# Patient Record
Sex: Female | Born: 1960
Health system: Southern US, Community
[De-identification: ages and names within clinical notes are randomized; demographics above are authoritative.]

## PROBLEM LIST (undated history)

## (undated) ENCOUNTER — Ambulatory Visit: Admission: EM | Payer: Medicare HMO | Source: Home / Self Care

## (undated) ENCOUNTER — Emergency Department (HOSPITAL_COMMUNITY): Payer: Medicare HMO

## (undated) DIAGNOSIS — M419 Scoliosis, unspecified: Secondary | ICD-10-CM

## (undated) DIAGNOSIS — F32A Depression, unspecified: Secondary | ICD-10-CM

## (undated) DIAGNOSIS — R51 Headache: Secondary | ICD-10-CM

## (undated) DIAGNOSIS — R519 Headache, unspecified: Secondary | ICD-10-CM

## (undated) DIAGNOSIS — M1990 Unspecified osteoarthritis, unspecified site: Secondary | ICD-10-CM

## (undated) DIAGNOSIS — I499 Cardiac arrhythmia, unspecified: Secondary | ICD-10-CM

## (undated) DIAGNOSIS — C801 Malignant (primary) neoplasm, unspecified: Secondary | ICD-10-CM

## (undated) DIAGNOSIS — F329 Major depressive disorder, single episode, unspecified: Secondary | ICD-10-CM

## (undated) DIAGNOSIS — C50919 Malignant neoplasm of unspecified site of unspecified female breast: Secondary | ICD-10-CM

## (undated) DIAGNOSIS — D649 Anemia, unspecified: Secondary | ICD-10-CM

## (undated) DIAGNOSIS — Q433 Congenital malformations of intestinal fixation: Secondary | ICD-10-CM

## (undated) HISTORY — PX: ERCP: SHX60

## (undated) HISTORY — DX: Scoliosis, unspecified: M41.9

## (undated) HISTORY — PX: COLONOSCOPY: SHX174

## (undated) HISTORY — PX: ANKLE SURGERY: SHX546

## (undated) HISTORY — DX: Malignant (primary) neoplasm, unspecified: C80.1

## (undated) HISTORY — PX: EYE SURGERY: SHX253

## (undated) HISTORY — PX: TUBAL LIGATION: SHX77

## (undated) HISTORY — PX: DILATION AND CURETTAGE OF UTERUS: SHX78

## (undated) HISTORY — PX: BREAST SURGERY: SHX581

## (undated) HISTORY — PX: BREAST EXCISIONAL BIOPSY: SUR124

---

## 1997-09-05 HISTORY — PX: CERVICAL DISC SURGERY: SHX588

## 1998-07-02 ENCOUNTER — Encounter: Payer: Self-pay | Admitting: Neurosurgery

## 1998-07-03 ENCOUNTER — Encounter: Payer: Self-pay | Admitting: Neurosurgery

## 1998-07-03 ENCOUNTER — Ambulatory Visit (HOSPITAL_COMMUNITY): Admission: RE | Admit: 1998-07-03 | Discharge: 1998-07-04 | Payer: Self-pay | Admitting: Neurosurgery

## 1998-09-05 HISTORY — PX: BACK SURGERY: SHX140

## 1998-10-27 ENCOUNTER — Ambulatory Visit (HOSPITAL_COMMUNITY): Admission: RE | Admit: 1998-10-27 | Discharge: 1998-10-27 | Payer: Self-pay | Admitting: Neurosurgery

## 1998-10-27 ENCOUNTER — Encounter: Payer: Self-pay | Admitting: Neurosurgery

## 1998-11-27 ENCOUNTER — Inpatient Hospital Stay (HOSPITAL_COMMUNITY): Admission: RE | Admit: 1998-11-27 | Discharge: 1998-11-30 | Payer: Self-pay | Admitting: Neurosurgery

## 1998-11-27 ENCOUNTER — Encounter: Payer: Self-pay | Admitting: Neurosurgery

## 1998-11-29 ENCOUNTER — Encounter: Payer: Self-pay | Admitting: Neurological Surgery

## 1999-06-15 ENCOUNTER — Encounter: Payer: Self-pay | Admitting: Neurosurgery

## 1999-06-15 ENCOUNTER — Ambulatory Visit (HOSPITAL_COMMUNITY): Admission: RE | Admit: 1999-06-15 | Discharge: 1999-06-15 | Payer: Self-pay | Admitting: Neurosurgery

## 2001-11-27 ENCOUNTER — Other Ambulatory Visit: Admission: RE | Admit: 2001-11-27 | Discharge: 2001-11-27 | Payer: Self-pay | Admitting: Internal Medicine

## 2001-11-28 ENCOUNTER — Ambulatory Visit (HOSPITAL_COMMUNITY): Admission: RE | Admit: 2001-11-28 | Discharge: 2001-11-28 | Payer: Self-pay | Admitting: Internal Medicine

## 2001-11-28 ENCOUNTER — Encounter: Payer: Self-pay | Admitting: Internal Medicine

## 2001-12-14 ENCOUNTER — Encounter: Payer: Self-pay | Admitting: Family Medicine

## 2001-12-14 ENCOUNTER — Ambulatory Visit (HOSPITAL_COMMUNITY): Admission: RE | Admit: 2001-12-14 | Discharge: 2001-12-14 | Payer: Self-pay | Admitting: Family Medicine

## 2002-10-08 ENCOUNTER — Ambulatory Visit (HOSPITAL_COMMUNITY): Admission: RE | Admit: 2002-10-08 | Discharge: 2002-10-08 | Payer: Self-pay | Admitting: Family Medicine

## 2002-10-08 ENCOUNTER — Encounter: Payer: Self-pay | Admitting: Family Medicine

## 2002-12-24 ENCOUNTER — Ambulatory Visit (HOSPITAL_COMMUNITY): Admission: RE | Admit: 2002-12-24 | Discharge: 2002-12-24 | Payer: Self-pay | Admitting: Internal Medicine

## 2002-12-24 ENCOUNTER — Encounter: Payer: Self-pay | Admitting: Internal Medicine

## 2003-01-16 ENCOUNTER — Encounter: Payer: Self-pay | Admitting: Family Medicine

## 2003-01-16 ENCOUNTER — Ambulatory Visit (HOSPITAL_COMMUNITY): Admission: RE | Admit: 2003-01-16 | Discharge: 2003-01-16 | Payer: Self-pay | Admitting: Family Medicine

## 2003-04-02 ENCOUNTER — Ambulatory Visit (HOSPITAL_COMMUNITY): Admission: RE | Admit: 2003-04-02 | Discharge: 2003-04-02 | Payer: Self-pay | Admitting: Internal Medicine

## 2003-04-02 ENCOUNTER — Encounter (INDEPENDENT_AMBULATORY_CARE_PROVIDER_SITE_OTHER): Payer: Self-pay | Admitting: Internal Medicine

## 2003-04-09 ENCOUNTER — Ambulatory Visit (HOSPITAL_COMMUNITY): Admission: RE | Admit: 2003-04-09 | Discharge: 2003-04-09 | Payer: Self-pay | Admitting: Internal Medicine

## 2003-07-16 ENCOUNTER — Ambulatory Visit (HOSPITAL_COMMUNITY): Admission: RE | Admit: 2003-07-16 | Discharge: 2003-07-16 | Payer: Self-pay | Admitting: Internal Medicine

## 2003-08-06 ENCOUNTER — Ambulatory Visit (HOSPITAL_COMMUNITY): Admission: RE | Admit: 2003-08-06 | Discharge: 2003-08-06 | Payer: Self-pay | Admitting: Internal Medicine

## 2004-06-04 ENCOUNTER — Ambulatory Visit (HOSPITAL_COMMUNITY): Admission: RE | Admit: 2004-06-04 | Discharge: 2004-06-04 | Payer: Self-pay | Admitting: Family Medicine

## 2004-06-15 ENCOUNTER — Ambulatory Visit (HOSPITAL_COMMUNITY): Admission: RE | Admit: 2004-06-15 | Discharge: 2004-06-15 | Payer: Self-pay | Admitting: General Surgery

## 2004-07-20 ENCOUNTER — Ambulatory Visit (HOSPITAL_COMMUNITY): Admission: RE | Admit: 2004-07-20 | Discharge: 2004-07-20 | Payer: Self-pay | Admitting: Internal Medicine

## 2004-09-11 ENCOUNTER — Ambulatory Visit (HOSPITAL_COMMUNITY): Admission: RE | Admit: 2004-09-11 | Discharge: 2004-09-11 | Payer: Self-pay | Admitting: Neurosurgery

## 2004-09-28 ENCOUNTER — Ambulatory Visit (HOSPITAL_COMMUNITY): Admission: RE | Admit: 2004-09-28 | Discharge: 2004-09-29 | Payer: Self-pay | Admitting: Neurosurgery

## 2005-03-07 ENCOUNTER — Ambulatory Visit (HOSPITAL_COMMUNITY): Admission: RE | Admit: 2005-03-07 | Discharge: 2005-03-07 | Payer: Self-pay | Admitting: Neurosurgery

## 2006-03-24 ENCOUNTER — Ambulatory Visit (HOSPITAL_COMMUNITY): Admission: RE | Admit: 2006-03-24 | Discharge: 2006-03-24 | Payer: Self-pay | Admitting: Family Medicine

## 2007-09-11 ENCOUNTER — Other Ambulatory Visit: Admission: RE | Admit: 2007-09-11 | Discharge: 2007-09-11 | Payer: Self-pay | Admitting: Obstetrics and Gynecology

## 2007-09-12 ENCOUNTER — Ambulatory Visit (HOSPITAL_COMMUNITY): Admission: RE | Admit: 2007-09-12 | Discharge: 2007-09-12 | Payer: Self-pay | Admitting: Obstetrics and Gynecology

## 2008-03-17 ENCOUNTER — Ambulatory Visit (HOSPITAL_COMMUNITY): Admission: RE | Admit: 2008-03-17 | Discharge: 2008-03-17 | Payer: Self-pay | Admitting: Obstetrics and Gynecology

## 2008-04-15 ENCOUNTER — Ambulatory Visit (HOSPITAL_COMMUNITY): Admission: RE | Admit: 2008-04-15 | Discharge: 2008-04-15 | Payer: Self-pay | Admitting: Family Medicine

## 2008-05-15 ENCOUNTER — Ambulatory Visit: Payer: Self-pay | Admitting: Gastroenterology

## 2008-05-26 ENCOUNTER — Ambulatory Visit (HOSPITAL_COMMUNITY): Admission: RE | Admit: 2008-05-26 | Discharge: 2008-05-26 | Payer: Self-pay | Admitting: Gastroenterology

## 2008-05-26 ENCOUNTER — Encounter: Payer: Self-pay | Admitting: Gastroenterology

## 2008-05-26 ENCOUNTER — Ambulatory Visit: Payer: Self-pay | Admitting: Gastroenterology

## 2008-07-16 ENCOUNTER — Ambulatory Visit (HOSPITAL_COMMUNITY): Admission: RE | Admit: 2008-07-16 | Discharge: 2008-07-16 | Payer: Self-pay | Admitting: Family Medicine

## 2008-07-23 ENCOUNTER — Encounter: Payer: Self-pay | Admitting: Gastroenterology

## 2008-07-23 LAB — CONVERTED CEMR LAB
Ferritin: 14 ng/mL (ref 10–291)
HCT: 35.7 % — ABNORMAL LOW (ref 36.0–46.0)
Hemoglobin: 11.4 g/dL — ABNORMAL LOW (ref 12.0–15.0)

## 2008-07-29 ENCOUNTER — Ambulatory Visit: Payer: Self-pay | Admitting: Gastroenterology

## 2008-07-30 ENCOUNTER — Ambulatory Visit (HOSPITAL_COMMUNITY): Admission: RE | Admit: 2008-07-30 | Discharge: 2008-07-30 | Payer: Self-pay | Admitting: Gastroenterology

## 2008-08-15 ENCOUNTER — Encounter (HOSPITAL_COMMUNITY): Admission: RE | Admit: 2008-08-15 | Discharge: 2008-09-14 | Payer: Self-pay | Admitting: Neurosurgery

## 2009-02-24 ENCOUNTER — Other Ambulatory Visit: Admission: RE | Admit: 2009-02-24 | Discharge: 2009-02-24 | Payer: Self-pay | Admitting: Obstetrics and Gynecology

## 2009-02-25 ENCOUNTER — Ambulatory Visit (HOSPITAL_COMMUNITY): Admission: RE | Admit: 2009-02-25 | Discharge: 2009-02-25 | Payer: Self-pay | Admitting: Family Medicine

## 2010-05-19 ENCOUNTER — Ambulatory Visit (HOSPITAL_COMMUNITY): Admission: RE | Admit: 2010-05-19 | Discharge: 2010-05-19 | Payer: Self-pay | Admitting: Internal Medicine

## 2010-09-26 ENCOUNTER — Encounter: Payer: Self-pay | Admitting: Family Medicine

## 2010-09-27 ENCOUNTER — Encounter: Payer: Self-pay | Admitting: Family Medicine

## 2011-01-18 NOTE — Consult Note (Signed)
NAME:  [name], [name]                    ACCOUNT NO.:  [id number]   MEDICAL RECORD NO.:  [id number]          PATIENT TYPE:  AMB   LOCATION:  DAY                           FACILITY:  [facility]   PHYSICIAN:  Kassie Mends, M.D.      DATE OF BIRTH:  08/02/1961   DATE OF CONSULTATION:  05/15/2008  DATE OF DISCHARGE:                                 CONSULTATION   REFERRING PHYSICIAN:  Patrica Duel, MD   REASON FOR CONSULTATION:  Anemia.   HISTORY OF PRESENT ILLNESS:  [name] is a 50 year old female who has  been seen in our clinic since 1999.  Her last visit was in 2004.  She  has had multiple upper endoscopies for recurrent epigastric pain.  She  had a hemoglobin and hematocrit in 2004 which is 10.9 and 32.5.  She  continues to have menstrual cycles.  In 2004, she did have a prominent  fold of the duodenal bulb.  A subsequent EGD reveals a normal duodenal  bulb and most likely hiatal hernia.  She presents today because Dr.  Nobie Putnam checked her hemoglobin in August 2009 and it was 11.2 with a  hematocrit 35.3 and MCV of 93.1.  She had a ferritin level in July 2009,  which was 11, consistent with a low normal iron level.  Her TIBC was  normal.  She had a hemoglobin of 11.8 in July 2007.  She has been taking  iron for, but stopped it approximately 2 weeks ago, because it made her  feel funny and bad.  She takes it off and on depending on whether or not  she feels drained.  She denies any bright red blood per rectum or black  tarry stools.  Her last menstrual period was April 28, 2008.  She  continues to have regular cycles.  She bleeds for 4-5 days.  She has 3  heavy days of bleeding.  She uses Advil once a week.  She uses BC  powders twice a week for headaches.  She denies any [employer] use.  She has not had any change in her bowel habits, constipation, diarrhea,  abdominal pain, or weight loss.  Her appetite is too good.   PAST MEDICAL HISTORY:  1. Neck pain.  2. Headaches.   PAST  HISTORY:  1. Neck surgery twice.  2. Low back surgery once.   ALLERGIES:  No known drug allergies.   MEDICATIONS:  1. Flexeril.  2. Xanax 2-3 per day.  3. Hydrocodone/acetaminophen 10/325 two to three per day.   FAMILY HISTORY:  She denies any family history of colon cancer or colon  polyps.   SOCIAL HISTORY:  She is married and disabled from her neck injury on the  job.  She denies any tobacco or alcohol use.   REVIEW OF SYSTEMS:  As per the HPI, otherwise all systems are negative.   PHYSICAL EXAMINATION:  VITAL SIGNS:  Weight 168 pounds, height 5 feet 7  inches, BMI 26.3 (slightly overweight), temperature 98, blood pressure  120/78, and pulse 68.GENERAL:  She is in no  apparent distress, alert and  oriented x4.  HEENT:  Atraumatic, normocephalic.  Pupils equal and react to light.  Mouth, no oral lesions.  Posterior pharynx without erythema or  exudate.LUNGS:  Clear to auscultation bilaterally.CARDIOVASCULAR:  Regular rhythm.  No murmur.  Normal S1 and S2.ABDOMEN:  Bowel sounds  present.  Soft, nontender, and nondistended.  No rebound or  guarding.EXTREMITIES:  No cyanosis or edema.NEURO:  No focal neurologic  deficits.   LABS:  On July 2009, creatinine 0.55, alk phos 66, AST 21, ALT 16,  albumin 4.0, and TSH 3.501.   ASSESSMENT:  [name] is a 50 year old female who has a low normal  ferritin and a normocytic anemia.  Most likely etiology for her  normocytic anemia is menstrual blood loss complicated by likely occult  blood loss from her GI tract due to NSAID gastritis, NSAID ulcer,  colorectal polyp, or low likelihood of colorectal malignancy.  Thank you  for allowing me to see [name] in consultation.  My recommendations are  as follow.   RECOMMENDATIONS:  1. She was scheduled for colonoscopy and if no source for her anemia      can be found, then she will be scheduled for an EGD.  She was given      the HalfLytely bowel prep.  2. She reports having a colonoscopy  around 2005 and will obtain the      colonoscopy report from [facility].  She will be scheduled a follow      up based on the findings of endoscopy.      Kassie Mends, M.D.  Electronically Signed     SM/MEDQ  D:  05/16/2008  T:  05/16/2008  Job:  [id number]   cc:   Patrica Duel, M.D.  Fax: [phone]

## 2011-01-18 NOTE — Op Note (Signed)
[name], [name]                    ACCOUNT NO.:  [id number]   MEDICAL RECORD NO.:  [id number]          PATIENT TYPE:  AMB   LOCATION:  DAY                           FACILITY:  [facility]   PHYSICIAN:  Kassie Mends, M.D.      DATE OF BIRTH:  04-Jan-1961   DATE OF PROCEDURE:  05/26/2008  DATE OF DISCHARGE:                               OPERATIVE REPORT   PRIMARY CARE PHYSICIAN:  Patrica Duel, MD   PROCEDURE:  1. Colonoscopy.  2. Esophagogastroduodenoscopy with cold forceps biopsy.   INDICATION FOR EXAM:  [name] is a 50 year old female who presents with  iron-deficiency anemia.  She admits to taking Advil once a week and BC  Powder twice a week for headaches.  She continues to have irregular  cycles and has heavy bleeding 3/5 days.  She is intermittently taking  iron because she says it makes her feel funny.   FINDINGS:  1. Tortuous sigmoid colon which would not allow for successful      intubation of the distal terminal ileum.  Ascending colon      diverticula, rare.  Otherwise, no polyps, masses, inflammatory      changes, or AVM seen.  2. Small internal hemorrhoids.  Otherwise normal retroflexed view of      the rectum.  3. Widely patent Schatzki ring in the distal esophagus without      evidence of Barrett mass, erosion, or ulceration.  4. Sliding hiatal hernia.  Multiple linear erosions seen in the mid      body and antrum.  She had a J-shaped stomach, which displaced the      pylorus to the left and advancing the scope into the second portion      of the duodenum challenging.  Biopsies were obtained via cold      forceps to evaluate for H. pylori gastritis.  5. Normal duodenal bulb and second portion of the duodenum with      moderate bile staining.   DIAGNOSES:  1. Iron-deficiency anemia, likely multifactorial.  The anemia is most      likely secondary to gastritis and menstrual blood loss.  2. Small hiatal hernia and Schatzki's ring.  3. Small internal hemorrhoids.  4.  Ascending colon diiverticulosis   RECOMMENDATIONS:  1. She should avoid aspirin, NSAIDs for 30 days.  2. She should begin Prilosec 20 mg daily.  3. She has a follow up appointment to see me in 2 months regarding      gastritis and her anemia.  If she continues with her anemia, then      we would consider Bifera or IV iron.  4. She should avoid gastric irritants.  She is given a handout on      gastric irritants, diverticulosis, hemorrhoids, and gastritis.  5. No anticoagulation for 5 days.  6. Screening colonoscopy in 10 years.   MEDICATIONS:  1. Demerol 100 mg IV.  2. Versed 6 mg IV.  3. Phenergan 12.5 mg IV.   PROCEDURE TECHNIQUE:  Physical exam was performed.  Informed consent was  obtained  from the patient after explaining the benefits, risks, and  alternatives to the procedure.  The patient was connected to the monitor  and placed in left lateral position.  Continuous oxygen was provided by  nasal cannula and IV medicine administered through an indwelling  cannula.  After administration of sedation and rectal exam, the  patient's rectum was intubated and scope was advanced under direct  visualization to the cecum.  The scope was removed slowly by carefully  examining the color, texture, anatomy, and integrity of the mucosa on  the way out.   After the colonoscopy, the patient's esophagus was intubated with a  diagnostic gastroscope and the scope was advanced under direct  visualization to the second portion of the duodenum.  The scope was  removed slowly by carefully examining the color, texture, anatomy, and  integrity of the mucosa on the way out.  The patient was recovered in  endoscopy and discharged home in satisfactory condition.      Kassie Mends, M.D.  Electronically Signed     SM/MEDQ  D:  05/26/2008  T:  05/27/2008  Job:  [id number]   cc:   Patrica Duel, M.D.  Fax: [phone]

## 2011-01-18 NOTE — Assessment & Plan Note (Signed)
[name], [name]                     CHART#:  [id number]   DATE:  07/29/2008                       DOB:  [birth year]   REFERRING PHYSICIAN:  Milus Mallick. Lodema Hong, MD   PROBLEM LIST:  1. Normocytic anemia with low normal ferritin, mild taking Advil and      BC powders and having heavy menstrual cycles.  2. Headaches.  3. Back pain.  4. Ascending colon divertriculosis   SUBJECTIVE:  The patient is a 50 year old female, who was last seen and  evaluated in September 2009.  She had an upper endoscopy which showed no  evidence of H. pylori gastritis.  She also had a colonoscopy which only  revealed ascending colon diverticula.  She had a hemoglobin measured in  November 2009 which was 11.4 with a ferritin of 14 (10 - 291).  She does  not take iron on a regular basis because it makes her feel funny.  Her  prior labs showed a ferritin of 11 (10 - 1091).  She has not seen any  evidence of active bleeding.  Hemoglobin was slightly improved from July  to November from 10.6-11.4.  She continues to be anemic.  She is taking  the iron every other day.  She denies any Advil and BC powder use.  She  still has the cycle monthly and has 3 out of 5 days of heavy bleeding.  She was started on oxycodone for her back pain and now is having  constipation.  She has only one bowel movement a week.  She has been  taking stool softeners.   MEDICATIONS:  1. Flexeril 10 mg 2-3 times a day.  2. Xanax.  3. Hydrocodone.  4. Iron.  5. Oxycodone.  6. Stool softener.  7. Prilosec daily.   OBJECTIVE:  VITAL SIGNS:  Weight 167 (unchanged since September 2009),  height 5 feet 7 inches, temperature 98.4, blood pressure 132/80, and  pulse 104 (the patient having back pain).GENERAL:  She is in no apparent  distress.  Alert and oriented x4.LUNGS:  Clear to auscultation  bilaterally.CARDIOVASCULAR:  Regular rhythm.  No murmur.  Normal S1 and  S2.ABDOMEN:  Bowel sounds are present, soft, nontender, and  nondistended.   No rebound or guarding.NEUROLOGIC:  She has no focal  neurologic deficits.   ASSESSMENT:  The patient is a 50 year old female, who has had a workup  for normocytic anemia with a low normal ferritin.  She likely has border  normocytic anemia secondary to menstrual blood loss which may be  exacerbated by gastrointestinal blood loss from nonsteroidal  antiinflammatory drug use.  She is now avoiding Advil and BCs. Thank you  for allowing me to see the patient in consultation.  She also is  complaining of constipation most likely secondary to narcotic use.  She  is requesting a better bowel regimen. My recommendations follow.   RECOMMENDATIONS:  1. She is given discharge instructions in writing for the management      of her constipation.  She is asked to drink at least 6-8 cups of      water a day.  She is to start on high-fiber diet.  She is given a      handout on high-fiber diet.  If fiber items cause bloating, she      should  avoid them.  2. She should add fiber to always 2 times a day to achieve one bowel      movement every 2-3 days.  3. If she is not having a bowel movement every 2-3 days after 2 weeks      of high-fiber diet and FiberChoice, then she should add MiraLax      once or twice a day.  4. Will also check a small bowel follow-through to evaluate for small      bowel mass as an etiology for her low normal ferritin/normocytic      anemia.  5. Follow up appointment in 3 months and will recheck her      hemoglobin/hematocrit and ferritin.       Kassie Mends, M.D.  Electronically Signed     SM/MEDQ  D:  07/29/2008  T:  07/29/2008  Job:  [id number]   cc:   Milus Mallick. Lodema Hong, M.D.

## 2011-01-21 NOTE — H&P (Signed)
[name], [name]                    ACCOUNT NO.:  [id number]   MEDICAL RECORD NO.:  [id number]           PATIENT TYPE:   LOCATION:                                 FACILITY:   PHYSICIAN:  Dalia Heading, M.D.  DATE OF BIRTH:  Nov 30, 1960   DATE OF ADMISSION:  06/15/2004  DATE OF DISCHARGE:  LH                                HISTORY & PHYSICAL   CHIEF COMPLAINT:  Left lower quadrant abdominal pain of unknown etiology.   HISTORY OF PRESENT ILLNESS:  The patient is a 50 year old black female who  was referred for endoscopic evaluation.  She needs colonoscopy for left  lower quadrant abdominal pain.  It has been present for the last three  weeks.  Ultrasound of the pelvis shows a fibroid uterus.  No weight loss,  nausea, vomiting, diarrhea, constipation, melena or hematochezia have been  noted.  She has never had a colonoscopy.  There is no family history of  colon carcinoma.   PAST MEDICAL HISTORY:  Back pain.   PAST SURGICAL HISTORY:  Neck surgery.   CURRENT MEDICATIONS:  Vicodin, Flexeril, Xanax and Skelaxin.   ALLERGIES:  No known drug allergies.   REVIEW OF SYSTEMS:  Noncontributory.   PHYSICAL EXAMINATION:  GENERAL APPEARANCE:  The patient is a well-developed,  well-nourished, black female in no acute distress.  VITAL SIGNS:  She is afebrile and vital signs are stable.  LUNGS:  Clear to auscultation with equal breath sounds bilaterally.  HEART:  Regular rate and rhythm without S3, S4 or murmurs.  ABDOMEN:  Soft and nontender.  No hepatosplenomegaly or masses are noted.  RECTAL:  Examination was deferred to the procedure.   IMPRESSION:  Left lower quadrant abdominal pain of unknown etiology.   PLAN:  The patient is scheduled for a colonoscopy on June 15, 2004.  The  risks and benefits of the procedure, including bleeding and perforation,  were fully explained to the patient, who gave informed consent.     Mark   MAJ/MEDQ  D:  06/10/2004  T:  06/10/2004  Job:   [id number]   cc:   Patrica Duel, M.D.  [address]  [place]  [place] [address]  Fax: [phone]

## 2011-02-14 ENCOUNTER — Other Ambulatory Visit (HOSPITAL_COMMUNITY): Payer: Self-pay | Admitting: Internal Medicine

## 2011-02-14 ENCOUNTER — Ambulatory Visit (HOSPITAL_COMMUNITY)
Admission: RE | Admit: 2011-02-14 | Discharge: 2011-02-14 | Disposition: A | Discharge status: Home / Self Care | Payer: BC Managed Care – PPO | Source: Ambulatory Visit | Attending: Internal Medicine | Admitting: Internal Medicine

## 2011-02-14 DIAGNOSIS — M545 Low back pain, unspecified: Secondary | ICD-10-CM

## 2011-02-14 DIAGNOSIS — M47817 Spondylosis without myelopathy or radiculopathy, lumbosacral region: Secondary | ICD-10-CM | POA: Insufficient documentation

## 2011-02-16 ENCOUNTER — Other Ambulatory Visit (HOSPITAL_COMMUNITY): Payer: Self-pay | Admitting: Internal Medicine

## 2011-02-16 DIAGNOSIS — M545 Low back pain, unspecified: Secondary | ICD-10-CM

## 2011-02-18 ENCOUNTER — Ambulatory Visit (HOSPITAL_COMMUNITY)
Admission: RE | Admit: 2011-02-18 | Discharge: 2011-02-18 | Disposition: A | Discharge status: Home / Self Care | Payer: BC Managed Care – PPO | Source: Ambulatory Visit | Attending: Internal Medicine | Admitting: Internal Medicine

## 2011-02-18 DIAGNOSIS — M5126 Other intervertebral disc displacement, lumbar region: Secondary | ICD-10-CM | POA: Insufficient documentation

## 2011-02-18 DIAGNOSIS — M545 Low back pain, unspecified: Secondary | ICD-10-CM | POA: Insufficient documentation

## 2011-04-26 ENCOUNTER — Other Ambulatory Visit: Payer: Self-pay | Admitting: Obstetrics and Gynecology

## 2011-04-26 ENCOUNTER — Other Ambulatory Visit (HOSPITAL_COMMUNITY)
Admission: RE | Admit: 2011-04-26 | Discharge: 2011-04-26 | Disposition: A | Discharge status: Home / Self Care | Payer: BC Managed Care – PPO | Source: Ambulatory Visit | Attending: Obstetrics and Gynecology | Admitting: Obstetrics and Gynecology

## 2011-04-26 DIAGNOSIS — Z124 Encounter for screening for malignant neoplasm of cervix: Secondary | ICD-10-CM | POA: Insufficient documentation

## 2011-05-02 ENCOUNTER — Other Ambulatory Visit: Payer: Self-pay | Admitting: Obstetrics and Gynecology

## 2011-05-02 DIAGNOSIS — Z139 Encounter for screening, unspecified: Secondary | ICD-10-CM

## 2011-05-06 ENCOUNTER — Ambulatory Visit (HOSPITAL_COMMUNITY)
Admission: RE | Admit: 2011-05-06 | Discharge: 2011-05-06 | Disposition: A | Discharge status: Home / Self Care | Payer: BC Managed Care – PPO | Source: Ambulatory Visit | Attending: Obstetrics and Gynecology | Admitting: Obstetrics and Gynecology

## 2011-05-06 DIAGNOSIS — Z139 Encounter for screening, unspecified: Secondary | ICD-10-CM

## 2011-05-06 DIAGNOSIS — Z1231 Encounter for screening mammogram for malignant neoplasm of breast: Secondary | ICD-10-CM | POA: Insufficient documentation

## 2011-10-03 DIAGNOSIS — M542 Cervicalgia: Secondary | ICD-10-CM | POA: Diagnosis not present

## 2011-10-03 DIAGNOSIS — F411 Generalized anxiety disorder: Secondary | ICD-10-CM | POA: Diagnosis not present

## 2011-10-03 DIAGNOSIS — Z6825 Body mass index (BMI) 25.0-25.9, adult: Secondary | ICD-10-CM | POA: Diagnosis not present

## 2011-10-03 DIAGNOSIS — G8929 Other chronic pain: Secondary | ICD-10-CM | POA: Diagnosis not present

## 2011-11-25 DIAGNOSIS — Z6825 Body mass index (BMI) 25.0-25.9, adult: Secondary | ICD-10-CM | POA: Diagnosis not present

## 2011-11-25 DIAGNOSIS — M549 Dorsalgia, unspecified: Secondary | ICD-10-CM | POA: Diagnosis not present

## 2011-11-25 DIAGNOSIS — G8929 Other chronic pain: Secondary | ICD-10-CM | POA: Diagnosis not present

## 2012-01-02 DIAGNOSIS — G8929 Other chronic pain: Secondary | ICD-10-CM | POA: Diagnosis not present

## 2012-01-02 DIAGNOSIS — Z0000 Encounter for general adult medical examination without abnormal findings: Secondary | ICD-10-CM | POA: Diagnosis not present

## 2012-01-02 DIAGNOSIS — F411 Generalized anxiety disorder: Secondary | ICD-10-CM | POA: Diagnosis not present

## 2012-01-02 DIAGNOSIS — Z6825 Body mass index (BMI) 25.0-25.9, adult: Secondary | ICD-10-CM | POA: Diagnosis not present

## 2012-01-04 ENCOUNTER — Ambulatory Visit (HOSPITAL_COMMUNITY)
Admission: RE | Admit: 2012-01-04 | Discharge: 2012-01-04 | Disposition: A | Discharge status: Home / Self Care | Payer: BC Managed Care – PPO | Source: Ambulatory Visit | Attending: Family Medicine | Admitting: Family Medicine

## 2012-01-04 ENCOUNTER — Other Ambulatory Visit (HOSPITAL_COMMUNITY): Payer: Self-pay | Admitting: Family Medicine

## 2012-01-04 DIAGNOSIS — R7611 Nonspecific reaction to tuberculin skin test without active tuberculosis: Secondary | ICD-10-CM | POA: Insufficient documentation

## 2012-01-04 DIAGNOSIS — R059 Cough, unspecified: Secondary | ICD-10-CM | POA: Insufficient documentation

## 2012-01-04 DIAGNOSIS — Z6825 Body mass index (BMI) 25.0-25.9, adult: Secondary | ICD-10-CM | POA: Diagnosis not present

## 2012-01-04 DIAGNOSIS — R05 Cough: Secondary | ICD-10-CM | POA: Insufficient documentation

## 2012-01-11 DIAGNOSIS — Z0000 Encounter for general adult medical examination without abnormal findings: Secondary | ICD-10-CM | POA: Diagnosis not present

## 2012-04-10 DIAGNOSIS — I10 Essential (primary) hypertension: Secondary | ICD-10-CM | POA: Diagnosis not present

## 2012-04-10 DIAGNOSIS — IMO0002 Reserved for concepts with insufficient information to code with codable children: Secondary | ICD-10-CM | POA: Diagnosis not present

## 2012-04-10 DIAGNOSIS — G8929 Other chronic pain: Secondary | ICD-10-CM | POA: Diagnosis not present

## 2012-04-10 DIAGNOSIS — F411 Generalized anxiety disorder: Secondary | ICD-10-CM | POA: Diagnosis not present

## 2012-05-13 DIAGNOSIS — Z23 Encounter for immunization: Secondary | ICD-10-CM | POA: Diagnosis not present

## 2012-06-13 ENCOUNTER — Other Ambulatory Visit: Payer: Self-pay | Admitting: Obstetrics and Gynecology

## 2012-06-13 DIAGNOSIS — N949 Unspecified condition associated with female genital organs and menstrual cycle: Secondary | ICD-10-CM | POA: Diagnosis not present

## 2012-06-13 DIAGNOSIS — Z130 Encounter for screening for diseases of the blood and blood-forming organs and certain disorders involving the immune mechanism: Secondary | ICD-10-CM | POA: Diagnosis not present

## 2012-06-13 DIAGNOSIS — N938 Other specified abnormal uterine and vaginal bleeding: Secondary | ICD-10-CM | POA: Diagnosis not present

## 2012-06-13 DIAGNOSIS — D259 Leiomyoma of uterus, unspecified: Secondary | ICD-10-CM | POA: Diagnosis not present

## 2012-06-25 ENCOUNTER — Encounter (HOSPITAL_COMMUNITY): Payer: Self-pay

## 2012-07-02 NOTE — Patient Instructions (Signed)
20 ANOUSHKA DIVITO  07/02/2012   Your procedure is scheduled on:  07/10/12  Report to [facility] at 06:15 AM.  Call this number if you have problems the morning of surgery: [phone]   Remember:   Do not eat or drink:After Midnight.  Take these medicines the morning of surgery with A SIP OF WATER: Xanax and Hydrocodone  only if needed.   Do not wear jewelry, make-up or nail polish.  Do not wear lotions, powders, or perfumes.   Do not shave 48 hours prior to surgery. Men may shave face and neck.  Do not bring valuables to the hospital.  Contacts, dentures or bridgework may not be worn into surgery.  Leave suitcase in the car. After surgery it may be brought to your room.  For patients admitted to the hospital, checkout time is 11:00 AM the day of discharge.   Patients discharged the day of surgery will not be allowed to drive home.   Special Instructions: Shower using CHG 2 nights before surgery and the night before surgery.  If you shower the day of surgery use CHG.  Use special wash - you have one bottle of CHG for all showers.  You should use approximately 1/3 of the bottle for each shower.   Please read over the following fact sheets that you were given: Pain Booklet, MRSA Information, Surgical Site Infection Prevention, Anesthesia Post-op Instructions and Care and Recovery After Surgery    Hysteroscopy Hysteroscopy is a procedure used for looking inside the womb (uterus). It may be done for many different reasons, including:  To evaluate abnormal bleeding, fibroid (benign, noncancerous) tumors, polyps, scar tissue (adhesions), and possibly cancer of the uterus.  To look for lumps (tumors) and other uterine growths.  To look for causes of why a woman cannot get pregnant (infertility), causes of recurrent loss of pregnancy (miscarriages), or a lost intrauterine device (IUD).  To perform a sterilization by blocking the fallopian tubes from inside the uterus. A hysteroscopy should be  done right after a menstrual period to be sure you are not pregnant. LET YOUR CAREGIVER KNOW ABOUT:   Allergies.  Medicines taken, including herbs, eyedrops, over-the-counter medicines, and creams.  Use of steroids (by mouth or creams).  Previous problems with anesthetics or numbing medicines.  History of bleeding or blood problems.  History of blood clots.  Possibility of pregnancy, if this applies.  Previous surgery.  Other health problems. RISKS AND COMPLICATIONS   Putting a hole in the uterus.  Excessive bleeding.  Infection.  Damage to the cervix.  Injury to other organs.  Allergic reaction to medicines.  Too much fluid used in the uterus for the procedure. BEFORE THE PROCEDURE   Do not take aspirin or blood thinners for a week before the procedure, or as directed. It can cause bleeding.  Arrive at least 60 minutes before the procedure or as directed to read and sign the necessary forms.  Arrange for someone to take you home after the procedure.  If you smoke, do not smoke for 2 weeks before the procedure. PROCEDURE   Your caregiver may give you medicine to relax you. He or she may also give you a medicine that numbs the area around the cervix (local anesthetic) or a medicine that makes you sleep (general anesthesia).  Sometimes, a medicine is placed in the cervix the day before the procedure. This medicine makes the cervix have a larger opening (dilate). This makes it easier for the instrument to  be inserted into the uterus.  A small instrument (hysteroscope) is inserted through the vagina into the uterus. This instrument is similar to a pencil-sized telescope with a light.  During the procedure, air or a liquid is put into the uterus, which allows the surgeon to see better.  Sometimes, tissue is gently scraped from inside the uterus. These tissue samples are sent to a specialist who looks at tissue samples (pathologist). The pathologist will give a report  to your caregiver. This will help your caregiver decide if further treatment is necessary. The report will also help your caregiver decide on the best treatment if the test comes back abnormal. AFTER THE PROCEDURE   If you had a general anesthetic, you may be groggy for a couple hours after the procedure.  If you had a local anesthetic, you will be advised to rest at the surgical center or caregiver's office until you are stable and feel ready to go home.  You may have some cramping for a couple days.  You may have bleeding, which varies from light spotting for a few days to menstrual-like bleeding for up to 3 to 7 days. This is normal.  Have someone take you home. FINDING OUT THE RESULTS OF YOUR TEST Not all test results are available during your visit. If your test results are not back during the visit, make an appointment with your caregiver to find out the results. Do not assume everything is normal if you have not heard from your caregiver or the medical facility. It is important for you to follow up on all of your test results. HOME CARE INSTRUCTIONS   Do not drive for 24 hours or as instructed.  Only take over-the-counter or prescription medicines for pain, discomfort, or fever as directed by your caregiver.  Do not take aspirin. It can cause or aggravate bleeding.  Do not drive or drink alcohol while taking pain medicine.  You may resume your usual diet.  Do not use tampons, douche, or have sexual intercourse for 2 weeks, or as advised by your caregiver.  Rest and sleep for the first 24 to 48 hours.  Take your temperature twice a day for 4 to 5 days. Write it down. Give these temperatures to your caregiver if they are abnormal (above 98.6 F or 37.0 C).  Take medicines your caregiver has ordered as directed.  Follow your caregiver's advice regarding diet, exercise, lifting, driving, and general activities.  Take showers instead of baths for 2 weeks, or as recommended by  your caregiver.  If you develop constipation:  Take a mild laxative with the advice of your caregiver.  Eat bran foods.  Drink enough water and fluids to keep your urine clear or pale yellow.  Try to have someone with you or available to you for the first 24 to 48 hours, especially if you had a general anesthetic.  Make sure you and your family understand everything about your operation and recovery.  Follow your caregiver's advice regarding follow-up appointments and Pap smears. SEEK MEDICAL CARE IF:   You feel dizzy or lightheaded.  You feel sick to your stomach (nauseous).  You develop abnormal vaginal discharge.  You develop a rash.  You have an abnormal reaction or allergy to your medicine.  You need stronger pain medicine. SEEK IMMEDIATE MEDICAL CARE IF:   Bleeding is heavier than a normal menstrual period or you have blood clots.  You have an oral temperature above 102 F (38.9 C), not controlled by  medicine.  You have increasing cramps or pains not relieved with medicine.  You develop belly (abdominal) pain that does not seem to be related to the same area of earlier cramping and pain.  You pass out.  You develop pain in the tops of your shoulders (shoulder strap areas).  You develop shortness of breath. MAKE SURE YOU:   Understand these instructions.  Will watch your condition.  Will get help right away if you are not doing well or get worse. Document Released: 11/28/2000 Document Revised: 11/14/2011 Document Reviewed: 03/23/2009 [facility] Patient Information 2013 [place], [place].    Dilation and Curettage or Vacuum Curettage Dilation and curettage (D&C) and vacuum curettage are minor procedures. A D&C involves stretching (dilation) the cervix and scraping (curettage) the inside lining of the womb (uterus). During a D&C, tissue is gently scraped from the inside lining of the uterus. During a vacuum curettage, the lining and tissue in the uterus are  removed with the use of gentle suction. Curettage may be performed for diagnostic or therapeutic purposes. As a diagnostic procedure, curettage is performed for the purpose of examining tissues from the uterus. Tissue examination may help determine causes or treatment options for symptoms. A diagnostic curettage may be performed for the following symptoms:  Irregular bleeding in the uterus.  Bleeding with the development of clots.  Spotting between menstrual periods.  Prolonged menstrual periods.  Bleeding after menopause.  No menstrual period (amenorrhea).  A change in size and shape of the uterus. A therapeutic curettage is performed to remove tissue, blood, or a contraceptive device. Therapeutic curettage may be performed for the following conditions:   Removal of an IUD (intrauterine device).  Removal of retained placenta after giving birth. Retained placenta can cause bleeding severe enough to require transfusions or an infection.  Abortion.  Miscarriage.  Removal of polyps inside the uterus.  Removal of uncommon types of fibroids (noncancerous lumps). LET YOUR CAREGIVER KNOW ABOUT:   Allergies to food or medicine.  Medicines taken, including vitamins, herbs, eyedrops, over-the-counter medicines, and creams.  Use of steroids (by mouth or creams).  Previous problems with anesthetics or numbing medicines.  History of bleeding problems or blood clots.  Previous surgery.  Other health problems, including diabetes and kidney problems.  Possibility of pregnancy, if this applies. RISKS AND COMPLICATIONS   Excessive bleeding.  Infection of the uterus.  Damage to the cervix.  Development of scar tissue (adhesions) inside the uterus, later causing abnormal amounts of menstrual bleeding.  Complications from the general anesthetic, if a general anesthetic is used.  Putting a hole (perforation) in the uterus. This is rare. BEFORE THE PROCEDURE   Eat and drink  before the procedure only as directed by your caregiver.  Arrange for someone to take you home. PROCEDURE   This procedure may be done in a hospital, outpatient clinic, or caregiver's office.  You may be given a general anesthetic or a local anesthetic in and around the cervix.  You will lie on your back with your legs in stirrups.  There are two ways in which your cervix can be softened and dilated. These include:  Taking a medicine.  Having thin rods (laminaria) inserted into your cervix.  A curved tool (curette) will scrape cells from the inside lining of the uterus and will then be removed. This procedure usually takes about 15 to 30 minutes. AFTER THE PROCEDURE   You will rest in the recovery area until you are stable and are ready to go  home.  You will need to have someone take you home.  You may feel sick to your stomach (nauseous) or throw up (vomit) if you had general anesthesia.  You may have a sore throat if a tube was placed in your throat during general anesthesia.  You may have light cramping and bleeding for 2 days to 2 weeks after the procedure.  Your uterus needs to make a new lining after the procedure. This may make your next period late. Document Released: 08/22/2005 Document Revised: 11/14/2011 Document Reviewed: 03/20/2009 [facility] Patient Information 2013 [place], [place].    PATIENT INSTRUCTIONS POST-ANESTHESIA  IMMEDIATELY FOLLOWING SURGERY:  Do not drive or operate machinery for the first twenty four hours after surgery.  Do not make any important decisions for twenty four hours after surgery or while taking narcotic pain medications or sedatives.  If you develop intractable nausea and vomiting or a severe headache please notify your doctor immediately.  FOLLOW-UP:  Please make an appointment with your surgeon as instructed. You do not need to follow up with anesthesia unless specifically instructed to do so.  WOUND CARE INSTRUCTIONS (if  applicable):  Keep a dry clean dressing on the anesthesia/puncture wound site if there is drainage.  Once the wound has quit draining you may leave it open to air.  Generally you should leave the bandage intact for twenty four hours unless there is drainage.  If the epidural site drains for more than 36-48 hours please call the anesthesia department.  QUESTIONS?:  Please feel free to call your physician or the hospital operator if you have any questions, and they will be happy to assist you.

## 2012-07-03 ENCOUNTER — Other Ambulatory Visit: Payer: Self-pay | Admitting: Obstetrics and Gynecology

## 2012-07-03 ENCOUNTER — Encounter (HOSPITAL_COMMUNITY): Payer: Self-pay

## 2012-07-03 ENCOUNTER — Encounter (HOSPITAL_COMMUNITY)
Admission: RE | Admit: 2012-07-03 | Discharge: 2012-07-03 | Disposition: A | Discharge status: Home / Self Care | Payer: BC Managed Care – PPO | Source: Ambulatory Visit | Attending: Obstetrics and Gynecology | Admitting: Obstetrics and Gynecology

## 2012-07-03 DIAGNOSIS — N920 Excessive and frequent menstruation with regular cycle: Secondary | ICD-10-CM | POA: Diagnosis not present

## 2012-07-03 DIAGNOSIS — D259 Leiomyoma of uterus, unspecified: Secondary | ICD-10-CM | POA: Diagnosis not present

## 2012-07-03 DIAGNOSIS — Z01812 Encounter for preprocedural laboratory examination: Secondary | ICD-10-CM | POA: Diagnosis not present

## 2012-07-03 DIAGNOSIS — Z01810 Encounter for preprocedural cardiovascular examination: Secondary | ICD-10-CM | POA: Diagnosis not present

## 2012-07-03 HISTORY — DX: Anemia, unspecified: D64.9

## 2012-07-03 HISTORY — DX: Unspecified osteoarthritis, unspecified site: M19.90

## 2012-07-03 LAB — COMPREHENSIVE METABOLIC PANEL
ALT: 20 U/L (ref 0–35)
AST: 22 U/L (ref 0–37)
Albumin: 3.5 g/dL (ref 3.5–5.2)
CO2: 26 mEq/L (ref 19–32)
Calcium: 9.7 mg/dL (ref 8.4–10.5)
GFR calc non Af Amer: >90 mL/min (ref >90)
Sodium: 136 mEq/L (ref 135–145)
Total Protein: 7.3 g/dL (ref 6.0–8.3)

## 2012-07-03 LAB — CBC
MCH: 29.8 pg (ref 26.0–34.0)
Platelets: 352 10*3/uL (ref 150–400)
RBC: 3.73 MIL/uL — ABNORMAL LOW (ref 3.87–5.11)
RDW: 17 % — ABNORMAL HIGH (ref 11.5–15.5)

## 2012-07-03 LAB — SURGICAL PCR SCREEN: MRSA, PCR: NEGATIVE

## 2012-07-03 NOTE — H&P (Signed)
[name] is an 51 y.o. female. She is admitted for hysteroscopy D&C and endometrial ablation. She has known uterine enlargement with small intramural fibroids. Ultrasound in November 2012 showed uterine enlargement and a posterior fibroid 3.5 cm, anterior fibroid 3.9 cm and 3 small fibroids. The endometrial cavity appears grossly normal left and right ovaries were normal. She finds the menstrual flow unacceptably heavy period in September she bled for 3 weeks. In August of that 4 days with irregular bleeding throughout this summer endometrial biopsy shows inactive endometrium with breakdown and degenerative changes no hyperplasia or carcinoma the patient is sufficiently frustrated with heavy periods that she wishes for interventions. Endometrial ablations is consider the least invasive option available to the patient  Pertinent Gynecological History: Menses: irregular occurring approximately every 30-60 days days without intermenstrual spotting Bleeding: dysfunctional uterine bleeding Contraception: tubal ligation DES exposure: unknown Blood transfusions: none Sexually transmitted diseases: no past history Previous GYN Procedures: Cesarean section x2 with tubal ligation and cesarean. Breast biopsy x1 left breast  Last mammogram: normal Date: 2012 Last pap: normal Date: 2012 August OB History: G2, P2 cesarean section x2   Menstrual History: Menarche age: 11 Patient's last menstrual period was 06/03/2012.    Past Medical History  Diagnosis Date  . Chronic neck pain   . Chronic back pain   . Anemia   . Arthritis     Past Surgical History  Procedure Date  . Cesarean section     x2  . Breast surgery     left partail mastectomy-cyst  . Ankle surgery     left ankle cyst  . Cervical disc surgery   . Back surgery     lumbar disc surgery  . Tubal ligation     with last c-section    No family history on file.  Social History:  reports that she has never smoked. She does not have  any smokeless tobacco history on file. She reports that she does not drink alcohol or use illicit drugs.  Allergies: No Known Allergies   (Not in a hospital admission)  Review of Systems  Constitutional: Chills: ..    Last menstrual period 06/03/2012. Physical Exam Physical Examination: General appearance - alert, well appearing, and in no distress, oriented to person, place, and time and normal appearing weight Mental status - normal mood, behavior, speech, dress, motor activity, and thought processes, anxious Eyes - pupils equal and reactive, extraocular eye movements intact Mouth - mucous membranes moist, pharynx normal without lesions Neck - supple, no significant adenopathy Chest - clear to auscultation, no wheezes, rales or rhonchi, symmetric air entry Heart - normal rate and regular rhythm Abdomen - soft, nontender, nondistended, no masses or organomegaly Pelvic - VULVA: normal appearing vulva with no masses, tenderness or lesions, VAGINA: normal appearing vagina with normal color and discharge, no lesions, CERVIX: normal appearing cervix without discharge or lesions, UTERUS: enlarged 10 week's size, dates and pelvis ADNEXA: normal adnexa in size, nontender and no masses, PAP: thin-prep method normal result August 2012, exam chaperoned by office nurse Extremities - peripheral pulses normal, no pedal edema, no clubbing or cyanosis   Results for orders placed during the hospital encounter of 07/03/12 (from the past 24 hour(s))  SURGICAL PCR SCREEN     Status: Normal   Collection Time   07/03/12  9:14 AM      Component Value Range   MRSA, PCR NEGATIVE  NEGATIVE   Staphylococcus aureus NEGATIVE  NEGATIVE  COMPREHENSIVE METABOLIC PANEL       Status: Abnormal   Collection Time   07/03/12  9:29 AM      Component Value Range   Sodium 136  135 - 145 mEq/L   Potassium 3.8  3.5 - 5.1 mEq/L   Chloride 100  96 - 112 mEq/L   CO2 26  19 - 32 mEq/L   Glucose, Bld 103 (*) 70 - 99 mg/dL    BUN 8  6 - 23 mg/dL   Creatinine, Ser 0.55  0.50 - 1.10 mg/dL   Calcium 9.7  8.4 - 10.5 mg/dL   Total Protein 7.3  6.0 - 8.3 g/dL   Albumin 3.5  3.5 - 5.2 g/dL   AST 22  0 - 37 U/L   ALT 20  0 - 35 U/L   Alkaline Phosphatase 76  39 - 117 U/L   Total Bilirubin 0.2 (*) 0.3 - 1.2 mg/dL   GFR calc non Af Amer >90  >90 mL/min   GFR calc Af Amer >90  >90 mL/min  CBC     Status: Abnormal   Collection Time   07/03/12  9:35 AM      Component Value Range   WBC 5.3  4.0 - 10.5 K/uL   RBC 3.73 (*) 3.87 - 5.11 MIL/uL   Hemoglobin 11.1 (*) 12.0 - 15.0 g/dL   HCT 33.6 (*) 36.0 - 46.0 %   MCV 90.1  78.0 - 100.0 fL   MCH 29.8  26.0 - 34.0 pg   MCHC 33.0  30.0 - 36.0 g/dL   RDW 17.0 (*) 11.5 - 15.5 %   Platelets 352  150 - 400 K/uL    No results found.  Assessment/Plan: Menorrhagia, in the perimenopausal age group Multiple small uterine fibroids Recent history of heart racing will obtain an EKG  Plan: Hysteroscopy D&C endometrial ablation 07/11/2011  Clydine Parkison V 07/03/2012, 5:00 PM  

## 2012-07-06 DIAGNOSIS — F411 Generalized anxiety disorder: Secondary | ICD-10-CM | POA: Diagnosis not present

## 2012-07-06 DIAGNOSIS — R002 Palpitations: Secondary | ICD-10-CM | POA: Diagnosis not present

## 2012-07-06 DIAGNOSIS — N958 Other specified menopausal and perimenopausal disorders: Secondary | ICD-10-CM | POA: Diagnosis not present

## 2012-07-06 DIAGNOSIS — G8929 Other chronic pain: Secondary | ICD-10-CM | POA: Diagnosis not present

## 2012-07-10 ENCOUNTER — Ambulatory Visit (HOSPITAL_COMMUNITY): Payer: BC Managed Care – PPO | Admitting: Anesthesiology

## 2012-07-10 ENCOUNTER — Encounter (HOSPITAL_COMMUNITY)
Admission: RE | Disposition: A | Discharge status: Home / Self Care | Payer: Self-pay | Source: Ambulatory Visit | Attending: Obstetrics and Gynecology

## 2012-07-10 ENCOUNTER — Ambulatory Visit (HOSPITAL_COMMUNITY)
Admission: RE | Admit: 2012-07-10 | Discharge: 2012-07-10 | Disposition: A | Discharge status: Home / Self Care | Payer: BC Managed Care – PPO | Source: Ambulatory Visit | Attending: Obstetrics and Gynecology | Admitting: Obstetrics and Gynecology

## 2012-07-10 ENCOUNTER — Encounter (HOSPITAL_COMMUNITY): Payer: Self-pay | Admitting: Anesthesiology

## 2012-07-10 ENCOUNTER — Other Ambulatory Visit: Payer: Self-pay

## 2012-07-10 ENCOUNTER — Encounter (HOSPITAL_COMMUNITY): Payer: Self-pay | Admitting: *Deleted

## 2012-07-10 DIAGNOSIS — N921 Excessive and frequent menstruation with irregular cycle: Secondary | ICD-10-CM | POA: Diagnosis present

## 2012-07-10 DIAGNOSIS — N920 Excessive and frequent menstruation with regular cycle: Secondary | ICD-10-CM | POA: Diagnosis not present

## 2012-07-10 DIAGNOSIS — D259 Leiomyoma of uterus, unspecified: Secondary | ICD-10-CM | POA: Insufficient documentation

## 2012-07-10 DIAGNOSIS — Z01812 Encounter for preprocedural laboratory examination: Secondary | ICD-10-CM | POA: Insufficient documentation

## 2012-07-10 DIAGNOSIS — Z01810 Encounter for preprocedural cardiovascular examination: Secondary | ICD-10-CM | POA: Insufficient documentation

## 2012-07-10 HISTORY — PX: DILITATION & CURRETTAGE/HYSTROSCOPY WITH THERMACHOICE ABLATION: SHX5569

## 2012-07-10 SURGERY — DILATATION & CURETTAGE/HYSTEROSCOPY WITH THERMACHOICE ABLATION
Anesthesia: General | Site: Vagina | Wound class: Clean Contaminated

## 2012-07-10 MED ORDER — OXYCODONE-ACETAMINOPHEN 5-325 MG PO TABS: 1.0000 | ORAL_TABLET | Freq: Four times a day (QID) | ORAL | Status: DC | PRN

## 2012-07-10 MED ORDER — 0.9 % SODIUM CHLORIDE (POUR BTL) OPTIME
TOPICAL | Status: DC | PRN
  Administered 2012-07-10: 500 mL

## 2012-07-10 MED ORDER — KETOROLAC TROMETHAMINE 30 MG/ML IJ SOLN
INTRAMUSCULAR | Status: AC
  Filled 2012-07-10: qty 1

## 2012-07-10 MED ORDER — LIDOCAINE HCL (CARDIAC) 10 MG/ML IV SOLN
INTRAVENOUS | Status: DC | PRN
  Administered 2012-07-10: 20 mg via INTRAVENOUS

## 2012-07-10 MED ORDER — FENTANYL CITRATE 0.05 MG/ML IJ SOLN
INTRAMUSCULAR | Status: DC | PRN
  Administered 2012-07-10: 50 ug via INTRAVENOUS

## 2012-07-10 MED ORDER — FENTANYL CITRATE 0.05 MG/ML IJ SOLN
INTRAMUSCULAR | Status: AC
  Filled 2012-07-10: qty 2

## 2012-07-10 MED ORDER — ONDANSETRON HCL 4 MG/2ML IJ SOLN
INTRAMUSCULAR | Status: AC
  Filled 2012-07-10: qty 2

## 2012-07-10 MED ORDER — CEFAZOLIN SODIUM-DEXTROSE 2-3 GM-% IV SOLR
2.0000 g | Freq: Once | INTRAVENOUS | Status: AC
  Administered 2012-07-10: 2 g via INTRAVENOUS

## 2012-07-10 MED ORDER — BUPIVACAINE-EPINEPHRINE 0.5% -1:200000 IJ SOLN
INTRAMUSCULAR | Status: DC | PRN
  Administered 2012-07-10: 20 mL

## 2012-07-10 MED ORDER — PROPOFOL 10 MG/ML IV EMUL
INTRAVENOUS | Status: AC
  Filled 2012-07-10: qty 20

## 2012-07-10 MED ORDER — LACTATED RINGERS IV SOLN
INTRAVENOUS | Status: DC
  Administered 2012-07-10: 1000 mL via INTRAVENOUS

## 2012-07-10 MED ORDER — LIDOCAINE HCL (PF) 1 % IJ SOLN
INTRAMUSCULAR | Status: AC
  Filled 2012-07-10: qty 5

## 2012-07-10 MED ORDER — MIDAZOLAM HCL 2 MG/2ML IJ SOLN
INTRAMUSCULAR | Status: AC
  Filled 2012-07-10: qty 2

## 2012-07-10 MED ORDER — ONDANSETRON HCL 4 MG/2ML IJ SOLN: 4.0000 mg | Freq: Once | INTRAMUSCULAR | Status: DC | PRN

## 2012-07-10 MED ORDER — SODIUM CHLORIDE 0.9 % IR SOLN
Status: DC | PRN
  Administered 2012-07-10: 3000 mL

## 2012-07-10 MED ORDER — FENTANYL CITRATE 0.05 MG/ML IJ SOLN: 25.0000 ug | INTRAMUSCULAR | Status: DC | PRN

## 2012-07-10 MED ORDER — BUPIVACAINE-EPINEPHRINE PF 0.5-1:200000 % IJ SOLN
INTRAMUSCULAR | Status: AC
  Filled 2012-07-10: qty 10

## 2012-07-10 MED ORDER — PROPOFOL 10 MG/ML IV BOLUS
INTRAVENOUS | Status: DC | PRN
  Administered 2012-07-10: 140 mg via INTRAVENOUS

## 2012-07-10 MED ORDER — ONDANSETRON HCL 4 MG/2ML IJ SOLN
4.0000 mg | Freq: Once | INTRAMUSCULAR | Status: AC
  Administered 2012-07-10: 4 mg via INTRAVENOUS

## 2012-07-10 MED ORDER — MIDAZOLAM HCL 2 MG/2ML IJ SOLN
1.0000 mg | INTRAMUSCULAR | Status: DC | PRN
  Administered 2012-07-10: 2 mg via INTRAVENOUS

## 2012-07-10 MED ORDER — KETOROLAC TROMETHAMINE 30 MG/ML IJ SOLN
30.0000 mg | Freq: Once | INTRAMUSCULAR | Status: AC
  Administered 2012-07-10: 30 mg via INTRAVENOUS

## 2012-07-10 MED ORDER — CEFAZOLIN SODIUM-DEXTROSE 2-3 GM-% IV SOLR
INTRAVENOUS | Status: AC
  Filled 2012-07-10: qty 50

## 2012-07-10 MED ORDER — KETOROLAC TROMETHAMINE 10 MG PO TABS
10.0000 mg | ORAL_TABLET | Freq: Four times a day (QID) | ORAL | Status: DC | PRN
  Filled 2012-07-10: qty 1

## 2012-07-10 MED ORDER — KETOROLAC TROMETHAMINE 10 MG PO TABS: 10.0000 mg | ORAL_TABLET | Freq: Four times a day (QID) | ORAL | Status: DC | PRN

## 2012-07-10 MED ORDER — DEXTROSE 5 % IV SOLN
INTRAVENOUS | Status: DC | PRN
  Administered 2012-07-10: 500 mL via INTRAVENOUS

## 2012-07-10 SURGICAL SUPPLY — 26 items
BAG DECANTER FOR FLEXI CONT (MISCELLANEOUS) ×2 IMPLANT
BAG HAMPER (MISCELLANEOUS) ×2 IMPLANT
CATH ROBINSON RED A/P 16FR (CATHETERS) ×2 IMPLANT
CATH THERMACHOICE III (CATHETERS) ×2 IMPLANT
CLOTH BEACON ORANGE TIMEOUT ST (SAFETY) ×2 IMPLANT
COVER LIGHT HANDLE STERIS (MISCELLANEOUS) ×4 IMPLANT
FORMALIN 10 PREFIL 480ML (MISCELLANEOUS) ×2 IMPLANT
GLOVE ECLIPSE 7.0 STRL STRAW (GLOVE) ×1 IMPLANT
GLOVE ECLIPSE 9.0 STRL (GLOVE) ×2 IMPLANT
GLOVE EXAM NITRILE MD LF STRL (GLOVE) ×1 IMPLANT
GLOVE INDICATOR 7.5 STRL GRN (GLOVE) ×1 IMPLANT
GLOVE INDICATOR STER SZ 9 (GLOVE) ×2 IMPLANT
GOWN STRL REIN 3XL LVL4 (GOWN DISPOSABLE) ×2 IMPLANT
GOWN STRL REIN XL XLG (GOWN DISPOSABLE) ×4 IMPLANT
INST SET HYSTEROSCOPY (KITS) ×2 IMPLANT
IV D5W 500ML (IV SOLUTION) ×2 IMPLANT
IV NS IRRIG 3000ML ARTHROMATIC (IV SOLUTION) ×2 IMPLANT
KIT ROOM TURNOVER AP CYSTO (KITS) ×2 IMPLANT
MANIFOLD NEPTUNE II (INSTRUMENTS) ×2 IMPLANT
MANIFOLD NEPTUNE WASTE (CANNULA) ×2 IMPLANT
NS IRRIG 1000ML POUR BTL (IV SOLUTION) ×2 IMPLANT
PACK PERI GYN (CUSTOM PROCEDURE TRAY) ×2 IMPLANT
PAD ARMBOARD 7.5X6 YLW CONV (MISCELLANEOUS) ×2 IMPLANT
PAD TELFA 3X4 1S STER (GAUZE/BANDAGES/DRESSINGS) ×2 IMPLANT
SET BASIN LINEN APH (SET/KITS/TRAYS/PACK) ×2 IMPLANT
SET IRRIG Y TYPE TUR BLADDER L (SET/KITS/TRAYS/PACK) ×2 IMPLANT

## 2012-07-10 NOTE — Brief Op Note (Signed)
07/10/2012  8:25 AM  PATIENT:  [name]  51 y.o. female  PRE-OPERATIVE DIAGNOSIS:  menometrorrhagia uterine fibroids  POST-OPERATIVE DIAGNOSIS:  menometrorrhagia uterine fibroids  PROCEDURE:  Procedure(s) (LRB) with comments: DILATATION & CURETTAGE/HYSTEROSCOPY WITH THERMACHOICE ABLATION (N/A) - D5 13l in, 13 ml out, temp 87degree celscious, total therapy time 16sec  SURGEON:  Surgeon(s) and Role:    * [name], MD - Primary  PHYSICIAN ASSISTANT:   ASSISTANTS: None   ANESTHESIA:   Local, general with LMA  EBL:  Total I/O In: 300 [I.V.:300] Out: 0   BLOOD ADMINISTERED:none  DRAINS: none   LOCAL MEDICATIONS USED:  MARCAINE    and Amount: 20 ml  SPECIMEN:  No Specimen  DISPOSITION OF SPECIMEN:  N/A  COUNTS:  YES  TOURNIQUET:  * No tourniquets in log *  DICTATION: .Dragon Dictation Patient was taken to the operating room prepped and draped for vaginal procedure timeout conducted, Ancef administered and procedure confirmed by surgical team. Was grasped with single-tooth tenaculum with open sided speculum in place, the uterus sounded in the anteflexed position to 11 cm, dilated to 25 [place] allowing introduction of the rigid 23 mm 30 hysteroscope without difficulty, identifying both tubal ostia and confirming a thin endometrium without tissue buildup. Prior biopsy had shown benign nature to the tissues. The [employer]ne[employer] ThermaChoice 3 endometrial ablation device was repaired inserted activated and the 8 minute thermal ablation sequence completed. All 13 cc of D5W fluid were recovered, and patient allowed to awaken and go to recovery room in good condition PLAN OF CARE: Discharge to home after PACU  PATIENT DISPOSITION:  PACU - hemodynamically stable.   Delay start of Pharmacological VTE agent (>24hrs) due to surgical blood loss or risk of bleeding: not applicable

## 2012-07-10 NOTE — Interval H&P Note (Signed)
History and Physical Interval Note:  07/10/2012 7:33 AM  [name]  has presented today for surgery, with the diagnosis of menometrorrhagia uterine fibroids  The various methods of treatment have been discussed with the patient and family. After consideration of risks, benefits and other options for treatment, the patient has consented to  Procedure(s) (LRB) with comments: DILATATION & CURETTAGE/HYSTEROSCOPY WITH THERMACHOICE ABLATION (N/A) as a surgical intervention .  The patient's history has been reviewed, patient examined, no change in status, stable for surgery.  I have reviewed the patient's chart and labs.  Questions were answered to the patient's satisfaction.     [name]

## 2012-07-10 NOTE — H&P (View-Only) (Signed)
[name] is an 51 y.o. female. She is admitted for hysteroscopy D&C and endometrial ablation. She has known uterine enlargement with small intramural fibroids. Ultrasound in November 2012 showed uterine enlargement and a posterior fibroid 3.5 cm, anterior fibroid 3.9 cm and 3 small fibroids. The endometrial cavity appears grossly normal left and right ovaries were normal. She finds the menstrual flow unacceptably heavy period in September she bled for 3 weeks. In August of that 4 days with irregular bleeding throughout this summer endometrial biopsy shows inactive endometrium with breakdown and degenerative changes no hyperplasia or carcinoma the patient is sufficiently frustrated with heavy periods that she wishes for interventions. Endometrial ablations is consider the least invasive option available to the patient  Pertinent Gynecological History: Menses: irregular occurring approximately every 30-60 days days without intermenstrual spotting Bleeding: dysfunctional uterine bleeding Contraception: tubal ligation DES exposure: unknown Blood transfusions: none Sexually transmitted diseases: no past history Previous GYN Procedures: Cesarean section x2 with tubal ligation and cesarean. Breast biopsy x1 left breast  Last mammogram: normal Date: 2012 Last pap: normal Date: 2012 August OB History: G2, P2 cesarean section x2   Menstrual History: Menarche age: 89 Patient's last menstrual period was 06/03/2012.    Past Medical History  Diagnosis Date  . Chronic neck pain   . Chronic back pain   . Anemia   . Arthritis     Past Surgical History  Procedure Date  . Cesarean section     x2  . Breast surgery     left partail mastectomy-cyst  . Ankle surgery     left ankle cyst  . Cervical disc surgery   . Back surgery     lumbar disc surgery  . Tubal ligation     with last c-section    No family history on file.  Social History:  reports that she has never smoked. She does not have  any smokeless tobacco history on file. She reports that she does not drink alcohol or use illicit drugs.  Allergies: No Known Allergies   (Not in a hospital admission)  Review of Systems  Constitutional: Chills: .Marland Kitchen    Last menstrual period 06/03/2012. Physical Exam Physical Examination: General appearance - alert, well appearing, and in no distress, oriented to person, place, and time and normal appearing weight Mental status - normal mood, behavior, speech, dress, motor activity, and thought processes, anxious Eyes - pupils equal and reactive, extraocular eye movements intact Mouth - mucous membranes moist, pharynx normal without lesions Neck - supple, no significant adenopathy Chest - clear to auscultation, no wheezes, rales or rhonchi, symmetric air entry Heart - normal rate and regular rhythm Abdomen - soft, nontender, nondistended, no masses or organomegaly Pelvic - VULVA: normal appearing vulva with no masses, tenderness or lesions, VAGINA: normal appearing vagina with normal color and discharge, no lesions, CERVIX: normal appearing cervix without discharge or lesions, UTERUS: enlarged 10 week's size, dates and pelvis ADNEXA: normal adnexa in size, nontender and no masses, PAP: thin-prep method normal result August 2012, exam chaperoned by office nurse Extremities - peripheral pulses normal, no pedal edema, no clubbing or cyanosis   Results for orders placed during the hospital encounter of 07/03/12 (from the past 24 hour(s))  SURGICAL PCR SCREEN     Status: Normal   Collection Time   07/03/12  9:14 AM      Component Value Range   MRSA, PCR NEGATIVE  NEGATIVE   Staphylococcus aureus NEGATIVE  NEGATIVE  COMPREHENSIVE METABOLIC PANEL  Status: Abnormal   Collection Time   07/03/12  9:29 AM      Component Value Range   Sodium 136  135 - 145 mEq/L   Potassium 3.8  3.5 - 5.1 mEq/L   Chloride 100  96 - 112 mEq/L   CO2 26  19 - 32 mEq/L   Glucose, Bld 103 (*) 70 - 99 mg/dL    BUN 8  6 - 23 mg/dL   Creatinine, Ser 1.61  0.50 - 1.10 mg/dL   Calcium 9.7  8.4 - 09.6 mg/dL   Total Protein 7.3  6.0 - 8.3 g/dL   Albumin 3.5  3.5 - 5.2 g/dL   AST 22  0 - 37 U/L   ALT 20  0 - 35 U/L   Alkaline Phosphatase 76  39 - 117 U/L   Total Bilirubin 0.2 (*) 0.3 - 1.2 mg/dL   GFR calc non Af Amer >90  >90 mL/min   GFR calc Af Amer >90  >90 mL/min  CBC     Status: Abnormal   Collection Time   07/03/12  9:35 AM      Component Value Range   WBC 5.3  4.0 - 10.5 K/uL   RBC 3.73 (*) 3.87 - 5.11 MIL/uL   Hemoglobin 11.1 (*) 12.0 - 15.0 g/dL   HCT 04.5 (*) 40.9 - 81.1 %   MCV 90.1  78.0 - 100.0 fL   MCH 29.8  26.0 - 34.0 pg   MCHC 33.0  30.0 - 36.0 g/dL   RDW 91.4 (*) 78.2 - 95.6 %   Platelets 352  150 - 400 K/uL    No results found.  Assessment/Plan: Menorrhagia, in the perimenopausal age group Multiple small uterine fibroids Recent history of heart racing will obtain an EKG  Plan: Hysteroscopy D&C endometrial ablation 07/11/2011  Wynter Grave V 07/03/2012, 5:00 PM

## 2012-07-10 NOTE — Transfer of Care (Signed)
Immediate Anesthesia Transfer of Care Note  Patient: [name]  Procedure(s) Performed: Procedure(s) (LRB): DILATATION & CURETTAGE/HYSTEROSCOPY WITH THERMACHOICE ABLATION (N/A)  Patient Location: PACU  Anesthesia Type: General  Level of Consciousness: awake  Airway & Oxygen Therapy: Patient Spontanous Breathing and non-rebreather face mask  Post-op Assessment: Report given to PACU RN, Post -op Vital signs reviewed and stable and Patient moving all extremities  Post vital signs: Reviewed and stable  Complications: No apparent anesthesia complications

## 2012-07-10 NOTE — Op Note (Signed)
See operative note details included in brief operative note 

## 2012-07-10 NOTE — Anesthesia Preprocedure Evaluation (Addendum)
Anesthesia Evaluation  Patient identified by MRN, date of birth, ID band Patient awake    Reviewed: Allergy & Precautions, H&P , NPO status , Patient's Chart, lab work & pertinent test results  History of Anesthesia Complications Negative for: history of anesthetic complications  Airway Mallampati: II TM Distance: >3 FB Neck ROM: Limited    Dental  (+) Teeth Intact   Pulmonary neg pulmonary ROS,  breath sounds clear to auscultation        Cardiovascular negative cardio ROS  Rhythm:Regular Rate:Normal     Neuro/Psych Anxiety    GI/Hepatic   Endo/Other    Renal/GU      Musculoskeletal   Abdominal   Peds  Hematology   Anesthesia Other Findings   Reproductive/Obstetrics                           Anesthesia Physical Anesthesia Plan  ASA: II  Anesthesia Plan: General   Post-op Pain Management:    Induction: Intravenous  Airway Management Planned: LMA  Additional Equipment:   Intra-op Plan:   Post-operative Plan: Extubation in OR  Informed Consent: I have reviewed the patients History and Physical, chart, labs and discussed the procedure including the risks, benefits and alternatives for the proposed anesthesia with the patient or authorized representative who has indicated his/her understanding and acceptance.     Plan Discussed with:   Anesthesia Plan Comments:         Anesthesia Quick Evaluation

## 2012-07-10 NOTE — Anesthesia Procedure Notes (Signed)
Procedure Name: LMA Insertion Date/Time: 07/10/2012 7:46 AM Performed by: Franco Nones Pre-anesthesia Checklist: Patient identified, Patient being monitored, Emergency Drugs available, Timeout performed and Suction available Patient Re-evaluated:Patient Re-evaluated prior to inductionOxygen Delivery Method: Circle System Utilized Preoxygenation: Pre-oxygenation with 100% oxygen Intubation Type: IV induction Ventilation: Mask ventilation without difficulty LMA: LMA inserted LMA Size: 4.0 Number of attempts: 1 Placement Confirmation: positive ETCO2 and breath sounds checked- equal and bilateral Tube secured with: Tape

## 2012-07-10 NOTE — Anesthesia Postprocedure Evaluation (Signed)
Anesthesia Post Note  Patient: [name]  Procedure(s) Performed: Procedure(s) (LRB): DILATATION & CURETTAGE/HYSTEROSCOPY WITH THERMACHOICE ABLATION (N/A)  Anesthesia type: General  Patient location: PACU  Post pain: Pain level controlled  Post assessment: Post-op Vital signs reviewed, Patient's Cardiovascular Status Stable, Respiratory Function Stable, Patent Airway, No signs of Nausea or vomiting and Pain level controlled  Last Vitals:  Filed Vitals:   07/10/12 0831  BP: 134/69  Pulse: 105  Temp: 36.7 C  Resp: 13    Post vital signs: Reviewed and stable  Level of consciousness: awake and alert   Complications: No apparent anesthesia complications

## 2012-07-12 ENCOUNTER — Encounter (HOSPITAL_COMMUNITY): Payer: Self-pay | Admitting: Obstetrics and Gynecology

## 2012-07-31 DIAGNOSIS — R002 Palpitations: Secondary | ICD-10-CM | POA: Diagnosis not present

## 2012-07-31 DIAGNOSIS — R011 Cardiac murmur, unspecified: Secondary | ICD-10-CM | POA: Diagnosis not present

## 2012-07-31 DIAGNOSIS — F411 Generalized anxiety disorder: Secondary | ICD-10-CM | POA: Diagnosis not present

## 2012-08-17 DIAGNOSIS — N949 Unspecified condition associated with female genital organs and menstrual cycle: Secondary | ICD-10-CM | POA: Diagnosis not present

## 2012-08-23 DIAGNOSIS — M545 Low back pain, unspecified: Secondary | ICD-10-CM | POA: Diagnosis not present

## 2012-08-23 DIAGNOSIS — I10 Essential (primary) hypertension: Secondary | ICD-10-CM | POA: Diagnosis not present

## 2012-08-23 DIAGNOSIS — G8929 Other chronic pain: Secondary | ICD-10-CM | POA: Diagnosis not present

## 2012-09-04 DIAGNOSIS — R002 Palpitations: Secondary | ICD-10-CM | POA: Diagnosis not present

## 2012-10-15 ENCOUNTER — Other Ambulatory Visit (HOSPITAL_COMMUNITY): Payer: Self-pay | Admitting: Internal Medicine

## 2012-10-15 DIAGNOSIS — IMO0002 Reserved for concepts with insufficient information to code with codable children: Secondary | ICD-10-CM | POA: Diagnosis not present

## 2012-10-15 DIAGNOSIS — F411 Generalized anxiety disorder: Secondary | ICD-10-CM | POA: Diagnosis not present

## 2012-10-15 DIAGNOSIS — G8929 Other chronic pain: Secondary | ICD-10-CM | POA: Diagnosis not present

## 2012-10-15 DIAGNOSIS — Z0000 Encounter for general adult medical examination without abnormal findings: Secondary | ICD-10-CM

## 2012-10-22 ENCOUNTER — Ambulatory Visit (HOSPITAL_COMMUNITY): Payer: BC Managed Care – PPO

## 2012-10-30 ENCOUNTER — Ambulatory Visit (HOSPITAL_COMMUNITY)
Admission: RE | Admit: 2012-10-30 | Discharge: 2012-10-30 | Disposition: A | Discharge status: Home / Self Care | Payer: BC Managed Care – PPO | Source: Ambulatory Visit | Attending: Internal Medicine | Admitting: Internal Medicine

## 2012-10-30 DIAGNOSIS — IMO0002 Reserved for concepts with insufficient information to code with codable children: Secondary | ICD-10-CM | POA: Diagnosis not present

## 2012-10-30 DIAGNOSIS — Z0000 Encounter for general adult medical examination without abnormal findings: Secondary | ICD-10-CM

## 2012-10-30 DIAGNOSIS — M7020 Olecranon bursitis, unspecified elbow: Secondary | ICD-10-CM | POA: Diagnosis not present

## 2012-10-30 DIAGNOSIS — Z1231 Encounter for screening mammogram for malignant neoplasm of breast: Secondary | ICD-10-CM | POA: Insufficient documentation

## 2012-12-28 DIAGNOSIS — I10 Essential (primary) hypertension: Secondary | ICD-10-CM | POA: Diagnosis not present

## 2012-12-28 DIAGNOSIS — G8929 Other chronic pain: Secondary | ICD-10-CM | POA: Diagnosis not present

## 2012-12-28 DIAGNOSIS — M5412 Radiculopathy, cervical region: Secondary | ICD-10-CM | POA: Diagnosis not present

## 2012-12-28 DIAGNOSIS — F411 Generalized anxiety disorder: Secondary | ICD-10-CM | POA: Diagnosis not present

## 2012-12-28 DIAGNOSIS — IMO0002 Reserved for concepts with insufficient information to code with codable children: Secondary | ICD-10-CM | POA: Diagnosis not present

## 2013-02-27 ENCOUNTER — Other Ambulatory Visit (HOSPITAL_COMMUNITY): Payer: Self-pay | Admitting: Internal Medicine

## 2013-02-27 DIAGNOSIS — M47812 Spondylosis without myelopathy or radiculopathy, cervical region: Secondary | ICD-10-CM

## 2013-03-01 ENCOUNTER — Ambulatory Visit (HOSPITAL_COMMUNITY)
Admission: RE | Admit: 2013-03-01 | Discharge: 2013-03-01 | Disposition: A | Discharge status: Home / Self Care | Payer: BC Managed Care – PPO | Source: Ambulatory Visit | Attending: Internal Medicine | Admitting: Internal Medicine

## 2013-03-01 DIAGNOSIS — M5030 Other cervical disc degeneration, unspecified cervical region: Secondary | ICD-10-CM | POA: Diagnosis not present

## 2013-03-01 DIAGNOSIS — M47812 Spondylosis without myelopathy or radiculopathy, cervical region: Secondary | ICD-10-CM | POA: Diagnosis not present

## 2013-03-01 DIAGNOSIS — M542 Cervicalgia: Secondary | ICD-10-CM | POA: Diagnosis not present

## 2013-04-03 DIAGNOSIS — M25529 Pain in unspecified elbow: Secondary | ICD-10-CM | POA: Diagnosis not present

## 2013-04-03 DIAGNOSIS — M47812 Spondylosis without myelopathy or radiculopathy, cervical region: Secondary | ICD-10-CM | POA: Diagnosis not present

## 2013-04-03 DIAGNOSIS — M5030 Other cervical disc degeneration, unspecified cervical region: Secondary | ICD-10-CM | POA: Diagnosis not present

## 2013-04-10 ENCOUNTER — Other Ambulatory Visit (HOSPITAL_COMMUNITY): Payer: Self-pay | Admitting: Family Medicine

## 2013-04-10 ENCOUNTER — Ambulatory Visit (HOSPITAL_COMMUNITY)
Admission: RE | Admit: 2013-04-10 | Discharge: 2013-04-10 | Disposition: A | Discharge status: Home / Self Care | Payer: BC Managed Care – PPO | Source: Ambulatory Visit | Attending: Family Medicine | Admitting: Family Medicine

## 2013-04-10 DIAGNOSIS — IMO0002 Reserved for concepts with insufficient information to code with codable children: Secondary | ICD-10-CM | POA: Diagnosis not present

## 2013-04-10 DIAGNOSIS — I10 Essential (primary) hypertension: Secondary | ICD-10-CM | POA: Diagnosis not present

## 2013-04-10 DIAGNOSIS — M7710 Lateral epicondylitis, unspecified elbow: Secondary | ICD-10-CM | POA: Insufficient documentation

## 2013-04-10 DIAGNOSIS — M25529 Pain in unspecified elbow: Secondary | ICD-10-CM | POA: Diagnosis not present

## 2013-05-20 DIAGNOSIS — Z23 Encounter for immunization: Secondary | ICD-10-CM | POA: Diagnosis not present

## 2013-05-30 ENCOUNTER — Telehealth: Payer: Self-pay | Admitting: *Deleted

## 2013-05-30 NOTE — Telephone Encounter (Signed)
Pt states light bleeding every month since April. Pt states had endometrial ablation 07/10/2012. Pt informed WNL. Pt can have a light period monthly after endo. Ablation, no c/o abdominal pain at this time. Pt states please with results of Endometrial Ablation.

## 2013-06-11 DIAGNOSIS — D649 Anemia, unspecified: Secondary | ICD-10-CM | POA: Diagnosis not present

## 2013-06-11 DIAGNOSIS — IMO0002 Reserved for concepts with insufficient information to code with codable children: Secondary | ICD-10-CM | POA: Diagnosis not present

## 2013-06-11 DIAGNOSIS — G8929 Other chronic pain: Secondary | ICD-10-CM | POA: Diagnosis not present

## 2013-06-11 DIAGNOSIS — G4700 Insomnia, unspecified: Secondary | ICD-10-CM | POA: Diagnosis not present

## 2013-08-12 ENCOUNTER — Other Ambulatory Visit: Payer: Self-pay | Admitting: Obstetrics and Gynecology

## 2013-08-12 DIAGNOSIS — N6320 Unspecified lump in the left breast, unspecified quadrant: Secondary | ICD-10-CM

## 2013-08-13 ENCOUNTER — Other Ambulatory Visit: Payer: Self-pay | Admitting: Obstetrics and Gynecology

## 2013-08-13 DIAGNOSIS — N6310 Unspecified lump in the right breast, unspecified quadrant: Secondary | ICD-10-CM

## 2013-08-16 ENCOUNTER — Other Ambulatory Visit: Payer: Self-pay | Admitting: *Deleted

## 2013-08-16 MED ORDER — PROPRANOLOL HCL ER 60 MG PO CP24: 60.0000 mg | ORAL_CAPSULE | Freq: Every day | ORAL | Status: DC

## 2013-08-16 NOTE — Telephone Encounter (Signed)
Spoke with patient- she needs an appointment. she states she does not have any money at present time. Informed patient --refill x1 month  She will have to make an appointment with Dr Rennis Golden or have pcp fill medication. Patient verbalized understanding. E-sent refill

## 2013-08-19 ENCOUNTER — Ambulatory Visit: Payer: BC Managed Care – PPO | Admitting: Internal Medicine

## 2013-08-20 ENCOUNTER — Ambulatory Visit: Payer: BC Managed Care – PPO | Admitting: Internal Medicine

## 2013-09-04 ENCOUNTER — Ambulatory Visit (HOSPITAL_COMMUNITY)
Admission: RE | Admit: 2013-09-04 | Discharge: 2013-09-04 | Disposition: A | Discharge status: Home / Self Care | Payer: BC Managed Care – PPO | Source: Ambulatory Visit | Attending: Obstetrics and Gynecology | Admitting: Obstetrics and Gynecology

## 2013-09-04 ENCOUNTER — Encounter (HOSPITAL_COMMUNITY): Payer: Self-pay

## 2013-09-04 ENCOUNTER — Other Ambulatory Visit: Payer: Self-pay | Admitting: Obstetrics and Gynecology

## 2013-09-04 VITALS — BP 130/81 | HR 86 | Temp 98.0°F | Resp 16

## 2013-09-04 DIAGNOSIS — N63 Unspecified lump in unspecified breast: Secondary | ICD-10-CM | POA: Diagnosis not present

## 2013-09-04 DIAGNOSIS — N6310 Unspecified lump in the right breast, unspecified quadrant: Secondary | ICD-10-CM

## 2013-09-04 DIAGNOSIS — C50919 Malignant neoplasm of unspecified site of unspecified female breast: Secondary | ICD-10-CM | POA: Insufficient documentation

## 2013-09-04 MED ORDER — LIDOCAINE HCL (PF) 2 % IJ SOLN
10.0000 mL | Freq: Once | INTRAMUSCULAR | Status: AC
  Administered 2013-09-04: 10 mL

## 2013-09-04 MED ORDER — LIDOCAINE HCL (PF) 2 % IJ SOLN
INTRAMUSCULAR | Status: AC
  Filled 2013-09-04: qty 10

## 2013-09-09 ENCOUNTER — Other Ambulatory Visit: Payer: Self-pay | Admitting: Obstetrics and Gynecology

## 2013-09-09 DIAGNOSIS — C50911 Malignant neoplasm of unspecified site of right female breast: Secondary | ICD-10-CM

## 2013-09-09 DIAGNOSIS — N6489 Other specified disorders of breast: Secondary | ICD-10-CM

## 2013-09-10 DIAGNOSIS — I10 Essential (primary) hypertension: Secondary | ICD-10-CM | POA: Diagnosis not present

## 2013-09-10 DIAGNOSIS — G8929 Other chronic pain: Secondary | ICD-10-CM | POA: Diagnosis not present

## 2013-09-10 DIAGNOSIS — C50919 Malignant neoplasm of unspecified site of unspecified female breast: Secondary | ICD-10-CM | POA: Diagnosis not present

## 2013-09-10 DIAGNOSIS — IMO0002 Reserved for concepts with insufficient information to code with codable children: Secondary | ICD-10-CM | POA: Diagnosis not present

## 2013-09-11 ENCOUNTER — Ambulatory Visit
Admission: RE | Admit: 2013-09-11 | Discharge: 2013-09-11 | Disposition: A | Discharge status: Home / Self Care | Payer: BC Managed Care – PPO | Source: Ambulatory Visit | Attending: Obstetrics and Gynecology | Admitting: Obstetrics and Gynecology

## 2013-09-11 DIAGNOSIS — N6489 Other specified disorders of breast: Secondary | ICD-10-CM

## 2013-09-12 ENCOUNTER — Ambulatory Visit (HOSPITAL_COMMUNITY): Admission: RE | Admit: 2013-09-12 | Payer: BC Managed Care – PPO | Source: Ambulatory Visit

## 2013-09-16 ENCOUNTER — Ambulatory Visit (HOSPITAL_COMMUNITY)
Admission: RE | Admit: 2013-09-16 | Discharge: 2013-09-16 | Disposition: A | Discharge status: Home / Self Care | Payer: BC Managed Care – PPO | Source: Ambulatory Visit | Attending: Obstetrics and Gynecology | Admitting: Obstetrics and Gynecology

## 2013-09-16 DIAGNOSIS — N63 Unspecified lump in unspecified breast: Secondary | ICD-10-CM | POA: Insufficient documentation

## 2013-09-16 DIAGNOSIS — C50919 Malignant neoplasm of unspecified site of unspecified female breast: Secondary | ICD-10-CM | POA: Diagnosis not present

## 2013-09-16 DIAGNOSIS — C50911 Malignant neoplasm of unspecified site of right female breast: Secondary | ICD-10-CM

## 2013-09-16 MED ORDER — GADOBENATE DIMEGLUMINE 529 MG/ML IV SOLN
15.0000 mL | Freq: Once | INTRAVENOUS | Status: AC | PRN
  Administered 2013-09-16: 15 mL via INTRAVENOUS

## 2013-09-18 ENCOUNTER — Other Ambulatory Visit (INDEPENDENT_AMBULATORY_CARE_PROVIDER_SITE_OTHER): Payer: Self-pay | Admitting: General Surgery

## 2013-09-18 DIAGNOSIS — R928 Other abnormal and inconclusive findings on diagnostic imaging of breast: Secondary | ICD-10-CM

## 2013-09-19 ENCOUNTER — Encounter (INDEPENDENT_AMBULATORY_CARE_PROVIDER_SITE_OTHER): Payer: Self-pay | Admitting: General Surgery

## 2013-09-19 ENCOUNTER — Ambulatory Visit (INDEPENDENT_AMBULATORY_CARE_PROVIDER_SITE_OTHER): Payer: BC Managed Care – PPO | Admitting: General Surgery

## 2013-09-19 VITALS — BP 114/70 | HR 70 | Resp 16 | Ht 67.0 in | Wt 162.0 lb

## 2013-09-19 DIAGNOSIS — C50219 Malignant neoplasm of upper-inner quadrant of unspecified female breast: Secondary | ICD-10-CM

## 2013-09-19 NOTE — Progress Notes (Signed)
Patient ID: [name], female   DOB: 07/27/1961, 52 y.o.   MRN: [id number]  Chief Complaint  Patient presents with  . Other    new br    HPI [name] is a 52 y.o. female.  Referred by Dr Fusco HPI This is a 52-year-old female who in the second week of December noted a right breast mass on her self-exam. There's been no change since then. She has a prior history of removal of the benign disease on the left. She has no other history of any breast complaints. She has no family history of any breast or ovarian cancer. She underwent mammogram and ultrasound which showed a less than 1 cm mass in the right breast. This underwent a biopsy that showed a grade 1-2 invasive ductal carcinoma that is hormone receptor positive, HER-2/neu is not amplified, and the proliferation marker is 13%. She's also undergone an MRI which shows in the upper inner quadrant of the right breast at approximately 2:00 there is a 1.5 x 0.9 x 1.2 cm enhancing spiculated mass. Additionally the right breast lower and upper outer quarters there is some asymmetric non-mass and enhancement. The left breast is normal and there are no abnormal lymph nodes. She comes in to discuss her options today.  Past Medical History  Diagnosis Date  . Chronic neck pain   . Chronic back pain   . Anemia   . Arthritis     Past Surgical History  Procedure Laterality Date  . Cesarean section      x2  . Breast surgery      left partail mastectomy-cyst  . Ankle surgery      left ankle cyst  . Cervical disc surgery    . Back surgery      lumbar disc surgery  . Tubal ligation      with last c-section  . Dilitation & currettage/hystroscopy with thermachoice ablation  07/10/2012    Procedure: DILATATION & CURETTAGE/HYSTEROSCOPY WITH THERMACHOICE ABLATION;  Surgeon: John V Ferguson, MD;  Location: [facility];  Service: Gynecology;  Laterality: N/A;  D5 13ml in, 13 ml out, temp 87degree celcius, total therapy time 9min 16sec    History reviewed. No  pertinent family history.  Social History History  Substance Use Topics  . Smoking status: Never Smoker   . Smokeless tobacco: Not on file  . Alcohol Use: No    No Known Allergies  Current Outpatient Prescriptions  Medication Sig Dispense Refill  . ALPRAZolam (XANAX) 0.5 MG tablet Take 0.5 mg by mouth 3 (three) times daily as needed. For anxiety      . amitriptyline (ELAVIL) 25 MG tablet Take 50 mg by mouth at bedtime.      . cyclobenzaprine (FLEXERIL) 10 MG tablet Take 10 mg by mouth 3 (three) times daily as needed. For muscle spasms      . diphenhydrAMINE (BENADRYL) 25 MG tablet Take 50 mg by mouth every 6 (six) hours as needed. For allergies      . HYDROcodone-acetaminophen (LORTAB) 10-500 MG per tablet Take 1 tablet by mouth every 6 (six) hours as needed. For pain      . Multiple Vitamin (MULTIVITAMIN WITH MINERALS) TABS Take 1 tablet by mouth daily.      . ketorolac (TORADOL) 10 MG tablet Take 1 tablet (10 mg total) by mouth every 6 (six) hours as needed for pain.  20 tablet  0  . oxyCODONE-acetaminophen (PERCOCET/ROXICET) 5-325 MG per tablet Take 1-2 tablets by mouth   every 6 (six) hours as needed.  15 tablet  0  . propranolol ER (INDERAL LA) 60 MG 24 hr capsule Take 1 capsule (60 mg total) by mouth daily.  30 capsule  0   No current facility-administered medications for this visit.    Review of Systems Review of Systems  Constitutional: Negative for fever, chills and unexpected weight change.  HENT: Negative for congestion, hearing loss, sore throat, trouble swallowing and voice change.   Eyes: Negative for visual disturbance.  Respiratory: Negative for cough and wheezing.   Cardiovascular: Negative for chest pain, palpitations and leg swelling.  Gastrointestinal: Negative for nausea, vomiting, abdominal pain, diarrhea, constipation, blood in stool, abdominal distention and anal bleeding.  Genitourinary: Negative for hematuria, vaginal bleeding and difficulty urinating.    Musculoskeletal: Negative for arthralgias.  Skin: Negative for rash and wound.  Neurological: Negative for seizures, syncope and headaches.  Hematological: Negative for adenopathy. Does not bruise/bleed easily.  Psychiatric/Behavioral: Negative for confusion.    Blood pressure 114/70, pulse 70, resp. rate 16, height 5' 7" (1.702 m), weight 162 lb (73.483 kg), last menstrual period 08/27/2013.  Physical Exam Physical Exam  Vitals reviewed. Constitutional: She appears well-developed and well-nourished.  HENT:  Head: Normocephalic and atraumatic.  Neck: Neck supple.  Cardiovascular: Normal rate, regular rhythm and normal heart sounds.   Pulmonary/Chest: Effort normal and breath sounds normal. She has no wheezes. She has no rales. Right breast exhibits mass. Right breast exhibits no inverted nipple, no nipple discharge, no skin change and no tenderness. Left breast exhibits no inverted nipple, no mass, no nipple discharge, no skin change and no tenderness.    Lymphadenopathy:    She has no cervical adenopathy.    She has no axillary adenopathy.       Right: No supraclavicular adenopathy present.       Left: No supraclavicular adenopathy present.    Data Reviewed BILATERAL BREAST MRI WITH AND WITHOUT CONTRAST  TECHNIQUE:  Multiplanar, multisequence MR images of both breasts were obtained  prior to and following the intravenous administration of 15ml of  MultiHance.  THREE-DIMENSIONAL MR IMAGE RENDERING ON INDEPENDENT WORKSTATION:  Three-dimensional MR images were rendered by post-processing of the  original MR data on an independent workstation. The  three-dimensional MR images were interpreted, and findings are  reported in the following complete MRI report for this study. Three  dimensional images were evaluated at the independent Dyn[employer]  workstation  COMPARISON: Previous exams  FINDINGS:  Breast composition: b. Scattered fibroglandular tissue  Background parenchymal  enhancement: Moderate  Right breast: Within the upper-inner quadrant of the right breast,  approximately 2 o'clock position, there is a 1.5 x 0.9 x 1.2 cm  enhancing spiculated mass containing a biopsy marking clip  compatible with recently biopsied invasive ductal carcinoma.  Additionally within the right breast lower and upper outer quadrants  there is asymmetric non mass enhancement.  Left breast: No mass or abnormal enhancement.  Lymph nodes: No abnormal appearing lymph nodes.  Ancillary findings: None.  IMPRESSION:  1. Irregular spiculated mass within the upper inner right breast  containing central biopsy marking clip compatible with biopsy-proven  breast carcinoma.  2. Within the lower outer and upper outer quadrants of the right  breast there is asymmetric non mass enhancement. Further evaluation  with MRI guided core needle biopsy is recommended.  RECOMMENDATION:  MRI guided core needle biopsy of asymmetric non mass enhancement  within the upper-outer and lower outer quadrant of the right breast.    The most suspicious area should be targeted at the time of biopsy.  BI-RADS CATEGORY 4: Suspicious abnormality - biopsy should be  considered.   Assessment    Clinical stage I right breast cancer    Plan    MR biopsy of right breast NME prior to any further decision making   We discussed the staging and pathophysiology of breast cancer. We discussed all of the different options for treatment for breast cancer including surgery, chemotherapy, radiation therapy, Herceptin, and antiestrogen therapy. We reviewed her pathology.  We discussed a sentinel lymph node biopsy as she does not appear to having lymph node involvement right now. We discussed the performance of that with injection of radioactive tracer and possibly blue dye. We discussed that she would have an incision underneath her axillary hairline. We discussed that there is a chance of having a positive node with a  sentinel lymph node biopsy and we will await the permanent pathology to make any other first further decisions in terms of her treatment. We discussed about a 1-2% risk lifetime of chronic shoulder pain as well as lymphedema associated with a sentinel lymph node biopsy.  We discussed the options for treatment of the breast cancer which included lumpectomy versus a mastectomy. We discussed the performance of the lumpectomy with a wire placement. We discussed a 5-10% chance of a positive margin requiring reexcision in the operating room. We also discussed that she may need radiation therapy and/or antiestrogen therapy if she undergoes lumpectomy. We discussed the mastectomy and the postoperative care for that as well. We discussed that there is no difference in her survival whether she undergoes lumpectomy with radiation therapy or antiestrogen therapy versus a mastectomy.   She understands her further therapy will be based on what her stages at the time of her operation.         Emory Leaver 09/19/2013, 1:16 PM    

## 2013-09-20 ENCOUNTER — Telehealth: Payer: Self-pay | Admitting: Obstetrics and Gynecology

## 2013-09-20 NOTE — Telephone Encounter (Signed)
Pt called to offer support for current br. Cancer care. Pt in good spirits.

## 2013-09-27 ENCOUNTER — Ambulatory Visit
Admission: RE | Admit: 2013-09-27 | Discharge: 2013-09-27 | Disposition: A | Discharge status: Home / Self Care | Payer: BC Managed Care – PPO | Source: Ambulatory Visit | Attending: General Surgery | Admitting: General Surgery

## 2013-09-27 DIAGNOSIS — R928 Other abnormal and inconclusive findings on diagnostic imaging of breast: Secondary | ICD-10-CM

## 2013-09-27 DIAGNOSIS — C50919 Malignant neoplasm of unspecified site of unspecified female breast: Secondary | ICD-10-CM | POA: Diagnosis not present

## 2013-09-27 MED ORDER — GADOBENATE DIMEGLUMINE 529 MG/ML IV SOLN
15.0000 mL | Freq: Once | INTRAVENOUS | Status: AC | PRN
  Administered 2013-09-27: 15 mL via INTRAVENOUS

## 2013-10-03 ENCOUNTER — Telehealth (INDEPENDENT_AMBULATORY_CARE_PROVIDER_SITE_OTHER): Payer: Self-pay

## 2013-10-03 ENCOUNTER — Ambulatory Visit (INDEPENDENT_AMBULATORY_CARE_PROVIDER_SITE_OTHER): Payer: BC Managed Care – PPO | Admitting: General Surgery

## 2013-10-03 ENCOUNTER — Encounter (INDEPENDENT_AMBULATORY_CARE_PROVIDER_SITE_OTHER): Payer: Self-pay | Admitting: General Surgery

## 2013-10-03 VITALS — BP 110/70 | HR 74 | Resp 16 | Ht 67.0 in | Wt 164.0 lb

## 2013-10-03 DIAGNOSIS — C50219 Malignant neoplasm of upper-inner quadrant of unspecified female breast: Secondary | ICD-10-CM

## 2013-10-03 NOTE — Telephone Encounter (Signed)
Called to get the date for the wire and seed placement for the pt's surgery. The pt is scheduled for 10/09/13 @[facility]S  wire loc at 8:30, nuc med 10:00, and CDS 10:30. Earnest Bailey wants the pt to arrive at 7:30 at [facility] for the seed placement along with the wire loc. The same day of surgery. Earnest Bailey is mailing pt info. In the mail. I have notified Colletta Maryland the surgery scheduler and she will also call the pt once more to finalize all the details.

## 2013-10-03 NOTE — Progress Notes (Signed)
Subjective:     Patient ID: [name], female   DOB: [birth year], 53 y.o.   MRN: [id number]  HPI This is a 53 year old female who in the second week of December noted a right breast mass on her self-exam. There's been no change since then. She has a prior history of removal of the benign disease on the left. She has no other history of any breast complaints. She has no family history of any breast or ovarian cancer. She underwent mammogram and ultrasound which showed a less than 1 cm mass in the right breast. This underwent a biopsy that showed a grade 1-2 invasive ductal carcinoma that is hormone receptor positive, HER-2/neu is not amplified, and the proliferation marker is 13%. She's also undergone an MRI which shows in the upper inner quadrant of the right breast at approximately 2:00 there is a 1.5 x 0.9 x 1.2 cm enhancing spiculated mass. Additionally the right breast lower and upper outer quarters there is some asymmetric non-mass and enhancement. The left breast is normal and there are no abnormal lymph nodes. Since I saw her last she has undergone biopsies of 2 areas on the right side. Both of these biopsies are benign. It did show fibrocystic change with pseudoangiomatous stromal hyperplasia present but there is no atypia or malignancy. She otherwise reports no change.  Review of Systems ADDENDUM:  Pathology revealed fibroadenomatoid changes and pseudoangiomatous  stromal hyperplasia (PASH) in the inferior portion of the right  breast. This was found to be concordant by Dr. Ulyess Blossom.  Pathology was relayed to the patient by telephone. The patient  reported bruising and tenderness at the biopsy site. Post biopsy  instructions were reviewed and her questions were answered. She was  encouraged to call [facility] for any  additional concerns. She was recently diagnosed with right breast  cancer and has an appointment with Dr. Rolm Bookbinder on October 03, 2013 to discuss her treatment plan.  Pathology results were reported by Susa Raring RN, BSN on September 30, 2013.  Electronically Signed  By: Everlean Alstrom M.D.  On: 09/30/2013 12:29       Study Result    CLINICAL DATA: 53 year old female recently diagnosed with invasive  ductal carcinoma of the right breast, with suspicious areas of  enhancement in the outer right breast.  EXAM:  MRI GUIDED CORE NEEDLE BIOPSY OF THE RIGHT BREAST  TECHNIQUE:  Multiplanar, multisequence MR imaging of the right breast was  performed both before and after administration of intravenous  contrast.  CONTRAST: 38m MULTIHANCE GADOBENATE DIMEGLUMINE 529 MG/ML IV SOLN  COMPARISON: Previous exams.  FINDINGS:  I met with the patient, and we discussed the procedure of MRI guided  biopsy, including risks, benefits, and alternatives. Specifically,  we discussed the risks of infection, bleeding, tissue injury, clip  migration, and inadequate sampling. Informed, written consent was  given. The usual time out protocol was performed immediately prior  to the procedure.  Using sterile technique, 2% Lidocaine, MRI guidance, and a 9 gauge  vacuum assisted device, biopsy was performed of the non mass  enhancement in the lower outer right breast (lesion 2) using a  lateral approach. At the conclusion of the procedure, a dumbbell  tissue marker clip was deployed into the biopsy cavity. Follow-up  2-view mammogram was performed and dictated separately.  IMPRESSION:  MRI guided biopsy of the non mass enhancement in the lower outer  right breast (lesion 2). No apparent  complications.  Electronically Signed:  By: Everlean Alstrom M.D.  EXAM:  BILATERAL BREAST MRI WITH AND WITHOUT CONTRAST  TECHNIQUE:  Multiplanar, multisequence MR images of both breasts were obtained  prior to and following the intravenous administration of 5m of  MultiHance.  THREE-DIMENSIONAL MR IMAGE RENDERING ON INDEPENDENT WORKSTATION:   Three-dimensional MR images were rendered by post-processing of the  original MR data on an independent workstation. The  three-dimensional MR images were interpreted, and findings are  reported in the following complete MRI report for this study. Three  dimensional images were evaluated at the independent DynaCad  workstation  COMPARISON: Previous exams  FINDINGS:  Breast composition: b. Scattered fibroglandular tissue  Background parenchymal enhancement: Moderate  Right breast: Within the upper-inner quadrant of the right breast,  approximately 2 o'clock position, there is a 1.5 x 0.9 x 1.2 cm  enhancing spiculated mass containing a biopsy marking clip  compatible with recently biopsied invasive ductal carcinoma.  Additionally within the right breast lower and upper outer quadrants  there is asymmetric non mass enhancement.  Left breast: No mass or abnormal enhancement.  Lymph nodes: No abnormal appearing lymph nodes.  Ancillary findings: None.  IMPRESSION:  1. Irregular spiculated mass within the upper inner right breast  containing central biopsy marking clip compatible with biopsy-proven  breast carcinoma.  2. Within the lower outer and upper outer quadrants of the right  breast there is asymmetric non mass enhancement. Further evaluation  with MRI guided core needle biopsy is recommended.       Objective:   Physical Exam Deferred     Assessment:     Clinical stage I right breast cancer     Plan:     Right breast wire-guided lumpectomy and right axillary sentinel lymph node biopsy  The other biopsies in the right side were both benign and I think we're going to proceed with breast conservation therapy. She is agreeable to this. I discussed with her the use of a radioactive seed in conjunction with the wire and she is agreeable to this also.  We discussed the staging and pathophysiology of breast cancer. We discussed all of the different options for treatment for  breast cancer including surgery, chemotherapy, radiation therapy, and antiestrogen therapy.  She understands decision for chemo will be after surgery and may be based on further testing.  [place]  I will get a postop appt with Dr NTressie Stalkerand rad onc there.  We discussed a sentinel lymph node biopsy as she does not appear to having lymph node involvement right now. We discussed the performance of that with injection of radioactive tracer and possibly blue dye. We discussed that she would have an incision underneath her axillary hairline. We discussed that there is a 5% chance of having a positive node with a sentinel lymph node biopsy and we will await the permanent pathology to make any other first further decisions in terms of her treatment. One of these options might be to return to the operating room to perform an axillary lymph node dissection. We discussed up to a 5% risk lifetime of chronic shoulder pain as well as lymphedema associated with a sentinel lymph node biopsy.  We discussed the options for treatment of the breast cancer which included lumpectomy versus a mastectomy. We discussed the performance of the lumpectomy with a wire placement. We discussed a 5% chance of a positive margin requiring reexcision  in the operating room. We also discussed that she may need radiation therapy or antiestrogen therapy or both if she undergoes lumpectomy. We discussed the mastectomy and the postoperative care for that as well. We discussed that there is no difference in her survival whether she undergoes lumpectomy with radiation therapy or antiestrogen therapy versus a mastectomy.  We discussed the risks of operation including bleeding, infection, possible reoperation. She understands her further therapy will be based on what her stages at the time of her operation.

## 2013-10-04 ENCOUNTER — Other Ambulatory Visit (INDEPENDENT_AMBULATORY_CARE_PROVIDER_SITE_OTHER): Payer: Self-pay | Admitting: General Surgery

## 2013-10-04 ENCOUNTER — Encounter (HOSPITAL_COMMUNITY)
Admission: RE | Admit: 2013-10-04 | Discharge: 2013-10-04 | Disposition: A | Discharge status: Home / Self Care | Payer: BC Managed Care – PPO | Source: Ambulatory Visit | Attending: General Surgery | Admitting: General Surgery

## 2013-10-04 ENCOUNTER — Encounter (HOSPITAL_BASED_OUTPATIENT_CLINIC_OR_DEPARTMENT_OTHER): Payer: Self-pay | Admitting: *Deleted

## 2013-10-04 DIAGNOSIS — Z01818 Encounter for other preprocedural examination: Secondary | ICD-10-CM | POA: Insufficient documentation

## 2013-10-04 DIAGNOSIS — C50219 Malignant neoplasm of upper-inner quadrant of unspecified female breast: Secondary | ICD-10-CM

## 2013-10-04 DIAGNOSIS — Z01812 Encounter for preprocedural laboratory examination: Secondary | ICD-10-CM | POA: Insufficient documentation

## 2013-10-04 LAB — CBC WITH DIFFERENTIAL/PLATELET
BASOS ABS: 0.1 10*3/uL (ref 0.0–0.1)
Basophils Relative: 1 % (ref 0–1)
Eosinophils Absolute: 0 10*3/uL (ref 0.0–0.7)
Eosinophils Relative: 1 % (ref 0–5)
HEMATOCRIT: 36.3 % (ref 36.0–46.0)
HEMOGLOBIN: 12.4 g/dL (ref 12.0–15.0)
LYMPHS ABS: 1.7 10*3/uL (ref 0.7–4.0)
LYMPHS PCT: 30 % (ref 12–46)
MCH: 31.3 pg (ref 26.0–34.0)
MCHC: 34.2 g/dL (ref 30.0–36.0)
MCV: 91.7 fL (ref 78.0–100.0)
Monocytes Absolute: 0.7 10*3/uL (ref 0.1–1.0)
Monocytes Relative: 13 % — ABNORMAL HIGH (ref 3–12)
Neutro Abs: 3 10*3/uL (ref 1.7–7.7)
Neutrophils Relative %: 55 % (ref 43–77)
PLATELETS: 319 10*3/uL (ref 150–400)
RBC: 3.96 MIL/uL (ref 3.87–5.11)
RDW: 14.9 % (ref 11.5–15.5)
WBC: 5.4 10*3/uL (ref 4.0–10.5)

## 2013-10-04 LAB — BASIC METABOLIC PANEL
BUN: 11 mg/dL (ref 6–23)
CO2: 26 mEq/L (ref 19–32)
Calcium: 10 mg/dL (ref 8.4–10.5)
Chloride: 103 mEq/L (ref 96–112)
Creatinine, Ser: 0.6 mg/dL (ref 0.50–1.10)
GFR calc Af Amer: >90 mL/min (ref >90)
GFR calc non Af Amer: >90 mL/min (ref >90)
Glucose, Bld: 93 mg/dL (ref 70–99)
POTASSIUM: 4.4 meq/L (ref 3.7–5.3)
Sodium: 140 mEq/L (ref 137–147)

## 2013-10-04 NOTE — Progress Notes (Signed)
To go to [facility] for [facility] labs-has not needed inderal for palpitations in maybe a yr

## 2013-10-09 ENCOUNTER — Ambulatory Visit (HOSPITAL_BASED_OUTPATIENT_CLINIC_OR_DEPARTMENT_OTHER): Payer: BC Managed Care – PPO | Admitting: Certified Registered"

## 2013-10-09 ENCOUNTER — Encounter (HOSPITAL_COMMUNITY)
Admission: RE | Admit: 2013-10-09 | Discharge: 2013-10-09 | Disposition: A | Discharge status: Home / Self Care | Payer: BC Managed Care – PPO | Source: Ambulatory Visit | Attending: General Surgery | Admitting: General Surgery

## 2013-10-09 ENCOUNTER — Ambulatory Visit (HOSPITAL_BASED_OUTPATIENT_CLINIC_OR_DEPARTMENT_OTHER)
Admission: RE | Admit: 2013-10-09 | Discharge: 2013-10-09 | Disposition: A | Discharge status: Home / Self Care | Payer: BC Managed Care – PPO | Source: Ambulatory Visit | Attending: General Surgery | Admitting: General Surgery

## 2013-10-09 ENCOUNTER — Encounter (HOSPITAL_BASED_OUTPATIENT_CLINIC_OR_DEPARTMENT_OTHER)
Admission: RE | Disposition: A | Discharge status: Home / Self Care | Payer: Self-pay | Source: Ambulatory Visit | Attending: General Surgery

## 2013-10-09 ENCOUNTER — Ambulatory Visit
Admission: RE | Admit: 2013-10-09 | Discharge: 2013-10-09 | Disposition: A | Discharge status: Home / Self Care | Payer: BC Managed Care – PPO | Source: Ambulatory Visit | Attending: General Surgery | Admitting: General Surgery

## 2013-10-09 ENCOUNTER — Encounter (HOSPITAL_BASED_OUTPATIENT_CLINIC_OR_DEPARTMENT_OTHER): Payer: Self-pay | Admitting: Certified Registered"

## 2013-10-09 ENCOUNTER — Encounter (HOSPITAL_BASED_OUTPATIENT_CLINIC_OR_DEPARTMENT_OTHER): Payer: BC Managed Care – PPO | Admitting: Certified Registered"

## 2013-10-09 DIAGNOSIS — C50219 Malignant neoplasm of upper-inner quadrant of unspecified female breast: Secondary | ICD-10-CM | POA: Insufficient documentation

## 2013-10-09 DIAGNOSIS — D0590 Unspecified type of carcinoma in situ of unspecified breast: Secondary | ICD-10-CM | POA: Diagnosis not present

## 2013-10-09 DIAGNOSIS — Z170 Estrogen receptor positive status [ER+]: Secondary | ICD-10-CM | POA: Diagnosis not present

## 2013-10-09 DIAGNOSIS — M549 Dorsalgia, unspecified: Secondary | ICD-10-CM | POA: Diagnosis not present

## 2013-10-09 DIAGNOSIS — Z79899 Other long term (current) drug therapy: Secondary | ICD-10-CM | POA: Diagnosis not present

## 2013-10-09 DIAGNOSIS — N644 Mastodynia: Secondary | ICD-10-CM | POA: Diagnosis not present

## 2013-10-09 DIAGNOSIS — G8918 Other acute postprocedural pain: Secondary | ICD-10-CM | POA: Diagnosis not present

## 2013-10-09 DIAGNOSIS — M542 Cervicalgia: Secondary | ICD-10-CM | POA: Diagnosis not present

## 2013-10-09 DIAGNOSIS — G8929 Other chronic pain: Secondary | ICD-10-CM | POA: Diagnosis not present

## 2013-10-09 DIAGNOSIS — C50919 Malignant neoplasm of unspecified site of unspecified female breast: Secondary | ICD-10-CM | POA: Diagnosis not present

## 2013-10-09 HISTORY — DX: Cardiac arrhythmia, unspecified: I49.9

## 2013-10-09 SURGERY — RADIOACTIVE SEED GUIDED PARTIAL MASTECTOMY WITH AXILLARY SENTINEL LYMPH NODE BIOPSY AND AXILLARY LYMPH NODE DISSECTION
Anesthesia: Regional | Site: Breast | Laterality: Right

## 2013-10-09 MED ORDER — CEFAZOLIN SODIUM 1-5 GM-% IV SOLN
INTRAVENOUS | Status: AC
  Filled 2013-10-09: qty 100

## 2013-10-09 MED ORDER — DEXAMETHASONE SODIUM PHOSPHATE 4 MG/ML IJ SOLN
INTRAMUSCULAR | Status: DC | PRN
Start: 2013-10-09 — End: 2013-10-09
  Administered 2013-10-09: 10 mg via INTRAVENOUS

## 2013-10-09 MED ORDER — TECHNETIUM TC 99M SULFUR COLLOID FILTERED
1.0000 | Freq: Once | INTRAVENOUS | Status: AC | PRN
  Administered 2013-10-09: 1 via INTRADERMAL

## 2013-10-09 MED ORDER — HYDROMORPHONE HCL PF 1 MG/ML IJ SOLN
0.2500 mg | INTRAMUSCULAR | Status: DC | PRN
  Administered 2013-10-09: 0.5 mg via INTRAVENOUS
  Administered 2013-10-09 (×3): 0.25 mg via INTRAVENOUS

## 2013-10-09 MED ORDER — OXYCODONE HCL 5 MG PO TABS
5.0000 mg | ORAL_TABLET | Freq: Once | ORAL | Status: AC | PRN
  Administered 2013-10-09: 5 mg via ORAL
  Filled 2013-10-09: qty 1

## 2013-10-09 MED ORDER — LIDOCAINE HCL (CARDIAC) 20 MG/ML IV SOLN
INTRAVENOUS | Status: DC | PRN
  Administered 2013-10-09: 30 mg via INTRAVENOUS

## 2013-10-09 MED ORDER — BUPIVACAINE HCL (PF) 0.25 % IJ SOLN
INTRAMUSCULAR | Status: AC
  Filled 2013-10-09: qty 30

## 2013-10-09 MED ORDER — ONDANSETRON HCL 4 MG/2ML IJ SOLN
INTRAMUSCULAR | Status: DC | PRN
  Administered 2013-10-09: 4 mg via INTRAVENOUS

## 2013-10-09 MED ORDER — PROPOFOL 10 MG/ML IV BOLUS
INTRAVENOUS | Status: AC
  Filled 2013-10-09: qty 20

## 2013-10-09 MED ORDER — METHYLENE BLUE 1 % INJ SOLN
INTRAMUSCULAR | Status: AC
  Filled 2013-10-09: qty 10

## 2013-10-09 MED ORDER — MIDAZOLAM HCL 5 MG/5ML IJ SOLN
INTRAMUSCULAR | Status: DC | PRN
  Administered 2013-10-09: 2 mg via INTRAVENOUS

## 2013-10-09 MED ORDER — MIDAZOLAM HCL 2 MG/2ML IJ SOLN
INTRAMUSCULAR | Status: AC
  Filled 2013-10-09: qty 2

## 2013-10-09 MED ORDER — FENTANYL CITRATE 0.05 MG/ML IJ SOLN
INTRAMUSCULAR | Status: AC
  Filled 2013-10-09: qty 2

## 2013-10-09 MED ORDER — HYDROMORPHONE HCL PF 1 MG/ML IJ SOLN
INTRAMUSCULAR | Status: AC
  Filled 2013-10-09: qty 1

## 2013-10-09 MED ORDER — ROPIVACAINE HCL 5 MG/ML IJ SOLN
INTRAMUSCULAR | Status: DC | PRN
  Administered 2013-10-09: 30 mL

## 2013-10-09 MED ORDER — PROPOFOL 10 MG/ML IV BOLUS
INTRAVENOUS | Status: DC | PRN
  Administered 2013-10-09: 100 mg via INTRAVENOUS

## 2013-10-09 MED ORDER — OXYCODONE HCL 5 MG/5ML PO SOLN: 5.0000 mg | Freq: Once | ORAL | Status: AC | PRN

## 2013-10-09 MED ORDER — FENTANYL CITRATE 0.05 MG/ML IJ SOLN
INTRAMUSCULAR | Status: DC | PRN
  Administered 2013-10-09: 50 ug via INTRAVENOUS

## 2013-10-09 MED ORDER — FENTANYL CITRATE 0.05 MG/ML IJ SOLN
50.0000 ug | INTRAMUSCULAR | Status: DC | PRN
  Administered 2013-10-09: 100 ug via INTRAVENOUS

## 2013-10-09 MED ORDER — CEFAZOLIN SODIUM-DEXTROSE 2-3 GM-% IV SOLR
2.0000 g | INTRAVENOUS | Status: AC
  Administered 2013-10-09: 2 g via INTRAVENOUS

## 2013-10-09 MED ORDER — LACTATED RINGERS IV SOLN
INTRAVENOUS | Status: DC
  Administered 2013-10-09 (×2): via INTRAVENOUS

## 2013-10-09 MED ORDER — MIDAZOLAM HCL 2 MG/2ML IJ SOLN
1.0000 mg | INTRAMUSCULAR | Status: DC | PRN
  Administered 2013-10-09: 2 mg via INTRAVENOUS

## 2013-10-09 MED ORDER — OXYCODONE-ACETAMINOPHEN 10-325 MG PO TABS: 1.0000 | ORAL_TABLET | Freq: Four times a day (QID) | ORAL | Status: DC | PRN

## 2013-10-09 MED ORDER — SODIUM CHLORIDE 0.9 % IJ SOLN
INTRAMUSCULAR | Status: AC
  Filled 2013-10-09: qty 10

## 2013-10-09 MED ORDER — FENTANYL CITRATE 0.05 MG/ML IJ SOLN
INTRAMUSCULAR | Status: AC
  Filled 2013-10-09: qty 6

## 2013-10-09 SURGICAL SUPPLY — 62 items
ADH SKN CLS APL DERMABOND .7 (GAUZE/BANDAGES/DRESSINGS)
APL SKNCLS STERI-STRIP NONHPOA (GAUZE/BANDAGES/DRESSINGS)
APPLIER CLIP 9.375 MED OPEN (MISCELLANEOUS) ×2
APR CLP MED 9.3 20 MLT OPN (MISCELLANEOUS) ×1
BENZOIN TINCTURE PRP APPL 2/3 (GAUZE/BANDAGES/DRESSINGS) IMPLANT
BLADE SURG 15 STRL LF DISP TIS (BLADE) ×1 IMPLANT
BLADE SURG 15 STRL SS (BLADE) ×2
CANISTER SUC SOCK COL 7 IN (MISCELLANEOUS) ×1 IMPLANT
CANISTER SUCT 1200ML W/VALVE (MISCELLANEOUS) ×2 IMPLANT
CHLORAPREP W/TINT 26ML (MISCELLANEOUS) ×2 IMPLANT
CLIP APPLIE 9.375 MED OPEN (MISCELLANEOUS) ×1 IMPLANT
COVER MAYO STAND STRL (DRAPES) ×2 IMPLANT
COVER PROBE W GEL 5X96 (DRAPES) ×1 IMPLANT
COVER TABLE BACK 60X90 (DRAPES) ×2 IMPLANT
DECANTER SPIKE VIAL GLASS SM (MISCELLANEOUS) IMPLANT
DERMABOND ADVANCED (GAUZE/BANDAGES/DRESSINGS)
DERMABOND ADVANCED .7 DNX12 (GAUZE/BANDAGES/DRESSINGS) IMPLANT
DEVICE DUBIN W/COMP PLATE 8390 (MISCELLANEOUS) ×2 IMPLANT
DRAIN CHANNEL 19F RND (DRAIN) ×2 IMPLANT
DRAPE LAPAROSCOPIC ABDOMINAL (DRAPES) ×2 IMPLANT
DRSG TEGADERM 4X4.75 (GAUZE/BANDAGES/DRESSINGS) ×2 IMPLANT
ELECT COATED BLADE 2.86 ST (ELECTRODE) ×2 IMPLANT
ELECT REM PT RETURN 9FT ADLT (ELECTROSURGICAL) ×2
ELECTRODE REM PT RTRN 9FT ADLT (ELECTROSURGICAL) ×1 IMPLANT
EVACUATOR SILICONE 100CC (DRAIN) ×2 IMPLANT
GLOVE BIO SURGEON STRL SZ7 (GLOVE) ×4 IMPLANT
GLOVE BIOGEL PI IND STRL 7.5 (GLOVE) ×1 IMPLANT
GLOVE BIOGEL PI INDICATOR 7.5 (GLOVE) ×2
GLOVE EXAM NITRILE MD LF STRL (GLOVE) ×1 IMPLANT
GOWN STRL REUS W/ TWL LRG LVL3 (GOWN DISPOSABLE) ×3 IMPLANT
GOWN STRL REUS W/TWL LRG LVL3 (GOWN DISPOSABLE) ×4
KIT MARKER MARGIN INK (KITS) ×2 IMPLANT
NDL HYPO 25X1 1.5 SAFETY (NEEDLE) ×1 IMPLANT
NEEDLE HYPO 25X1 1.5 SAFETY (NEEDLE) ×2 IMPLANT
NS IRRIG 1000ML POUR BTL (IV SOLUTION) ×2 IMPLANT
PACK BASIN DAY SURGERY FS (CUSTOM PROCEDURE TRAY) ×2 IMPLANT
PENCIL BUTTON HOLSTER BLD 10FT (ELECTRODE) ×2 IMPLANT
PIN SAFETY STERILE (MISCELLANEOUS) ×2 IMPLANT
SHEET MEDIUM DRAPE 40X70 STRL (DRAPES) ×2 IMPLANT
SLEEVE SCD COMPRESS KNEE MED (MISCELLANEOUS) ×2 IMPLANT
SPONGE GAUZE 4X4 12PLY STER LF (GAUZE/BANDAGES/DRESSINGS) IMPLANT
SPONGE LAP 4X18 X RAY DECT (DISPOSABLE) ×2 IMPLANT
STAPLER VISISTAT 35W (STAPLE) ×2 IMPLANT
STOCKINETTE IMPERVIOUS LG (DRAPES) ×2 IMPLANT
STRIP CLOSURE SKIN 1/2X4 (GAUZE/BANDAGES/DRESSINGS) IMPLANT
SUT ETHILON 2 0 FS 18 (SUTURE) ×1 IMPLANT
SUT MNCRL AB 4-0 PS2 18 (SUTURE) ×3 IMPLANT
SUT MON AB 5-0 PS2 18 (SUTURE) IMPLANT
SUT SILK 2 0 SH (SUTURE) ×1 IMPLANT
SUT VIC AB 2-0 SH 27 (SUTURE) ×8
SUT VIC AB 2-0 SH 27XBRD (SUTURE) ×1 IMPLANT
SUT VIC AB 3-0 SH 27 (SUTURE) ×4
SUT VIC AB 3-0 SH 27X BRD (SUTURE) ×1 IMPLANT
SUT VIC AB 5-0 PS2 18 (SUTURE) IMPLANT
SUT VICRYL AB 2 0 TIE (SUTURE) IMPLANT
SUT VICRYL AB 2 0 TIES (SUTURE)
SUT VICRYL AB 3 0 TIES (SUTURE) IMPLANT
SYR CONTROL 10ML LL (SYRINGE) ×2 IMPLANT
TOWEL OR 17X24 6PK STRL BLUE (TOWEL DISPOSABLE) ×2 IMPLANT
TOWEL OR NON WOVEN STRL DISP B (DISPOSABLE) ×2 IMPLANT
TUBE CONNECTING 20X1/4 (TUBING) ×2 IMPLANT
YANKAUER SUCT BULB TIP NO VENT (SUCTIONS) ×2 IMPLANT

## 2013-10-09 NOTE — Discharge Instructions (Signed)
[facility] °Office Phone Number [phone] ° °BREAST BIOPSY/ PARTIAL MASTECTOMY: POST OP INSTRUCTIONS ° °Always review your discharge instruction sheet given to you by the facility where your surgery was performed. ° °IF YOU HAVE DISABILITY OR FAMILY LEAVE FORMS, YOU MUST BRING THEM TO THE OFFICE FOR PROCESSING.  DO NOT GIVE THEM TO YOUR DOCTOR. ° °1. A prescription for pain medication may be given to you upon discharge.  Take your pain medication as prescribed, if needed.  If narcotic pain medicine is not needed, then you may take acetaminophen (Tylenol), naprosyn (Alleve) or ibuprofen (Advil) as needed. °2. Take your usually prescribed medications unless otherwise directed °3. If you need a refill on your pain medication, please contact your pharmacy.  They will contact our office to request authorization.  Prescriptions will not be filled after 5pm or on week-ends. °4. You should eat very light the first 24 hours after surgery, such as soup, crackers, pudding, etc.  Resume your normal diet the day after surgery. °5. Most patients will experience some swelling and bruising in the breast.  Ice packs and a good support bra will help.  Wear the breast binder provided or a sports bra for 72 hours day and night.  After that wear a sports bra during the day until you return to the office. Swelling and bruising can take several days to resolve.  °6. It is common to experience some constipation if taking pain medication after surgery.  Increasing fluid intake and taking a stool softener will usually help or prevent this problem from occurring.  A mild laxative (Milk of Magnesia or Miralax) should be taken according to package directions if there are no bowel movements after 48 hours. °7. Unless discharge instructions indicate otherwise, you may remove your bandages 48 hours after surgery and you may shower at that time.  You may have steri-strips (small skin tapes) in place directly over the incision.   These strips should be left on the skin for 7-10 days and will come off on their own.  If your surgeon used skin glue on the incision, you may shower in 24 hours.  The glue will flake off over the next 2-3 weeks.  Any sutures or staples will be removed at the office during your follow-up visit. °8. ACTIVITIES:  You may resume regular daily activities (gradually increasing) beginning the next day.  Wearing a good support bra or sports bra minimizes pain and swelling.  You may have sexual intercourse when it is comfortable. °a. You may drive when you no longer are taking prescription pain medication, you can comfortably wear a seatbelt, and you can safely maneuver your car and apply brakes. °b. RETURN TO WORK:  ______________________________________________________________________________________ °9. You should see your doctor in the office for a follow-up appointment approximately two weeks after your surgery.  Your doctor’s nurse will typically make your follow-up appointment when she calls you with your pathology report.  Expect your pathology report 3-4 business days after your surgery.  You may call to check if you do not hear from us after three days. °10. OTHER INSTRUCTIONS: _______________________________________________________________________________________________ _____________________________________________________________________________________________________________________________________ °_____________________________________________________________________________________________________________________________________ °_____________________________________________________________________________________________________________________________________ ° °WHEN TO CALL DR WAKEFIELD: °1. Fever over 101.0 °2. Nausea and/or vomiting. °3. Extreme swelling or bruising. °4. Continued bleeding from incision. °5. Increased pain, redness, or drainage from the incision. ° °The clinic staff is available to  answer your questions during regular business hours.  Please don’t hesitate to call and ask to speak to one of the nurses for   clinical concerns.  If you have a medical emergency, go to the nearest emergency room or call 911.  A surgeon from [facility] is always on call at the hospital. ° °For further questions, please visit [email or web address] ° °Post Anesthesia Home Care Instructions ° °Activity: °Get plenty of rest for the remainder of the day. A responsible adult should stay with you for 24 hours following the procedure.  °For the next 24 hours, DO NOT: °-Drive a car °-Operate machinery °-Drink alcoholic beverages °-Take any medication unless instructed by your physician °-Make any legal decisions or sign important papers. ° °Meals: °Start with liquid foods such as gelatin or soup. Progress to regular foods as tolerated. Avoid greasy, spicy, heavy foods. If nausea and/or vomiting occur, drink only clear liquids until the nausea and/or vomiting subsides. Call your physician if vomiting continues. ° °Special Instructions/Symptoms: °Your throat may feel dry or sore from the anesthesia or the breathing tube placed in your throat during surgery. If this causes discomfort, gargle with warm salt water. The discomfort should disappear within 24 hours. ° °

## 2013-10-09 NOTE — Anesthesia Postprocedure Evaluation (Signed)
  Anesthesia Post-op Note  Patient: [name]  Procedure(s) Performed: Procedure(s): RIGHT BREAST WIRE GUIDED LUMPECTOMY WITH RADIOACTIVE SEED AND RIGHT AXILLARY SENTINEL NODE BIOPSY (Right)  Patient Location: PACU  Anesthesia Type:General  Level of Consciousness: awake and alert   Airway and Oxygen Therapy: Patient Spontanous Breathing  Post-op Pain: mild  Post-op Assessment: Post-op Vital signs reviewed  Post-op Vital Signs: stable  Complications: No apparent anesthesia complications

## 2013-10-09 NOTE — Progress Notes (Signed)
Assisted Dr. Ola Spurr with right, ultrasound guided, pectoralis block. Side rails up, monitors on throughout procedure. See vital signs in flow sheet. Tolerated Procedure well.

## 2013-10-09 NOTE — Transfer of Care (Signed)
Immediate Anesthesia Transfer of Care Note  Patient: [name]  Procedure(s) Performed: Procedure(s): RIGHT BREAST WIRE GUIDED LUMPECTOMY WITH RADIOACTIVE SEED AND RIGHT AXILLARY SENTINEL NODE BIOPSY (Right)  Patient Location: PACU  Anesthesia Type:GA combined with regional for post-op pain  Level of Consciousness: awake and patient cooperative  Airway & Oxygen Therapy: Patient Spontanous Breathing and Patient connected to face mask oxygen  Post-op Assessment: Report given to PACU RN and Post -op Vital signs reviewed and stable  Post vital signs: Reviewed and stable  Complications: No apparent anesthesia complications

## 2013-10-09 NOTE — Anesthesia Preprocedure Evaluation (Addendum)
Anesthesia Evaluation  Patient identified by MRN, date of birth, ID band Patient awake    Reviewed: Allergy & Precautions, H&P , NPO status , Patient's Chart, lab work & pertinent test results, reviewed documented beta blocker date and time   Airway Mallampati: II TM Distance: >3 FB Neck ROM: Full    Dental no notable dental hx. (+) Teeth Intact, Chipped and Dental Advisory Given   Pulmonary neg pulmonary ROS,  breath sounds clear to auscultation  Pulmonary exam normal       Cardiovascular + dysrhythmias Rhythm:Regular Rate:Normal     Neuro/Psych negative neurological ROS  negative psych ROS   GI/Hepatic negative GI ROS, Neg liver ROS,   Endo/Other  negative endocrine ROS  Renal/GU negative Renal ROS  negative genitourinary   Musculoskeletal   Abdominal   Peds  Hematology negative hematology ROS (+) anemia ,   Anesthesia Other Findings   Reproductive/Obstetrics negative OB ROS                         Anesthesia Physical Anesthesia Plan  ASA: II  Anesthesia Plan: General and Regional   Post-op Pain Management:    Induction: Intravenous  Airway Management Planned: LMA  Additional Equipment:   Intra-op Plan:   Post-operative Plan: Extubation in OR  Informed Consent: I have reviewed the patients History and Physical, chart, labs and discussed the procedure including the risks, benefits and alternatives for the proposed anesthesia with the patient or authorized representative who has indicated his/her understanding and acceptance.   Dental advisory given  Plan Discussed with: CRNA  Anesthesia Plan Comments:         Anesthesia Quick Evaluation

## 2013-10-09 NOTE — H&P (View-Only) (Signed)
Patient ID: [name], female   DOB: 04/30/1961, 53 y.o.   MRN: [id number]  Chief Complaint  Patient presents with  . Other    new br    HPI [name] is a 53 y.o. female.  Referred by Dr Gerarda Fraction HPI This is a 53 year old female who in the second week of December noted a right breast mass on her self-exam. There's been no change since then. She has a prior history of removal of the benign disease on the left. She has no other history of any breast complaints. She has no family history of any breast or ovarian cancer. She underwent mammogram and ultrasound which showed a less than 1 cm mass in the right breast. This underwent a biopsy that showed a grade 1-2 invasive ductal carcinoma that is hormone receptor positive, HER-2/neu is not amplified, and the proliferation marker is 13%. She's also undergone an MRI which shows in the upper inner quadrant of the right breast at approximately 2:00 there is a 1.5 x 0.9 x 1.2 cm enhancing spiculated mass. Additionally the right breast lower and upper outer quarters there is some asymmetric non-mass and enhancement. The left breast is normal and there are no abnormal lymph nodes. She comes in to discuss her options today.  Past Medical History  Diagnosis Date  . Chronic neck pain   . Chronic back pain   . Anemia   . Arthritis     Past Surgical History  Procedure Laterality Date  . Cesarean section      x2  . Breast surgery      left partail mastectomy-cyst  . Ankle surgery      left ankle cyst  . Cervical disc surgery    . Back surgery      lumbar disc surgery  . Tubal ligation      with last c-section  . Dilitation & currettage/hystroscopy with thermachoice ablation  07/10/2012    Procedure: [place];  Surgeon: [name], MD;  Location: [facility];  Service: Gynecology;  Laterality: N/A;  D5 9m in, 13 ml out, temp 87degree celcius, total therapy time 976m 16sec    History reviewed. No  pertinent family history.  Social History History  Substance Use Topics  . Smoking status: Never Smoker   . Smokeless tobacco: Not on file  . Alcohol Use: No    No Known Allergies  Current Outpatient Prescriptions  Medication Sig Dispense Refill  . ALPRAZolam (XANAX) 0.5 MG tablet Take 0.5 mg by mouth 3 (three) times daily as needed. For anxiety      . amitriptyline (ELAVIL) 25 MG tablet Take 50 mg by mouth at bedtime.      . cyclobenzaprine (FLEXERIL) 10 MG tablet Take 10 mg by mouth 3 (three) times daily as needed. For muscle spasms      . diphenhydrAMINE (BENADRYL) 25 MG tablet Take 50 mg by mouth every 6 (six) hours as needed. For allergies      . HYDROcodone-acetaminophen (LORTAB) 10-500 MG per tablet Take 1 tablet by mouth every 6 (six) hours as needed. For pain      . Multiple Vitamin (MULTIVITAMIN WITH MINERALS) TABS Take 1 tablet by mouth daily.      . Marland Kitchenetorolac (TORADOL) 10 MG tablet Take 1 tablet (10 mg total) by mouth every 6 (six) hours as needed for pain.  20 tablet  0  . oxyCODONE-acetaminophen (PERCOCET/ROXICET) 5-325 MG per tablet Take 1-2 tablets by mouth  every 6 (six) hours as needed.  15 tablet  0  . propranolol ER (INDERAL LA) 60 MG 24 hr capsule Take 1 capsule (60 mg total) by mouth daily.  30 capsule  0   No current facility-administered medications for this visit.    Review of Systems Review of Systems  Constitutional: Negative for fever, chills and unexpected weight change.  HENT: Negative for congestion, hearing loss, sore throat, trouble swallowing and voice change.   Eyes: Negative for visual disturbance.  Respiratory: Negative for cough and wheezing.   Cardiovascular: Negative for chest pain, palpitations and leg swelling.  Gastrointestinal: Negative for nausea, vomiting, abdominal pain, diarrhea, constipation, blood in stool, abdominal distention and anal bleeding.  Genitourinary: Negative for hematuria, vaginal bleeding and difficulty urinating.    Musculoskeletal: Negative for arthralgias.  Skin: Negative for rash and wound.  Neurological: Negative for seizures, syncope and headaches.  Hematological: Negative for adenopathy. Does not bruise/bleed easily.  Psychiatric/Behavioral: Negative for confusion.    Blood pressure 114/70, pulse 70, resp. rate 16, height _0  (1.702 m), weight 162 lb (73.483 kg), last menstrual period 08/27/2013.  Physical Exam Physical Exam  Vitals reviewed. Constitutional: She appears well-developed and well-nourished.  HENT:  Head: Normocephalic and atraumatic.  Neck: Neck supple.  Cardiovascular: Normal rate, regular rhythm and normal heart sounds.   Pulmonary/Chest: Effort normal and breath sounds normal. She has no wheezes. She has no rales. Right breast exhibits mass. Right breast exhibits no inverted nipple, no nipple discharge, no skin change and no tenderness. Left breast exhibits no inverted nipple, no mass, no nipple discharge, no skin change and no tenderness.    Lymphadenopathy:    She has no cervical adenopathy.    She has no axillary adenopathy.       Right: No supraclavicular adenopathy present.       Left: No supraclavicular adenopathy present.    Data Reviewed BILATERAL BREAST MRI WITH AND WITHOUT CONTRAST  TECHNIQUE:  Multiplanar, multisequence MR images of both breasts were obtained  prior to and following the intravenous administration of 70m of  MultiHance.  THREE-DIMENSIONAL MR IMAGE RENDERING ON INDEPENDENT WORKSTATION:  Three-dimensional MR images were rendered by post-processing of the  original MR data on an independent workstation. The  three-dimensional MR images were interpreted, and findings are  reported in the following complete MRI report for this study. Three  dimensional images were evaluated at the independent DynaCad  workstation  COMPARISON: Previous exams  FINDINGS:  Breast composition: b. Scattered fibroglandular tissue  Background parenchymal  enhancement: Moderate  Right breast: Within the upper-inner quadrant of the right breast,  approximately 2 o'clock position, there is a 1.5 x 0.9 x 1.2 cm  enhancing spiculated mass containing a biopsy marking clip  compatible with recently biopsied invasive ductal carcinoma.  Additionally within the right breast lower and upper outer quadrants  there is asymmetric non mass enhancement.  Left breast: No mass or abnormal enhancement.  Lymph nodes: No abnormal appearing lymph nodes.  Ancillary findings: None.  IMPRESSION:  1. Irregular spiculated mass within the upper inner right breast  containing central biopsy marking clip compatible with biopsy-proven  breast carcinoma.  2. Within the lower outer and upper outer quadrants of the right  breast there is asymmetric non mass enhancement. Further evaluation  with MRI guided core needle biopsy is recommended.  RECOMMENDATION:  MRI guided core needle biopsy of asymmetric non mass enhancement  within the upper-outer and lower outer quadrant of the right breast.  The most suspicious area should be targeted at the time of biopsy.  BI-RADS CATEGORY 4: Suspicious abnormality - biopsy should be  considered.   Assessment    Clinical stage I right breast cancer    Plan    MR biopsy of right breast NME prior to any further decision making   We discussed the staging and pathophysiology of breast cancer. We discussed all of the different options for treatment for breast cancer including surgery, chemotherapy, radiation therapy, Herceptin, and antiestrogen therapy. We reviewed her pathology.  We discussed a sentinel lymph node biopsy as she does not appear to having lymph node involvement right now. We discussed the performance of that with injection of radioactive tracer and possibly blue dye. We discussed that she would have an incision underneath her axillary hairline. We discussed that there is a chance of having a positive node with a  sentinel lymph node biopsy and we will await the permanent pathology to make any other first further decisions in terms of her treatment. We discussed about a 1-2% risk lifetime of chronic shoulder pain as well as lymphedema associated with a sentinel lymph node biopsy.  We discussed the options for treatment of the breast cancer which included lumpectomy versus a mastectomy. We discussed the performance of the lumpectomy with a wire placement. We discussed a 5-10% chance of a positive margin requiring reexcision in the operating room. We also discussed that she may need radiation therapy and/or antiestrogen therapy if she undergoes lumpectomy. We discussed the mastectomy and the postoperative care for that as well. We discussed that there is no difference in her survival whether she undergoes lumpectomy with radiation therapy or antiestrogen therapy versus a mastectomy.   She understands her further therapy will be based on what her stages at the time of her operation.         Kailei Cowens 09/19/2013, 1:16 PM

## 2013-10-09 NOTE — Anesthesia Procedure Notes (Addendum)
Anesthesia Regional Block:  Pectoralis block  Pre-Anesthetic Checklist: ,, timeout performed, Correct Patient, Correct Site, Correct Laterality, Correct Procedure, Correct Position, site marked, Risks and benefits discussed, pre-op evaluation, post-op pain management  Laterality: Right  Prep: chloraprep       Needles:  Injection technique: Single-shot  Needle Type: Echogenic Stimulator Needle      Needle Gauge: 21 and 21 G    Additional Needles:  Procedures: ultrasound guided (picture in chart) Pectoralis block Narrative:  Start time: 10/09/2013 10:30 AM End time: 10/09/2013 10:40 AM Injection made incrementally with aspirations every 5 mL.  Performed by: Personally  Anesthesiologist: Fitzgerald,MD  Additional Notes: 2% Lidocaine for skin.   Procedure Name: LMA Insertion Date/Time: 10/09/2013 10:52 AM Performed by: Macie Baum Pre-anesthesia Checklist: Patient identified, Emergency Drugs available, Suction available and Patient being monitored Patient Re-evaluated:Patient Re-evaluated prior to inductionOxygen Delivery Method: Circle System Utilized Preoxygenation: Pre-oxygenation with 100% oxygen Intubation Type: IV induction Ventilation: Mask ventilation without difficulty LMA: LMA inserted LMA Size: 4.0 Number of attempts: 1 Airway Equipment and Method: bite block Placement Confirmation: positive ETCO2 Tube secured with: Tape Dental Injury: Teeth and Oropharynx as per pre-operative assessment

## 2013-10-09 NOTE — Interval H&P Note (Signed)
History and Physical Interval Note:  10/09/2013 10:36 AM  [name]  has presented today for surgery, with the diagnosis of right breast cancer  The various methods of treatment have been discussed with the patient and family. After consideration of risks, benefits and other options for treatment, the patient has consented to  Procedure(s): RIGHT BREAST WIRE GUIDED LUMPECTOMY WITH RADIOACTIVE SEED AND RIGHT AXILLARY SENTINEL NODE BIOPSY (Right) as a surgical intervention .  The patient's history has been reviewed, patient examined, no change in status, stable for surgery.  I have reviewed the patient's chart and labs.  Questions were answered to the patient's satisfaction.     Jaylenn Altier

## 2013-10-09 NOTE — Progress Notes (Signed)
Emotional support during breast injections °

## 2013-10-09 NOTE — Op Note (Signed)
Preoperative diagnosis:  Clinical stage I right breast cancer Postoperative diagnosis: same as above Procedure: 1. Right breast radioactive seed guided partial mastectomy #2 right axillary sentinel lymph node biopsy Surgeon: Dr. Serita Grammes Anesthesia: Gen. With LMA and pectoral block Estimated blood loss: Minimal Consultations: None Drains: None Specimens: #1 right breast tissue marked with paint #2 additional posterior margin marked short stitch superior, long stitch lateral, double stitch deep #3 additional inferior margin marked as above Social counts correct the completion Decision to recovery stable  Indications: This 53 yo female who presents with a palpable right breast mass. This was biopsied and is an  invasive ductal carcinoma. She has no evidence of lymph node involvement. We discussed all of her options and decided proceed with a partial mastectomy and sentinel lymph node biopsy.  Procedure: After informed consent was obtained the patient's first to the breast center. She had a wire placed in the lesion as well as a radioactive seed. I had these mammograms available in the operating room on the monitor. She was then brought to day surgery and was injected with technetium in the standard periareolar fashion. She then underwent a pectoral block with anesthesia. She was given cefazolin. Sequential compression devices were placed. I confirmed the seed was in her breast prior to beginning.She was administered general anesthesia with an LMA. Her right breast and axilla were prepped and draped in the standard sterile surgical fashion.  I made a curvilinear incision after I identified the location of the seed in the right breast. I brought the wire in from its remote position. I then used cautery as well as the neoprobe to guide the directed excision with an attempt to get clear margins. I removed this and painted it. A mammogram was taken in the operating room which confirmed removal of  the clip, wire, and radioactive seed. This was confirmed by radiology. Presence of the radioactive seed was also confirmed using the neoprobe after excision. There was no more background radioactivity at this site. I then had this examined by pathology. They were concerned to be close up in my anterior inferior and posterior margins. The posterior margin was excised down to the pectoralis muscle. I excised the inferior margin. The anterior margin is only skin at this point. I then placed clips around the cavity. I closed this with 2-0 Vicryl, 3-0 Vicryl, 4 Monocryl, and Dermabond and Steri-Strips.  I then made a 2 cm incision below the axillary hairline. I carried this to the axillary fascia. 2 lymph nodes were identified as radioactive. These counts were 300 and 724. There was no background radioactivity. These were sent to pathology also. I then closed with 2-0 Vicryl, 3-0Vicryl, and 4-0 Monocryl. A dressing was placed over this. She tolerated this well was extubated and transferred to recovery stable.

## 2013-10-11 ENCOUNTER — Other Ambulatory Visit (INDEPENDENT_AMBULATORY_CARE_PROVIDER_SITE_OTHER): Payer: Self-pay | Admitting: General Surgery

## 2013-10-11 DIAGNOSIS — C50219 Malignant neoplasm of upper-inner quadrant of unspecified female breast: Secondary | ICD-10-CM

## 2013-10-22 DIAGNOSIS — C50919 Malignant neoplasm of unspecified site of unspecified female breast: Secondary | ICD-10-CM | POA: Diagnosis not present

## 2013-10-22 DIAGNOSIS — Z170 Estrogen receptor positive status [ER+]: Secondary | ICD-10-CM | POA: Diagnosis not present

## 2013-10-24 DIAGNOSIS — Z170 Estrogen receptor positive status [ER+]: Secondary | ICD-10-CM | POA: Diagnosis not present

## 2013-10-24 DIAGNOSIS — Z806 Family history of leukemia: Secondary | ICD-10-CM | POA: Diagnosis not present

## 2013-10-24 DIAGNOSIS — Z79899 Other long term (current) drug therapy: Secondary | ICD-10-CM | POA: Diagnosis not present

## 2013-10-24 DIAGNOSIS — C50919 Malignant neoplasm of unspecified site of unspecified female breast: Secondary | ICD-10-CM | POA: Diagnosis not present

## 2013-10-29 ENCOUNTER — Ambulatory Visit (INDEPENDENT_AMBULATORY_CARE_PROVIDER_SITE_OTHER): Payer: BC Managed Care – PPO | Admitting: General Surgery

## 2013-10-29 ENCOUNTER — Encounter (INDEPENDENT_AMBULATORY_CARE_PROVIDER_SITE_OTHER): Payer: Self-pay | Admitting: General Surgery

## 2013-10-29 VITALS — BP 110/68 | HR 100 | Temp 98.2°F | Resp 14 | Ht 67.0 in | Wt 163.8 lb

## 2013-10-29 DIAGNOSIS — Z09 Encounter for follow-up examination after completed treatment for conditions other than malignant neoplasm: Secondary | ICD-10-CM

## 2013-10-29 NOTE — Progress Notes (Signed)
Subjective:     Patient ID: [name], female   DOB: Oct 07, 1960, 52 y.o.   MRN: [id number]  HPI 53 year old female who underwent a radioactive seed guided right breast lumpectomy and sentinel node biopsy. She is recovering well and complains of only some pain at the sites right now. She is otherwise moving her arm well. She has been seen by both medical and radiation oncology in [place]. She is due to begin radiation therapy at the beginning of March. We discussed her pathology today. Her pathology shows 2 sentinel nodes that are negative. She has a 1.5 cm invasive ductal carcinoma that her eventual margins were all clear. This is hormone receptor positive and HER-2/neu negative. This is a stage I breast cancer provider ectopic if her pathology today.  Review of Systems     Objective:   Physical Exam Healing left breast and axillary incisions, no infection    Assessment:     Stage I left breast cancer     Plan:     She is doing well I released her full activity. I will see her back in 6 months. She will begin radiation therapy and continue to see medical oncology as planned. I gave her instructions to go to the after breast cancer physical therapy class.

## 2013-10-29 NOTE — Patient Instructions (Signed)
      ABC CLASS After Breast Cancer Class  After Breast Cancer Class is a specially designed exercise class to assist you in a safe recovery after having breast cancer surgery.  In this class you will learn how to get back to full function whether your drains were just removed or if you had surgery a month ago.  This one-time class is held the 1st and 3rd Monday of every month from 11:00 a.m. until 12:00 noon at the [facility] located at [facility].  This class is FREE and space is limited.  For more information or to register for the next available class, call [phone].  Class Goals   Understand specific stretches to improve the flexibility of your chest and shoulder.   Learn ways to safely strengthen your upper body and improve your posture.   Understand the warning signs of infection and why you may be at risk for an arm infection.   Learn about Lymphedema and prevention.  **You do not attend this class until after surgery.  Drains must be removed to participate.     Serafina Royals, PT, CLT Leone Payor, PT, CLT

## 2013-10-30 DIAGNOSIS — Z79899 Other long term (current) drug therapy: Secondary | ICD-10-CM | POA: Diagnosis not present

## 2013-10-30 DIAGNOSIS — Z806 Family history of leukemia: Secondary | ICD-10-CM | POA: Diagnosis not present

## 2013-10-30 DIAGNOSIS — C50919 Malignant neoplasm of unspecified site of unspecified female breast: Secondary | ICD-10-CM | POA: Diagnosis not present

## 2013-10-30 DIAGNOSIS — Z170 Estrogen receptor positive status [ER+]: Secondary | ICD-10-CM | POA: Diagnosis not present

## 2013-11-04 DIAGNOSIS — Z510 Encounter for antineoplastic radiation therapy: Secondary | ICD-10-CM | POA: Diagnosis not present

## 2013-11-04 DIAGNOSIS — C50919 Malignant neoplasm of unspecified site of unspecified female breast: Secondary | ICD-10-CM | POA: Diagnosis not present

## 2013-11-05 DIAGNOSIS — C50919 Malignant neoplasm of unspecified site of unspecified female breast: Secondary | ICD-10-CM | POA: Diagnosis not present

## 2013-11-05 DIAGNOSIS — Z510 Encounter for antineoplastic radiation therapy: Secondary | ICD-10-CM | POA: Diagnosis not present

## 2013-11-06 DIAGNOSIS — Z510 Encounter for antineoplastic radiation therapy: Secondary | ICD-10-CM | POA: Diagnosis not present

## 2013-11-06 DIAGNOSIS — C50919 Malignant neoplasm of unspecified site of unspecified female breast: Secondary | ICD-10-CM | POA: Diagnosis not present

## 2013-11-07 DIAGNOSIS — C50919 Malignant neoplasm of unspecified site of unspecified female breast: Secondary | ICD-10-CM | POA: Diagnosis not present

## 2013-11-07 DIAGNOSIS — Z510 Encounter for antineoplastic radiation therapy: Secondary | ICD-10-CM | POA: Diagnosis not present

## 2013-11-08 DIAGNOSIS — C50919 Malignant neoplasm of unspecified site of unspecified female breast: Secondary | ICD-10-CM | POA: Diagnosis not present

## 2013-11-08 DIAGNOSIS — Z510 Encounter for antineoplastic radiation therapy: Secondary | ICD-10-CM | POA: Diagnosis not present

## 2013-11-11 DIAGNOSIS — Z510 Encounter for antineoplastic radiation therapy: Secondary | ICD-10-CM | POA: Diagnosis not present

## 2013-11-11 DIAGNOSIS — C50919 Malignant neoplasm of unspecified site of unspecified female breast: Secondary | ICD-10-CM | POA: Diagnosis not present

## 2013-11-12 DIAGNOSIS — C50919 Malignant neoplasm of unspecified site of unspecified female breast: Secondary | ICD-10-CM | POA: Diagnosis not present

## 2013-11-12 DIAGNOSIS — Z510 Encounter for antineoplastic radiation therapy: Secondary | ICD-10-CM | POA: Diagnosis not present

## 2013-11-13 DIAGNOSIS — Z510 Encounter for antineoplastic radiation therapy: Secondary | ICD-10-CM | POA: Diagnosis not present

## 2013-11-13 DIAGNOSIS — C50919 Malignant neoplasm of unspecified site of unspecified female breast: Secondary | ICD-10-CM | POA: Diagnosis not present

## 2013-11-14 DIAGNOSIS — C50919 Malignant neoplasm of unspecified site of unspecified female breast: Secondary | ICD-10-CM | POA: Diagnosis not present

## 2013-11-14 DIAGNOSIS — Z510 Encounter for antineoplastic radiation therapy: Secondary | ICD-10-CM | POA: Diagnosis not present

## 2013-11-15 DIAGNOSIS — Z510 Encounter for antineoplastic radiation therapy: Secondary | ICD-10-CM | POA: Diagnosis not present

## 2013-11-15 DIAGNOSIS — C50919 Malignant neoplasm of unspecified site of unspecified female breast: Secondary | ICD-10-CM | POA: Diagnosis not present

## 2013-11-18 DIAGNOSIS — C50919 Malignant neoplasm of unspecified site of unspecified female breast: Secondary | ICD-10-CM | POA: Diagnosis not present

## 2013-11-18 DIAGNOSIS — Z510 Encounter for antineoplastic radiation therapy: Secondary | ICD-10-CM | POA: Diagnosis not present

## 2013-11-19 DIAGNOSIS — Z510 Encounter for antineoplastic radiation therapy: Secondary | ICD-10-CM | POA: Diagnosis not present

## 2013-11-19 DIAGNOSIS — C50919 Malignant neoplasm of unspecified site of unspecified female breast: Secondary | ICD-10-CM | POA: Diagnosis not present

## 2013-11-20 DIAGNOSIS — C50919 Malignant neoplasm of unspecified site of unspecified female breast: Secondary | ICD-10-CM | POA: Diagnosis not present

## 2013-11-20 DIAGNOSIS — Z510 Encounter for antineoplastic radiation therapy: Secondary | ICD-10-CM | POA: Diagnosis not present

## 2013-11-21 ENCOUNTER — Ambulatory Visit (INDEPENDENT_AMBULATORY_CARE_PROVIDER_SITE_OTHER): Payer: BC Managed Care – PPO | Admitting: Obstetrics and Gynecology

## 2013-11-21 ENCOUNTER — Other Ambulatory Visit (HOSPITAL_COMMUNITY)
Admission: RE | Admit: 2013-11-21 | Discharge: 2013-11-21 | Disposition: A | Discharge status: Home / Self Care | Payer: BC Managed Care – PPO | Source: Ambulatory Visit | Attending: Obstetrics and Gynecology | Admitting: Obstetrics and Gynecology

## 2013-11-21 ENCOUNTER — Encounter: Payer: Self-pay | Admitting: Obstetrics and Gynecology

## 2013-11-21 ENCOUNTER — Encounter (INDEPENDENT_AMBULATORY_CARE_PROVIDER_SITE_OTHER): Payer: Self-pay

## 2013-11-21 VITALS — BP 140/70 | Ht 67.0 in | Wt 162.8 lb

## 2013-11-21 DIAGNOSIS — C50919 Malignant neoplasm of unspecified site of unspecified female breast: Secondary | ICD-10-CM | POA: Diagnosis not present

## 2013-11-21 DIAGNOSIS — Z01419 Encounter for gynecological examination (general) (routine) without abnormal findings: Secondary | ICD-10-CM

## 2013-11-21 DIAGNOSIS — Z1212 Encounter for screening for malignant neoplasm of rectum: Secondary | ICD-10-CM

## 2013-11-21 DIAGNOSIS — Z1151 Encounter for screening for human papillomavirus (HPV): Secondary | ICD-10-CM | POA: Insufficient documentation

## 2013-11-21 DIAGNOSIS — Z510 Encounter for antineoplastic radiation therapy: Secondary | ICD-10-CM | POA: Diagnosis not present

## 2013-11-21 DIAGNOSIS — Z853 Personal history of malignant neoplasm of breast: Secondary | ICD-10-CM | POA: Diagnosis not present

## 2013-11-21 NOTE — Progress Notes (Signed)
This chart was scribed by Ludger Nutting, Medical Scribe, for Dr. Mallory Shirk on 11/21/13 at 2:20 PM. This chart was reviewed by Dr. Mallory Shirk and is accurate.  Assessment:  Annual Gyn Exam   Plan:  1. S/p breast cancer dx rt breast 08/2013 by local excision with Neg lymph nodes. now in Rad Tx 2. pap smear done, next pap due in 3 years 3. return annually or prn 3    Annual mammogram advised Subjective:  [name] is a 53 y.o. female No obstetric history on file. who presents for annual exam. Patient's last menstrual period was 08/27/2013. The patient has no complaints today. Pt was recently diagnosed with breast cancer after noticing a lump at home in December. She has completed 11 days of radiation. She denies lymph node involvement. She reports local incision to right breast.   The following portions of the patient's history were reviewed and updated as appropriate: allergies, current medications, past family history, past medical history, past social history, past surgical history and problem list.  Review of Systems Constitutional: negative Gastrointestinal: negative Genitourinary: negative  Objective:  BP 140/70  Ht 5\' 7"  (1.702 m)  Wt 162 lb 12.8 oz (73.846 kg)  BMI 25.49 kg/m2  LMP 08/27/2013   BMI: Body mass index is 25.49 kg/(m^2).  General Appearance: Alert, appropriate appearance for age. No acute distress HEENT: Grossly normal Neck / Thyroid:  Cardiovascular: RRR; normal S1, S2, no murmur Lungs: CTA bilaterally Back: No CVAT Breast Exam: Bilateral symmetric firmness in the upper outer quadrants of breast tissue. No retraction or dimpling. Right breast: Diffuse redness from radiation  Left breast: old surgical scar from biopsy 10 years ago. Has had recent mammogram which was negative.  Gastrointestinal: Soft, non-tender, no masses or organomegaly Pelvic Exam: External genitalia: normal general appearance Vaginal: normal mucosa without prolapse or lesions and normal  without tenderness, induration or masses Cervix: normal appearance and atrophic, well supported Adnexa: normal bimanual exam Uterus: normal single, nontender, enlarged and 6-8 week size, deep in pelvis Rectovaginal: normal rectal, no masses and guaiac negative stool obtained Lymphatic Exam: Non-palpable nodes in neck, clavicular, axillary, or inguinal regions  Skin: no rash or abnormalities  Neurologic: Normal gait and speech, no tremor  Psychiatric: Alert and oriented, appropriate affect.   Urinalysis:Not done  Mallory Shirk. MD Pgr [phone] 2:20 PM

## 2013-11-22 DIAGNOSIS — Z510 Encounter for antineoplastic radiation therapy: Secondary | ICD-10-CM | POA: Diagnosis not present

## 2013-11-22 DIAGNOSIS — C50919 Malignant neoplasm of unspecified site of unspecified female breast: Secondary | ICD-10-CM | POA: Diagnosis not present

## 2013-11-25 DIAGNOSIS — C50919 Malignant neoplasm of unspecified site of unspecified female breast: Secondary | ICD-10-CM | POA: Diagnosis not present

## 2013-11-25 DIAGNOSIS — Z510 Encounter for antineoplastic radiation therapy: Secondary | ICD-10-CM | POA: Diagnosis not present

## 2013-11-26 DIAGNOSIS — Z510 Encounter for antineoplastic radiation therapy: Secondary | ICD-10-CM | POA: Diagnosis not present

## 2013-11-26 DIAGNOSIS — C50919 Malignant neoplasm of unspecified site of unspecified female breast: Secondary | ICD-10-CM | POA: Diagnosis not present

## 2013-11-27 DIAGNOSIS — C50919 Malignant neoplasm of unspecified site of unspecified female breast: Secondary | ICD-10-CM | POA: Diagnosis not present

## 2013-11-27 DIAGNOSIS — Z510 Encounter for antineoplastic radiation therapy: Secondary | ICD-10-CM | POA: Diagnosis not present

## 2013-11-28 DIAGNOSIS — C50919 Malignant neoplasm of unspecified site of unspecified female breast: Secondary | ICD-10-CM | POA: Diagnosis not present

## 2013-11-28 DIAGNOSIS — Z510 Encounter for antineoplastic radiation therapy: Secondary | ICD-10-CM | POA: Diagnosis not present

## 2013-11-29 ENCOUNTER — Telehealth: Payer: Self-pay | Admitting: Obstetrics and Gynecology

## 2013-11-29 DIAGNOSIS — C50919 Malignant neoplasm of unspecified site of unspecified female breast: Secondary | ICD-10-CM | POA: Diagnosis not present

## 2013-11-29 DIAGNOSIS — Z510 Encounter for antineoplastic radiation therapy: Secondary | ICD-10-CM | POA: Diagnosis not present

## 2013-11-29 NOTE — Telephone Encounter (Signed)
Pt aware of results 

## 2013-12-02 DIAGNOSIS — Z510 Encounter for antineoplastic radiation therapy: Secondary | ICD-10-CM | POA: Diagnosis not present

## 2013-12-02 DIAGNOSIS — C50919 Malignant neoplasm of unspecified site of unspecified female breast: Secondary | ICD-10-CM | POA: Diagnosis not present

## 2013-12-03 DIAGNOSIS — C50919 Malignant neoplasm of unspecified site of unspecified female breast: Secondary | ICD-10-CM | POA: Diagnosis not present

## 2013-12-03 DIAGNOSIS — Z510 Encounter for antineoplastic radiation therapy: Secondary | ICD-10-CM | POA: Diagnosis not present

## 2013-12-04 DIAGNOSIS — Z510 Encounter for antineoplastic radiation therapy: Secondary | ICD-10-CM | POA: Diagnosis not present

## 2013-12-04 DIAGNOSIS — C50919 Malignant neoplasm of unspecified site of unspecified female breast: Secondary | ICD-10-CM | POA: Diagnosis not present

## 2013-12-05 DIAGNOSIS — Z510 Encounter for antineoplastic radiation therapy: Secondary | ICD-10-CM | POA: Diagnosis not present

## 2013-12-05 DIAGNOSIS — C50919 Malignant neoplasm of unspecified site of unspecified female breast: Secondary | ICD-10-CM | POA: Diagnosis not present

## 2013-12-09 DIAGNOSIS — Z510 Encounter for antineoplastic radiation therapy: Secondary | ICD-10-CM | POA: Diagnosis not present

## 2013-12-09 DIAGNOSIS — C50919 Malignant neoplasm of unspecified site of unspecified female breast: Secondary | ICD-10-CM | POA: Diagnosis not present

## 2013-12-10 DIAGNOSIS — C50919 Malignant neoplasm of unspecified site of unspecified female breast: Secondary | ICD-10-CM | POA: Diagnosis not present

## 2013-12-10 DIAGNOSIS — Z510 Encounter for antineoplastic radiation therapy: Secondary | ICD-10-CM | POA: Diagnosis not present

## 2013-12-11 DIAGNOSIS — Z510 Encounter for antineoplastic radiation therapy: Secondary | ICD-10-CM | POA: Diagnosis not present

## 2013-12-11 DIAGNOSIS — C50919 Malignant neoplasm of unspecified site of unspecified female breast: Secondary | ICD-10-CM | POA: Diagnosis not present

## 2013-12-12 DIAGNOSIS — Z510 Encounter for antineoplastic radiation therapy: Secondary | ICD-10-CM | POA: Diagnosis not present

## 2013-12-12 DIAGNOSIS — C50919 Malignant neoplasm of unspecified site of unspecified female breast: Secondary | ICD-10-CM | POA: Diagnosis not present

## 2013-12-13 DIAGNOSIS — Z510 Encounter for antineoplastic radiation therapy: Secondary | ICD-10-CM | POA: Diagnosis not present

## 2013-12-13 DIAGNOSIS — C50919 Malignant neoplasm of unspecified site of unspecified female breast: Secondary | ICD-10-CM | POA: Diagnosis not present

## 2013-12-16 DIAGNOSIS — C50919 Malignant neoplasm of unspecified site of unspecified female breast: Secondary | ICD-10-CM | POA: Diagnosis not present

## 2013-12-16 DIAGNOSIS — Z510 Encounter for antineoplastic radiation therapy: Secondary | ICD-10-CM | POA: Diagnosis not present

## 2013-12-17 DIAGNOSIS — C50919 Malignant neoplasm of unspecified site of unspecified female breast: Secondary | ICD-10-CM | POA: Diagnosis not present

## 2013-12-17 DIAGNOSIS — Z510 Encounter for antineoplastic radiation therapy: Secondary | ICD-10-CM | POA: Diagnosis not present

## 2013-12-18 DIAGNOSIS — C50919 Malignant neoplasm of unspecified site of unspecified female breast: Secondary | ICD-10-CM | POA: Diagnosis not present

## 2013-12-18 DIAGNOSIS — Z510 Encounter for antineoplastic radiation therapy: Secondary | ICD-10-CM | POA: Diagnosis not present

## 2013-12-19 DIAGNOSIS — C50919 Malignant neoplasm of unspecified site of unspecified female breast: Secondary | ICD-10-CM | POA: Diagnosis not present

## 2013-12-19 DIAGNOSIS — Z510 Encounter for antineoplastic radiation therapy: Secondary | ICD-10-CM | POA: Diagnosis not present

## 2013-12-20 DIAGNOSIS — Z510 Encounter for antineoplastic radiation therapy: Secondary | ICD-10-CM | POA: Diagnosis not present

## 2013-12-20 DIAGNOSIS — C50919 Malignant neoplasm of unspecified site of unspecified female breast: Secondary | ICD-10-CM | POA: Diagnosis not present

## 2013-12-23 DIAGNOSIS — Z510 Encounter for antineoplastic radiation therapy: Secondary | ICD-10-CM | POA: Diagnosis not present

## 2013-12-23 DIAGNOSIS — F411 Generalized anxiety disorder: Secondary | ICD-10-CM | POA: Diagnosis not present

## 2013-12-23 DIAGNOSIS — IMO0002 Reserved for concepts with insufficient information to code with codable children: Secondary | ICD-10-CM | POA: Diagnosis not present

## 2013-12-23 DIAGNOSIS — C50919 Malignant neoplasm of unspecified site of unspecified female breast: Secondary | ICD-10-CM | POA: Diagnosis not present

## 2013-12-23 DIAGNOSIS — G8929 Other chronic pain: Secondary | ICD-10-CM | POA: Diagnosis not present

## 2013-12-24 DIAGNOSIS — C50919 Malignant neoplasm of unspecified site of unspecified female breast: Secondary | ICD-10-CM | POA: Diagnosis not present

## 2013-12-24 DIAGNOSIS — Z510 Encounter for antineoplastic radiation therapy: Secondary | ICD-10-CM | POA: Diagnosis not present

## 2013-12-26 ENCOUNTER — Other Ambulatory Visit (HOSPITAL_COMMUNITY): Payer: Self-pay | Admitting: Oncology

## 2013-12-26 ENCOUNTER — Other Ambulatory Visit (HOSPITAL_COMMUNITY): Payer: Self-pay | Admitting: Internal Medicine

## 2013-12-26 DIAGNOSIS — Z13820 Encounter for screening for osteoporosis: Secondary | ICD-10-CM

## 2014-01-01 ENCOUNTER — Ambulatory Visit (HOSPITAL_COMMUNITY)
Admission: RE | Admit: 2014-01-01 | Discharge: 2014-01-01 | Disposition: A | Discharge status: Home / Self Care | Payer: BC Managed Care – PPO | Source: Ambulatory Visit | Attending: Internal Medicine | Admitting: Internal Medicine

## 2014-01-01 DIAGNOSIS — C50919 Malignant neoplasm of unspecified site of unspecified female breast: Secondary | ICD-10-CM | POA: Insufficient documentation

## 2014-01-01 DIAGNOSIS — Z13820 Encounter for screening for osteoporosis: Secondary | ICD-10-CM

## 2014-01-01 DIAGNOSIS — Z780 Asymptomatic menopausal state: Secondary | ICD-10-CM | POA: Insufficient documentation

## 2014-02-10 DIAGNOSIS — R059 Cough, unspecified: Secondary | ICD-10-CM | POA: Diagnosis not present

## 2014-02-10 DIAGNOSIS — IMO0002 Reserved for concepts with insufficient information to code with codable children: Secondary | ICD-10-CM | POA: Diagnosis not present

## 2014-02-10 DIAGNOSIS — R05 Cough: Secondary | ICD-10-CM | POA: Diagnosis not present

## 2014-02-10 DIAGNOSIS — J029 Acute pharyngitis, unspecified: Secondary | ICD-10-CM | POA: Diagnosis not present

## 2014-03-24 DIAGNOSIS — G8929 Other chronic pain: Secondary | ICD-10-CM | POA: Diagnosis not present

## 2014-03-24 DIAGNOSIS — IMO0002 Reserved for concepts with insufficient information to code with codable children: Secondary | ICD-10-CM | POA: Diagnosis not present

## 2014-03-24 DIAGNOSIS — F411 Generalized anxiety disorder: Secondary | ICD-10-CM | POA: Diagnosis not present

## 2014-03-24 DIAGNOSIS — I10 Essential (primary) hypertension: Secondary | ICD-10-CM | POA: Diagnosis not present

## 2014-05-15 ENCOUNTER — Other Ambulatory Visit (HOSPITAL_COMMUNITY): Payer: Self-pay | Admitting: Family Medicine

## 2014-05-15 ENCOUNTER — Ambulatory Visit (HOSPITAL_COMMUNITY)
Admission: RE | Admit: 2014-05-15 | Discharge: 2014-05-15 | Disposition: A | Discharge status: Home / Self Care | Payer: BC Managed Care – PPO | Source: Ambulatory Visit | Attending: Family Medicine | Admitting: Family Medicine

## 2014-05-15 DIAGNOSIS — M549 Dorsalgia, unspecified: Secondary | ICD-10-CM | POA: Diagnosis not present

## 2014-05-15 DIAGNOSIS — IMO0002 Reserved for concepts with insufficient information to code with codable children: Secondary | ICD-10-CM | POA: Diagnosis not present

## 2014-05-15 DIAGNOSIS — R109 Unspecified abdominal pain: Secondary | ICD-10-CM | POA: Diagnosis not present

## 2014-05-15 DIAGNOSIS — M545 Low back pain, unspecified: Secondary | ICD-10-CM | POA: Diagnosis not present

## 2014-05-15 DIAGNOSIS — K5900 Constipation, unspecified: Secondary | ICD-10-CM | POA: Insufficient documentation

## 2014-05-16 ENCOUNTER — Encounter (HOSPITAL_COMMUNITY): Payer: Self-pay | Admitting: Emergency Medicine

## 2014-05-16 ENCOUNTER — Emergency Department (HOSPITAL_COMMUNITY)
Admission: EM | Admit: 2014-05-16 | Discharge: 2014-05-16 | Disposition: A | Discharge status: Home / Self Care | Payer: BC Managed Care – PPO | Attending: Emergency Medicine | Admitting: Emergency Medicine

## 2014-05-16 DIAGNOSIS — Z789 Other specified health status: Secondary | ICD-10-CM | POA: Insufficient documentation

## 2014-05-16 DIAGNOSIS — Z8679 Personal history of other diseases of the circulatory system: Secondary | ICD-10-CM | POA: Insufficient documentation

## 2014-05-16 DIAGNOSIS — K5900 Constipation, unspecified: Secondary | ICD-10-CM | POA: Diagnosis not present

## 2014-05-16 DIAGNOSIS — Z79899 Other long term (current) drug therapy: Secondary | ICD-10-CM | POA: Diagnosis not present

## 2014-05-16 DIAGNOSIS — Z862 Personal history of diseases of the blood and blood-forming organs and certain disorders involving the immune mechanism: Secondary | ICD-10-CM | POA: Insufficient documentation

## 2014-05-16 DIAGNOSIS — M129 Arthropathy, unspecified: Secondary | ICD-10-CM | POA: Diagnosis not present

## 2014-05-16 DIAGNOSIS — Z853 Personal history of malignant neoplasm of breast: Secondary | ICD-10-CM | POA: Insufficient documentation

## 2014-05-16 DIAGNOSIS — M545 Low back pain, unspecified: Secondary | ICD-10-CM | POA: Diagnosis present

## 2014-05-16 DIAGNOSIS — Z87828 Personal history of other (healed) physical injury and trauma: Secondary | ICD-10-CM | POA: Insufficient documentation

## 2014-05-16 DIAGNOSIS — Z9889 Other specified postprocedural states: Secondary | ICD-10-CM | POA: Diagnosis not present

## 2014-05-16 DIAGNOSIS — K5909 Other constipation: Secondary | ICD-10-CM

## 2014-05-16 DIAGNOSIS — Z9851 Tubal ligation status: Secondary | ICD-10-CM | POA: Insufficient documentation

## 2014-05-16 DIAGNOSIS — G8929 Other chronic pain: Secondary | ICD-10-CM | POA: Insufficient documentation

## 2014-05-16 MED ORDER — HYDROMORPHONE HCL PF 1 MG/ML IJ SOLN
1.0000 mg | Freq: Once | INTRAMUSCULAR | Status: AC
  Administered 2014-05-16: 1 mg via INTRAMUSCULAR
  Filled 2014-05-16: qty 1

## 2014-05-16 MED ORDER — ONDANSETRON 8 MG PO TBDP
8.0000 mg | ORAL_TABLET | Freq: Once | ORAL | Status: AC
  Administered 2014-05-16: 8 mg via ORAL
  Filled 2014-05-16: qty 1

## 2014-05-16 MED ORDER — OXYCODONE-ACETAMINOPHEN 5-325 MG PO TABS: 1.0000 | ORAL_TABLET | ORAL | Status: DC | PRN

## 2014-05-16 MED ORDER — POLYETHYLENE GLYCOL 3350 17 GM/SCOOP PO POWD: ORAL | Status: DC

## 2014-05-16 NOTE — ED Notes (Signed)
Patient with no complaints at this time. Respirations even and unlabored. Skin warm/dry. Discharge instructions reviewed with patient at this time. Patient given opportunity to voice concerns/ask questions. Patient discharged at this time and left Emergency Department with steady gait.   

## 2014-05-16 NOTE — ED Notes (Signed)
Pt states she has chronic low back pain, this episode started 1 week ago, pt seen by PCP, states she has no relief from pain.

## 2014-05-16 NOTE — ED Provider Notes (Signed)
CSN: [id number]     Arrival date & time 05/16/14  0803 History   First MD Initiated Contact with Patient 05/16/14 [phone]     Chief Complaint  Patient presents with  . Back Pain     (Consider location/radiation/quality/duration/timing/severity/associated sxs/prior Treatment) The history is provided by the patient and the spouse.   [name] is a 53 y.o. female presenting with acute on chronic low back pain.  She reports having a history of a work related injury requiring multiple back and c spine surgeries with hardware and rod placement. Last week she developed a flare up of her pain which is worsening despite evaluation by another provider in her PCPs office yesterday and a change in her medications from Flexeril to Robaxin.  She continues to use her hydrocodone 10/325 mg strength tablets which she has been taking chronically for years.  She describes constant aching low back pain which radiates around bilaterally to her lower pelvis, although this symptom has improved since she was given laxative samples in her doctor's office with having 3 bowel movements yesterday.  Pain is worse with movement and better at rest.  She was unable to bed this morning prompted her visit here this morning.  She denies radiation of pain into her legs and denies numbness or weakness in her legs, no urinary or bowel incontinence or retention.  She denies fevers or chills.  She does have a history of breast cancer having a partial mastectomy 6 month ago.   Past Medical History  Diagnosis Date  . Chronic neck pain   . Chronic back pain   . Anemia   . Arthritis   . Wears glasses   . Dysrhythmia     hx palpitations-too much caffiene  . Cancer     breast   Past Surgical History  Procedure Laterality Date  . Cesarean section      x2  . Breast surgery      left partail mastectomy-cyst  . Ankle surgery      left ankle cyst  . Cervical disc surgery  1999    anterior and a posterier cerv fusionx2  . Tubal  ligation      with last c-section  . Dilitation & currettage/hystroscopy with thermachoice ablation  07/10/2012    Procedure: [place];  Surgeon: [name], MD;  Location: [facility];  Service: Gynecology;  Laterality: N/A;  D5 72ml in, 13 ml out, temp 87degree celcius, total therapy time 58min 16sec  . Back surgery  2000    lumbar disc surgery  . Dilation and curettage of uterus    . Colonoscopy    . Ercp     Family History  Problem Relation Age of Onset  . Hypertension Mother    History  Substance Use Topics  . Smoking status: Never Smoker   . Smokeless tobacco: Never Used  . Alcohol Use: No   OB History   Grav Para Term Preterm Abortions TAB SAB Ect Mult Living                 Review of Systems  Constitutional: Negative for fever.  Respiratory: Negative for shortness of breath.   Cardiovascular: Negative for chest pain and leg swelling.  Gastrointestinal: Negative for abdominal pain, constipation and abdominal distention.  Genitourinary: Negative for dysuria, urgency, frequency, flank pain and difficulty urinating.  Musculoskeletal: Positive for back pain. Negative for gait problem and joint swelling.  Skin: Negative for rash.  Neurological:  Negative for weakness and numbness.      Allergies  Review of patient's allergies indicates no known allergies.  Home Medications   Prior to Admission medications   Medication Sig Start Date End Date Taking? Authorizing Provider  ALPRAZolam Duanne Moron) 0.5 MG tablet Take 0.5 mg by mouth 3 (three) times daily as needed. For anxiety   Yes Historical Provider, MD  amitriptyline (ELAVIL) 25 MG tablet Take 50 mg by mouth at bedtime.   Yes Historical Provider, MD  Calcium Carbonate (CALCIUM 600 PO) Take 600 mg by mouth daily.   Yes Historical Provider, MD  cholecalciferol (VITAMIN D) 1000 UNITS tablet Take 1,000 Units by mouth daily.   Yes Historical Provider, MD  cyclobenzaprine  (FLEXERIL) 10 MG tablet Take 10 mg by mouth 3 (three) times daily as needed. For muscle spasms   Yes Historical Provider, MD  HYDROcodone-acetaminophen (NORCO) 10-325 MG per tablet Take 1 tablet by mouth every 6 (six) hours as needed (pain).   Yes Historical Provider, MD  Multiple Vitamins-Minerals (CENTRUM SILVER ADULT 50+) TABS Take 1 tablet by mouth daily.   Yes Historical Provider, MD  tamoxifen (NOLVADEX) 20 MG tablet Take 20 mg by mouth daily.   Yes Historical Provider, MD  oxyCODONE-acetaminophen (PERCOCET/ROXICET) 5-325 MG per tablet Take 1 tablet by mouth every 4 (four) hours as needed. 05/16/14   Evalee Jefferson, PA-C  polyethylene glycol powder (GLYCOLAX/MIRALAX) powder Take 1 dose daily per label instructions prn constipation. 05/16/14   Evalee Jefferson, PA-C   BP 128/75  Pulse 87  Temp(Src) 97.8 F (36.6 C) (Oral)  Resp 16  SpO2 98%  LMP 12/06/2013 Physical Exam  Nursing note and vitals reviewed. Constitutional: She appears well-developed and well-nourished.  HENT:  Head: Normocephalic.  Eyes: Conjunctivae are normal.  Neck: Normal range of motion. Neck supple.  Cardiovascular: Normal rate and intact distal pulses.   Pedal pulses normal.  Pulmonary/Chest: Effort normal.  Abdominal: Soft. Bowel sounds are normal. She exhibits no distension and no mass.  Musculoskeletal: Normal range of motion. She exhibits no edema.       Lumbar back: She exhibits tenderness. She exhibits no swelling, no edema, no deformity and no spasm.  Bilateral paralumbar tenderness to palpation.  Neurological: She is alert. She has normal strength. She displays no atrophy and no tremor. No sensory deficit. Gait normal.  Reflex Scores:      Patellar reflexes are 2+ on the right side and 2+ on the left side.      Achilles reflexes are 2+ on the right side and 2+ on the left side. No strength deficit noted in hip and knee flexor and extensor muscle groups.  Ankle flexion and extension intact.  Skin: Skin is warm  and dry.  Psychiatric: She has a normal mood and affect.    ED Course  Procedures (including critical care time) Labs Review Labs Reviewed - No data to display  Imaging Review Dg Abd Acute W/chest  05/15/2014   CLINICAL DATA:  Low back pain in abdominal pain 1 week.  EXAM: ACUTE ABDOMEN SERIES (ABDOMEN 2 VIEW & CHEST 1 VIEW)  COMPARISON:  Chest x-ray 01/04/2012 and lumbar spine films 02/14/2011  FINDINGS: Lungs are adequately inflated without consolidation or effusion. Cardiomediastinal silhouette and remainder of the chest is unchanged.  Abdominal films demonstrate a nonobstructive bowel gas pattern with mild fecal retention over the ascending and descending colon. There are mild degenerative changes of the spine. Remainder of the exam is unchanged.  IMPRESSION: Mild fecal retention  over the a ascending and descending colon, otherwise no evidence of bowel obstruction.  No acute cardiopulmonary disease.   Electronically Signed   By: Marin Olp M.D.   On: 05/15/2014 09:27     EKG Interpretation None      MDM   Final diagnoses:  Bilateral low back pain without sciatica  Other constipation     patient was discussed with Dr. Rogene Houston during this visit.   Patient had an acute abdominal series completed through her PCPs office yesterday.  These films were reviewed showing moderate constipation, lumbar spine unremarkable, and no lesions, no concerns for metastases.  Patient was given Dilaudid IM injections 1 mg x2 with improvement in pain prior to discharge home.  She was given oxycodone to try in place of her hydrocodone for the next several days.  She was also prescribed MiraLax which she may want to try to avoid further constipation and she states she has chronic constipation problems, suspect this is narcotic-induced.  Advise followup with her PCP for recheck if her symptoms are not improving.  Activity as tolerated, heat therapy.    No neuro deficit on exam or by history to suggest  emergent or surgical presentation.  Also discussed worsened sx that should prompt immediate re-evaluation including distal weakness, bowel/bladder retention/incontinence.          Evalee Jefferson, PA-C 05/16/14 [place], PA-C 05/16/14 1821

## 2014-05-16 NOTE — Discharge Instructions (Signed)
Back Pain, Adult Back pain is very common. The pain often gets better over time. The cause of back pain is usually not dangerous. Most people can learn to manage their back pain on their own.  HOME CARE   Stay active. Start with short walks on flat ground if you can. Try to walk farther each day.  Do not sit, drive, or stand in one place for more than 30 minutes. Do not stay in bed.  Do not avoid exercise or work. Activity can help your back heal faster.  Be careful when you bend or lift an object. Bend at your knees, keep the object close to you, and do not twist.  Sleep on a firm mattress. Lie on your side, and bend your knees. If you lie on your back, put a pillow under your knees.  Only take medicines as told by your doctor.  Put ice on the injured area.  Put ice in a plastic bag.  Place a towel between your skin and the bag.  Leave the ice on for 15-20 minutes, 03-04 times a day for the first 2 to 3 days. After that, you can switch between ice and heat packs.  Ask your doctor about back exercises or massage.  Avoid feeling anxious or stressed. Find good ways to deal with stress, such as exercise. GET HELP RIGHT AWAY IF:   Your pain does not go away with rest or medicine.  Your pain does not go away in 1 week.  You have new problems.  You do not feel well.  The pain spreads into your legs.  You cannot control when you poop (bowel movement) or pee (urinate).  Your arms or legs feel weak or lose feeling (numbness).  You feel sick to your stomach (nauseous) or throw up (vomit).  You have belly (abdominal) pain.  You feel like you may pass out (faint). MAKE SURE YOU:   Understand these instructions.  Will watch your condition.  Will get help right away if you are not doing well or get worse. Document Released: 02/08/2008 Document Revised: 11/14/2011 Document Reviewed: 12/24/2013 [facility] Patient Information 2015 [place], [place]. This information is not intended  to replace advice given to you by your health care provider. Make sure you discuss any questions you have with your health care provider.  Constipation Constipation is when a person has fewer than three bowel movements a week, has difficulty having a bowel movement, or has stools that are dry, hard, or larger than normal. As people grow older, constipation is more common. If you try to fix constipation with medicines that make you have a bowel movement (laxatives), the problem may get worse. Long-term laxative use may cause the muscles of the colon to become weak. A low-fiber diet, not taking in enough fluids, and taking certain medicines may make constipation worse.  CAUSES   Certain medicines, such as antidepressants, pain medicine, iron supplements, antacids, and water pills.   Certain diseases, such as diabetes, irritable bowel syndrome (IBS), thyroid disease, or depression.   Not drinking enough water.   Not eating enough fiber-rich foods.   Stress or travel.   Lack of physical activity or exercise.   Ignoring the urge to have a bowel movement.   Using laxatives too much.  SIGNS AND SYMPTOMS   Having fewer than three bowel movements a week.   Straining to have a bowel movement.   Having stools that are hard, dry, or larger than normal.   Feeling full  or bloated.   Pain in the lower abdomen.   Not feeling relief after having a bowel movement.  DIAGNOSIS  Your health care provider will take a medical history and perform a physical exam. Further testing may be done for severe constipation. Some tests may include:  A barium enema X-ray to examine your rectum, colon, and, sometimes, your small intestine.   A sigmoidoscopy to examine your lower colon.   A colonoscopy to examine your entire colon. TREATMENT  Treatment will depend on the severity of your constipation and what is causing it. Some dietary treatments include drinking more fluids and eating more  fiber-rich foods. Lifestyle treatments may include regular exercise. If these diet and lifestyle recommendations do not help, your health care provider may recommend taking over-the-counter laxative medicines to help you have bowel movements. Prescription medicines may be prescribed if over-the-counter medicines do not work.  HOME CARE INSTRUCTIONS   Eat foods that have a lot of fiber, such as fruits, vegetables, whole grains, and beans.  Limit foods high in fat and processed sugars, such as french fries, hamburgers, cookies, candies, and soda.   A fiber supplement may be added to your diet if you cannot get enough fiber from foods.   Drink enough fluids to keep your urine clear or pale yellow.   Exercise regularly or as directed by your health care provider.   Go to the restroom when you have the urge to go. Do not hold it.   Only take over-the-counter or prescription medicines as directed by your health care provider. Do not take other medicines for constipation without talking to your health care provider first.  [place] IF:   You have bright red blood in your stool.   Your constipation lasts for more than 4 days or gets worse.   You have abdominal or rectal pain.   You have thin, pencil-like stools.   You have unexplained weight loss. MAKE SURE YOU:   Understand these instructions.  Will watch your condition.  Will get help right away if you are not doing well or get worse. Document Released: 05/20/2004 Document Revised: 08/27/2013 Document Reviewed: 06/03/2013 [facility] Patient Information 2015 [place], [place]. This information is not intended to replace advice given to you by your health care provider. Make sure you discuss any questions you have with your health care provider.   Take the oxycodone in place of your hydrocodone which may give better pain relief.  You are constipated based on your xray performed in your doctors office yesterday.   I suggest getting dulcolax to help you have a bowel movement, then adding miralax daily to help you stay more regular.  Use your pain medicine sparingly as unfortunately this can cause constipation.

## 2014-05-22 NOTE — ED Provider Notes (Signed)
Medical screening examination/treatment/procedure(s) were performed by non-physician practitioner and as supervising physician I was immediately available for consultation/collaboration.   EKG Interpretation None        Fredia Sorrow, MD 05/22/14 0201

## 2014-05-26 ENCOUNTER — Ambulatory Visit (INDEPENDENT_AMBULATORY_CARE_PROVIDER_SITE_OTHER): Payer: BC Managed Care – PPO | Admitting: General Surgery

## 2014-06-06 DIAGNOSIS — Z23 Encounter for immunization: Secondary | ICD-10-CM | POA: Diagnosis not present

## 2014-06-20 ENCOUNTER — Ambulatory Visit (HOSPITAL_COMMUNITY)
Admission: RE | Admit: 2014-06-20 | Discharge: 2014-06-20 | Disposition: A | Discharge status: Home / Self Care | Payer: BC Managed Care – PPO | Source: Ambulatory Visit | Attending: Diagnostic Radiology | Admitting: Diagnostic Radiology

## 2014-06-20 ENCOUNTER — Other Ambulatory Visit (HOSPITAL_COMMUNITY): Payer: Self-pay | Admitting: Internal Medicine

## 2014-06-20 DIAGNOSIS — S6990XA Unspecified injury of unspecified wrist, hand and finger(s), initial encounter: Secondary | ICD-10-CM

## 2014-06-20 DIAGNOSIS — M79644 Pain in right finger(s): Secondary | ICD-10-CM | POA: Diagnosis not present

## 2014-06-20 DIAGNOSIS — Z6823 Body mass index (BMI) 23.0-23.9, adult: Secondary | ICD-10-CM | POA: Diagnosis not present

## 2014-06-20 DIAGNOSIS — G894 Chronic pain syndrome: Secondary | ICD-10-CM | POA: Diagnosis not present

## 2014-07-21 DIAGNOSIS — Z853 Personal history of malignant neoplasm of breast: Secondary | ICD-10-CM | POA: Diagnosis not present

## 2014-07-21 DIAGNOSIS — Z08 Encounter for follow-up examination after completed treatment for malignant neoplasm: Secondary | ICD-10-CM | POA: Diagnosis not present

## 2014-07-28 DIAGNOSIS — R232 Flushing: Secondary | ICD-10-CM | POA: Insufficient documentation

## 2014-07-28 DIAGNOSIS — H539 Unspecified visual disturbance: Secondary | ICD-10-CM | POA: Insufficient documentation

## 2014-07-28 DIAGNOSIS — T451X5A Adverse effect of antineoplastic and immunosuppressive drugs, initial encounter: Secondary | ICD-10-CM | POA: Insufficient documentation

## 2014-07-28 DIAGNOSIS — R413 Other amnesia: Secondary | ICD-10-CM | POA: Insufficient documentation

## 2014-09-15 ENCOUNTER — Other Ambulatory Visit: Payer: Self-pay | Admitting: Obstetrics and Gynecology

## 2014-09-15 DIAGNOSIS — Z09 Encounter for follow-up examination after completed treatment for conditions other than malignant neoplasm: Secondary | ICD-10-CM

## 2014-09-23 DIAGNOSIS — F419 Anxiety disorder, unspecified: Secondary | ICD-10-CM | POA: Diagnosis not present

## 2014-09-23 DIAGNOSIS — N951 Menopausal and female climacteric states: Secondary | ICD-10-CM | POA: Diagnosis not present

## 2014-09-23 DIAGNOSIS — C50919 Malignant neoplasm of unspecified site of unspecified female breast: Secondary | ICD-10-CM | POA: Diagnosis not present

## 2014-09-23 DIAGNOSIS — Z23 Encounter for immunization: Secondary | ICD-10-CM | POA: Diagnosis not present

## 2014-09-23 DIAGNOSIS — G894 Chronic pain syndrome: Secondary | ICD-10-CM | POA: Diagnosis not present

## 2014-09-23 DIAGNOSIS — Z6823 Body mass index (BMI) 23.0-23.9, adult: Secondary | ICD-10-CM | POA: Diagnosis not present

## 2014-09-30 ENCOUNTER — Encounter (HOSPITAL_COMMUNITY): Payer: Medicare Other

## 2014-10-09 ENCOUNTER — Ambulatory Visit: Payer: Medicare Other | Admitting: Obstetrics and Gynecology

## 2014-10-09 ENCOUNTER — Ambulatory Visit (INDEPENDENT_AMBULATORY_CARE_PROVIDER_SITE_OTHER): Payer: BLUE CROSS/BLUE SHIELD | Admitting: Obstetrics and Gynecology

## 2014-10-09 ENCOUNTER — Encounter: Payer: Self-pay | Admitting: Obstetrics and Gynecology

## 2014-10-09 VITALS — BP 120/80 | Ht 67.0 in | Wt 162.0 lb

## 2014-10-09 DIAGNOSIS — N951 Menopausal and female climacteric states: Secondary | ICD-10-CM

## 2014-10-09 MED ORDER — PAROXETINE MESYLATE 7.5 MG PO CAPS: 1.0000 | ORAL_CAPSULE | Freq: Every day | ORAL | Status: DC

## 2014-10-09 NOTE — Progress Notes (Signed)
Patient ID: [name], female   DOB: 11-29-1960, 54 y.o.   MRN: [id number]   [facility] Visit  Patient name: [name] MRN [id number]  Date of birth: Jan 21, 1961 Scribed by [name], and reviewed and edited as needed  by me  CC & HPI:  [name] is a 54 y.o. female presenting today for constant hot flashes that occur every 30 minutes.  She states that immediately following a hot flash, she experiences a cold chill.  She has a history of breast cancer in 2014 and underwent 6 months of radiation.  She was previously taking Tamoxifen prescribed by Dr. Tama Sanchez but is no longer taking any cancer medication.  She is taking Elavil with no relief to her hot flashes.  She was advised to avoid taking medications with estrogen in them due to type of bReast cancer , er+, PR+.   ROS:  All systems have been reviewed and are negative unless otherwise indicated in the HPI.    Pertinent History Reviewed:   Reviewed: Significant for tubal ligation, endometrial ablation Medical         Past Medical History  Diagnosis Date  . Chronic neck pain   . Chronic back pain   . Anemia   . Arthritis   . Wears glasses   . Dysrhythmia     hx palpitations-too much caffiene  . Cancer     breast                              Surgical Hx:    Past Surgical History  Procedure Laterality Date  . Cesarean section      x2  . Breast surgery      left partail mastectomy-cyst  . Ankle surgery      left ankle cyst  . Cervical disc surgery  1999    anterior and a posterier cerv fusionx2  . Tubal ligation      with last c-section  . Dilitation & currettage/hystroscopy with thermachoice ablation  07/10/2012    Procedure: [place];  Surgeon: Caroline Kind, MD;  Location: [facility];  Service: Gynecology;  Laterality: N/A;  D5 57ml in, 13 ml out, temp 87degree celcius, total therapy time 4min 16sec  . Back surgery  2000    lumbar disc surgery  . Dilation and  curettage of uterus    . Colonoscopy    . Ercp     Medications: Reviewed & Updated - see associated section                       Current outpatient prescriptions:  .  ALPRAZolam (XANAX) 0.5 MG tablet, Take 0.5 mg by mouth 3 (three) times daily as needed. For anxiety, Disp: , Rfl:  .  amitriptyline (ELAVIL) 25 MG tablet, Take 50 mg by mouth at bedtime., Disp: , Rfl:  .  Calcium Carbonate (CALCIUM 600 PO), Take 600 mg by mouth daily., Disp: , Rfl:  .  cholecalciferol (VITAMIN D) 1000 UNITS tablet, Take 1,000 Units by mouth daily., Disp: , Rfl:  .  cyclobenzaprine (FLEXERIL) 10 MG tablet, Take 10 mg by mouth 3 (three) times daily as needed. For muscle spasms, Disp: , Rfl:  .  HYDROcodone-acetaminophen (NORCO) 10-325 MG per tablet, Take 1 tablet by mouth every 6 (six) hours as needed (pain)., Disp: , Rfl:  .  Multiple Vitamins-Minerals (CENTRUM SILVER ADULT 50+) TABS,  Take 1 tablet by mouth daily., Disp: , Rfl:    Social History: Reviewed -  reports that she has never smoked. She has never used smokeless tobacco.  Objective Findings:  Vitals: Blood pressure 120/80, height 5\' 7"  (1.702 m), weight 162 lb (73.483 kg), last menstrual period 12/06/2013.  Physical Examination: General appearance - alert, well appearing, and in no distress, oriented to person, place, and time, playful, active, well hydrated and anxious Mental status - alert, oriented to person, place, and time, normal mood, behavior, speech, dress, motor activity, and thought processes, affect appropriate to mood   Assessment & Plan:   A:  1. MENOPAUSE VASOMOTOR SX  P:  1. TRIAL bRISDELLE.  This chart was scribed for Caroline Kind, MD by [name], ED Scribe. The patient's care was started at 2:10 PM.

## 2014-10-09 NOTE — Progress Notes (Signed)
Patient ID: [name], female   DOB: September 17, 1960, 55 y.o.   MRN: [id number] Pt here today for hot flashes. Pt states that she has terrible hot flashes and then she gets chills. Night sweats.

## 2014-10-14 ENCOUNTER — Ambulatory Visit (HOSPITAL_COMMUNITY)
Admission: RE | Admit: 2014-10-14 | Discharge: 2014-10-14 | Disposition: A | Discharge status: Home / Self Care | Payer: BLUE CROSS/BLUE SHIELD | Source: Ambulatory Visit | Attending: Obstetrics and Gynecology | Admitting: Obstetrics and Gynecology

## 2014-10-14 DIAGNOSIS — Z09 Encounter for follow-up examination after completed treatment for conditions other than malignant neoplasm: Secondary | ICD-10-CM

## 2014-10-14 DIAGNOSIS — Z853 Personal history of malignant neoplasm of breast: Secondary | ICD-10-CM | POA: Diagnosis not present

## 2014-11-24 DIAGNOSIS — R232 Flushing: Secondary | ICD-10-CM | POA: Diagnosis not present

## 2014-11-24 DIAGNOSIS — C50219 Malignant neoplasm of upper-inner quadrant of unspecified female breast: Secondary | ICD-10-CM | POA: Diagnosis not present

## 2014-11-26 ENCOUNTER — Other Ambulatory Visit: Payer: Medicare Other | Admitting: Obstetrics and Gynecology

## 2014-12-17 ENCOUNTER — Encounter: Payer: Self-pay | Admitting: Obstetrics and Gynecology

## 2014-12-17 ENCOUNTER — Ambulatory Visit (INDEPENDENT_AMBULATORY_CARE_PROVIDER_SITE_OTHER): Payer: BLUE CROSS/BLUE SHIELD | Admitting: Obstetrics and Gynecology

## 2014-12-17 VITALS — BP 140/80 | Ht 67.0 in | Wt 166.0 lb

## 2014-12-17 DIAGNOSIS — Z0000 Encounter for general adult medical examination without abnormal findings: Secondary | ICD-10-CM

## 2014-12-17 DIAGNOSIS — Z853 Personal history of malignant neoplasm of breast: Secondary | ICD-10-CM | POA: Diagnosis not present

## 2014-12-17 NOTE — Progress Notes (Signed)
Patient ID: [name], female   DOB: 05-15-1961, 54 y.o.   MRN: [id number] Pt here today for annual exam. Pt denies any problems or concerns at this time.

## 2014-12-17 NOTE — Progress Notes (Signed)
Patient ID: [name], female   DOB: 1960/12/01, 55 y.o.   MRN: [id number]  This chart was SCRIBED for Mallory Shirk, MD by Stephania Fragmin, ED Scribe. This patient was seen in room 2 and the patient's care was started at 11:32 AM.   Assessment:  Annual Gyn Exam S.p Rt br cancer with neg f/u , on anastrozole 1 mg daily   Plan:  1. pap smear done in 2015, next pap due in 2018 (2 years) 2. return annual prn, or in 2 yr for pap 3    Annual mammogram advised Subjective:  [name] is a 54 y.o. female No obstetric history on file. who presents for annual exam. Patient's last menstrual period was 12/06/2013. The patient has no complaints.  Patient was last seen by me 10/09/14 for hot flashes, for which I prescribed paroxetine. However, she did not fill the prescription because her insurance did not cover it. She then took Vitamin B6, which completely eliminated her hot flashes.    She has a history of right-sided breast cancer. She has a history of 2 children, delivered by C-section.  The following portions of the patient's history were reviewed and updated as appropriate: allergies, current medications, past family history, past medical history, past social history, past surgical history and problem list. Past Medical History  Diagnosis Date  . Chronic neck pain   . Chronic back pain   . Anemia   . Arthritis   . Wears glasses   . Dysrhythmia     hx palpitations-too much caffiene  . Cancer     breast    Past Surgical History  Procedure Laterality Date  . Cesarean section      x2  . Breast surgery      left partail mastectomy-cyst  . Ankle surgery      left ankle cyst  . Cervical disc surgery  1999    anterior and a posterier cerv fusionx2  . Tubal ligation      with last c-section  . Dilitation & currettage/hystroscopy with thermachoice ablation  07/10/2012    Procedure: [facility];  Surgeon: [name], MD;  Location: [facility];   Service: Gynecology;  Laterality: N/A;  D5 45ml in, 13 ml out, temp 87degree celcius, total therapy time 63min 16sec  . Back surgery  2000    lumbar disc surgery  . Dilation and curettage of uterus    . Colonoscopy    . Ercp       Current outpatient prescriptions:  .  ALPRAZolam (XANAX) 0.5 MG tablet, Take 0.5 mg by mouth 3 (three) times daily as needed. For anxiety, Disp: , Rfl:  .  amitriptyline (ELAVIL) 25 MG tablet, Take 50 mg by mouth at bedtime., Disp: , Rfl:  .  anastrozole (ARIMIDEX) 1 MG tablet, Take 1 mg by mouth daily., Disp: , Rfl:  .  Calcium Carbonate (CALCIUM 600 PO), Take 600 mg by mouth daily., Disp: , Rfl:  .  cholecalciferol (VITAMIN D) 1000 UNITS tablet, Take 1,000 Units by mouth daily., Disp: , Rfl:  .  cyclobenzaprine (FLEXERIL) 10 MG tablet, Take 10 mg by mouth 3 (three) times daily as needed. For muscle spasms, Disp: , Rfl:  .  HYDROcodone-acetaminophen (NORCO) 10-325 MG per tablet, Take 1 tablet by mouth every 6 (six) hours as needed (pain)., Disp: , Rfl:  .  Multiple Vitamins-Minerals (CENTRUM SILVER ADULT 50+) TABS, Take 1 tablet by mouth daily., Disp: , Rfl:   Review  of Systems Constitutional: negative Gastrointestinal: negative Genitourinary: negative A complete 10 system review of systems was obtained and all systems are negative except as noted in the HPI and PMH.    Objective:  BP 140/80 mmHg  Ht 5\' 7"  (1.702 m)  Wt 166 lb (75.297 kg)  BMI 25.99 kg/m2  LMP 12/06/2013   BMI: Body mass index is 25.99 kg/(m^2).  General Appearance: Alert, appropriate appearance for age. No acute distress HEENT: Grossly normal Neck / Thyroid:  Cardiovascular: RRR; normal S1, S2, no murmur Lungs: CTA bilaterally Back: No CVAT Breast Exam: No dimpling, nipple retraction or discharge. No masses or nodes. and No masses or nodes.No dimpling, nipple retraction or discharge. Gastrointestinal: Soft, non-tender, no masses or organomegaly Pelvic Exam: Exam deferred. Vulva and  vagina appear normal. Bimanual exam reveals normal uterus and adnexa. External genitalia: normal general appearance Urinary system: urethral meatus normal Vaginal: normal mucosa without prolapse or lesions, normal without tenderness, induration or masses and normal rugae Cervix: normal appearance Adnexa: normal bimanual exam Uterus: 8 weeks size, well-supported, nontender Rectal: good sphincter tone and no masses  Rectovaginal: not indicated Lymphatic Exam: Non-palpable nodes in neck, clavicular, axillary, or inguinal regions  Skin: no rash or abnormalities Neurologic: Normal gait and speech, no tremor  Psychiatric: Alert and oriented, appropriate affect.  Urinalysis:Not done  Mallory Shirk. MD Pgr [phone] 11:31 AM    I personally performed the services described in this documentation, which was SCRIBED in my presence. The recorded information has been reviewed and considered accurate. It has been edited as necessary during review. [name], MD

## 2014-12-23 DIAGNOSIS — N951 Menopausal and female climacteric states: Secondary | ICD-10-CM | POA: Diagnosis not present

## 2014-12-23 DIAGNOSIS — Z6823 Body mass index (BMI) 23.0-23.9, adult: Secondary | ICD-10-CM | POA: Diagnosis not present

## 2014-12-23 DIAGNOSIS — G894 Chronic pain syndrome: Secondary | ICD-10-CM | POA: Diagnosis not present

## 2015-01-05 DIAGNOSIS — C50211 Malignant neoplasm of upper-inner quadrant of right female breast: Secondary | ICD-10-CM | POA: Diagnosis not present

## 2015-01-05 DIAGNOSIS — F329 Major depressive disorder, single episode, unspecified: Secondary | ICD-10-CM | POA: Diagnosis not present

## 2015-01-05 DIAGNOSIS — M791 Myalgia, unspecified site: Secondary | ICD-10-CM | POA: Insufficient documentation

## 2015-02-16 DIAGNOSIS — C50211 Malignant neoplasm of upper-inner quadrant of right female breast: Secondary | ICD-10-CM | POA: Diagnosis not present

## 2015-02-16 DIAGNOSIS — M791 Myalgia: Secondary | ICD-10-CM | POA: Diagnosis not present

## 2015-02-16 DIAGNOSIS — F329 Major depressive disorder, single episode, unspecified: Secondary | ICD-10-CM | POA: Diagnosis not present

## 2015-02-16 DIAGNOSIS — R232 Flushing: Secondary | ICD-10-CM | POA: Diagnosis not present

## 2015-02-16 DIAGNOSIS — D649 Anemia, unspecified: Secondary | ICD-10-CM | POA: Diagnosis not present

## 2015-02-25 DIAGNOSIS — Z6823 Body mass index (BMI) 23.0-23.9, adult: Secondary | ICD-10-CM | POA: Diagnosis not present

## 2015-02-25 DIAGNOSIS — N342 Other urethritis: Secondary | ICD-10-CM | POA: Diagnosis not present

## 2015-02-25 DIAGNOSIS — Z1389 Encounter for screening for other disorder: Secondary | ICD-10-CM | POA: Diagnosis not present

## 2015-03-20 DIAGNOSIS — Z6823 Body mass index (BMI) 23.0-23.9, adult: Secondary | ICD-10-CM | POA: Diagnosis not present

## 2015-03-20 DIAGNOSIS — I10 Essential (primary) hypertension: Secondary | ICD-10-CM | POA: Diagnosis not present

## 2015-03-20 DIAGNOSIS — F419 Anxiety disorder, unspecified: Secondary | ICD-10-CM | POA: Diagnosis not present

## 2015-03-20 DIAGNOSIS — G894 Chronic pain syndrome: Secondary | ICD-10-CM | POA: Diagnosis not present

## 2015-03-20 DIAGNOSIS — Z1389 Encounter for screening for other disorder: Secondary | ICD-10-CM | POA: Diagnosis not present

## 2015-04-29 ENCOUNTER — Encounter: Payer: Self-pay | Admitting: Obstetrics and Gynecology

## 2015-04-29 ENCOUNTER — Ambulatory Visit (INDEPENDENT_AMBULATORY_CARE_PROVIDER_SITE_OTHER): Payer: BLUE CROSS/BLUE SHIELD | Admitting: Obstetrics and Gynecology

## 2015-04-29 VITALS — Ht 67.0 in | Wt 154.0 lb

## 2015-04-29 DIAGNOSIS — N950 Postmenopausal bleeding: Secondary | ICD-10-CM

## 2015-04-29 MED ORDER — VENLAFAXINE HCL ER 37.5 MG PO CP24: 37.5000 mg | ORAL_CAPSULE | Freq: Every day | ORAL | Status: DC

## 2015-04-29 NOTE — Progress Notes (Signed)
This chart was scribed for [name], MD by Erling Conte, ED Scribe.  The patient's care was started at 2:23 PM.   [facility] Visit  Patient name: [name] MRN [id number]  Date of birth: 10/07/1960  CC & HPI:  [name] is a 54 y.o. female presenting today to discuss hormones. Pt has a h/o breast cancer. She states she has not felt like herself and has been having mood swings and behavior changes. Pt also reports hot flashes. She also notes some mild vaginal bleeding. Pt is still sexually active with her husband. She has taken Lexapro in the past for her depression and anxiety and has also taken Elavil.   ROS:  +vaginal bleeding +hot flashes +mood swings No other complaints  Pertinent History Reviewed:   Reviewed: Significant for  Medical         Past Medical History  Diagnosis Date  . Chronic neck pain   . Chronic back pain   . Anemia   . Arthritis   . Wears glasses   . Dysrhythmia     hx palpitations-too much caffiene  . Cancer     breast                              Surgical Hx:    Past Surgical History  Procedure Laterality Date  . Cesarean section      x2  . Breast surgery      left partail mastectomy-cyst  . Ankle surgery      left ankle cyst  . Cervical disc surgery  1999    anterior and a posterier cerv fusionx2  . Tubal ligation      with last c-section  . Dilitation & currettage/hystroscopy with thermachoice ablation  07/10/2012    Procedure: [place];  Surgeon: [name], MD;  Location: [facility];  Service: Gynecology;  Laterality: N/A;  D5 54ml in, 13 ml out, temp 87degree celcius, total therapy time 28min 16sec  . Back surgery  2000    lumbar disc surgery  . Dilation and curettage of uterus    . Colonoscopy    . Ercp     Medications: Reviewed & Updated - see associated section                       Current outpatient prescriptions:  .  ALPRAZolam (XANAX) 0.5 MG tablet, Take  0.5 mg by mouth 3 (three) times daily as needed. For anxiety, Disp: , Rfl:  .  amitriptyline (ELAVIL) 25 MG tablet, Take 50 mg by mouth at bedtime., Disp: , Rfl:  .  anastrozole (ARIMIDEX) 1 MG tablet, Take 1 mg by mouth daily., Disp: , Rfl:  .  Calcium Carbonate (CALCIUM 600 PO), Take 600 mg by mouth daily., Disp: , Rfl:  .  cholecalciferol (VITAMIN D) 1000 UNITS tablet, Take 1,000 Units by mouth daily., Disp: , Rfl:  .  cyclobenzaprine (FLEXERIL) 10 MG tablet, Take 10 mg by mouth 3 (three) times daily as needed. For muscle spasms, Disp: , Rfl:  .  HYDROcodone-acetaminophen (NORCO) 10-325 MG per tablet, Take 1 tablet by mouth every 6 (six) hours as needed (pain)., Disp: , Rfl:  .  Multiple Vitamins-Minerals (CENTRUM SILVER ADULT 50+) TABS, Take 1 tablet by mouth daily., Disp: , Rfl:    Social History: Reviewed -  reports that she has never smoked. She has  never used smokeless tobacco.  Objective Findings:  Vitals: Height 5\' 7"  (1.702 m), weight 154 lb (69.854 kg), last menstrual period 12/06/2013.  Physical Examination: not done   Assessment & Plan:   A:  1. Post menopausal vasomotor symptoms w/ personality changes 2. Abnormal Vaginal Bleeding  P:  1. Plan to prescribe pt with Effexor medication to help with hot flashes and mood swings 2. Will schedule vaginal [place] and uterine biospy  I personally performed the services described in this documentation, which was SCRIBED in my presence. The recorded information has been reviewed and considered accurate. It has been edited as necessary during review. [name], MD

## 2015-04-29 NOTE — Progress Notes (Signed)
Patient ID: [name], female   DOB: 20-Aug-1961, 54 y.o.   MRN: [id number] Pt here today to discuss hormones. Pt states that she has noticed that "I am just not my self". Pt's husband states that she is not acting like herself.

## 2015-05-12 ENCOUNTER — Other Ambulatory Visit: Payer: Self-pay | Admitting: Obstetrics and Gynecology

## 2015-05-12 DIAGNOSIS — N950 Postmenopausal bleeding: Secondary | ICD-10-CM

## 2015-05-13 ENCOUNTER — Ambulatory Visit (INDEPENDENT_AMBULATORY_CARE_PROVIDER_SITE_OTHER): Payer: BLUE CROSS/BLUE SHIELD | Admitting: Obstetrics and Gynecology

## 2015-05-13 ENCOUNTER — Other Ambulatory Visit: Payer: Self-pay | Admitting: Obstetrics and Gynecology

## 2015-05-13 ENCOUNTER — Ambulatory Visit (INDEPENDENT_AMBULATORY_CARE_PROVIDER_SITE_OTHER): Payer: BLUE CROSS/BLUE SHIELD

## 2015-05-13 ENCOUNTER — Encounter: Payer: Self-pay | Admitting: Obstetrics and Gynecology

## 2015-05-13 VITALS — BP 130/90 | Ht 67.0 in

## 2015-05-13 DIAGNOSIS — N950 Postmenopausal bleeding: Secondary | ICD-10-CM | POA: Diagnosis not present

## 2015-05-13 NOTE — Progress Notes (Deleted)
This chart was scribed for [facility] Kind, MD by Erling Conte, ED Scribe. This patient was seen in room 2 and the patient's care was started at 11:05 AM.    [facility] Visit  Patient name: [name] MRN [id number]  Date of birth: 13-May-1961  CC & HPI:  [name] is a 54 y.o. female presenting today for follow up of transvaginal US that was performed this morning. Pt has no complaints at this time. She reports success after taking Effexor medication in resolving her irritability and hot sweats that pt's husband was noting..  [place] results 05/13/15: heterogenous anteverted uterus w/mult fibroids, #1) post subserosal fibroid 4.6 x 4.8 x 3.6cm, (#2) post lt subserosal fibroid 4.3 x 3.7 x 2.6cm, thick EEC 72mm with a small amount of fluid within the endometrium,normal ov's bilat (mobile),no pain during ultrasound. I interpret the endometrium as thinner, 3 mm with small amount of endometrial fluid.  Endometrial biopsy will be required and will be done today.  ROS:  10 Systems reviewed and all are negative for acute change except as noted in the HPI.   Pertinent History Reviewed:   Reviewed: Significant for chronic neck/back pain, breast cancer, arthritis and anemia Medical         Past Medical History  Diagnosis Date   Chronic neck pain    Chronic back pain    Anemia    Arthritis    Wears glasses    Dysrhythmia     hx palpitations-too much caffiene   Cancer     breast                              Surgical Hx:    Past Surgical History  Procedure Laterality Date   Cesarean section      x2   Breast surgery      left partail mastectomy-cyst   Ankle surgery      left ankle cyst   Cervical disc surgery  1999    anterior and a posterier cerv fusionx2   Tubal ligation      with last c-section   Dilitation & currettage/hystroscopy with thermachoice ablation  07/10/2012    Procedure: DILATATION & CURETTAGE/HYSTEROSCOPY WITH THERMACHOICE ABLATION;  Surgeon: [facility] Kind, MD;  Location: [facility];  Service: Gynecology;  Laterality: N/A;  D5 61ml in, 13 ml out, temp 87degree celcius, total therapy time 50min 16sec   Back surgery  2000    lumbar disc surgery   Dilation and curettage of uterus     Colonoscopy     Ercp     Medications: Reviewed & Updated - see associated section                       Current outpatient prescriptions:    ALPRAZolam (XANAX) 0.5 MG tablet, Take 0.5 mg by mouth 3 (three) times daily as needed. For anxiety, Disp: , Rfl:    amitriptyline (ELAVIL) 25 MG tablet, Take 50 mg by mouth at bedtime., Disp: , Rfl:    anastrozole (ARIMIDEX) 1 MG tablet, Take 1 mg by mouth daily., Disp: , Rfl:    Calcium Carbonate (CALCIUM 600 PO), Take 600 mg by mouth daily., Disp: , Rfl:    cholecalciferol (VITAMIN D) 1000 UNITS tablet, Take 1,000 Units by mouth daily., Disp: , Rfl:    cyclobenzaprine (FLEXERIL) 10 MG tablet, Take 10 mg by mouth 3 (three) times  daily as needed. For muscle spasms, Disp: , Rfl:    HYDROcodone-acetaminophen (NORCO) 10-325 MG per tablet, Take 1 tablet by mouth every 6 (six) hours as needed (pain)., Disp: , Rfl:    Multiple Vitamins-Minerals (CENTRUM SILVER ADULT 50+) TABS, Take 1 tablet by mouth daily., Disp: , Rfl:    venlafaxine XR (EFFEXOR XR) 37.5 MG 24 hr capsule, Take 1 capsule (37.5 mg total) by mouth daily with breakfast., Disp: 30 capsule, Rfl: 6   Social History: Reviewed -  reports that she has never smoked. She has never used smokeless tobacco.  Objective Findings:  Vitals: Blood pressure 130/90, height 5\' 7"  (1.702 m), last menstrual period 12/06/2013.  Endometrial Biopsy: Patient given informed consent, signed copy in the chart, time out was performed. Time out taken. The patient was placed in the lithotomy position and the cervix brought into view with sterile speculum.  Portio of cervix cleansed x 2 with betadine swabs.  A tenaculum was placed in the anterior lip of the cervix. The uterus was  sounded for depth of 5 cm,. Milex uterine Explora 3 mm was introduced to into the uterus, suction created,  and an endometrial sample was obtained. All equipment was removed and accounted for.   US performed by Dr. Glo Herring for guidance Procedure confirming satisfactory position and [place] confirmed small 2cm fibroids at top of uterus The patient tolerated the procedure well.   Examination chaperoned by Venita Lick, LPN   Patient given post procedure instructions.  Followup: as needed  Assessment & Plan:   A:  1. Abnormal Vaginal Bleeding postmenopausal. 2 stable uterine fibroids 3  Thin endometrium 4 resolved vasomotor sx and mood changes , on effexor.   P:  1. Will call with results of endometrial biopsy  I personally performed the services described in this documentation, which was SCRIBED in my presence. The recorded information has been reviewed and considered accurate. It has been edited as necessary during review. [facility] Kind, MD

## 2015-05-13 NOTE — Progress Notes (Signed)
US TV PELVIC: heterogenous anteverted uterus w/mult fibroids, #1) post subserosal fibroid 4.6 x 4.8 x 3.6cm, (#2) post lt subserosal fibroid 4.3 x 3.7 x 2.6cm, thick  EEC 74mm with a small amount of fluid within the endometrium,normal ov's bilat (mobile),no pain during ultrasound.

## 2015-05-13 NOTE — Progress Notes (Signed)
Patient ID: [name], female   DOB: 06-May-1961, 54 y.o.   MRN: [id number] Pt here today for Results and Endometrial Biopsy. Pt denies any problems or concerns at this time.

## 2015-05-18 DIAGNOSIS — Z23 Encounter for immunization: Secondary | ICD-10-CM | POA: Diagnosis not present

## 2015-05-18 NOTE — Progress Notes (Signed)
This chart was scribed for [name], MD by Erling Conte, ED Scribe. This patient was seen in room 2 and the patient's care was started at 11:05 AM.    [facility] Visit  Patient name: [name] MRN [id number]  Date of birth: [birth year]  CC & HPI:  [name] is a 54 y.o. female presenting today for follow up of transvaginal US that was performed this morning. Pt has no complaints at this time. She reports success after taking Effexor medication in resolving her irritability and hot sweats that pt's husband was noting..  [place] results 05/13/15: heterogenous anteverted uterus w/mult fibroids, #1) post subserosal fibroid 4.6 x 4.8 x 3.6cm, (#2) post lt subserosal fibroid 4.3 x 3.7 x 2.6cm, thick EEC 74mm with a small amount of fluid within the endometrium,normal ov's bilat (mobile),no pain during ultrasound. I interpret the endometrium as thinner, 3 mm with small amount of endometrial fluid.  Endometrial biopsy will be required and will be done today.  ROS:  10 Systems reviewed and all are negative for acute change except as noted in the HPI.   Pertinent History Reviewed:   Reviewed: Significant for chronic neck/back pain, breast cancer, arthritis and anemia Medical         Past Medical History  Diagnosis Date  . Chronic neck pain   . Chronic back pain   . Anemia   . Arthritis   . Wears glasses   . Dysrhythmia     hx palpitations-too much caffiene  . Cancer     breast                              Surgical Hx:    Past Surgical History  Procedure Laterality Date  . Cesarean section      x2  . Breast surgery      left partail mastectomy-cyst  . Ankle surgery      left ankle cyst  . Cervical disc surgery  1999    anterior and a posterier cerv fusionx2  . Tubal ligation      with last c-section  . Dilitation & currettage/hystroscopy with thermachoice ablation  07/10/2012    Procedure: [place];  Surgeon: [name], MD;  Location: [facility];  Service: Gynecology;  Laterality: N/A;  D5 52ml in, 13 ml out, temp 87degree celcius, total therapy time 50min 16sec  . Back surgery  2000    lumbar disc surgery  . Dilation and curettage of uterus    . Colonoscopy    . Ercp     Medications: Reviewed & Updated - see associated section                       Current outpatient prescriptions:  .  ALPRAZolam (XANAX) 0.5 MG tablet, Take 0.5 mg by mouth 3 (three) times daily as needed. For anxiety, Disp: , Rfl:  .  amitriptyline (ELAVIL) 25 MG tablet, Take 50 mg by mouth at bedtime., Disp: , Rfl:  .  anastrozole (ARIMIDEX) 1 MG tablet, Take 1 mg by mouth daily., Disp: , Rfl:  .  Calcium Carbonate (CALCIUM 600 PO), Take 600 mg by mouth daily., Disp: , Rfl:  .  cholecalciferol (VITAMIN D) 1000 UNITS tablet, Take 1,000 Units by mouth daily., Disp: , Rfl:  .  cyclobenzaprine (FLEXERIL) 10 MG tablet, Take 10 mg by mouth 3 (three) times  daily as needed. For muscle spasms, Disp: , Rfl:  .  HYDROcodone-acetaminophen (NORCO) 10-325 MG per tablet, Take 1 tablet by mouth every 6 (six) hours as needed (pain)., Disp: , Rfl:  .  Multiple Vitamins-Minerals (CENTRUM SILVER ADULT 50+) TABS, Take 1 tablet by mouth daily., Disp: , Rfl:  .  venlafaxine XR (EFFEXOR XR) 37.5 MG 24 hr capsule, Take 1 capsule (37.5 mg total) by mouth daily with breakfast., Disp: 30 capsule, Rfl: 6   Social History: Reviewed -  reports that she has never smoked. She has never used smokeless tobacco.  Objective Findings:  Vitals: Blood pressure 130/90, height 5\' 7"  (1.702 m), last menstrual period 12/06/2013.  Endometrial Biopsy: Patient given informed consent, signed copy in the chart, time out was performed. Time out taken. The patient was placed in the lithotomy position and the cervix brought into view with sterile speculum.  Portio of cervix cleansed x 2 with betadine swabs.  A tenaculum was placed in the anterior lip of the cervix. The uterus was  sounded for depth of 5 cm,. Milex uterine Explora 3 mm was introduced to into the uterus, suction created,  and an endometrial sample was obtained. All equipment was removed and accounted for.   US performed by Dr. Glo Herring for guidance Procedure confirming satisfactory position and [place] confirmed small 2cm fibroids at top of uterus The patient tolerated the procedure well.   Examination chaperoned by Venita Lick, LPN   Patient given post procedure instructions.  Followup: as needed  Assessment & Plan:   A:  1. Abnormal Vaginal Bleeding postmenopausal. 2 stable uterine fibroids 3  Thin endometrium 4 resolved vasomotor sx and mood changes , on effexor.   P:  1. Will call with results of endometrial biopsy  I personally performed the services described in this documentation, which was SCRIBED in my presence. The recorded information has been reviewed and considered accurate. It has been edited as necessary during review. [name], MD

## 2015-05-19 ENCOUNTER — Telehealth: Payer: Self-pay | Admitting: *Deleted

## 2015-05-19 ENCOUNTER — Telehealth: Payer: Self-pay | Admitting: Obstetrics and Gynecology

## 2015-05-19 NOTE — Telephone Encounter (Signed)
Pt given results of biopsy and aware that everything was normal.Pt very thankful.

## 2015-05-19 NOTE — Telephone Encounter (Signed)
-----   Message from [name], MD sent at 05/18/2015  5:22 AM EDT ----- Benign biopsy results.

## 2015-05-27 DIAGNOSIS — C50919 Malignant neoplasm of unspecified site of unspecified female breast: Secondary | ICD-10-CM | POA: Diagnosis not present

## 2015-05-27 DIAGNOSIS — M791 Myalgia: Secondary | ICD-10-CM | POA: Diagnosis not present

## 2015-05-27 DIAGNOSIS — R232 Flushing: Secondary | ICD-10-CM | POA: Diagnosis not present

## 2015-05-27 DIAGNOSIS — C50311 Malignant neoplasm of lower-inner quadrant of right female breast: Secondary | ICD-10-CM | POA: Diagnosis not present

## 2015-05-27 DIAGNOSIS — R413 Other amnesia: Secondary | ICD-10-CM | POA: Diagnosis not present

## 2015-05-27 DIAGNOSIS — Z789 Other specified health status: Secondary | ICD-10-CM | POA: Diagnosis not present

## 2015-05-27 DIAGNOSIS — Z9221 Personal history of antineoplastic chemotherapy: Secondary | ICD-10-CM | POA: Diagnosis not present

## 2015-05-27 DIAGNOSIS — E876 Hypokalemia: Secondary | ICD-10-CM | POA: Diagnosis not present

## 2015-05-27 DIAGNOSIS — H539 Unspecified visual disturbance: Secondary | ICD-10-CM | POA: Diagnosis not present

## 2015-05-27 DIAGNOSIS — F329 Major depressive disorder, single episode, unspecified: Secondary | ICD-10-CM | POA: Diagnosis not present

## 2015-05-27 DIAGNOSIS — M2550 Pain in unspecified joint: Secondary | ICD-10-CM | POA: Diagnosis not present

## 2015-07-01 ENCOUNTER — Other Ambulatory Visit (HOSPITAL_COMMUNITY): Payer: Self-pay | Admitting: Physician Assistant

## 2015-07-01 DIAGNOSIS — R945 Abnormal results of liver function studies: Secondary | ICD-10-CM

## 2015-07-02 ENCOUNTER — Other Ambulatory Visit (HOSPITAL_COMMUNITY): Payer: Self-pay | Admitting: Internal Medicine

## 2015-07-02 DIAGNOSIS — R945 Abnormal results of liver function studies: Secondary | ICD-10-CM

## 2015-07-06 ENCOUNTER — Ambulatory Visit (HOSPITAL_COMMUNITY)
Admission: RE | Admit: 2015-07-06 | Discharge: 2015-07-06 | Disposition: A | Discharge status: Home / Self Care | Payer: BLUE CROSS/BLUE SHIELD | Source: Ambulatory Visit | Attending: Internal Medicine | Admitting: Internal Medicine

## 2015-07-06 DIAGNOSIS — R945 Abnormal results of liver function studies: Secondary | ICD-10-CM

## 2015-07-06 DIAGNOSIS — Z853 Personal history of malignant neoplasm of breast: Secondary | ICD-10-CM | POA: Insufficient documentation

## 2015-09-25 DIAGNOSIS — C50219 Malignant neoplasm of upper-inner quadrant of unspecified female breast: Secondary | ICD-10-CM | POA: Diagnosis not present

## 2015-09-25 DIAGNOSIS — Z6823 Body mass index (BMI) 23.0-23.9, adult: Secondary | ICD-10-CM | POA: Diagnosis not present

## 2015-09-25 DIAGNOSIS — Z1389 Encounter for screening for other disorder: Secondary | ICD-10-CM | POA: Diagnosis not present

## 2015-09-25 DIAGNOSIS — G894 Chronic pain syndrome: Secondary | ICD-10-CM | POA: Diagnosis not present

## 2015-09-25 DIAGNOSIS — M461 Sacroiliitis, not elsewhere classified: Secondary | ICD-10-CM | POA: Diagnosis not present

## 2015-09-25 DIAGNOSIS — I10 Essential (primary) hypertension: Secondary | ICD-10-CM | POA: Diagnosis not present

## 2015-09-29 ENCOUNTER — Other Ambulatory Visit (HOSPITAL_COMMUNITY): Payer: Self-pay | Admitting: Internal Medicine

## 2015-09-29 DIAGNOSIS — C50311 Malignant neoplasm of lower-inner quadrant of right female breast: Secondary | ICD-10-CM

## 2015-10-08 ENCOUNTER — Other Ambulatory Visit (HOSPITAL_COMMUNITY): Payer: Self-pay | Admitting: Internal Medicine

## 2015-10-08 DIAGNOSIS — C50311 Malignant neoplasm of lower-inner quadrant of right female breast: Secondary | ICD-10-CM

## 2015-10-20 ENCOUNTER — Encounter (HOSPITAL_COMMUNITY): Payer: Medicare Other

## 2015-11-24 ENCOUNTER — Ambulatory Visit (HOSPITAL_COMMUNITY)
Admission: RE | Admit: 2015-11-24 | Discharge: 2015-11-24 | Disposition: A | Discharge status: Home / Self Care | Payer: BLUE CROSS/BLUE SHIELD | Source: Ambulatory Visit | Attending: Internal Medicine | Admitting: Internal Medicine

## 2015-11-24 DIAGNOSIS — C50311 Malignant neoplasm of lower-inner quadrant of right female breast: Secondary | ICD-10-CM | POA: Insufficient documentation

## 2015-12-06 ENCOUNTER — Emergency Department (HOSPITAL_COMMUNITY): Payer: BLUE CROSS/BLUE SHIELD

## 2015-12-06 ENCOUNTER — Emergency Department (HOSPITAL_COMMUNITY)
Admission: EM | Admit: 2015-12-06 | Discharge: 2015-12-06 | Disposition: A | Discharge status: Home / Self Care | Payer: BLUE CROSS/BLUE SHIELD | Attending: Emergency Medicine | Admitting: Emergency Medicine

## 2015-12-06 ENCOUNTER — Encounter (HOSPITAL_COMMUNITY): Payer: Self-pay | Admitting: *Deleted

## 2015-12-06 DIAGNOSIS — M542 Cervicalgia: Secondary | ICD-10-CM | POA: Diagnosis not present

## 2015-12-06 DIAGNOSIS — Z853 Personal history of malignant neoplasm of breast: Secondary | ICD-10-CM | POA: Insufficient documentation

## 2015-12-06 DIAGNOSIS — R064 Hyperventilation: Secondary | ICD-10-CM | POA: Diagnosis not present

## 2015-12-06 DIAGNOSIS — F419 Anxiety disorder, unspecified: Secondary | ICD-10-CM | POA: Insufficient documentation

## 2015-12-06 DIAGNOSIS — F418 Other specified anxiety disorders: Secondary | ICD-10-CM | POA: Diagnosis not present

## 2015-12-06 DIAGNOSIS — G8929 Other chronic pain: Secondary | ICD-10-CM

## 2015-12-06 DIAGNOSIS — R079 Chest pain, unspecified: Secondary | ICD-10-CM | POA: Diagnosis not present

## 2015-12-06 DIAGNOSIS — R51 Headache: Secondary | ICD-10-CM | POA: Diagnosis not present

## 2015-12-06 HISTORY — DX: Headache, unspecified: R51.9

## 2015-12-06 HISTORY — DX: Major depressive disorder, single episode, unspecified: F32.9

## 2015-12-06 HISTORY — DX: Depression, unspecified: F32.A

## 2015-12-06 HISTORY — DX: Headache: R51

## 2015-12-06 LAB — CBC WITH DIFFERENTIAL/PLATELET
BASOS ABS: 0 10*3/uL (ref 0.0–0.1)
Basophils Relative: 1 %
Eosinophils Absolute: 0 10*3/uL (ref 0.0–0.7)
Eosinophils Relative: 1 %
HEMATOCRIT: 33.9 % — AB (ref 36.0–46.0)
Hemoglobin: 11.5 g/dL — ABNORMAL LOW (ref 12.0–15.0)
LYMPHS PCT: 34 %
Lymphs Abs: 1.5 10*3/uL (ref 0.7–4.0)
MCH: 31.8 pg (ref 26.0–34.0)
MCHC: 33.9 g/dL (ref 30.0–36.0)
MCV: 93.6 fL (ref 78.0–100.0)
MONO ABS: 0.4 10*3/uL (ref 0.1–1.0)
Monocytes Relative: 8 %
NEUTROS ABS: 2.4 10*3/uL (ref 1.7–7.7)
Neutrophils Relative %: 56 %
Platelets: 280 10*3/uL (ref 150–400)
RBC: 3.62 MIL/uL — AB (ref 3.87–5.11)
RDW: 13.5 % (ref 11.5–15.5)
WBC: 4.3 10*3/uL (ref 4.0–10.5)

## 2015-12-06 LAB — URINALYSIS, ROUTINE W REFLEX MICROSCOPIC
Bilirubin Urine: NEGATIVE
GLUCOSE, UA: NEGATIVE mg/dL
HGB URINE DIPSTICK: NEGATIVE
Ketones, ur: NEGATIVE mg/dL
NITRITE: NEGATIVE
PROTEIN: NEGATIVE mg/dL
Specific Gravity, Urine: 1.005 (ref 1.005–1.030)
pH: 8 (ref 5.0–8.0)

## 2015-12-06 LAB — BASIC METABOLIC PANEL
Anion gap: 9 (ref 5–15)
BUN: 11 mg/dL (ref 6–20)
CALCIUM: 9.4 mg/dL (ref 8.9–10.3)
CHLORIDE: 105 mmol/L (ref 101–111)
CO2: 21 mmol/L — AB (ref 22–32)
CREATININE: 0.6 mg/dL (ref 0.44–1.00)
GFR calc Af Amer: >60 mL/min (ref >60)
GFR calc non Af Amer: >60 mL/min (ref >60)
GLUCOSE: 130 mg/dL — AB (ref 65–99)
Potassium: 3.2 mmol/L — ABNORMAL LOW (ref 3.5–5.1)
Sodium: 135 mmol/L (ref 135–145)

## 2015-12-06 LAB — URINE MICROSCOPIC-ADD ON
RBC / HPF: NONE SEEN RBC/hpf (ref 0–5)
WBC, UA: NONE SEEN WBC/hpf (ref 0–5)

## 2015-12-06 LAB — ETHANOL: Alcohol, Ethyl (B): <5 mg/dL (ref <5)

## 2015-12-06 LAB — RAPID URINE DRUG SCREEN, HOSP PERFORMED
AMPHETAMINES: NOT DETECTED
BARBITURATES: NOT DETECTED
Benzodiazepines: NOT DETECTED
COCAINE: NOT DETECTED
Opiates: NOT DETECTED
TETRAHYDROCANNABINOL: NOT DETECTED

## 2015-12-06 LAB — TROPONIN I: Troponin I: <0.03 ng/mL (ref <0.031)

## 2015-12-06 MED ORDER — LORAZEPAM 0.5 MG PO TABS
0.5000 mg | ORAL_TABLET | Freq: Once | ORAL | Status: AC
  Administered 2015-12-06: 0.5 mg via ORAL
  Filled 2015-12-06: qty 1

## 2015-12-06 MED ORDER — HYDROCODONE-ACETAMINOPHEN 5-325 MG PO TABS
1.0000 | ORAL_TABLET | Freq: Once | ORAL | Status: AC
  Administered 2015-12-06: 1 via ORAL
  Filled 2015-12-06: qty 1

## 2015-12-06 NOTE — ED Notes (Signed)
Per TTS pt does not meet inpatient criteria. Ask for out pt resource paperwork.

## 2015-12-06 NOTE — ED Notes (Signed)
Pt alert & oriented x4, stable gait. Patient given discharge instructions, paperwork & prescription(s). Patient  instructed to stop at the registration desk to finish any additional paperwork. Patient verbalized understanding. Pt left department w/ no further questions. 

## 2015-12-06 NOTE — ED Notes (Signed)
Pt now complains of neck pain, states that her neck started hurting more after laying on her side,

## 2015-12-06 NOTE — BH Assessment (Addendum)
Tele Assessment Note   [name] is an 55 y.o. female. Presenting to ED with c/o stress due to caring for husband's mother who has dementia and lives in the home with pt and spouse.  Pt reports feeling overwhelmed and frustrated and states "I feel like I was having a nervous breakdown". Pt reports she is unable to work due to disability. Pt reports h/o cancer and back surgery.  Pt states husband works and family members will not help with caregiving. Pt reports increased headaches and current chest pain.   Pt reports h/o depression and anxiety with panic attacks. Pt reports panic attacks have increased (daily) since role as caregiver.  Pt reports compliance with prescribed Vicodin, Flexeril, Xanax and Lexapro.  Pt denies SI, HI and hallucinations.    Diagnosis: MDD, GAD w/ panic attacks (per pt report)  Past Medical History:  Past Medical History  Diagnosis Date  . Chronic neck pain   . Chronic back pain   . Anemia   . Arthritis   . Wears glasses   . Dysrhythmia     hx palpitations-too much caffiene  . Cancer ([name])     breast  . Depression   . Headache     Past Surgical History  Procedure Laterality Date  . Cesarean section      x2  . Breast surgery      left partail mastectomy-cyst  . Ankle surgery      left ankle cyst  . Cervical disc surgery  1999    anterior and a posterier cerv fusionx2  . Tubal ligation      with last c-section  . Dilitation & currettage/hystroscopy with thermachoice ablation  07/10/2012    Procedure: [place];  Surgeon: [facility] Kind, MD;  Location: [name];  Service: Gynecology;  Laterality: N/A;  D5 69ml in, 13 ml out, temp 87degree celcius, total therapy time 68min 16sec  . Back surgery  2000    lumbar disc surgery  . Dilation and curettage of uterus    . Colonoscopy    . Ercp      Family History:  Family History  Problem Relation Age of Onset  . Hypertension Mother     Social History:   reports that she has never smoked. She has never used smokeless tobacco. She reports that she does not drink alcohol or use illicit drugs.  Additional Social History:  Alcohol / Drug Use Pain Medications: None Reported Prescriptions: Pt reports compliance with medications Over the Counter: None Reported History of alcohol / drug use?: No history of alcohol / drug abuse  CIWA: CIWA-Ar BP: 136/75 mmHg Pulse Rate: 92 COWS:    PATIENT STRENGTHS: (choose at least two) Average or above average intelligence Capable of independent living  Allergies: No Known Allergies  Home Medications:  (Not in a hospital admission)  OB/GYN Status:  Patient's last menstrual period was 12/06/2013.  General Assessment Data Location of Assessment: [facility] Assessment: In system Is this a Tele or Face-to-Face Assessment?: Tele Assessment Is this an Initial Assessment or a Re-assessment for this encounter?: Initial Assessment Marital status: Married [name] Is patient pregnant?: No Pregnancy Status: No Living Arrangements: Spouse/significant other Can pt return to current living arrangement?: Yes Admission Status: Voluntary Is patient capable of signing voluntary admission?: Yes Referral Source: Self/Family/Friend Insurance type: BCBS     Crisis Care Plan Living Arrangements: Spouse/significant other Name of Psychiatrist: None Name of Therapist: None  Education Status  Is patient currently in school?: No Highest grade of school patient has completed: 12th Grade  Risk to self with the past 6 months Suicidal Ideation: No Has patient been a risk to self within the past 6 months prior to admission? : No Suicidal Intent: No Has patient had any suicidal intent within the past 6 months prior to admission? : No Is patient at risk for suicide?: No Suicidal Plan?: No Has patient had any suicidal plan within the past 6 months prior to admission? : No Access to Means: No What has been  your use of drugs/alcohol within the last 12 months?: None Reported Previous Attempts/Gestures: No Other Self Harm Risks: [place] stress/compasson fatigue Triggers for Past Attempts: None known Intentional Self Injurious Behavior: None Family Suicide History: No Persecutory voices/beliefs?: No Depression: Yes Depression Symptoms: Tearfulness, Fatigue, Feeling angry/irritable Substance abuse history and/or treatment for substance abuse?: No Suicide prevention information given to non-admitted patients: Yes  Risk to Others within the past 6 months Homicidal Ideation: No Does patient have any lifetime risk of violence toward others beyond the six months prior to admission? : No Thoughts of Harm to Others: No Current Homicidal Intent: No Current Homicidal Plan: No Access to Homicidal Means: No History of harm to others?: No Assessment of Violence: None Noted Does patient have access to weapons?: No Criminal Charges Pending?: No Does patient have a court date: No Is patient on probation?: No  Psychosis Hallucinations: None noted Delusions: None noted  Mental Status Report Appearance/Hygiene: In hospital gown Eye Contact: Poor (Eyes closed throughout majority of assessment) Motor Activity: Unremarkable Speech: Logical/coherent, Tangential Level of Consciousness: Alert Mood: Anxious, Depressed Affect: Appropriate to circumstance Anxiety Level: Moderate Thought Processes: Coherent, Relevant Judgement: Unimpaired Orientation: Person, Place, Situation, Time Obsessive Compulsive Thoughts/Behaviors: None  Cognitive Functioning Concentration: Fair Memory: Recent Intact, Remote Intact IQ: Average Insight: Fair Impulse Control: Good Appetite: Good Weight Loss: 0 Weight Gain: 0 Sleep: Decreased Total Hours of Sleep: 5 Vegetative Symptoms: None  ADLScreening [facility] Assessment Services) Patient's cognitive ability adequate to safely complete daily activities?: Yes Patient able  to express need for assistance with ADLs?: Yes Independently performs ADLs?: Yes (appropriate for developmental age)  Prior Inpatient Therapy Prior Inpatient Therapy: No  Prior Outpatient Therapy Prior Outpatient Therapy: No Does patient have an ACCT team?: No Does patient have Intensive In-House Services?  : No Does patient have [facility] services? : No Does patient have P4CC services?: No  ADL Screening (condition at time of admission) Patient's cognitive ability adequate to safely complete daily activities?: Yes Is the patient deaf or have difficulty hearing?: No Does the patient have difficulty seeing, even when wearing glasses/contacts?: Yes Does the patient have difficulty concentrating, remembering, or making decisions?: Yes Patient able to express need for assistance with ADLs?: Yes Does the patient have difficulty dressing or bathing?: No Independently performs ADLs?: Yes (appropriate for developmental age) Does the patient have difficulty walking or climbing stairs?: No Weakness of Legs: None Weakness of Arms/Hands: None  Home Assistive Devices/Equipment Home Assistive Devices/Equipment: None  Therapy Consults (therapy consults require a physician order) PT Evaluation Needed: No OT Evalulation Needed: No SLP Evaluation Needed: No Abuse/Neglect Assessment (Assessment to be complete while patient is alone) Physical Abuse: Denies Verbal Abuse: Denies Sexual Abuse: Denies Exploitation of patient/patient's resources: Denies Self-Neglect: Denies Values / Beliefs Cultural Requests During Hospitalization: None Spiritual Requests During Hospitalization: None Consults Spiritual Care Consult Needed: No Social Work Consult Needed: No Regulatory affairs officer (For Healthcare) Does patient have an  advance directive?: No Would patient like information on creating an advanced directive?: No - patient declined information    Additional Information 1:1 In Past 12 Months?: No CIRT  Risk: No Elopement Risk: No Does patient have medical clearance?: No     Disposition: Per Serena Colonel, PA pt does not meet criteria for inpatient placement and should be provided with outpatient resources for OPT. Ron, RN  And EDP [facility] informed of pt disposition.  Disposition Initial Assessment Completed for this Encounter: Yes Disposition of Patient: Other dispositions (Pending Psychiatric Extender Recommendation)  Takyla Kuchera J Martinique 12/06/2015 10:35 PM

## 2015-12-06 NOTE — ED Notes (Signed)
Pt arrived to er, hyperventilating, slide herself out of the wheelchair to the floor in front of registration, pt tearful, has hard time concentrating on task on hand, pt assisted back to wheelchair, pt tried to slide out of wheelchair again, pt admits to chest pain and left side numbness for a month,

## 2015-12-06 NOTE — ED Provider Notes (Signed)
CSN: [id number]     Arrival date & time 12/06/15  1936 History   First MD Initiated Contact with Patient 12/06/15 1958     Chief Complaint  Patient presents with  . V70.1      HPI Pt was seen at 2010. Per pt, c/o gradual onset and worsening of persistent depression and anxiety for the past 3 months. Pt states she has been "stressed out" since caring for her elderly demented mother in January. States "no family is willing to help" and she "just reached a point tonight." Denies SA, no HI, no hallucinations.    Past Medical History  Diagnosis Date  . Chronic neck pain   . Chronic back pain   . Anemia   . Arthritis   . Wears glasses   . Dysrhythmia     hx palpitations-too much caffiene  . Cancer ([place])     breast  . Depression   . Headache    Past Surgical History  Procedure Laterality Date  . Cesarean section      x2  . Breast surgery      left partail mastectomy-cyst  . Ankle surgery      left ankle cyst  . Cervical disc surgery  1999    anterior and a posterier cerv fusionx2  . Tubal ligation      with last c-section  . Dilitation & currettage/hystroscopy with thermachoice ablation  07/10/2012    Procedure: Arma;  Surgeon: [name], MD;  Location: [facility];  Service: Gynecology;  Laterality: N/A;  D5 1ml in, 13 ml out, temp 87degree celcius, total therapy time 53min 16sec  . Back surgery  2000    lumbar disc surgery  . Dilation and curettage of uterus    . Colonoscopy    . Ercp     Family History  Problem Relation Age of Onset  . Hypertension Mother    Social History  Substance Use Topics  . Smoking status: Never Smoker   . Smokeless tobacco: Never Used  . Alcohol Use: No    Review of Systems ROS: Statement: All systems negative except as marked or noted in the HPI; Constitutional: Negative for fever and chills. ; ; Eyes: Negative for eye pain, redness and discharge. ; ; ENMT: Negative for ear pain,  hoarseness, nasal congestion, sinus pressure and sore throat. ; ; Cardiovascular: Negative for chest pain, palpitations, diaphoresis, dyspnea and peripheral edema. ; ; Respiratory: Negative for cough, wheezing and stridor. ; ; Gastrointestinal: Negative for nausea, vomiting, diarrhea, abdominal pain, blood in stool, hematemesis, jaundice and rectal bleeding. . ; ; Genitourinary: Negative for dysuria, flank pain and hematuria. ; ; Musculoskeletal: +chronic neck pain. Negative for back pain. Negative for swelling and trauma.; ; Skin: Negative for pruritus, rash, abrasions, blisters, bruising and skin lesion.; ; Neuro: Negative for headache, lightheadedness and neck stiffness. Negative for weakness, altered level of consciousness , altered mental status, extremity weakness, paresthesias, involuntary movement, seizure and syncope.; Psych:  +anxiety. No SI, no SA, no HI, no hallucinations.   Allergies  Review of patient's allergies indicates no known allergies.  Home Medications   Prior to Admission medications   Medication Sig Start Date End Date Taking? Authorizing Provider  ALPRAZolam Duanne Moron) 0.5 MG tablet Take 0.5 mg by mouth 3 (three) times daily as needed. For anxiety    Historical Provider, MD  amitriptyline (ELAVIL) 25 MG tablet Take 50 mg by mouth at bedtime.    Historical Provider,  MD  anastrozole (ARIMIDEX) 1 MG tablet Take 1 mg by mouth daily.    Historical Provider, MD  Calcium Carbonate (CALCIUM 600 PO) Take 600 mg by mouth daily.    Historical Provider, MD  cholecalciferol (VITAMIN D) 1000 UNITS tablet Take 1,000 Units by mouth daily.    Historical Provider, MD  cyclobenzaprine (FLEXERIL) 10 MG tablet Take 10 mg by mouth 3 (three) times daily as needed. For muscle spasms    Historical Provider, MD  HYDROcodone-acetaminophen (NORCO) 10-325 MG per tablet Take 1 tablet by mouth every 6 (six) hours as needed (pain).    Historical Provider, MD  Multiple Vitamins-Minerals (CENTRUM SILVER ADULT  50+) TABS Take 1 tablet by mouth daily.    Historical Provider, MD  venlafaxine XR (EFFEXOR XR) 37.5 MG 24 hr capsule Take 1 capsule (37.5 mg total) by mouth daily with breakfast. 04/29/15   [name], MD   BP 130/80 mmHg  Pulse 104  Temp(Src) 98.3 F (36.8 C) (Oral)  Resp 38  Ht 5\' 7"  (1.702 m)  Wt 157 lb (71.215 kg)  BMI 24.58 kg/m2  SpO2 100%  LMP 12/06/2013  BP 136/75 mmHg  Pulse 92  Temp(Src) 98.3 F (36.8 C) (Oral)  Resp 19  Ht 5\' 7"  (1.702 m)  Wt 157 lb (71.215 kg)  BMI 24.58 kg/m2  SpO2 97%  LMP 12/06/2013  Physical Exam  2015: Physical examination:  Nursing notes reviewed; Vital signs and O2 SAT reviewed;  Constitutional: Well developed, Well nourished, Well hydrated, Tearful.; Head:  Normocephalic, atraumatic; Eyes: EOMI, PERRL, No scleral icterus; ENMT: Mouth and pharynx normal, Mucous membranes moist; Neck: Supple, Full range of motion, No lymphadenopathy; Cardiovascular: Regular rate and rhythm, No gallop; Respiratory: Breath sounds clear & equal bilaterally, No rales, rhonchi, wheezes.  Speaking full sentences with ease, Normal respiratory effort/excursion. Intermittently hyperventilating.; Chest: Nontender, Movement normal; Abdomen: Soft, Nontender, Nondistended, Normal bowel sounds; Genitourinary: No CVA tenderness; Extremities: Pulses normal, No tenderness, No edema, No calf edema or asymmetry.; Neuro: AA&Ox3, Major CN grossly intact.  Speech clear. No gross focal motor or sensory deficits in extremities.; Skin: Color normal, Warm, Dry.; Psych:  Affect flat, poor eye contact. Entire HPI given to me with her eyes closed.    ED Course  Procedures (including critical care time) Labs Review  Imaging Review  I have personally reviewed and evaluated these images and lab results as part of my medical decision-making.   EKG Interpretation   Date/Time:  Sunday December 06 2015 19:58:51 EDT Ventricular Rate:  103 PR Interval:  149 QRS Duration: 85 QT Interval:   359 QTC Calculation: 470 R Axis:   56 Text Interpretation:  Sinus tachycardia When compared with ECG of  07/10/2012 Rate faster Otherwise no significant change Confirmed by [facility]   MD, Nunzio Cory [id number]) on 12/06/2015 8:19:48 PM      MDM  MDM Reviewed: previous chart, nursing note and vitals Reviewed previous: labs, ECG and MRI Interpretation: labs, ECG, x-ray and CT scan   Results for orders placed or performed during the hospital encounter of 12/06/15  Troponin I  Result Value Ref Range   Troponin I <0.03 <0.031 ng/mL  Basic metabolic panel  Result Value Ref Range   Sodium 135 135 - 145 mmol/L   Potassium 3.2 (L) 3.5 - 5.1 mmol/L   Chloride 105 101 - 111 mmol/L   CO2 21 (L) 22 - 32 mmol/L   Glucose, Bld 130 (H) 65 - 99 mg/dL   BUN 11 6 -  20 mg/dL   Creatinine, Ser 0.60 0.44 - 1.00 mg/dL   Calcium 9.4 8.9 - 10.3 mg/dL   GFR calc non Af Amer >60 >60 mL/min   GFR calc Af Amer >60 >60 mL/min   Anion gap 9 5 - 15  Ethanol  Result Value Ref Range   Alcohol, Ethyl (B) <5 <5 mg/dL  Urine rapid drug screen (hosp performed)  Result Value Ref Range   Opiates NONE DETECTED NONE DETECTED   Cocaine NONE DETECTED NONE DETECTED   Benzodiazepines NONE DETECTED NONE DETECTED   Amphetamines NONE DETECTED NONE DETECTED   Tetrahydrocannabinol NONE DETECTED NONE DETECTED   Barbiturates NONE DETECTED NONE DETECTED  Urinalysis, Routine w reflex microscopic  Result Value Ref Range   Color, Urine YELLOW YELLOW   APPearance CLEAR CLEAR   Specific Gravity, Urine 1.005 1.005 - 1.030   pH 8.0 5.0 - 8.0   Glucose, UA NEGATIVE NEGATIVE mg/dL   Hgb urine dipstick NEGATIVE NEGATIVE   Bilirubin Urine NEGATIVE NEGATIVE   Ketones, ur NEGATIVE NEGATIVE mg/dL   Protein, ur NEGATIVE NEGATIVE mg/dL   Nitrite NEGATIVE NEGATIVE   Leukocytes, UA TRACE (A) NEGATIVE  Urine microscopic-add on  Result Value Ref Range   Squamous Epithelial / LPF 0-5 (A) NONE SEEN   WBC, UA NONE SEEN 0 - 5 WBC/hpf   RBC /  HPF NONE SEEN 0 - 5 RBC/hpf   Bacteria, UA RARE (A) NONE SEEN  CBC with Differential  Result Value Ref Range   WBC 4.3 4.0 - 10.5 K/uL   RBC 3.62 (L) 3.87 - 5.11 MIL/uL   Hemoglobin 11.5 (L) 12.0 - 15.0 g/dL   HCT 33.9 (L) 36.0 - 46.0 %   MCV 93.6 78.0 - 100.0 fL   MCH 31.8 26.0 - 34.0 pg   MCHC 33.9 30.0 - 36.0 g/dL   RDW 13.5 11.5 - 15.5 %   Platelets 280 150 - 400 K/uL   Neutrophils Relative % 56 %   Neutro Abs 2.4 1.7 - 7.7 K/uL   Lymphocytes Relative 34 %   Lymphs Abs 1.5 0.7 - 4.0 K/uL   Monocytes Relative 8 %   Monocytes Absolute 0.4 0.1 - 1.0 K/uL   Eosinophils Relative 1 %   Eosinophils Absolute 0.0 0.0 - 0.7 K/uL   Basophils Relative 1 %   Basophils Absolute 0.0 0.0 - 0.1 K/uL   Dg Chest 2 View 12/06/2015  CLINICAL DATA:  Hyperventilation EXAM: CHEST  2 VIEW COMPARISON:  05/15/2014 FINDINGS: Cardiac shadow is within normal limits. The lungs are clear bilaterally. Postsurgical changes in the right breast are again seen. No acute bony abnormality is seen. Cervical spine surgery is noted as well. IMPRESSION: No acute abnormality noted. Electronically Signed   By: Inez Catalina M.D.   On: 12/06/2015 21:04   Ct Head Wo Contrast 12/06/2015  CLINICAL DATA:  Chronic headaches and neck pain. Neck surgery x3. History of breast cancer 2014. EXAM: CT HEAD WITHOUT CONTRAST CT CERVICAL SPINE WITHOUT CONTRAST TECHNIQUE: Multidetector CT imaging of the head and cervical spine was performed following the standard protocol without intravenous contrast. Multiplanar CT image reconstructions of the cervical spine were also generated. COMPARISON:  None. FINDINGS: CT HEAD FINDINGS Ventricles, cisterns and other CSF spaces are within normal. There is no mass, mass effect, shift of midline structures or acute hemorrhage. There is no evidence of acute infarction. Bones soft tissues are normal. CT CERVICAL SPINE FINDINGS Posterior fusion hardware is intact from C4-C6. Anterior fusion hardware  intact at the  C5-6 level. Intervertebral spacer at the C5-6 level. Prior laminectomy at the C4 and C5 level. Vertebral body alignment and heights are normal. There is mild spondylosis throughout the cervical spine. There is disc space narrowing at the C4-5 level. Prevertebral soft tissues are within normal. There is mild uncovertebral joint spurring. Facet arthropathy is present. Atlantoaxial articulation is within normal. There is no acute fracture or subluxation. IMPRESSION: No acute intracranial findings per No acute cervical spine injury. Mild spondylosis of the cervical spine with anterior fusion hardware at the C5-6 level and posterior fusion hardware from C4-C6 intact. Electronically Signed   By: Marin Olp M.D.   On: 12/06/2015 21:09   Ct Cervical Spine Wo Contrast 12/06/2015  CLINICAL DATA:  Chronic headaches and neck pain. Neck surgery x3. History of breast cancer 2014. EXAM: CT HEAD WITHOUT CONTRAST CT CERVICAL SPINE WITHOUT CONTRAST TECHNIQUE: Multidetector CT imaging of the head and cervical spine was performed following the standard protocol without intravenous contrast. Multiplanar CT image reconstructions of the cervical spine were also generated. COMPARISON:  None. FINDINGS: CT HEAD FINDINGS Ventricles, cisterns and other CSF spaces are within normal. There is no mass, mass effect, shift of midline structures or acute hemorrhage. There is no evidence of acute infarction. Bones soft tissues are normal. CT CERVICAL SPINE FINDINGS Posterior fusion hardware is intact from C4-C6. Anterior fusion hardware intact at the C5-6 level. Intervertebral spacer at the C5-6 level. Prior laminectomy at the C4 and C5 level. Vertebral body alignment and heights are normal. There is mild spondylosis throughout the cervical spine. There is disc space narrowing at the C4-5 level. Prevertebral soft tissues are within normal. There is mild uncovertebral joint spurring. Facet arthropathy is present. Atlantoaxial articulation is  within normal. There is no acute fracture or subluxation. IMPRESSION: No acute intracranial findings per No acute cervical spine injury. Mild spondylosis of the cervical spine with anterior fusion hardware at the C5-6 level and posterior fusion hardware from C4-C6 intact. Electronically Signed   By: Marin Olp M.D.   On: 12/06/2015 21:09      2015:  Pt arrived to ED hyperventilating, sliding herself out of the wheelchair to the floor x2, tearful, and c/o various somatic complaints: acute flair of her chronic neck pain, constant CP and constant left sided "numbness" for 1 month. Will dose pt's usual chronic pain meds and ativan for anxiety while workup progresses. Pt will need TTS consult.   2115:  Pt calmer, no longer hyperventilating. Workup reassuring. TTS eval pending.    Francine Graven, DO 12/06/15 2146

## 2015-12-06 NOTE — Discharge Instructions (Signed)
Community Resource Guide Outpatient Counseling/Substance Abuse Adult °The [employer]’s “211” is a great source of information about community services available.  Access by dialing 2-1-1 from anywhere in [place], or by website -  www.nc211.org.  ° °Other Local Resources (Updated 09/2015) ° °Crisis Hotlines °  °Services  ° °  °Area Served  °[facility] • Crisis Hotline, available 24 hours a day, 7 days a week: 800-939-5911 [place], [place]  ° [facility] • Crisis Hotline, available 24 hours a day, 7 days a week: 866-275-9552 [place], [place]  °[facility] • Suicide Prevention Hotline, available 24 hours a day, 7 days a week: 800-273-8255 [place], [place]  °[facility] ° • Crisis Hotline, available 24 hours a day, 7 days a week: 336-676-6840 [place], [place] °  °[facility] • Crisis Hotline, available 24 hours a day, 7 days a week: 800-256-2452 All °  °[facility] • Crisis Hotline, available 24 hours a day, 7 days a week: 877-626-1772 All  ° °Other Local Resources (Updated 09/2015) ° °Outpatient Counseling/ Substance Abuse Programs  °Services  ° °  °Address and Phone Number  °[facility] ° • Options include Individual counseling, group counseling, intensive outpatient program (several hours a day, several days a week) °• Offers depression assessments °• Provides methadone maintenance program 336-333-6860 °[address], [address] °[place], [place] [address] °  °[facility] ° • Offers partial hospitalization/day treatment and DUI/DWI programs °• Accepts Medicare, private insurance [phone] °[address], [address] °[place], [place] [address]  °[facility] ° ° • Services include intensive outpatient program (several hours a day, several days a week), outpatient treatment, DUI/DWI services, family education °• Also has some services specifically for Veterans °• Offers transitional housing   [phone] °[address] °[place], [place] [address] °  °  °[facility] • Accepts Medicare, private pay, and private insurance [phone] °[address] °[place], [place] [address]  °[facility] • Services include individual counseling, substance abuse intensive outpatient program (several hours a day, several days a week), day treatment °• Accepts Medicare, Medicaid, private insurance [phone] °[address] °[place], [place] [address]  °[facility] ° • Offers substance abuse intensive outpatient program (several hours a day, several days a week), partial hospitalization program 336-832-9800 °[address] °[place], [place] [address] ° °336-349-4454 °[address] °[place], [place] [address] ° °[phone] °[address] °[place], [place] [address] ° °[phone] °[address] °[place], [place] [address]  °[facility] • Individual counseling only °• Accepts private insurance only [phone] °[address] °[place], [place] [address]  °[facility] • Methadone maintenance program 800-805-6989 °[address] °[place], [place] [address]  °[facility] • Walk-In Clinic providing substance abuse and mental health counseling °• Accepts Medicaid, Medicare, private insurance °• Offers sliding scale for uninsured 336-342-8316 °[address] °[place], [place]   °[facility] • Offers individual counseling, and intensive in-home services 336-347-7415 °[address] °[place], [place] [address]  °[facility] • Offers individual counseling, family counseling, group therapy, domestic violence counseling, consumer credit counseling °• Accepts Medicare, Medicaid, private insurance °• Offers sliding scale for uninsured 336-387-6161 °[address] °, [place] [address] ° °336-889-6161 °[place], [address] °[place], [place] [address]  °[facility] • Offers individual, family   and group counseling °• 3 locations - [place], [place], and [place] ° 336-899-8800 ° °[address] °[place], [place] [address] ° °[address] °[place], [place] [address] ° °[address] °[place], [place] [address]  °[facility]  ° • Offers psychiatric assessment, 8-week Intensive Outpatient Program (several hours a day, several times a week, daytime or evenings), early recovery group, family Program, medication management °• Private pay or private insurance only 336 -[phone], or  °800-659-3381 °[address] °[place], [place] [address]  °[facility] • Offers individual, couples and family counseling °• Accepts Medicaid, private insurance, and sliding scale for uninsured 336-542-2076 °[address] °[place], [place] [address]  °David Fuller, MD • Individual counseling °• Private insurance 336-852-4051 °[address] °[place], [place] [address]  °[facility] ° • Offers assessment, substance abuse treatment, and behavioral health treatment [phone] °[address] °[place], [place] [address]  °[facility] • Individual counseling °• Accepts private insurance 336-272-1972 °[address] °[place], [place] [address]  °[facility] • Individual counseling °• Accepts Medicare, private insurance [phone] °[address] °[place], [place] [address]  °[facility]  ° • Offers intensive outpatient program (several hours a day, several times a week) °• Private pay, private insurance [phone] °[address] °[place], [place]  °[facility] • Individual counseling °• Medicare, private insurance [phone] °[address] °[place], [place] [address]  °[facility]  ° • Offers intensive outpatient program (several hours a day, several times a week) and partial hospitalization  program 336-794-3550 °[address] °[place], [place] [address]  °Parrish McKinney, MD • Individual counseling 336-282-1251 °[address] °[place], [place] [address]  °[facility] • Offers Christian counseling to individuals, couples, and families °• Accepts Medicare and private insurance; offers sliding scale for uninsured 336-288-1484 °[address] °[place], [place] [address]  °[facility] • Christian counseling 336-542-2060 °[address] °[place], [place] [address]  °[facility] ° • Offers crisis counseling, individual counseling, group therapy, in-home therapy, domestic violence services, day treatment, DWI services, Community Support Team (CST), Assertive Community Treatment Team (ACTT), substance abuse Intensive Outpatient Program (several hours a day, several times a week) °• 2 locations - [place] and [place] [phone] °[address] °[place], [place] [address] ° °336-694-1777 °[address] °[place], [place] [address]  °[facility]  ° ° • Individual counseling and group therapy °• Accepts private insurance, Medicare, Medicaid 336-379-7146 °[address] °[place], [place]  °[facility] • Offers individual and family counseling °• Offers LGBTQ services °• Accepts private insurance and private pay 336-288-9190 °[address] °[place], [place] [address]  °[facility]  ° • Offers individual counseling, group therapy, and outpatient detox °• Accepts private insurance 336-389-1413 °[address] °, [place]  °[facility] • Individual counseling °• Accepts Medicare, private insurance [phone] °[address] °, [place] [address]  °[facility] • Individual counseling °• Accepts Medicare, private insurance [phone] °[address] °[place],  [address]  °[facility] ° • Offers substance abuse  Intensive Outpatient Program (several hours a day, several times a week) [phone], or °[phone] °,   ° °

## 2015-12-06 NOTE — ED Provider Notes (Signed)
Pt cleared by TTS for home.  Noemi Chapel, MD 12/06/15 2322

## 2015-12-06 NOTE — ED Notes (Signed)
Pt states caregiver stress from taking care of her mother. States no family willing to help. Pt can be calmed by talking to her.

## 2015-12-09 DIAGNOSIS — F419 Anxiety disorder, unspecified: Secondary | ICD-10-CM | POA: Diagnosis not present

## 2015-12-09 DIAGNOSIS — C50411 Malignant neoplasm of upper-outer quadrant of right female breast: Secondary | ICD-10-CM | POA: Diagnosis not present

## 2015-12-09 DIAGNOSIS — F329 Major depressive disorder, single episode, unspecified: Secondary | ICD-10-CM | POA: Diagnosis not present

## 2016-03-01 DIAGNOSIS — M545 Low back pain: Secondary | ICD-10-CM | POA: Diagnosis not present

## 2016-03-01 DIAGNOSIS — G894 Chronic pain syndrome: Secondary | ICD-10-CM | POA: Diagnosis not present

## 2016-03-01 DIAGNOSIS — Z6822 Body mass index (BMI) 22.0-22.9, adult: Secondary | ICD-10-CM | POA: Diagnosis not present

## 2016-03-01 DIAGNOSIS — N951 Menopausal and female climacteric states: Secondary | ICD-10-CM | POA: Diagnosis not present

## 2016-03-01 DIAGNOSIS — C50919 Malignant neoplasm of unspecified site of unspecified female breast: Secondary | ICD-10-CM | POA: Diagnosis not present

## 2016-03-01 DIAGNOSIS — Z1389 Encounter for screening for other disorder: Secondary | ICD-10-CM | POA: Diagnosis not present

## 2016-04-21 DIAGNOSIS — L602 Onychogryphosis: Secondary | ICD-10-CM | POA: Diagnosis not present

## 2016-04-21 DIAGNOSIS — L603 Nail dystrophy: Secondary | ICD-10-CM | POA: Diagnosis not present

## 2016-04-21 DIAGNOSIS — R238 Other skin changes: Secondary | ICD-10-CM | POA: Diagnosis not present

## 2016-05-25 DIAGNOSIS — Z23 Encounter for immunization: Secondary | ICD-10-CM | POA: Diagnosis not present

## 2016-09-30 DIAGNOSIS — Z6824 Body mass index (BMI) 24.0-24.9, adult: Secondary | ICD-10-CM | POA: Diagnosis not present

## 2016-09-30 DIAGNOSIS — G894 Chronic pain syndrome: Secondary | ICD-10-CM | POA: Diagnosis not present

## 2016-09-30 DIAGNOSIS — Z1389 Encounter for screening for other disorder: Secondary | ICD-10-CM | POA: Diagnosis not present

## 2016-09-30 DIAGNOSIS — F329 Major depressive disorder, single episode, unspecified: Secondary | ICD-10-CM | POA: Diagnosis not present

## 2016-09-30 DIAGNOSIS — F419 Anxiety disorder, unspecified: Secondary | ICD-10-CM | POA: Diagnosis not present

## 2016-09-30 DIAGNOSIS — M5412 Radiculopathy, cervical region: Secondary | ICD-10-CM | POA: Diagnosis not present

## 2016-11-09 DIAGNOSIS — Z6823 Body mass index (BMI) 23.0-23.9, adult: Secondary | ICD-10-CM | POA: Diagnosis not present

## 2016-11-09 DIAGNOSIS — M791 Myalgia: Secondary | ICD-10-CM | POA: Diagnosis not present

## 2016-11-09 DIAGNOSIS — G894 Chronic pain syndrome: Secondary | ICD-10-CM | POA: Diagnosis not present

## 2016-11-09 DIAGNOSIS — I10 Essential (primary) hypertension: Secondary | ICD-10-CM | POA: Diagnosis not present

## 2016-11-09 DIAGNOSIS — F419 Anxiety disorder, unspecified: Secondary | ICD-10-CM | POA: Diagnosis not present

## 2016-11-09 DIAGNOSIS — R5383 Other fatigue: Secondary | ICD-10-CM | POA: Diagnosis not present

## 2016-11-09 DIAGNOSIS — R413 Other amnesia: Secondary | ICD-10-CM | POA: Diagnosis not present

## 2016-11-09 DIAGNOSIS — Z1389 Encounter for screening for other disorder: Secondary | ICD-10-CM | POA: Diagnosis not present

## 2016-11-22 ENCOUNTER — Encounter: Payer: Self-pay | Admitting: Neurology

## 2016-11-23 ENCOUNTER — Ambulatory Visit: Payer: BLUE CROSS/BLUE SHIELD | Admitting: Obstetrics and Gynecology

## 2016-12-15 ENCOUNTER — Telehealth: Payer: Self-pay | Admitting: *Deleted

## 2016-12-15 NOTE — Telephone Encounter (Signed)
Spoke with pt. Pt states her cancer doc went out of business last year. She was diagnosed with breast cancer in 2014. She needs a referral for mammogram. Pt was wondering if Dr. Glo Herring would do referral. Pt hasn't seen Dr. Glo Herring since 2016. I advised she would need to see Dr. Glo Herring. Pt voiced understanding and call was transferred to front desk for appt. Emm[place]

## 2016-12-21 ENCOUNTER — Other Ambulatory Visit (HOSPITAL_COMMUNITY)
Admission: RE | Admit: 2016-12-21 | Discharge: 2016-12-21 | Disposition: A | Discharge status: Home / Self Care | Payer: BLUE CROSS/BLUE SHIELD | Source: Ambulatory Visit | Attending: Obstetrics and Gynecology | Admitting: Obstetrics and Gynecology

## 2016-12-21 ENCOUNTER — Ambulatory Visit (INDEPENDENT_AMBULATORY_CARE_PROVIDER_SITE_OTHER): Payer: BLUE CROSS/BLUE SHIELD | Admitting: Obstetrics and Gynecology

## 2016-12-21 ENCOUNTER — Encounter: Payer: Self-pay | Admitting: Obstetrics and Gynecology

## 2016-12-21 VITALS — BP 118/88 | HR 80 | Ht 67.25 in | Wt 152.4 lb

## 2016-12-21 DIAGNOSIS — D0511 Intraductal carcinoma in situ of right breast: Secondary | ICD-10-CM

## 2016-12-21 DIAGNOSIS — Z01419 Encounter for gynecological examination (general) (routine) without abnormal findings: Secondary | ICD-10-CM | POA: Insufficient documentation

## 2016-12-21 NOTE — Progress Notes (Signed)
Assessment:  Annual Gyn Exam Hx of stage 1 breast cancer, right, needs an oncologist   Plan:  1. pap smear done, next pap due 3 years  2. return annually or prn 3    [facility] Subjective:  [name] is a 56 y.o. female No obstetric history on file. who presents for annual exam. Patient's last menstrual period was 12/06/2013. The patient has no complaints today. She needs follow up for yearly mammograms as Her oncology clinic in [place] has closed..   The following portions of the patient's history were reviewed and updated as appropriate: allergies, current medications, past family history, past medical history, past social history, past surgical history and problem list. Past Medical History:  Diagnosis Date  . Anemia   . Arthritis   . Cancer (HCC)    breast  . Chronic back pain   . Chronic neck pain   . Depression   . Dysrhythmia    hx palpitations-too much caffiene  . Headache   . Wears glasses     Past Surgical History:  Procedure Laterality Date  . ANKLE SURGERY     left ankle cyst  . BACK SURGERY  2000   lumbar disc surgery  . BREAST SURGERY     left partail mastectomy-cyst  . CERVICAL DISC SURGERY  1999   anterior and a posterier cerv fusionx2  . CESAREAN SECTION     x2  . COLONOSCOPY    . DILATION AND CURETTAGE OF UTERUS    . DILITATION & CURRETTAGE/HYSTROSCOPY WITH THERMACHOICE ABLATION  07/10/2012   Procedure: DILATATION & CURETTAGE/HYSTEROSCOPY WITH THERMACHOICE ABLATION;  Surgeon: [name], MD;  Location: [facility];  Service: Gynecology;  Laterality: N/A;  D5 65ml in, 13 ml out, temp 87degree celcius, total therapy time 23min 16sec  . ERCP    . TUBAL LIGATION     with last c-section     Current Outpatient Prescriptions:  .  ALPRAZolam (XANAX) 0.5 MG tablet, Take 0.5 mg by mouth 3 (three) times daily as needed. For anxiety, Disp: , Rfl:  .  Cholecalciferol (VITAMIN D PO), Take by mouth., Disp: , Rfl:  .   cyclobenzaprine (FLEXERIL) 10 MG tablet, Take 10 mg by mouth 3 (three) times daily as needed. For muscle spasms, Disp: , Rfl:  .  HYDROcodone-acetaminophen (NORCO) 10-325 MG per tablet, Take 1 tablet by mouth every 6 (six) hours as needed (pain)., Disp: , Rfl:  .  ibuprofen (ADVIL,MOTRIN) 200 MG tablet, Take 400-600 mg by mouth 2 (two) times daily., Disp: , Rfl:  .  Multiple Vitamins-Minerals (MULTIVITAMIN WITH MINERALS) tablet, Take 1 tablet by mouth daily., Disp: , Rfl:  .  escitalopram (LEXAPRO) 20 MG tablet, Take 20 mg by mouth daily., Disp: , Rfl:   Review of Systems Constitutional: negative Gastrointestinal: negative Genitourinary: negative  Objective:  BP 118/88 (BP Location: Left Arm, Patient Position: Sitting, Cuff Size: Normal)   Pulse 80   Ht 5' 7.25" (1.708 m)   Wt 152 lb 6.4 oz (69.1 kg)   LMP 12/06/2013 Comment: pt states she had a surgery and they inserted a balloon to slow her periods down. she has a light period every month  BMI 23.69 kg/m    BMI: Body mass index is 23.69 kg/m.  General Appearance: Alert, appropriate appearance for age. No acute distress HEENT: Grossly normal Neck / Thyroid:  Cardiovascular: RRR; normal S1, S2, no murmur Lungs: CTA bilaterally Back: No CVAT Breast Exam:  2 scars on left breast from benign biopsies 3 scars on right breast from excisional biopsy for breast cancer Gastrointestinal: Soft, non-tender, no masses or organomegaly Pelvic Exam: External genitalia: normal general appearance Vaginal: atrophic vaginal tissues without prolapse or lesions and normal without tenderness, induration or masses Cervix: normal appearance, good support Adnexa: normal bimanual exam Uterus: normal single, nontender, minimally enlarged due to residual fibroids Rectovaginal: normal rectal, no masses and guaiac negative stool obtained Lymphatic Exam: Non-palpable nodes in neck, clavicular, axillary, or inguinal regions  Skin: no rash or  abnormalities Neurologic: Normal gait and speech, no tremor  Psychiatric: Alert and oriented, appropriate affect.  Urinalysis:Not done  Mallory Shirk. MD Pgr [phone] 3:30 PM    By signing my name below, I, Sonum Patel, attest that this documentation has been prepared under the direction and in the presence of [name], MD. Electronically Signed: Sonum Patel, Education administrator. 12/21/16. 3:30 PM.  I personally performed the services described in this documentation, which was SCRIBED in my presence. The recorded information has been reviewed and considered accurate. It has been edited as necessary during review. [name], MD

## 2016-12-21 NOTE — Patient Instructions (Addendum)
Vitiligo  Please make appointment at the [facility] speacialty clinic [phone]

## 2016-12-23 LAB — CYTOLOGY - PAP
Adequacy: ABSENT
Diagnosis: NEGATIVE
HPV (WINDOPATH): NOT DETECTED

## 2016-12-26 ENCOUNTER — Other Ambulatory Visit: Payer: Self-pay | Admitting: Obstetrics and Gynecology

## 2016-12-26 DIAGNOSIS — Z9889 Other specified postprocedural states: Secondary | ICD-10-CM

## 2017-01-09 ENCOUNTER — Ambulatory Visit (HOSPITAL_COMMUNITY)
Admission: RE | Admit: 2017-01-09 | Discharge: 2017-01-09 | Disposition: A | Discharge status: Home / Self Care | Payer: BLUE CROSS/BLUE SHIELD | Source: Ambulatory Visit | Attending: Internal Medicine | Admitting: Internal Medicine

## 2017-01-09 ENCOUNTER — Other Ambulatory Visit (HOSPITAL_COMMUNITY): Payer: Self-pay | Admitting: Internal Medicine

## 2017-01-09 DIAGNOSIS — M542 Cervicalgia: Secondary | ICD-10-CM | POA: Diagnosis not present

## 2017-01-09 DIAGNOSIS — M5412 Radiculopathy, cervical region: Secondary | ICD-10-CM | POA: Diagnosis not present

## 2017-01-09 DIAGNOSIS — Z681 Body mass index (BMI) 19 or less, adult: Secondary | ICD-10-CM | POA: Diagnosis not present

## 2017-01-09 DIAGNOSIS — C50211 Malignant neoplasm of upper-inner quadrant of right female breast: Secondary | ICD-10-CM | POA: Diagnosis not present

## 2017-01-09 DIAGNOSIS — G894 Chronic pain syndrome: Secondary | ICD-10-CM | POA: Diagnosis not present

## 2017-01-11 ENCOUNTER — Encounter (HOSPITAL_COMMUNITY): Payer: Medicare Other

## 2017-01-17 ENCOUNTER — Encounter (HOSPITAL_COMMUNITY): Payer: Medicare Other

## 2017-01-24 ENCOUNTER — Ambulatory Visit: Payer: Medicare Other | Admitting: Neurology

## 2017-02-20 DIAGNOSIS — M50223 Other cervical disc displacement at C6-C7 level: Secondary | ICD-10-CM | POA: Diagnosis not present

## 2017-02-20 DIAGNOSIS — M5412 Radiculopathy, cervical region: Secondary | ICD-10-CM | POA: Diagnosis not present

## 2017-02-20 DIAGNOSIS — M542 Cervicalgia: Secondary | ICD-10-CM | POA: Diagnosis not present

## 2017-02-20 DIAGNOSIS — R030 Elevated blood-pressure reading, without diagnosis of hypertension: Secondary | ICD-10-CM | POA: Diagnosis not present

## 2017-02-20 DIAGNOSIS — M5020 Other cervical disc displacement, unspecified cervical region: Secondary | ICD-10-CM | POA: Insufficient documentation

## 2017-03-07 DIAGNOSIS — M50223 Other cervical disc displacement at C6-C7 level: Secondary | ICD-10-CM | POA: Diagnosis not present

## 2017-03-07 DIAGNOSIS — M542 Cervicalgia: Secondary | ICD-10-CM | POA: Diagnosis not present

## 2017-03-09 DIAGNOSIS — R030 Elevated blood-pressure reading, without diagnosis of hypertension: Secondary | ICD-10-CM | POA: Diagnosis not present

## 2017-03-09 DIAGNOSIS — M542 Cervicalgia: Secondary | ICD-10-CM | POA: Diagnosis not present

## 2017-03-09 DIAGNOSIS — M5412 Radiculopathy, cervical region: Secondary | ICD-10-CM | POA: Diagnosis not present

## 2017-03-16 DIAGNOSIS — M5412 Radiculopathy, cervical region: Secondary | ICD-10-CM | POA: Diagnosis not present

## 2017-04-06 DIAGNOSIS — M5412 Radiculopathy, cervical region: Secondary | ICD-10-CM | POA: Diagnosis not present

## 2017-04-11 DIAGNOSIS — M5412 Radiculopathy, cervical region: Secondary | ICD-10-CM | POA: Diagnosis not present

## 2017-04-11 DIAGNOSIS — F419 Anxiety disorder, unspecified: Secondary | ICD-10-CM | POA: Diagnosis not present

## 2017-04-11 DIAGNOSIS — C50211 Malignant neoplasm of upper-inner quadrant of right female breast: Secondary | ICD-10-CM | POA: Diagnosis not present

## 2017-04-11 DIAGNOSIS — Z6823 Body mass index (BMI) 23.0-23.9, adult: Secondary | ICD-10-CM | POA: Diagnosis not present

## 2017-04-11 DIAGNOSIS — I10 Essential (primary) hypertension: Secondary | ICD-10-CM | POA: Diagnosis not present

## 2017-04-11 DIAGNOSIS — Z1389 Encounter for screening for other disorder: Secondary | ICD-10-CM | POA: Diagnosis not present

## 2017-04-11 DIAGNOSIS — G894 Chronic pain syndrome: Secondary | ICD-10-CM | POA: Diagnosis not present

## 2017-04-18 ENCOUNTER — Ambulatory Visit (HOSPITAL_COMMUNITY)
Admission: RE | Admit: 2017-04-18 | Discharge: 2017-04-18 | Disposition: A | Discharge status: Home / Self Care | Payer: BLUE CROSS/BLUE SHIELD | Source: Ambulatory Visit | Attending: Obstetrics and Gynecology | Admitting: Obstetrics and Gynecology

## 2017-04-18 DIAGNOSIS — D0511 Intraductal carcinoma in situ of right breast: Secondary | ICD-10-CM | POA: Diagnosis not present

## 2017-04-18 DIAGNOSIS — R922 Inconclusive mammogram: Secondary | ICD-10-CM | POA: Diagnosis not present

## 2017-04-27 DIAGNOSIS — M5412 Radiculopathy, cervical region: Secondary | ICD-10-CM | POA: Diagnosis not present

## 2017-04-27 DIAGNOSIS — M542 Cervicalgia: Secondary | ICD-10-CM | POA: Diagnosis not present

## 2017-06-20 DIAGNOSIS — Z23 Encounter for immunization: Secondary | ICD-10-CM | POA: Diagnosis not present

## 2017-06-22 DIAGNOSIS — N342 Other urethritis: Secondary | ICD-10-CM | POA: Diagnosis not present

## 2017-06-22 DIAGNOSIS — Z6823 Body mass index (BMI) 23.0-23.9, adult: Secondary | ICD-10-CM | POA: Diagnosis not present

## 2017-06-22 DIAGNOSIS — Z1389 Encounter for screening for other disorder: Secondary | ICD-10-CM | POA: Diagnosis not present

## 2017-06-22 DIAGNOSIS — Z79891 Long term (current) use of opiate analgesic: Secondary | ICD-10-CM | POA: Diagnosis not present

## 2017-06-22 DIAGNOSIS — G8929 Other chronic pain: Secondary | ICD-10-CM | POA: Diagnosis not present

## 2017-06-22 DIAGNOSIS — F419 Anxiety disorder, unspecified: Secondary | ICD-10-CM | POA: Diagnosis not present

## 2017-07-20 ENCOUNTER — Ambulatory Visit (HOSPITAL_COMMUNITY)
Admission: RE | Admit: 2017-07-20 | Discharge: 2017-07-20 | Disposition: A | Discharge status: Home / Self Care | Payer: BLUE CROSS/BLUE SHIELD | Source: Ambulatory Visit | Attending: Family Medicine | Admitting: Family Medicine

## 2017-07-20 ENCOUNTER — Other Ambulatory Visit (HOSPITAL_COMMUNITY): Payer: Self-pay | Admitting: Family Medicine

## 2017-07-20 DIAGNOSIS — Z1389 Encounter for screening for other disorder: Secondary | ICD-10-CM | POA: Diagnosis not present

## 2017-07-20 DIAGNOSIS — F419 Anxiety disorder, unspecified: Secondary | ICD-10-CM | POA: Diagnosis not present

## 2017-07-20 DIAGNOSIS — M545 Low back pain: Secondary | ICD-10-CM

## 2017-07-20 DIAGNOSIS — Z6824 Body mass index (BMI) 24.0-24.9, adult: Secondary | ICD-10-CM | POA: Diagnosis not present

## 2017-07-22 DIAGNOSIS — Z1211 Encounter for screening for malignant neoplasm of colon: Secondary | ICD-10-CM | POA: Diagnosis not present

## 2017-08-01 DIAGNOSIS — Z6824 Body mass index (BMI) 24.0-24.9, adult: Secondary | ICD-10-CM | POA: Diagnosis not present

## 2017-08-01 DIAGNOSIS — F329 Major depressive disorder, single episode, unspecified: Secondary | ICD-10-CM | POA: Diagnosis not present

## 2017-08-01 DIAGNOSIS — M545 Low back pain: Secondary | ICD-10-CM | POA: Diagnosis not present

## 2017-08-01 DIAGNOSIS — F419 Anxiety disorder, unspecified: Secondary | ICD-10-CM | POA: Diagnosis not present

## 2017-08-03 ENCOUNTER — Ambulatory Visit: Payer: BLUE CROSS/BLUE SHIELD | Admitting: Obstetrics and Gynecology

## 2017-08-11 ENCOUNTER — Other Ambulatory Visit (HOSPITAL_COMMUNITY): Payer: BLUE CROSS/BLUE SHIELD

## 2017-08-11 ENCOUNTER — Encounter (HOSPITAL_COMMUNITY): Payer: Self-pay

## 2017-08-11 ENCOUNTER — Encounter (HOSPITAL_COMMUNITY): Payer: BLUE CROSS/BLUE SHIELD | Attending: Oncology | Admitting: Oncology

## 2017-08-11 VITALS — BP 131/80 | HR 97 | Temp 97.6°F | Resp 18 | Ht 66.0 in | Wt 162.0 lb

## 2017-08-11 DIAGNOSIS — Z170 Estrogen receptor positive status [ER+]: Secondary | ICD-10-CM

## 2017-08-11 DIAGNOSIS — C50911 Malignant neoplasm of unspecified site of right female breast: Secondary | ICD-10-CM | POA: Diagnosis not present

## 2017-08-11 DIAGNOSIS — M545 Low back pain: Secondary | ICD-10-CM

## 2017-08-11 DIAGNOSIS — C50211 Malignant neoplasm of upper-inner quadrant of right female breast: Secondary | ICD-10-CM

## 2017-08-11 NOTE — Progress Notes (Signed)
[facility] Initial Visit:  Patient Care Team: Redmond [employer], MD as PCP - General (Internal Medicine)  CHIEF COMPLAINTS/PURPOSE OF CONSULTATION:  Stage IA (pT1c pN0) ER/PR positive, HER2 negative right breast invasive ductal carcinoma with DCIS.  HISTORY OF PRESENTING ILLNESS: [name] 56 y.o. female presents today for evaluation of Stage IA (pT1c pN0) right breast cancer.  Her breast cancer was initially diagnosed in 2015.  She underwent a right lumpectomy on 10/09/2013 by Dr. Donne Hazel with sentinel lymph node biopsy.  Surgical path demonstrated invasive ductal carcinoma with DCIS, 0 out of 2 sentinel lymph nodes were positive for malignancy.  Final surgical path waspT1c pN0, ER/PR positive, HER-2 negative.  She underwent adjuvant radiation and eating.  She was also following with an oncologist to [facility] and was placed on adjuvant endocrine therapy.  Patient states she tried multiple different aromatase inhibitors, however they were causing her to have severe mood changes and suicidal ideations so she stopped taking it after 1 month.  She has not had a recurrence since then.  Her most recent bilateral diagnostic mammogram was on 04/18/2017 and demonstrated no evidence of new or recurrent breast cancer.  Today she states that overall she has been feeling well however she has been having a lot of issues with back pain.  She had a x-ray of her lumbar spine on 07/20/2017 which was negative for any fractures.  Otherwise she denies any weight loss, headache, chest pain, shortness of breath, abdominal pain, nausea, vomiting, diarrhea, dizziness, focal weakness.  Review of Systems - Oncology ROS as per HPI otherwise 12 point ROS is negative.  MEDICAL HISTORY: Past Medical History:  Diagnosis Date   Anemia    Arthritis    Cancer ([place])    breast   Chronic back pain    Chronic neck pain    Depression    Dysrhythmia    hx palpitations-too much caffiene   Headache     Wears glasses     SURGICAL HISTORY: Past Surgical History:  Procedure Laterality Date   ANKLE SURGERY     left ankle cyst   BACK SURGERY  2000   lumbar disc surgery   BREAST SURGERY     left partail mastectomy-cyst   CERVICAL DISC SURGERY  1999   anterior and a posterier cerv fusionx2   CESAREAN SECTION     x2   COLONOSCOPY     DILATION AND CURETTAGE OF UTERUS     DILITATION & CURRETTAGE/HYSTROSCOPY WITH THERMACHOICE ABLATION  07/10/2012   Procedure: DILATATION & CURETTAGE/HYSTEROSCOPY WITH THERMACHOICE ABLATION;  Surgeon: [name], MD;  Location: [facility];  Service: Gynecology;  Laterality: N/A;  D5 21m in, 13 ml out, temp 87degree celcius, total therapy time 943m 16sec   ERCP     TUBAL LIGATION     with last c-section    SOCIAL HISTORY: Social History   Socioeconomic History   Marital status: Married    Spouse name: Not on file   Number of children: Not on file   Years of education: Not on file   Highest education level: Not on file  Social Needs   Financial resource strain: Not on file   Food insecurity - worry: Not on file   Food insecurity - inability: Not on file   Transportation needs - medical: Not on file   Transportation needs - non-medical: Not on file  Occupational History   Not on file  Tobacco Use   Smoking status: Never Smoker  Smokeless tobacco: Never Used  Substance and Sexual Activity   Alcohol use: No   Drug use: No   Sexual activity: Yes    Birth control/protection: Surgical, Post-menopausal  Other Topics Concern   Not on file  Social History Narrative   Not on file    FAMILY HISTORY Family History  Problem Relation Age of Onset   Hypertension Mother     ALLERGIES:  has No Known Allergies.  MEDICATIONS:  Current Outpatient Medications  Medication Sig Dispense Refill   ALPRAZolam (XANAX) 0.5 MG tablet Take 0.5 mg by mouth 3 (three) times daily as needed. For anxiety     cyclobenzaprine  (FLEXERIL) 10 MG tablet Take 10 mg by mouth 3 (three) times daily as needed. For muscle spasms     escitalopram (LEXAPRO) 20 MG tablet Take 20 mg by mouth daily.     HYDROcodone-acetaminophen (NORCO) 10-325 MG tablet Take by mouth.     ibuprofen (ADVIL,MOTRIN) 200 MG tablet Take 400-600 mg by mouth 2 (two) times daily.     Multiple Vitamin (THERA) TABS Take by mouth.     nabumetone (RELAFEN) 500 MG tablet      venlafaxine XR (EFFEXOR-XR) 150 MG 24 hr capsule      Cholecalciferol (VITAMIN D PO) Take by mouth.     No current facility-administered medications for this visit.     PHYSICAL EXAMINATION:  ECOG PERFORMANCE STATUS: 0 - Asymptomatic   Vitals:   08/11/17 0833  BP: 131/80  Pulse: 97  Resp: 18  Temp: 97.6 F (36.4 C)  SpO2: 98%    Filed Weights   08/11/17 0833  Weight: 162 lb (73.5 kg)     Physical Exam Constitutional: Well-developed, well-nourished, and in no distress.   HENT:  Head: Normocephalic and atraumatic.  Mouth/Throat: No oropharyngeal exudate. Mucosa moist. Eyes: Pupils are equal, round, and reactive to light. Conjunctivae are normal. No scleral icterus.  Neck: Normal range of motion. Neck supple. No JVD present.  Cardiovascular: Normal rate, regular rhythm and normal heart sounds.  Exam reveals no gallop and no friction rub.   No murmur heard. Pulmonary/Chest: Effort normal and breath sounds normal. No respiratory distress. No wheezes.No rales.  Abdominal: Soft. Bowel sounds are normal. No distension. There is no tenderness. There is no guarding.  Musculoskeletal: No edema or tenderness.  Lymphadenopathy:    No cervical or supraclavicular adenopathy.  Neurological: Alert and oriented to person, place, and time. No cranial nerve deficit.  Skin: Skin is warm and dry. No rash noted. No erythema. No pallor.  Psychiatric: Affect and judgment normal.   Breast: No masses, nipple discharge, or axillary lymphadenopathy palpated on her left breast. Right  breast with no masses, nipple discharge, or axillary lymphadenopathy well healed surgical scar.  LABORATORY DATA: I have personally reviewed the data as listed:  No visits with results within 1 Month(s) from this visit.  Latest known visit with results is:  Office Visit on 12/21/2016  Component Date Value Ref Range Status   Adequacy 12/21/2016 Satisfactory for evaluation  endocervical/transformation zone component ABSENT.   Final   Diagnosis 12/21/2016 NEGATIVE FOR INTRAEPITHELIAL LESIONS OR MALIGNANCY.   Final   HPV 12/21/2016 NOT DETECTED   Final   Normal Reference Range - NOT Detected   Material Submitted 12/21/2016 CervicoVaginal Pap [ThinPrep Imaged]   Final   CYTOLOGY - PAP 12/21/2016 PAP RESULT   Final-Edited    RADIOGRAPHIC STUDIES: I have personally reviewed the radiological images as listed and agree with the  findings in the report Patient: Bovard, Latrecia E Collected: 10/09/2013 Client: [facility] Accession: [id number] Received: 10/09/2013 Rolm Bookbinder DOB: [birth year] Age: 57 Gender: F Reported: 10/11/2013 [address] Patient Ph: [phone] MRN #: [id number] [place], [place] [id number] Visit #: [id number] Chart #: Phone:  Fax: CC: Lovey Newcomer, MD Ulyess Blossom, MD [employer] Mallory Shirk, MD Nolon Nations, MD Trudee Kuster, MD Donavan Burnet, MD Bary Castilla, RN REPORT OF SURGICAL PATHOLOGY ADDITIONAL INFORMATION: 1. CHROMOGENIC IN-SITU HYBRIDIZATION Results: HER-2/NEU BY CISH - NO AMPLIFICATION OF HER-2 DETECTED. RESULT RATIO OF HER2: CEP 17 SIGNALS 1.13 AVERAGE HER2 COPY NUMBER PER CELL 1.35 REFERENCE RANGE NEGATIVE HER2/Chr17 Ratio <2.0 and Average HER2 copy number <4.0 EQUIVOCAL HER2/Chr17 Ratio <2.0 and Average HER2 copy number 4.0 and <6.0 POSITIVE HER2/Chr17 Ratio >=2.0 and/or Average HER2 copy number >=6.0 Enid Cutter MD Pathologist, Electronic Signature ( Signed 10/15/2013) FINAL DIAGNOSIS Diagnosis 1. Breast,  lumpectomy, Right - INVASIVE DUCTAL CARCINOMA, SEE COMMENT. - NEGATIVE FOR LYMPHOVASCULAR INVASION. - INVASIVE TUMOR INVOLVES CAUTERIZED TISSUE AT INFERIOR MARGIN. - INVASIVE TUMOR IS 1 MM FROM NEAREST POSTERIOR MARGIN. - DUCTAL CARCINOMA IN SITU. - SEE TUMOR TEMPLATE BELOW. 2. Lymph node, sentinel, biopsy, Right axillary #1 - 1 LYMPH NODE, NEGATIVE FOR TUMOR (0/1). 3. Lymph node, sentinel, biopsy, Right axillary #2 - 1 LYMPH NODE, NEGATIVE FOR TUMOR (0/1). 4. Breast, excision, Right posterior margin 1 of 4 FINAL for Pasko, Marylan E ([id number]) Diagnosis(continued) - BENIGN BREAST TISSUE, SEE COMMENT. - NEGATIVE FOR ATYPIA OR MALIGNANCY. - SURGICAL MARGIN NEGATIVE FOR ATYPIA OR MALIGNANCY. 5. Breast, excision, Right inferior margin - BENIGN BREAST TISSUE, SEE COMMENT. - NEGATIVE FOR ATYPIA OR MALIGNANCY. - MICROCALCIFICATIONS IDENTIFIED. Microscopic Comment 1. BREAST, INVASIVE TUMOR, WITH LYMPH NODE SAMPLING Specimen, including laterality and lymph node sampling (sentinel, non-sentinel): Right breast Procedure: Lumpectomy and lymph node sampling Histologic type: Ductal Grade: I of III Tubule formation: I Nuclear pleomorphism: I Mitotic: I Tumor size (gross measurement): 1.5 cm Margins: Invasive, distance to closest margin: Involves cauterized inferior margin, 1 mm from the nearest posterior margin In-situ, distance to closest margin: 0.4 cm (inferior / posterior) If margin positive, focally or broadly: See comment Lymphovascular invasion: Absent Ductal carcinoma in situ: Present Grade: I of III Extensive intraductal component: Absent Lobular neoplasia: Unifocal Tumor focality: None Treatment effect: N/A If present, treatment effect in breast tissue, lymph nodes or both: N/A Extent of tumor: Skin: N/A Nipple: N/A Skeletal muscle: N/A Lymph nodes: Examined: 2 Sentinel 0 Non-sentinel 2 Total Lymph nodes with metastasis: 0 Breast prognostic profile: Estrogen receptor:  Not repeated, previous study demonstrated 99% positivity (SAY30-1) Progesterone receptor: Not repeated, previous study demonstrated 56% positivity (SWF09-3) Her 2 neu: Repeated, previous study demonstrated no amplification (1.07) (SZA15-1) Ki-67: Not represented, previous study demonstrated 13% proliferative rate (ATF57-3) Non-neoplastic breast: Fibrocystic change, microcalcifications and benign lobules TNM: pT1c, pN0, pMX Comments: The final posterior and inferior margins are parts 4 and 5 respectively. 4. There is no mass grossly identified. Representative sections demonstrate benign fibrovascular and adipose mammary soft tissue. There is incidental benign skeletal muscle present. There are no features of atypia or malignancy present. 5. There is no mass grossly identified. Representative sections demonstrate non-neoplastic findings to include fibrocystic change. Microcalcifications are present in benign lobules. There are no features of atypia or 2 of 4 FINAL for Broman, Norell E ([id number]) Microscopic Comment(continued) malignancy present. The surgical resection margin(s) of the specimen were inked and microscopically evaluated. (CRR:caf 10/11/13) Mali RUND DO Pathologist, Electronic Signature (Case  signed 10/11/2013) Intraoperative Diagnosis RAPID INTRAOPERATIVE CONSULT, RIGHT BREAST LUMPECTOMY, MARGINS BY GROSS EXAMINATION - DEFER TO PERMANENT. FIBROUS TISSUE PRESENT AT ANTERIOR AND INFERIOR / POSTERIOR MARGINS. (CRR) Specimen Gross and Clinical Information Specimen(s) Obtained: 1. Breast, lumpectomy, Right 2. Lymph node, sentinel, biopsy, Right axillary #1 3. Lymph node, sentinel, biopsy, Right axillary #2 4. Breast, excision, Right posterior margin 5. Breast, excision, Right inferior margin Specimen Clinical Information 1. Right breast cancer (tl) Gross 1. Specimen type: Right breast lumpectomy. In formalin at 1209 hours. Size: 7 x 5 cm and ranges from 1.8 to 3.2 cm  thick. Orientation: The specimen is focally disrupted over anterior margin at the localization needle. The specimen is previously inked as follows: green anterior, blue inferior, orange lateral, yellow medial, black posterior, red superior. Localized area: There is an inserted wire and the yellow localizing needle identifies a clip n specimen radiograph. Cut surface: On sectioning through the area with the green pin a radioactive seed is identified and removed and sent to Nuclear Medicine. On sectioning through the yellow localizing needle there is a 1.5 x 1 x 1 cm gray white to yellow red firm ill defined mass with an embedded clip. Margins: The mss is 0.4 cm from the juncture of the inferior and posterior margin with gray white fibrous tissue trailing from the mass, and abutting inferior and posterior margins. The mass is 0.6 cm from the anterior margin, with gray white fibrous tissue trailing from the mass and abutting the anterior margin. All other margins are greater than 1 cm. Prognostic indicators: Not taken at time of gross. Block summary: A - C = mass, entirely submitted, with nearest anterior, posterior and inferior margins D, E = additional anterior margin and fibrous tissue nearest mass F = additional posterior margin and fibrous tissue near mas G = additional inferior margin and fibrous tissue near mass H = superioir margin nearest mass I = medial margin nearest mass J = lateral margin nearest mass Total: ten blocks 2. Rapid Intraoperative Consult performed (Yes or No): No. Specimen: Right axillary sentinel node. Number and size: One node, 1.5 x 1.4 x 1 cm. Cut Surface(s): Yellow red, soft to rubbery, non-blue. Block Summary: Sectioned and entirely submitted in one block labeled SLN for routine histology. 3 of 4 FINAL for Rudden, Jennesis E ([id number]) Gross(continued) 3. Rapid Intraoperative Consult performed (Yes or No): No. Specimen: Right axillary sentinel node. Number and  size: One node, 1.9 x 1.4 x 0.8 cm. Cut Surface(s): Tan red, soft to rubbery, non-blue. Block Summary: Sectioned and entirely submitted in one block labeled SLN for routine histology. 4. Received fresh is a 2.2 x 1.5 cm portion of fibrofatty tissue which is up to 0.6 cm thick, and identified as additional posterior margin of right breast. There is a double suture at deep (new posterior margin), which is inked black at gross. There is also a short suture at superior edge and long suture at lateral edge. On sectioning, there is predominantly soft fatty tissue with minimal fibrous tissue. Entirely submitted as follows: A = sections at superior B = sections at inferior C = sections at medial D = sections at lateral Total: four blocks 5. Received fresh is a 3.8 x 3.5 cm portion of fibrofatty tissue which is up to 1 cm thick, and clinically identities as additional inferior margin of right breast. There is a short suture at superior edge, long suture at lateral edge and double suture at deep edge. The superior and deep sutures are  on directly opposite surfaces. The presumed new inferior surgical margin is inked black. On sectioning, there is predominantly soft fatty tissue with a small amount of gray white to hyperemic soft fibrous tissue. A discrete lesion or mass is not identified. Entirely submitted as follows: A = sections at superior B = sections at deep C, D = sections at medial E, F = sections at lateral Total: six blocks. (SW:caf 10/10/13) Disclaimer Her2Neu by CISH (chromogenic in-situ hybridization) is performed at [facility], using the [place] pharmDx Kit (code number [phone]) Report signed out from the following location(s) Technical Component and Interpretation performed at [facility], [place], [place] [address]. CLIA #: [id number],     ASSESSMENT/PLAN Stage IA (pT1c pN0) ER/PR positive, HER2 negative right breast invasive ductal  carcinoma with DCIS. S/p lumpectomy in February 2015 followed by adjuvant RT. Patient took multiple aromatase inhibitors for 1 month, but was intolerant to all of them due to severe mood changes with associated suicidal ideations.  PLAN: -clinically NED on breast exam today. -Continue bilateral diagnostic mammograms with TOMO, next one due in August 2019. -RTC in 6 months for follow up with labs.  Orders Placed This Encounter  Procedures   CBC with Differential    Standing Status:   Future    Standing Expiration Date:   08/11/2018   Comprehensive metabolic panel    Standing Status:   Future    Standing Expiration Date:   08/11/2018      All questions were answered. The patient knows to call the clinic with any problems, questions or concerns.  This note was electronically signed.    Twana First, MD  08/11/2017 8:46 AM

## 2017-08-11 NOTE — Patient Instructions (Signed)
[facility]  Discharge Instructions:  You were seen by DR. Zhou today _______________________________________________________________  Thank you for choosing [facility] at [facility] to provide your oncology and hematology care.  To afford each patient quality time with our providers, please arrive at least 15 minutes before your scheduled appointment.  You need to re-schedule your appointment if you arrive 10 or more minutes late.  We strive to give you quality time with our providers, and arriving late affects you and other patients whose appointments are after yours.  Also, if you no show three or more times for appointments you may be dismissed from the clinic.  Again, thank you for choosing [facility] at [facility]. Our hope is that these requests will allow you access to exceptional care and in a timely manner. _______________________________________________________________  If you have questions after your visit, please contact our office at [phone] between the hours of 8:30 a.m. and 5:00 p.m. Voicemails left after 4:30 p.m. will not be returned until the following business day. _______________________________________________________________  For prescription refill requests, have your pharmacy contact our office. _______________________________________________________________  Recommendations made by the consultant and any test results will be sent to your referring physician. _______________________________________________________________ 

## 2017-08-25 DIAGNOSIS — F419 Anxiety disorder, unspecified: Secondary | ICD-10-CM | POA: Diagnosis not present

## 2017-08-25 DIAGNOSIS — I10 Essential (primary) hypertension: Secondary | ICD-10-CM | POA: Diagnosis not present

## 2017-08-25 DIAGNOSIS — G894 Chronic pain syndrome: Secondary | ICD-10-CM | POA: Diagnosis not present

## 2017-08-25 DIAGNOSIS — R946 Abnormal results of thyroid function studies: Secondary | ICD-10-CM | POA: Diagnosis not present

## 2017-08-25 DIAGNOSIS — Z0001 Encounter for general adult medical examination with abnormal findings: Secondary | ICD-10-CM | POA: Diagnosis not present

## 2017-08-25 DIAGNOSIS — C50211 Malignant neoplasm of upper-inner quadrant of right female breast: Secondary | ICD-10-CM | POA: Diagnosis not present

## 2017-08-25 DIAGNOSIS — Z6824 Body mass index (BMI) 24.0-24.9, adult: Secondary | ICD-10-CM | POA: Diagnosis not present

## 2017-08-25 DIAGNOSIS — Z1389 Encounter for screening for other disorder: Secondary | ICD-10-CM | POA: Diagnosis not present

## 2017-08-25 DIAGNOSIS — E785 Hyperlipidemia, unspecified: Secondary | ICD-10-CM | POA: Diagnosis not present

## 2017-10-13 DIAGNOSIS — Z6823 Body mass index (BMI) 23.0-23.9, adult: Secondary | ICD-10-CM | POA: Diagnosis not present

## 2017-10-13 DIAGNOSIS — M25559 Pain in unspecified hip: Secondary | ICD-10-CM | POA: Diagnosis not present

## 2017-10-13 DIAGNOSIS — M545 Low back pain: Secondary | ICD-10-CM | POA: Diagnosis not present

## 2017-10-13 DIAGNOSIS — M25529 Pain in unspecified elbow: Secondary | ICD-10-CM | POA: Diagnosis not present

## 2017-10-13 DIAGNOSIS — Z1389 Encounter for screening for other disorder: Secondary | ICD-10-CM | POA: Diagnosis not present

## 2017-10-13 DIAGNOSIS — M25552 Pain in left hip: Secondary | ICD-10-CM | POA: Diagnosis not present

## 2017-10-13 DIAGNOSIS — M25521 Pain in right elbow: Secondary | ICD-10-CM | POA: Diagnosis not present

## 2017-10-26 DIAGNOSIS — E2839 Other primary ovarian failure: Secondary | ICD-10-CM | POA: Diagnosis not present

## 2017-10-26 DIAGNOSIS — Z780 Asymptomatic menopausal state: Secondary | ICD-10-CM | POA: Diagnosis not present

## 2017-11-24 ENCOUNTER — Other Ambulatory Visit (HOSPITAL_COMMUNITY): Payer: Self-pay | Admitting: Family Medicine

## 2017-11-24 ENCOUNTER — Ambulatory Visit (HOSPITAL_COMMUNITY)
Admission: RE | Admit: 2017-11-24 | Discharge: 2017-11-24 | Disposition: A | Discharge status: Home / Self Care | Payer: BLUE CROSS/BLUE SHIELD | Source: Ambulatory Visit | Attending: Family Medicine | Admitting: Family Medicine

## 2017-11-24 DIAGNOSIS — Z1389 Encounter for screening for other disorder: Secondary | ICD-10-CM

## 2017-11-24 DIAGNOSIS — M25521 Pain in right elbow: Secondary | ICD-10-CM | POA: Diagnosis not present

## 2017-11-24 DIAGNOSIS — Z6823 Body mass index (BMI) 23.0-23.9, adult: Secondary | ICD-10-CM | POA: Diagnosis not present

## 2018-01-16 ENCOUNTER — Ambulatory Visit (INDEPENDENT_AMBULATORY_CARE_PROVIDER_SITE_OTHER): Payer: BLUE CROSS/BLUE SHIELD | Admitting: Orthopedic Surgery

## 2018-01-16 ENCOUNTER — Encounter: Payer: Self-pay | Admitting: Orthopedic Surgery

## 2018-01-16 ENCOUNTER — Encounter

## 2018-01-16 VITALS — BP 109/72 | HR 104 | Ht 66.0 in | Wt 160.0 lb

## 2018-01-16 DIAGNOSIS — M7711 Lateral epicondylitis, right elbow: Secondary | ICD-10-CM | POA: Diagnosis not present

## 2018-01-16 NOTE — Patient Instructions (Addendum)
Apply ice 20 min 3 x a day  Tennis Elbow Tennis elbow is puffiness (inflammation) of the outer tendons of your forearm close to your elbow. Your tendons attach your muscles to your bones. Tennis elbow can happen in any sport or job in which you use your elbow too much. It is caused by doing the same motion over and over. Tennis elbow can cause:  Pain and tenderness in your forearm and the outer part of your elbow.  A burning feeling. This runs from your elbow through your arm.  Weak grip in your hands.  Follow these instructions at home: Activity  Rest your elbow and wrist as told by your doctor. Try to avoid any activities that caused the problem until your doctor says that you can do them again.  If a physical therapist teaches you exercises, do all of them as told.  If you lift an object, lift it with your palm facing up. This is easier on your elbow. Lifestyle  If your tennis elbow is caused by sports, check your equipment and make sure that: ? You are using it correctly. ? It fits you well.  If your tennis elbow is caused by work, take breaks often, if you are able. Talk with your manager about doing your work in a way that is safe for you. ? If your tennis elbow is caused by computer use, talk with your manager about any changes that can be made to your work setup. General instructions  If told, apply ice to the painful area: ? Put ice in a plastic bag. ? Place a towel between your skin and the bag. ? Leave the ice on for 20 minutes, 2-3 times per day.  Take medicines only as told by your doctor.  If you were given a brace, wear it as told by your doctor.  Keep all follow-up visits as told by your doctor. This is important. Contact a doctor if:  Your pain does not get better with treatment.  Your pain gets worse.  You have weakness in your forearm, hand, or fingers.  You cannot feel your forearm, hand, or fingers. This information is not intended to replace  advice given to you by your health care provider. Make sure you discuss any questions you have with your health care provider. Document Released: 02/09/2010 Document Revised: 04/21/2016 Document Reviewed: 08/18/2014 Elsevier Interactive Patient Education  [employer].

## 2018-01-16 NOTE — Progress Notes (Signed)
NEW PATIENT OFFICE VISIT   Chief Complaint  Patient presents with  . Elbow Pain    for 7 months ? injury in fall      [facility] ordered? no  My independent reading of xrays: I was able to read an x-ray of the right elbow 4 views done on March 22.  The [facility] lateral and oblique films show no fracture dislocation or arthritis  My impression is normal x-ray   No diagnosis found.   PLAN:  Super 7 right elbow 6 weeks ice right elbow 6 weeks Procedure note injection for right tennis elbow   Diagnosis right tennis elbow  Anesthesia ethyl chloride was used Alcohol use is clean the skin  After we obtained verbal consent and timeout a 25-gauge needle was used to inject 40 mg of Depo-Medrol and 3 cc of 1% lidocaine just distal to the insertion of the ECRB  There were no complications and a sterile bandage was applied.   No orders of the defined types were placed in this encounter.  Injection? yes MRI/CT/? no   Chief Complaint  Patient presents with  . Elbow Pain    for 7 months ? injury in fall     57 year old female presents for evaluation of painful right elbow  Patient started having pain several weeks after falling on her right arm with right lateral elbow pain associated with painful range of motion and difficulty picking up simple objects such as a pot pain or couple coffee   Review of Systems  Skin: Negative.   Neurological: Positive for focal weakness. Negative for tingling and weakness.       Focal weakness right wrist secondary to painful right elbow     Past Medical History:  Diagnosis Date  . Anemia   . Arthritis   . Cancer (HCC)    breast  . Chronic back pain   . Chronic neck pain   . Depression   . Dysrhythmia    hx palpitations-too much caffiene  . Headache   . Wears glasses     Past Surgical History:  Procedure Laterality Date  . ANKLE SURGERY     left ankle cyst  . BACK SURGERY  2000   lumbar disc surgery  .  BREAST SURGERY     left partail mastectomy-cyst  . CERVICAL DISC SURGERY  1999   anterior and a posterier cerv fusionx2  . CESAREAN SECTION     x2  . COLONOSCOPY    . DILATION AND CURETTAGE OF UTERUS    . DILITATION & CURRETTAGE/HYSTROSCOPY WITH THERMACHOICE ABLATION  07/10/2012   Procedure: DILATATION & CURETTAGE/HYSTEROSCOPY WITH THERMACHOICE ABLATION;  Surgeon: [name], MD;  Location: [facility];  Service: Gynecology;  Laterality: N/A;  D5 53ml in, 13 ml out, temp 87degree celcius, total therapy time 44min 16sec  . ERCP    . TUBAL LIGATION     with last c-section    Family History  Problem Relation Age of Onset  . Hypertension Mother    Social History   Tobacco Use  . Smoking status: Never Smoker  . Smokeless tobacco: Never Used  Substance Use Topics  . Alcohol use: No  . Drug use: No    @ALL @  Current Meds  Medication Sig  . ALPRAZolam (XANAX) 0.5 MG tablet Take 0.5 mg by mouth 3 (three) times daily as needed. For anxiety  . Cholecalciferol (VITAMIN D PO) Take by mouth.  . cyclobenzaprine (FLEXERIL) 10 MG tablet  Take 10 mg by mouth 3 (three) times daily as needed. For muscle spasms  . diclofenac sodium (VOLTAREN) 1 % GEL   . escitalopram (LEXAPRO) 20 MG tablet Take 20 mg by mouth daily.  Marland Kitchen HYDROcodone-acetaminophen (NORCO) 10-325 MG tablet Take by mouth.  Marland Kitchen ibuprofen (ADVIL,MOTRIN) 200 MG tablet Take 400 mg by mouth every 6 (six) hours as needed.  . Multiple Vitamin (THERA) TABS Take by mouth.  . venlafaxine XR (EFFEXOR-XR) 150 MG 24 hr capsule     BP 109/72   Pulse (!) 104   Ht 5\' 6"  (1.676 m)   Wt 160 lb (72.6 kg)   LMP 12/06/2013 Comment: pt states she had a surgery and they inserted a balloon to slow her periods down. she has a light period every month  BMI 25.82 kg/m   Physical Exam  Constitutional: She is oriented to person, place, and time. She appears well-developed and well-nourished.  Neurological: She is alert and oriented to person, place, and  time.  Psychiatric: She has a normal mood and affect. Judgment normal.  Vitals reviewed.   Right Elbow Exam   Tenderness  The patient is experiencing tenderness in the lateral epicondyle.   Range of Motion  The patient has normal right elbow ROM.  Muscle Strength  The patient has normal right elbow strength.  Tests  Varus: negative Valgus: negative  Other  Erythema: absent Scars: absent Sensation: normal Pulse: present  Comments:  Pain wrist extension against resistance with elbow extended pain with long finger extension resistance test   Left Elbow Exam   Tenderness  The patient is experiencing no tenderness.   Range of Motion  The patient has normal left elbow ROM.  Muscle Strength  The patient has normal left elbow strength.  Tests  Varus: negative Valgus: negative  Other  Erythema: absent Scars: absent Sensation: normal Pulse: present

## 2018-01-19 ENCOUNTER — Telehealth: Payer: Self-pay | Admitting: Orthopedic Surgery

## 2018-01-19 ENCOUNTER — Other Ambulatory Visit: Payer: Self-pay | Admitting: Orthopedic Surgery

## 2018-01-19 DIAGNOSIS — M7711 Lateral epicondylitis, right elbow: Secondary | ICD-10-CM

## 2018-01-19 MED ORDER — GABAPENTIN 100 MG PO CAPS: 100.0000 mg | ORAL_CAPSULE | Freq: Three times a day (TID) | ORAL | 2 refills | Status: DC

## 2018-01-19 MED ORDER — TRAMADOL-ACETAMINOPHEN 37.5-325 MG PO TABS: 1.0000 | ORAL_TABLET | ORAL | 5 refills | Status: DC | PRN

## 2018-01-19 NOTE — Telephone Encounter (Signed)
Patient states she had injection in her elbow the other day.  She is having a lot of pain.  She states she has tried regular Advil but this is not helping.  She said she needs something for pain called in to [facility]

## 2018-01-25 DIAGNOSIS — Z6822 Body mass index (BMI) 22.0-22.9, adult: Secondary | ICD-10-CM | POA: Diagnosis not present

## 2018-01-25 DIAGNOSIS — R350 Frequency of micturition: Secondary | ICD-10-CM | POA: Diagnosis not present

## 2018-01-25 DIAGNOSIS — Z1389 Encounter for screening for other disorder: Secondary | ICD-10-CM | POA: Diagnosis not present

## 2018-01-29 ENCOUNTER — Other Ambulatory Visit (HOSPITAL_COMMUNITY): Payer: Self-pay | Admitting: *Deleted

## 2018-01-29 DIAGNOSIS — C50419 Malignant neoplasm of upper-outer quadrant of unspecified female breast: Secondary | ICD-10-CM

## 2018-02-02 ENCOUNTER — Inpatient Hospital Stay (HOSPITAL_COMMUNITY): Payer: BLUE CROSS/BLUE SHIELD | Attending: Hematology

## 2018-02-02 DIAGNOSIS — Z853 Personal history of malignant neoplasm of breast: Secondary | ICD-10-CM | POA: Diagnosis not present

## 2018-02-02 DIAGNOSIS — C50419 Malignant neoplasm of upper-outer quadrant of unspecified female breast: Secondary | ICD-10-CM

## 2018-02-02 LAB — CBC WITH DIFFERENTIAL/PLATELET
BASOS ABS: 0.1 10*3/uL (ref 0.0–0.1)
Basophils Relative: 1 %
EOS ABS: 0 10*3/uL (ref 0.0–0.7)
EOS PCT: 1 %
HCT: 38.2 % (ref 36.0–46.0)
Hemoglobin: 12.4 g/dL (ref 12.0–15.0)
LYMPHS PCT: 31 %
Lymphs Abs: 1.3 10*3/uL (ref 0.7–4.0)
MCH: 31.1 pg (ref 26.0–34.0)
MCHC: 32.5 g/dL (ref 30.0–36.0)
MCV: 95.7 fL (ref 78.0–100.0)
Monocytes Absolute: 0.4 10*3/uL (ref 0.1–1.0)
Monocytes Relative: 9 %
Neutro Abs: 2.5 10*3/uL (ref 1.7–7.7)
Neutrophils Relative %: 58 %
Platelets: 254 10*3/uL (ref 150–400)
RBC: 3.99 MIL/uL (ref 3.87–5.11)
RDW: 13.9 % (ref 11.5–15.5)
WBC: 4.2 10*3/uL (ref 4.0–10.5)

## 2018-02-02 LAB — COMPREHENSIVE METABOLIC PANEL
ALT: 45 U/L (ref 14–54)
AST: 41 U/L (ref 15–41)
Albumin: 4.1 g/dL (ref 3.5–5.0)
Alkaline Phosphatase: 90 U/L (ref 38–126)
Anion gap: 8 (ref 5–15)
BUN: 12 mg/dL (ref 6–20)
CHLORIDE: 105 mmol/L (ref 101–111)
CO2: 25 mmol/L (ref 22–32)
CREATININE: 0.55 mg/dL (ref 0.44–1.00)
Calcium: 9.8 mg/dL (ref 8.9–10.3)
GFR calc Af Amer: >60 mL/min (ref >60)
GFR calc non Af Amer: >60 mL/min (ref >60)
Glucose, Bld: 100 mg/dL — ABNORMAL HIGH (ref 65–99)
Potassium: 4 mmol/L (ref 3.5–5.1)
SODIUM: 138 mmol/L (ref 135–145)
Total Bilirubin: 0.5 mg/dL (ref 0.3–1.2)
Total Protein: 7.9 g/dL (ref 6.5–8.1)

## 2018-02-09 ENCOUNTER — Inpatient Hospital Stay (HOSPITAL_COMMUNITY): Payer: BLUE CROSS/BLUE SHIELD | Attending: Hematology | Admitting: Internal Medicine

## 2018-02-09 ENCOUNTER — Encounter (HOSPITAL_COMMUNITY): Payer: Self-pay | Admitting: Internal Medicine

## 2018-02-09 ENCOUNTER — Other Ambulatory Visit: Payer: Self-pay

## 2018-02-09 ENCOUNTER — Ambulatory Visit (HOSPITAL_COMMUNITY): Payer: BLUE CROSS/BLUE SHIELD | Admitting: Adult Health

## 2018-02-09 VITALS — BP 123/78 | HR 105 | Temp 98.2°F | Resp 16 | Wt 155.4 lb

## 2018-02-09 DIAGNOSIS — Z853 Personal history of malignant neoplasm of breast: Secondary | ICD-10-CM

## 2018-02-09 DIAGNOSIS — C50419 Malignant neoplasm of upper-outer quadrant of unspecified female breast: Secondary | ICD-10-CM

## 2018-02-09 NOTE — Patient Instructions (Signed)
[place] at [facility] Discharge Instructions   You were seen today by Dr. Zoila Shutter. She discussed your plan of care and will get your mammogram scheduled for August. She did a manual breast exam. Please get your colonoscopy as scheduled with your GI doctor. We will see you back in 1 year after you've had your mammogram, August 2020.    Thank you for choosing [place] at [facility] to provide your oncology and hematology care.  To afford each patient quality time with our provider, please arrive at least 15 minutes before your scheduled appointment time.    If you have a lab appointment with the [facility] please come in thru the  Main Entrance and check in at the main information desk  You need to re-schedule your appointment should you arrive 10 or more minutes late.  We strive to give you quality time with our providers, and arriving late affects you and other patients whose appointments are after yours.  Also, if you no show three or more times for appointments you may be dismissed from the clinic at the providers discretion.     Again, thank you for choosing [facility].  Our hope is that these requests will decrease the amount of time that you wait before being seen by our physicians.       _____________________________________________________________  Should you have questions after your visit to [facility], please contact our office at [phone] between the hours of 8:30 a.m. and 4:30 p.m.  Voicemails left after 4:30 p.m. will not be returned until the following business day.  For prescription refill requests, have your pharmacy contact our office.       Resources For Cancer Patients and their Caregivers ? American Cancer Society: Can assist with transportation, wigs, general needs, runs Look Good Feel Better.        407-593-4916 ? Cancer Care: Provides financial assistance, online  support groups, medication/co-pay assistance.  1-800-813-HOPE (606)641-7192) ? [facility] Assists [place] cancer patients and their families through emotional , educational and financial support.  210-342-0821 ? [place] DSS Where to apply for food stamps, Medicaid and utility assistance. 239-776-6814 ? [facility]: Transportation to medical appointments. 778-420-9733 ? Social Security Administration: May apply for disability if have a Stage IV cancer. (318)867-8467 984-183-1487 ? [employer], Disability and Transit Services: Assists with nutrition, care and transit needs. [place] Support Programs:   > Cancer Support Group  2nd Tuesday of the month 1pm-2pm, Journey Room   > Creative Journey  3rd Tuesday of the month 1130am-1pm, Journey Room

## 2018-02-09 NOTE — Progress Notes (Signed)
Diagnosis Malignant neoplasm of upper-outer quadrant of female breast, unspecified estrogen receptor status, unspecified laterality ([place]) - Plan: MM DIAG BREAST TOMO BILATERAL, CBC with Differential/Platelet, Comprehensive metabolic panel, Lactate dehydrogenase, MM DIAG BREAST TOMO BILATERAL  Staging Cancer Staging No matching staging information was found for the patient.  Assessment and Plan:  1.  Stage IA (pT1c pN0) right breast cancer initially diagnosed in 2014.  She underwent a right lumpectomy on 10/09/2013 by Dr. Donne Hazel with sentinel lymph node biopsy.  Surgical path demonstrated invasive ductal carcinoma with DCIS, 0 out of 2 sentinel lymph nodes were positive for malignancy.  Final surgical path waspT1c pN0, ER/PR positive, HER-2 negative.  She underwent adjuvant radiation.  She was also following with an oncologist to [facility] and was placed on adjuvant endocrine therapy.  Patient states she tried multiple different aromatase inhibitors, however they were causing her to have severe mood changes and suicidal ideations so she stopped taking it after 1 month.  She has not had a recurrence since then.  Her most recent bilateral diagnostic mammogram was on 04/18/2017 and demonstrated no evidence of new or recurrent breast cancer.  Labs done 02/02/2018 showed WBC 4.2 HB 12.4 plts 254,000.  Chemistries WNL with normal LFTs.  Pt was last seen by Dr Talbert Cage in 08/2017.  She reports she was not being seen by oncology since that visit.  She is due to diagnostic mammogram in 04/2018.  This is set up and pt will be notified of the results.  She is 5 years out from diagnosis and pt will RTC in 04/2019 for follow-up and repeat labs and mammogram.  She is advised to notify the office if she has any problems prior to that visit.    2.  Health maintenance.  Continue to follow-up with PCP and GI as recommended.    Current Status:  Pt is seen today for follow-up.  She is here to go over labs.    Problem List Patient  Active Problem List   Diagnosis Date Noted  . Cancer of breast, intraductal, right [D05.11] 12/21/2016  . Pap smear, as part of routine gynecological examination [Z01.419] 12/21/2016  . Perimenopausal and postmenopausal vasomotor symptoms [N95.1] 10/09/2014  . Routine gynecological examination [Z01.419] 11/21/2013  . Cancer of upper-inner quadrant of female breast ([place]) [C50.219] 10/11/2013  . Menorrhagia with irregular cycle [N92.1] 07/10/2012    Past Medical History Past Medical History:  Diagnosis Date  . Anemia   . Arthritis   . Cancer (HCC)    breast  . Chronic back pain   . Chronic neck pain   . Depression   . Dysrhythmia    hx palpitations-too much caffiene  . Headache   . Wears glasses     Past Surgical History Past Surgical History:  Procedure Laterality Date  . ANKLE SURGERY     left ankle cyst  . BACK SURGERY  2000   lumbar disc surgery  . BREAST SURGERY     left partail mastectomy-cyst  . CERVICAL DISC SURGERY  1999   anterior and a posterier cerv fusionx2  . CESAREAN SECTION     x2  . COLONOSCOPY    . DILATION AND CURETTAGE OF UTERUS    . DILITATION & CURRETTAGE/HYSTROSCOPY WITH THERMACHOICE ABLATION  07/10/2012   Procedure: DILATATION & CURETTAGE/HYSTEROSCOPY WITH THERMACHOICE ABLATION;  Surgeon: [name], MD;  Location: [facility];  Service: Gynecology;  Laterality: N/A;  D5 96m in, 13 ml out, temp 87degree celcius, total therapy time 91m 16sec  .  ERCP    . TUBAL LIGATION     with last c-section    Family History Family History  Problem Relation Age of Onset  . Hypertension Mother      Social History  reports that she has never smoked. She has never used smokeless tobacco. She reports that she does not drink alcohol or use drugs.  Medications  Current Outpatient Medications:  .  ALPRAZolam (XANAX) 0.5 MG tablet, Take 0.5 mg by mouth 3 (three) times daily as needed. For anxiety, Disp: , Rfl:  .  Cholecalciferol (VITAMIN D PO), Take by  mouth., Disp: , Rfl:  .  cyclobenzaprine (FLEXERIL) 10 MG tablet, Take 10 mg by mouth 3 (three) times daily as needed. For muscle spasms, Disp: , Rfl:  .  diclofenac sodium (VOLTAREN) 1 % GEL, , Disp: , Rfl:  .  escitalopram (LEXAPRO) 20 MG tablet, Take 20 mg by mouth daily., Disp: , Rfl:  .  gabapentin (NEURONTIN) 100 MG capsule, Take 1 capsule (100 mg total) by mouth 3 (three) times daily., Disp: 15 capsule, Rfl: 2 .  HYDROcodone-acetaminophen (NORCO) 10-325 MG tablet, Take by mouth., Disp: , Rfl:  .  IBU 800 MG tablet, , Disp: , Rfl:  .  Multiple Vitamin (THERA) TABS, Take by mouth., Disp: , Rfl:  .  nabumetone (RELAFEN) 500 MG tablet, , Disp: , Rfl:  .  Toremifene Citrate (FARESTON) 60 MG tablet, , Disp: , Rfl:  .  traMADol-acetaminophen (ULTRACET) 37.5-325 MG tablet, Take 1 tablet by mouth every 4 (four) hours as needed., Disp: 90 tablet, Rfl: 5  Allergies Patient has no known allergies.  Review of Systems Review of Systems - Oncology ROS as per HPI otherwise 12 point ROS is negative.   Physical Exam  Vitals Wt Readings from Last 3 Encounters:  02/09/18 155 lb 6.4 oz (70.5 kg)  01/16/18 160 lb (72.6 kg)  08/11/17 162 lb (73.5 kg)   Temp Readings from Last 3 Encounters:  02/09/18 98.2 F (36.8 C) (Oral)  08/11/17 97.6 F (36.4 C) (Oral)  12/06/15 98.3 F (36.8 C) (Oral)   BP Readings from Last 3 Encounters:  02/09/18 123/78  01/16/18 109/72  08/11/17 131/80   Pulse Readings from Last 3 Encounters:  02/09/18 (!) 105  01/16/18 (!) 104  08/11/17 97   Constitutional: Well-developed, well-nourished, and in no distress.   HENT: Head: Normocephalic and atraumatic.  Mouth/Throat: No oropharyngeal exudate. Mucosa moist. Eyes: Pupils are equal, round, and reactive to light. Conjunctivae are normal. No scleral icterus.  Neck: Normal range of motion. Neck supple. No JVD present.  Cardiovascular: Normal rate, regular rhythm and normal heart sounds.  Exam reveals no gallop  and no friction rub.   No murmur heard. Pulmonary/Chest: Effort normal and breath sounds normal. No respiratory distress. No wheezes.No rales.  Abdominal: Soft. Bowel sounds are normal. No distension. There is no tenderness. There is no guarding.  Musculoskeletal: No edema or tenderness.  Lymphadenopathy: No cervical, axillary or supraclavicular adenopathy.  Neurological: Alert and oriented to person, place, and time. No cranial nerve deficit.  Skin: Skin is warm and dry. No rash noted. No erythema. No pallor.  Psychiatric: Affect and judgment normal.  Bilateral breast exam:  Chaperone present.  Right lumpectomy.  No dominant masses palpable bilaterally.    Labs No visits with results within 3 Day(s) from this visit.  Latest known visit with results is:  Appointment on 02/02/2018  Component Date Value Ref Range Status  . WBC 02/02/2018 4.2  4.0 - 10.5 K/uL Final  . RBC 02/02/2018 3.99  3.87 - 5.11 MIL/uL Final  . Hemoglobin 02/02/2018 12.4  12.0 - 15.0 g/dL Final  . HCT 02/02/2018 38.2  36.0 - 46.0 % Final  . MCV 02/02/2018 95.7  78.0 - 100.0 fL Final  . MCH 02/02/2018 31.1  26.0 - 34.0 pg Final  . MCHC 02/02/2018 32.5  30.0 - 36.0 g/dL Final  . RDW 02/02/2018 13.9  11.5 - 15.5 % Final  . Platelets 02/02/2018 254  150 - 400 K/uL Final  . Neutrophils Relative % 02/02/2018 58  % Final  . Neutro Abs 02/02/2018 2.5  1.7 - 7.7 K/uL Final  . Lymphocytes Relative 02/02/2018 31  % Final  . Lymphs Abs 02/02/2018 1.3  0.7 - 4.0 K/uL Final  . Monocytes Relative 02/02/2018 9  % Final  . Monocytes Absolute 02/02/2018 0.4  0.1 - 1.0 K/uL Final  . Eosinophils Relative 02/02/2018 1  % Final  . Eosinophils Absolute 02/02/2018 0.0  0.0 - 0.7 K/uL Final  . Basophils Relative 02/02/2018 1  % Final  . Basophils Absolute 02/02/2018 0.1  0.0 - 0.1 K/uL Final   Performed at [facility], [address], [place], [place] [address]  . Sodium 02/02/2018 138  135 - 145 mmol/L Final  . Potassium 02/02/2018  4.0  3.5 - 5.1 mmol/L Final  . Chloride 02/02/2018 105  101 - 111 mmol/L Final  . CO2 02/02/2018 25  22 - 32 mmol/L Final  . Glucose, Bld 02/02/2018 100* 65 - 99 mg/dL Final  . BUN 02/02/2018 12  6 - 20 mg/dL Final  . Creatinine, Ser 02/02/2018 0.55  0.44 - 1.00 mg/dL Final  . Calcium 02/02/2018 9.8  8.9 - 10.3 mg/dL Final  . Total Protein 02/02/2018 7.9  6.5 - 8.1 g/dL Final  . Albumin 02/02/2018 4.1  3.5 - 5.0 g/dL Final  . AST 02/02/2018 41  15 - 41 U/L Final  . ALT 02/02/2018 45  14 - 54 U/L Final  . Alkaline Phosphatase 02/02/2018 90  38 - 126 U/L Final  . Total Bilirubin 02/02/2018 0.5  0.3 - 1.2 mg/dL Final  . GFR calc non Af Amer 02/02/2018 >60  >60 mL/min Final  . GFR calc Af Amer 02/02/2018 >60  >60 mL/min Final   Comment: (NOTE) The eGFR has been calculated using the CKD EPI equation. This calculation has not been validated in all clinical situations. eGFR's persistently <60 mL/min signify possible Chronic Kidney Disease.   Georgiann Hahn gap 02/02/2018 8  5 - 15 Final   Performed at [facility], [address] [place], [place] [address]     Pathology Orders Placed This Encounter  Procedures  . MM DIAG BREAST TOMO BILATERAL    Standing Status:   Future    Standing Expiration Date:   02/10/2019    Order Specific Question:   Reason for Exam (SYMPTOM  OR DIAGNOSIS REQUIRED)    Answer:   right breast cancer    Order Specific Question:   Is the patient pregnant?    Answer:   No    Order Specific Question:   Preferred imaging location?    Answer:   [facility] BILATERAL    [facility]/DANIELLE   TOLD DANIELLE TO HAVE DR ADD US'S    Standing Status:   Future    Standing Expiration Date:   02/10/2020    Order Specific Question:   Reason for  Exam (SYMPTOM  OR DIAGNOSIS REQUIRED)    Answer:   right breast cancer    Order Specific Question:   Is the patient pregnant?    Answer:   No    Order Specific Question:   Preferred imaging location?    Answer:   [facility]  . CBC with Differential/Platelet    Standing Status:   Future    Standing Expiration Date:   02/10/2020  . Comprehensive metabolic panel    Standing Status:   Future    Standing Expiration Date:   02/10/2020  . Lactate dehydrogenase    Standing Status:   Future    Standing Expiration Date:   02/10/2020       Zoila Shutter MD

## 2018-02-12 ENCOUNTER — Other Ambulatory Visit: Payer: Self-pay

## 2018-02-12 ENCOUNTER — Encounter (HOSPITAL_COMMUNITY): Payer: Self-pay | Admitting: Emergency Medicine

## 2018-02-12 ENCOUNTER — Emergency Department (HOSPITAL_COMMUNITY)
Admission: EM | Admit: 2018-02-12 | Discharge: 2018-02-12 | Disposition: A | Discharge status: Home / Self Care | Payer: BLUE CROSS/BLUE SHIELD | Attending: Emergency Medicine | Admitting: Emergency Medicine

## 2018-02-12 ENCOUNTER — Emergency Department (HOSPITAL_COMMUNITY): Payer: BLUE CROSS/BLUE SHIELD

## 2018-02-12 DIAGNOSIS — R0602 Shortness of breath: Secondary | ICD-10-CM | POA: Diagnosis not present

## 2018-02-12 DIAGNOSIS — Z1389 Encounter for screening for other disorder: Secondary | ICD-10-CM | POA: Diagnosis not present

## 2018-02-12 DIAGNOSIS — J208 Acute bronchitis due to other specified organisms: Secondary | ICD-10-CM | POA: Diagnosis not present

## 2018-02-12 DIAGNOSIS — R110 Nausea: Secondary | ICD-10-CM | POA: Diagnosis not present

## 2018-02-12 DIAGNOSIS — J069 Acute upper respiratory infection, unspecified: Secondary | ICD-10-CM | POA: Diagnosis not present

## 2018-02-12 DIAGNOSIS — Z6822 Body mass index (BMI) 22.0-22.9, adult: Secondary | ICD-10-CM | POA: Diagnosis not present

## 2018-02-12 DIAGNOSIS — Z79899 Other long term (current) drug therapy: Secondary | ICD-10-CM | POA: Diagnosis not present

## 2018-02-12 LAB — COMPREHENSIVE METABOLIC PANEL
ALT: 75 U/L — AB (ref 14–54)
AST: 50 U/L — ABNORMAL HIGH (ref 15–41)
Albumin: 4.4 g/dL (ref 3.5–5.0)
Alkaline Phosphatase: 118 U/L (ref 38–126)
Anion gap: 12 (ref 5–15)
BILIRUBIN TOTAL: 1 mg/dL (ref 0.3–1.2)
BUN: 9 mg/dL (ref 6–20)
CHLORIDE: 104 mmol/L (ref 101–111)
CO2: 23 mmol/L (ref 22–32)
CREATININE: 0.8 mg/dL (ref 0.44–1.00)
Calcium: 10.3 mg/dL (ref 8.9–10.3)
Glucose, Bld: 116 mg/dL — ABNORMAL HIGH (ref 65–99)
Potassium: 3.4 mmol/L — ABNORMAL LOW (ref 3.5–5.1)
Sodium: 139 mmol/L (ref 135–145)
TOTAL PROTEIN: 8.4 g/dL — AB (ref 6.5–8.1)

## 2018-02-12 LAB — CBC WITH DIFFERENTIAL/PLATELET
BASOS ABS: 0 10*3/uL (ref 0.0–0.1)
BASOS PCT: 1 %
EOS PCT: 0 %
Eosinophils Absolute: 0 10*3/uL (ref 0.0–0.7)
HCT: 37.9 % (ref 36.0–46.0)
HEMOGLOBIN: 12.5 g/dL (ref 12.0–15.0)
LYMPHS ABS: 1 10*3/uL (ref 0.7–4.0)
Lymphocytes Relative: 19 %
MCH: 31.2 pg (ref 26.0–34.0)
MCHC: 33 g/dL (ref 30.0–36.0)
MCV: 94.5 fL (ref 78.0–100.0)
Monocytes Absolute: 0.9 10*3/uL (ref 0.1–1.0)
Monocytes Relative: 16 %
NEUTROS PCT: 64 %
Neutro Abs: 3.6 10*3/uL (ref 1.7–7.7)
Platelets: 273 10*3/uL (ref 150–400)
RBC: 4.01 MIL/uL (ref 3.87–5.11)
RDW: 14 % (ref 11.5–15.5)
WBC: 5.6 10*3/uL (ref 4.0–10.5)

## 2018-02-12 LAB — BRAIN NATRIURETIC PEPTIDE: B NATRIURETIC PEPTIDE 5: 27 pg/mL (ref 0.0–100.0)

## 2018-02-12 LAB — TROPONIN I

## 2018-02-12 MED ORDER — LORAZEPAM 2 MG/ML IJ SOLN
0.5000 mg | Freq: Once | INTRAMUSCULAR | Status: AC
  Administered 2018-02-12: 0.5 mg via INTRAVENOUS
  Filled 2018-02-12: qty 1

## 2018-02-12 MED ORDER — SODIUM CHLORIDE 0.9 % IV BOLUS
1000.0000 mL | Freq: Once | INTRAVENOUS | Status: AC
  Administered 2018-02-12: 1000 mL via INTRAVENOUS

## 2018-02-12 MED ORDER — ALBUTEROL SULFATE (2.5 MG/3ML) 0.083% IN NEBU
5.0000 mg | INHALATION_SOLUTION | Freq: Once | RESPIRATORY_TRACT | Status: AC
  Administered 2018-02-12: 5 mg via RESPIRATORY_TRACT
  Filled 2018-02-12: qty 6

## 2018-02-12 MED ORDER — METHYLPREDNISOLONE SODIUM SUCC 125 MG IJ SOLR
125.0000 mg | Freq: Once | INTRAMUSCULAR | Status: AC
  Administered 2018-02-12: 125 mg via INTRAVENOUS
  Filled 2018-02-12: qty 2

## 2018-02-12 MED ORDER — LIDOCAINE VISCOUS HCL 2 % MT SOLN
15.0000 mL | Freq: Once | OROMUCOSAL | Status: AC
  Administered 2018-02-12: 15 mL via OROMUCOSAL
  Filled 2018-02-12: qty 15

## 2018-02-12 MED ORDER — ALBUTEROL (5 MG/ML) CONTINUOUS INHALATION SOLN
10.0000 mg/h | INHALATION_SOLUTION | RESPIRATORY_TRACT | Status: DC
  Administered 2018-02-12: 10 mg/h via RESPIRATORY_TRACT
  Filled 2018-02-12: qty 20

## 2018-02-12 MED ORDER — PREDNISONE 20 MG PO TABS: 40.0000 mg | ORAL_TABLET | Freq: Every day | ORAL | 0 refills | Status: DC

## 2018-02-12 MED ORDER — ALBUTEROL SULFATE HFA 108 (90 BASE) MCG/ACT IN AERS
2.0000 | INHALATION_SPRAY | Freq: Four times a day (QID) | RESPIRATORY_TRACT | Status: DC
  Administered 2018-02-12: 2 via RESPIRATORY_TRACT
  Filled 2018-02-12: qty 6.7

## 2018-02-12 NOTE — ED Triage Notes (Signed)
Pt states she has been having increased congestion since Friday, was seen by PCP today and given a steroid shot without improvement. Pt also learned her mother in law passed away 1 hour ago, experiencing panic attack. States SOB began when she learned of passing.

## 2018-02-12 NOTE — ED Provider Notes (Signed)
[facility] Provider Note   CSN: [id number] Arrival date & time: 02/12/18  1556     History   Chief Complaint Chief Complaint  Patient presents with  . Shortness of Breath    HPI [name] is a 57 y.o. female.  HPI Presents with concern of dyspnea, discomfort, weakness, sore throat. Onset was 2 or 3 days ago, but symptoms become progressively worse over the course of today. There is some associated generalized weakness, but patient's primary concern is her ongoing cough, sore throat, weakness. No relief with OTC medication, and the patient was actually started on azithromycin by her physician already. She has a notable history of breast cancer, now in remission.  She is here with her husband who notes that he had a similar illness a few days ago, prior to the onset of this patient's illness. Past Medical History:  Diagnosis Date  . Anemia   . Arthritis   . Cancer (HCC)    breast  . Chronic back pain   . Chronic neck pain   . Depression   . Dysrhythmia    hx palpitations-too much caffiene  . Headache   . Wears glasses     Patient Active Problem List   Diagnosis Date Noted  . Cancer of breast, intraductal, right 12/21/2016  . Pap smear, as part of routine gynecological examination 12/21/2016  . Perimenopausal and postmenopausal vasomotor symptoms 10/09/2014  . Routine gynecological examination 11/21/2013  . Cancer of upper-inner quadrant of female breast ([place]) 10/11/2013  . Menorrhagia with irregular cycle 07/10/2012    Past Surgical History:  Procedure Laterality Date  . ANKLE SURGERY     left ankle cyst  . BACK SURGERY  2000   lumbar disc surgery  . BREAST SURGERY     left partail mastectomy-cyst  . CERVICAL DISC SURGERY  1999   anterior and a posterier cerv fusionx2  . CESAREAN SECTION     x2  . COLONOSCOPY    . DILATION AND CURETTAGE OF UTERUS    . DILITATION & CURRETTAGE/HYSTROSCOPY WITH THERMACHOICE ABLATION  07/10/2012   Procedure: DILATATION & CURETTAGE/HYSTEROSCOPY WITH THERMACHOICE ABLATION;  Surgeon: [name], MD;  Location: [facility];  Service: Gynecology;  Laterality: N/A;  D5 22ml in, 13 ml out, temp 87degree celcius, total therapy time 74min 16sec  . ERCP    . TUBAL LIGATION     with last c-section     OB History   None      Home Medications    Prior to Admission medications   Medication Sig Start Date End Date Taking? Authorizing Provider  ALPRAZolam Duanne Moron) 0.5 MG tablet Take 0.5 mg by mouth 4 (four) times daily as needed. For anxiety   Yes [provider]  benzonatate (TESSALON) 200 MG capsule Take 200 mg by mouth 3 (three) times daily as needed for cough.   Yes [provider]  calcium carbonate (CALCIUM 600) 1500 (600 Ca) MG TABS tablet Take 600 mg of elemental calcium by mouth 2 (two) times daily with a meal.   Yes [provider]  cholecalciferol (VITAMIN D) 400 units TABS tablet Take 400 Units by mouth daily.   Yes [provider]  cyclobenzaprine (FLEXERIL) 10 MG tablet Take 10 mg by mouth 3 (three) times daily as needed. For muscle spasms   Yes [provider]  diclofenac sodium (VOLTAREN) 1 % GEL Apply 2 g topically daily as needed (for pain).  11/24/17  Yes [provider]  escitalopram (LEXAPRO) 20 MG tablet Take 20 mg by mouth daily.   Yes [provider]  IBU 800 MG tablet Take 800 mg by mouth 2 (two) times daily as needed for mild pain or moderate pain.  02/01/18  Yes [provider]  venlafaxine XR (EFFEXOR-XR) 150 MG 24 hr capsule Take 150 mg by mouth daily with breakfast.   Yes [provider]  azithromycin (ZITHROMAX) 250 MG tablet Take 250-500 mg by mouth See admin instructions. 500mg  on day 1 then take 250mg  on days 2 through 5 02/12/18   [provider]  gabapentin (NEURONTIN) 100 MG capsule Take 1 capsule (100 mg total) by mouth 3 (three) times daily. Patient not taking: Reported on  02/12/2018 01/19/18   Carole Civil, MD  ondansetron (ZOFRAN-ODT) 4 MG disintegrating tablet Take 4 mg by mouth every 8 (eight) hours as needed for nausea or vomiting.  02/12/18   [provider]  predniSONE (DELTASONE) 20 MG tablet Take 2 tablets (40 mg total) by mouth daily with breakfast. For the next four days 02/12/18   [name], MD    Family History Family History  Problem Relation Age of Onset  . Hypertension Mother     Social History Social History   Tobacco Use  . Smoking status: Never Smoker  . Smokeless tobacco: Never Used  Substance Use Topics  . Alcohol use: No  . Drug use: No     Allergies   Patient has no known allergies.   Review of Systems Review of Systems  Constitutional:       Per HPI, otherwise negative  HENT:       Per HPI, otherwise negative  Respiratory:       Per HPI, otherwise negative  Cardiovascular:       Per HPI, otherwise negative  Gastrointestinal: Negative for vomiting.  Endocrine:       Negative aside from HPI  Genitourinary:       Neg aside from HPI   Musculoskeletal:       Per HPI, otherwise negative  Skin: Negative.   Neurological: Negative for syncope.     Physical Exam Updated Vital Signs BP 127/75   Pulse 99   Temp 99.1 F (37.3 C) (Oral)   Resp 20   Ht 5\' 7"  (1.702 m)   Wt 70.8 kg (156 lb)   LMP 12/06/2013 Comment: pt states she had a surgery and they inserted a balloon to slow her periods down. she has a light period every month  SpO2 100%   BMI 24.43 kg/m   Physical Exam  Constitutional: She is oriented to person, place, and time. She appears well-developed and well-nourished. She appears ill. No distress.  Uncomfortable appearing female awake and alert  HENT:  Head: Normocephalic and atraumatic.  Eyes: Conjunctivae and EOM are normal.  Cardiovascular: Regular rhythm.  Tachycardia  Pulmonary/Chest: Tachypnea noted. She has decreased breath sounds.  Abdominal: She exhibits no  distension.  Musculoskeletal: She exhibits no edema.  Neurological: She is alert and oriented to person, place, and time. No cranial nerve deficit.  Skin: Skin is warm and dry.  Psychiatric: She has a normal mood and affect.  Nursing note and vitals reviewed.    ED Treatments / Results  Labs (all labs ordered are listed, but only abnormal results are displayed) Labs Reviewed  COMPREHENSIVE METABOLIC PANEL - Abnormal; Notable for the following components:      Result Value   Potassium 3.4 (*)  Glucose, Bld 116 (*)    Total Protein 8.4 (*)    AST 50 (*)    ALT 75 (*)    All other components within normal limits  CBC WITH DIFFERENTIAL/PLATELET  TROPONIN I  BRAIN NATRIURETIC PEPTIDE    EKG EKG Interpretation  Date/Time:  Monday February 12 2018 16:07:03 EDT Ventricular Rate:  117 PR Interval:    QRS Duration: 101 QT Interval:  338 QTC Calculation: 472 R Axis:   57 Text Interpretation:  Sinus tachycardia Premature ventricular complexes Artifact otherwise similar to prior, abnormal Confirmed by [name] [phone]) on 02/12/2018 5:40:37 PM   Radiology Dg Chest 2 View  Result Date: 02/12/2018 CLINICAL DATA:  Short of breath congestion EXAM: CHEST - 2 VIEW COMPARISON:  12/06/2015 FINDINGS: The heart size and mediastinal contours are within normal limits. Both lungs are clear. The visualized skeletal structures are unremarkable. Surgical clips right breast. IMPRESSION: No active cardiopulmonary disease. Electronically Signed   By: Franchot Gallo M.D.   On: 02/12/2018 17:24    Procedures Procedures (including critical care time)  Medications Ordered in ED Medications  albuterol (PROVENTIL,VENTOLIN) solution continuous neb (10 mg/hr Nebulization New Bag/Given 02/12/18 1935)  albuterol (PROVENTIL HFA;VENTOLIN HFA) 108 (90 Base) MCG/ACT inhaler 2 puff (has no administration in time range)  albuterol (PROVENTIL) (2.5 MG/3ML) 0.083% nebulizer solution 5 mg (5 mg Nebulization Given  02/12/18 1621)  LORazepam (ATIVAN) injection 0.5 mg (0.5 mg Intravenous Given 02/12/18 1631)  sodium chloride 0.9 % bolus 1,000 mL (0 mLs Intravenous Stopped 02/12/18 1735)  sodium chloride 0.9 % bolus 1,000 mL (0 mLs Intravenous Stopped 02/12/18 2019)  methylPREDNISolone sodium succinate (SOLU-MEDROL) 125 mg/2 mL injection 125 mg (125 mg Intravenous Given 02/12/18 2020)  lidocaine (XYLOCAINE) 2 % viscous mouth solution 15 mL (15 mLs Mouth/Throat Given 02/12/18 2020)     Initial Impression / Assessment and Plan / ED Course  I have reviewed the triage vital signs and the nursing notes.  Pertinent labs & imaging results that were available during my care of the patient were reviewed by me and considered in my medical decision making (see chart for details).   After the patient's initial evaluation she received IV fluids, albuterol, and now on repeat exam, following those interventions as well as Ativan, she states that she feels substantially better. She remains mildly tachycardic.   8:52 PM Now, following 2 L of fluid, multiple breathing treatments, the patient is feeling substantially better. Labs, vitals reviewed with the patient's husband and son.  The patient does have minor tachycardia, but has received substantial albuterol. She states that she improved substantially, breathing is better, pain is reduced, and there has been no fever. No evidence for pneumonia, the patient is not a smoker. Patient may be expensing bronchitis, but no evidence of bacteremia, sepsis, ongoing coronary ischemia, given her improvement here she was discharged with addition of steroids to her regimen, albuterol for additional cough control.  Final Clinical Impressions(s) / ED Diagnoses   Final diagnoses:  Acute bronchitis due to other specified organisms    ED Discharge Orders        Ordered    predniSONE (DELTASONE) 20 MG tablet  Daily with breakfast     02/12/18 2051       [name], MD 02/12/18  2053

## 2018-02-12 NOTE — Discharge Instructions (Signed)
Please continue to take your recently prescribed medication as directed, and in addition use the prescribed steroids.  Please use the provided albuterol inhaler as needed for additional cough control.  Return here for concerning changes in your condition.

## 2018-02-14 ENCOUNTER — Encounter: Payer: Self-pay | Admitting: Gastroenterology

## 2018-04-12 ENCOUNTER — Encounter (HOSPITAL_COMMUNITY): Payer: Self-pay

## 2018-04-12 ENCOUNTER — Emergency Department (HOSPITAL_COMMUNITY): Payer: BLUE CROSS/BLUE SHIELD

## 2018-04-12 ENCOUNTER — Other Ambulatory Visit: Payer: Self-pay

## 2018-04-12 ENCOUNTER — Emergency Department (HOSPITAL_COMMUNITY)
Admission: EM | Admit: 2018-04-12 | Discharge: 2018-04-12 | Disposition: A | Discharge status: Home / Self Care | Payer: BLUE CROSS/BLUE SHIELD | Attending: Emergency Medicine | Admitting: Emergency Medicine

## 2018-04-12 DIAGNOSIS — R0602 Shortness of breath: Secondary | ICD-10-CM | POA: Diagnosis not present

## 2018-04-12 DIAGNOSIS — R002 Palpitations: Secondary | ICD-10-CM | POA: Diagnosis not present

## 2018-04-12 DIAGNOSIS — Z853 Personal history of malignant neoplasm of breast: Secondary | ICD-10-CM | POA: Insufficient documentation

## 2018-04-12 DIAGNOSIS — Z79899 Other long term (current) drug therapy: Secondary | ICD-10-CM | POA: Insufficient documentation

## 2018-04-12 LAB — TROPONIN I

## 2018-04-12 LAB — CBC WITH DIFFERENTIAL/PLATELET
Basophils Absolute: 0 10*3/uL (ref 0.0–0.1)
Basophils Relative: 1 %
EOS ABS: 0 10*3/uL (ref 0.0–0.7)
Eosinophils Relative: 0 %
HCT: 36.8 % (ref 36.0–46.0)
HEMOGLOBIN: 12.3 g/dL (ref 12.0–15.0)
LYMPHS ABS: 1.6 10*3/uL (ref 0.7–4.0)
LYMPHS PCT: 36 %
MCH: 31.8 pg (ref 26.0–34.0)
MCHC: 33.4 g/dL (ref 30.0–36.0)
MCV: 95.1 fL (ref 78.0–100.0)
Monocytes Absolute: 0.5 10*3/uL (ref 0.1–1.0)
Monocytes Relative: 10 %
NEUTROS PCT: 53 %
Neutro Abs: 2.4 10*3/uL (ref 1.7–7.7)
Platelets: 301 10*3/uL (ref 150–400)
RBC: 3.87 MIL/uL (ref 3.87–5.11)
RDW: 14.2 % (ref 11.5–15.5)
WBC: 4.5 10*3/uL (ref 4.0–10.5)

## 2018-04-12 LAB — COMPREHENSIVE METABOLIC PANEL
ALK PHOS: 76 U/L (ref 38–126)
ALT: 24 U/L (ref 0–44)
AST: 26 U/L (ref 15–41)
Albumin: 4.1 g/dL (ref 3.5–5.0)
Anion gap: 9 (ref 5–15)
BUN: 11 mg/dL (ref 6–20)
CALCIUM: 9.6 mg/dL (ref 8.9–10.3)
CO2: 25 mmol/L (ref 22–32)
CREATININE: 0.57 mg/dL (ref 0.44–1.00)
Chloride: 103 mmol/L (ref 98–111)
Glucose, Bld: 98 mg/dL (ref 70–99)
Potassium: 3.2 mmol/L — ABNORMAL LOW (ref 3.5–5.1)
SODIUM: 137 mmol/L (ref 135–145)
Total Bilirubin: 0.7 mg/dL (ref 0.3–1.2)
Total Protein: 7.8 g/dL (ref 6.5–8.1)

## 2018-04-12 LAB — D-DIMER, QUANTITATIVE (NOT AT ARMC)

## 2018-04-12 LAB — TSH: TSH: 3.937 u[IU]/mL (ref 0.350–4.500)

## 2018-04-12 MED ORDER — CYCLOBENZAPRINE HCL 10 MG PO TABS
10.0000 mg | ORAL_TABLET | Freq: Once | ORAL | Status: AC
  Administered 2018-04-12: 10 mg via ORAL
  Filled 2018-04-12: qty 1

## 2018-04-12 MED ORDER — IOPAMIDOL (ISOVUE-370) INJECTION 76%
100.0000 mL | Freq: Once | INTRAVENOUS | Status: AC | PRN
  Administered 2018-04-12: 100 mL via INTRAVENOUS

## 2018-04-12 NOTE — ED Provider Notes (Addendum)
[facility] EMERGENCY DEPARTMENT Provider Note   CSN: [id number] Arrival date & time: 04/12/18  4098     History   Chief Complaint Chief Complaint  Patient presents with  . Palpitations  . Shortness of Breath    HPI [name] is a 57 y.o. female.  Patient presents with concern for heart palpitations for 1 month with associated headache and difficulty concentrating.  She had initially been on Vicodin for chronic back and neck pain.  This was switched to ibuprofen and finally discontinued.  Review of systems positive for questionable dyspnea.  No fever, sweats, chills, substernal chest pain, nausea, diaphoresis, neurological deficits.  S.  No cigarette smoking.  She has taken Flexeril and Xanax in the past for her back.  She has a remote history of breast cancer in 2014 that she states is under remission.     Past Medical History:  Diagnosis Date  . Anemia   . Arthritis   . Cancer (HCC)    breast  . Chronic back pain   . Chronic neck pain   . Depression   . Dysrhythmia    hx palpitations-too much caffiene  . Headache   . Wears glasses     Patient Active Problem List   Diagnosis Date Noted  . Cancer of breast, intraductal, right 12/21/2016  . Pap smear, as part of routine gynecological examination 12/21/2016  . Perimenopausal and postmenopausal vasomotor symptoms 10/09/2014  . Routine gynecological examination 11/21/2013  . Cancer of upper-inner quadrant of female breast ([place]) 10/11/2013  . Menorrhagia with irregular cycle 07/10/2012    Past Surgical History:  Procedure Laterality Date  . ANKLE SURGERY     left ankle cyst  . BACK SURGERY  2000   lumbar disc surgery  . BREAST SURGERY     left partail mastectomy-cyst  . CERVICAL DISC SURGERY  1999   anterior and a posterier cerv fusionx2  . CESAREAN SECTION     x2  . COLONOSCOPY    . DILATION AND CURETTAGE OF UTERUS    . DILITATION & CURRETTAGE/HYSTROSCOPY WITH THERMACHOICE ABLATION  07/10/2012   Procedure:  DILATATION & CURETTAGE/HYSTEROSCOPY WITH THERMACHOICE ABLATION;  Surgeon: [name], MD;  Location: [facility];  Service: Gynecology;  Laterality: N/A;  D5 24ml in, 13 ml out, temp 87degree celcius, total therapy time 64min 16sec  . ERCP    . TUBAL LIGATION     with last c-section     OB History   None      Home Medications    Prior to Admission medications   Medication Sig Start Date End Date Taking? Authorizing Provider  ALPRAZolam Duanne Moron) 0.5 MG tablet Take 0.5 mg by mouth 4 (four) times daily as needed. For anxiety   Yes [provider]  calcium carbonate (CALCIUM 600) 1500 (600 Ca) MG TABS tablet Take 600 mg of elemental calcium by mouth 2 (two) times daily with a meal.   Yes [provider]  cholecalciferol (VITAMIN D) 400 units TABS tablet Take 400 Units by mouth daily.   Yes [provider]  cyclobenzaprine (FLEXERIL) 10 MG tablet Take 10 mg by mouth 3 (three) times daily as needed. For muscle spasms   Yes [provider]  IBU 800 MG tablet Take 800 mg by mouth 2 (two) times daily as needed for mild pain or moderate pain.  02/01/18  Yes [provider]  venlafaxine XR (EFFEXOR-XR) 150 MG 24 hr capsule Take 150 mg by mouth daily with  breakfast.   Yes [provider]  azithromycin (ZITHROMAX) 250 MG tablet Take 250-500 mg by mouth See admin instructions. 500mg  on day 1 then take 250mg  on days 2 through 5 02/12/18   [provider]  benzonatate (TESSALON) 200 MG capsule Take 200 mg by mouth 3 (three) times daily as needed for cough.    [provider]  gabapentin (NEURONTIN) 100 MG capsule Take 1 capsule (100 mg total) by mouth 3 (three) times daily. Patient not taking: Reported on 02/12/2018 01/19/18   Carole Civil, MD  ondansetron (ZOFRAN-ODT) 4 MG disintegrating tablet Take 4 mg by mouth every 8 (eight) hours as needed for nausea or vomiting.  02/12/18   [provider]  predniSONE (DELTASONE) 20 MG  tablet Take 2 tablets (40 mg total) by mouth daily with breakfast. For the next four days Patient not taking: Reported on 04/12/2018 02/12/18   [name], MD    Family History Family History  Problem Relation Age of Onset  . Hypertension Mother     Social History Social History   Tobacco Use  . Smoking status: Never Smoker  . Smokeless tobacco: Never Used  Substance Use Topics  . Alcohol use: No  . Drug use: No     Allergies   Patient has no known allergies.   Review of Systems Review of Systems  All other systems reviewed and are negative.    Physical Exam Updated Vital Signs BP (!) 116/94   Pulse 85   Temp 97.6 F (36.4 C) (Oral)   Resp 16   Ht 5\' 7"  (1.702 m)   Wt 68.9 kg   LMP 12/06/2013 Comment: pt states she had a surgery and they inserted a balloon to slow her periods down. she has a light period every month  SpO2 100%   BMI 23.81 kg/m   Physical Exam  Constitutional: She is oriented to person, place, and time. She appears well-developed and well-nourished.  HENT:  Head: Normocephalic and atraumatic.  Eyes: Conjunctivae are normal.  Neck: Neck supple.  Cardiovascular: Normal rate and regular rhythm.  Pulmonary/Chest: Effort normal and breath sounds normal.  Abdominal: Soft. Bowel sounds are normal.  Musculoskeletal: Normal range of motion.  Neurological: She is alert and oriented to person, place, and time.  Skin: Skin is warm and dry.  Psychiatric: She has a normal mood and affect. Her behavior is normal.  Nursing note and vitals reviewed.    ED Treatments / Results  Labs (all labs ordered are listed, but only abnormal results are displayed) Labs Reviewed  COMPREHENSIVE METABOLIC PANEL - Abnormal; Notable for the following components:      Result Value   Potassium 3.2 (*)    All other components within normal limits  D-DIMER, QUANTITATIVE (NOT AT [facility]) - Abnormal; Notable for the following components:   D-Dimer, Quant >20.00 (*)     All other components within normal limits  CBC WITH DIFFERENTIAL/PLATELET  TSH  TROPONIN I    EKG EKG Interpretation  Date/Time:  Thursday April 12 2018 07:42:07 EDT Ventricular Rate:  96 PR Interval:    QRS Duration: 85 QT Interval:  372 QTC Calculation: 471 R Axis:   64 Text Interpretation:  Sinus rhythm Confirmed by Nat Christen [id number]) on 04/12/2018 8:43:10 AM   Radiology Dg Chest 2 View  Result Date: 04/12/2018 CLINICAL DATA:  Shortness of breath. Headaches. Duration of symptoms 2 months. EXAM: CHEST - 2 VIEW COMPARISON:  02/12/2018 FINDINGS: Heart size is normal. Mediastinal  shadows are normal. The lungs are clear. The vascularity is normal. Surgical clips in the region of the right breast. No significant bone finding. Previous cervical fusion. IMPRESSION: No active cardiopulmonary disease. Electronically Signed   By: Nelson Chimes M.D.   On: 04/12/2018 10:05   Ct Angio Chest Pe W And/or Wo Contrast  Result Date: 04/12/2018 CLINICAL DATA:  Shortness of breath would headaches for 4 days. History of breast cancer with surgery in 2000. EXAM: CT ANGIOGRAPHY CHEST WITH CONTRAST TECHNIQUE: Multidetector CT imaging of the chest was performed using the standard protocol during bolus administration of intravenous contrast. Multiplanar CT image reconstructions and MIPs were obtained to evaluate the vascular anatomy. CONTRAST:  158mL ISOVUE-370 IOPAMIDOL (ISOVUE-370) INJECTION 76% COMPARISON:  None. FINDINGS: Cardiovascular: There is no pulmonary embolism identified within the main, lobar or segmental pulmonary arteries bilaterally. No thoracic aortic aneurysm or evidence of aortic dissection. Heart size is normal. No pericardial effusion. Mediastinum/Nodes: No mass or enlarged lymph nodes within the mediastinum, perihilar or axillary regions. Esophagus is unremarkable. Trachea and central bronchi are unremarkable. Lungs/Pleura: Lungs are clear.  No pleural effusion or pneumothorax. Upper Abdomen:  Limited images of the upper abdomen are unremarkable. Musculoskeletal: Mild degenerative spurring within the slightly scoliotic thoracic spine. No acute or suspicious osseous finding. Postsurgical changes within the RIGHT breast. Superficial soft tissues otherwise unremarkable. Review of the MIP images confirms the above findings. IMPRESSION: 1. No acute findings. No pulmonary embolism. No evidence of pneumonia or pulmonary edema. 2. Chronic/incidental findings detailed above. Electronically Signed   By: Franki Cabot M.D.   On: 04/12/2018 12:35    Procedures Procedures (including critical care time)  Medications Ordered in ED Medications  iopamidol (ISOVUE-370) 76 % injection 100 mL (100 mLs Intravenous Contrast Given 04/12/18 1154)  cyclobenzaprine (FLEXERIL) tablet 10 mg (10 mg Oral Given 04/12/18 1229)     Initial Impression / Assessment and Plan / ED Course  I have reviewed the triage vital signs and the nursing notes.  Pertinent labs & imaging results that were available during my care of the patient were reviewed by me and considered in my medical decision making (see chart for details).     Patient presents with a constellation of symptoms well documented in HPI.  Work-up including labs, EKG, troponin, d-dimer, TSH, CT angios chest all negative.  These findings were discussed with the patient and her husband.  She will follow-up with primary care.  Final Clinical Impressions(s) / ED Diagnoses   Final diagnoses:  Palpitations    ED Discharge Orders    None       Nat Christen, MD 04/12/18 1514    Nat Christen, MD 04/12/18 1515

## 2018-04-12 NOTE — Discharge Instructions (Addendum)
Test showed no life-threatening condition.  Follow-up with your primary care doctor.  Try to eat potassium rich foods like bananas and oranges

## 2018-04-12 NOTE — ED Triage Notes (Signed)
Pt reports she had been on hydrocodone for back pain and her pcp took her off of it 2 months ago and started her on ibuprofen 800mg .   Pt says since then she's been having headaches and difficulty concentrating.  Reports symptoms have worsened this week and c/o sob.

## 2018-04-16 ENCOUNTER — Encounter: Payer: Self-pay | Admitting: Nurse Practitioner

## 2018-04-16 ENCOUNTER — Ambulatory Visit (INDEPENDENT_AMBULATORY_CARE_PROVIDER_SITE_OTHER): Payer: BLUE CROSS/BLUE SHIELD | Admitting: Nurse Practitioner

## 2018-04-16 ENCOUNTER — Other Ambulatory Visit: Payer: Self-pay

## 2018-04-16 DIAGNOSIS — Z1211 Encounter for screening for malignant neoplasm of colon: Secondary | ICD-10-CM | POA: Diagnosis not present

## 2018-04-16 DIAGNOSIS — K5900 Constipation, unspecified: Secondary | ICD-10-CM | POA: Insufficient documentation

## 2018-04-16 MED ORDER — CLENPIQ 10-3.5-12 MG-GM -GM/160ML PO SOLN: 1.0000 | Freq: Once | ORAL | 0 refills | Status: AC

## 2018-04-16 NOTE — Patient Instructions (Signed)
1. I am giving you samples of Linzess 72 mcg.  Take this once a day on an empty stomach. 2. Call us in 1 to 2 weeks and let us know if it is helping you have better bowel movements. 3. As we discussed, the most common side effect is diarrhea although this typically resolves on its own in about 5 days or less. 4. We will schedule your colonoscopy for you. 5. Further recommendations will be made after your colonoscopy. 6. Return for follow-up in 3 months. 7. Call us if you have any questions or concerns.  At [facility] we value your feedback. You may receive a survey about your visit today. Please share your experience as we strive to create trusting relationships with our patients to provide genuine, compassionate, quality care.  It was great to meet you today!  I hope you have a great summer!!

## 2018-04-16 NOTE — Assessment & Plan Note (Signed)
The patient is complaining of constipation with a bowel movement about once a week with associated bloating, straining, hard stools.  Her mother had significant constipation as well.  She is tried over-the-counter laxatives and Citrucel.  Citrucel does seem to help somewhat but not always.  At this point I will trial her on Linzess 72 mcg once a day.  We will provide samples and request a progress report 1 to 2 weeks.  I reviewed possible adverse effects, the most common of which is diarrhea which typically self resolved.  She is due for colonoscopy and we will proceed as well at this time.  Follow-up in 3 months.

## 2018-04-16 NOTE — Progress Notes (Addendum)
REVIEWED-NO ADDITIONAL RECOMMENDATIONS.  Primary Care Physician:  Redmond School, MD Primary Gastroenterologist:  Dr. Oneida Alar  Chief Complaint  Patient presents with  . Colonoscopy    consult  . Constipation    HPI:   [name] is a 57 y.o. female who presents on referral from primary care to schedule repeat colonoscopy.  Phone/nurse triage was deferred to office visit due to medications.  It appears his last colonoscopy along with upper endoscopy was completed 05/26/2008 for iron deficiency anemia.  Endings include tortuous sigmoid colon which would not allow successful intubation of the distal terminal ileum, rare ascending colon diverticula, small internal hemorrhoids, widely patent Schatzki's ring in the distal esophagus without evidence of Barrett's; small hiatal hernia with a J-shaped stomach that displaced the pylorus to the left which made advancing the second portion of the duodenum challenging but overall found to be normal.  Iron deficiency anemia likely multifactorial related to gastritis and menstrual blood loss.  Recommended avoid all NSAIDs for 30 days, start Prilosec 20 mg daily, follow-up in the office, avoid gastric irritants, no anticoagulation for 5 days, repeat colonoscopy in 10 years (2019).  Today she states she's doing ok overall. She is having some constipation and her mother had a history of this. Has tried dietary changes to help. Has tried laxatives and Citrucel over the counter (Citrucel has helped sometimes and other times not.) Has a bowel movement once a week, hard stools, straining. Denies hematochezia. No associated abdominal pain. Denies melena, fever, chills, unintentional weight loss. Denies chest pain, dyspnea, dizziness, lightheadedness, syncope, near syncope. Denies any other upper or lower GI symptoms.  Was in the ER last week with palpitations and found hypokalemia (3.2).  Past Medical History:  Diagnosis Date  . Anemia   . Arthritis   . Cancer (HCC)      breast  . Chronic back pain   . Chronic neck pain   . Depression   . Dysrhythmia    hx palpitations-too much caffiene  . Headache   . Wears glasses     Past Surgical History:  Procedure Laterality Date  . ANKLE SURGERY     left ankle cyst  . BACK SURGERY  2000   lumbar disc surgery  . BREAST SURGERY     left partail mastectomy-cyst  . CERVICAL DISC SURGERY  1999   anterior and a posterier cerv fusionx2  . CESAREAN SECTION     x2  . COLONOSCOPY    . DILATION AND CURETTAGE OF UTERUS    . DILITATION & CURRETTAGE/HYSTROSCOPY WITH THERMACHOICE ABLATION  07/10/2012   Procedure: DILATATION & CURETTAGE/HYSTEROSCOPY WITH THERMACHOICE ABLATION;  Surgeon: [name], MD;  Location: [facility];  Service: Gynecology;  Laterality: N/A;  D5 17ml in, 13 ml out, temp 87degree celcius, total therapy time 74min 16sec  . ERCP    . TUBAL LIGATION     with last c-section    Current Outpatient Medications  Medication Sig Dispense Refill  . ALPRAZolam (XANAX) 0.5 MG tablet Take 0.5 mg by mouth 4 (four) times daily as needed. For anxiety    . calcium carbonate (CALCIUM 600) 1500 (600 Ca) MG TABS tablet Take 600 mg of elemental calcium by mouth 2 (two) times daily with a meal.    . cholecalciferol (VITAMIN D) 400 units TABS tablet Take 400 Units by mouth daily.    . cyclobenzaprine (FLEXERIL) 10 MG tablet Take 10 mg by mouth 3 (three) times daily as needed. For muscle spasms    .  diphenhydrAMINE (BENADRYL) 25 mg capsule Take 25 mg by mouth every 6 (six) hours as needed.    . IBU 800 MG tablet Take 800 mg by mouth 2 (two) times daily as needed for mild pain or moderate pain.     . Multiple Vitamins-Minerals (MULTIVITAMIN WOMEN 50+ PO) Take 1 tablet by mouth daily.    . Polysaccharide Iron Complex (POLY-IRON 150 PO) Take by mouth as needed.    . venlafaxine XR (EFFEXOR-XR) 150 MG 24 hr capsule Take 150 mg by mouth daily with breakfast.     No current facility-administered medications for this visit.      Allergies as of 04/16/2018  . (No Known Allergies)    Family History  Problem Relation Age of Onset  . Hypertension Mother   . Colon cancer Neg Hx   . Gastric cancer Neg Hx   . Esophageal cancer Neg Hx     Social History   Socioeconomic History  . Marital status: Married    Spouse name: Not on file  . Number of children: Not on file  . Years of education: Not on file  . Highest education level: Not on file  Occupational History  . Not on file  Social Needs  . Financial resource strain: Not on file  . Food insecurity:    Worry: Not on file    Inability: Not on file  . Transportation needs:    Medical: Not on file    Non-medical: Not on file  Tobacco Use  . Smoking status: Never Smoker  . Smokeless tobacco: Never Used  Substance and Sexual Activity  . Alcohol use: No  . Drug use: No  . Sexual activity: Yes    Birth control/protection: Surgical, Post-menopausal  Lifestyle  . Physical activity:    Days per week: Not on file    Minutes per session: Not on file  . Stress: Not on file  Relationships  . Social connections:    Talks on phone: Not on file    Gets together: Not on file    Attends religious service: Not on file    Active member of club or organization: Not on file    Attends meetings of clubs or organizations: Not on file    Relationship status: Not on file  . Intimate partner violence:    Fear of current or ex partner: Not on file    Emotionally abused: Not on file    Physically abused: Not on file    Forced sexual activity: Not on file  Other Topics Concern  . Not on file  Social History Narrative  . Not on file    Review of Systems: Complete ROS negative except as per HPI.    Physical Exam: BP 125/86   Pulse 85   Temp (!) 97.1 F (36.2 C) (Oral)   Ht 5\' 7"  (1.702 m)   Wt 157 lb 6.4 oz (71.4 kg)   LMP 12/06/2013 Comment: pt states she had a surgery and they inserted a balloon to slow her periods down. she has a light period every  month  BMI 24.65 kg/m  General:   Alert and oriented. Pleasant and cooperative. Well-nourished and well-developed.  Eyes:  Without icterus, sclera clear and conjunctiva pink.  Ears:  Normal auditory acuity. Cardiovascular:  S1, S2 present without murmurs appreciated. Extremities without clubbing or edema. Respiratory:  Clear to auscultation bilaterally. No wheezes, rales, or rhonchi. No distress.  Gastrointestinal:  +BS, soft, and non-distended. Mild generalized TTP. No  HSM noted. No guarding or rebound. No masses appreciated.  Rectal:  Deferred  Musculoskalatal:  Symmetrical without gross deformities. Skin:  Intact without significant lesions or rashes. Neurologic:  Alert and oriented x4;  grossly normal neurologically. Psych:  Alert and cooperative. Normal mood and affect. Heme/Lymph/Immune: No excessive bruising noted.    04/16/2018 12:27 PM   Disclaimer: This note was dictated with voice recognition software. Similar sounding words can inadvertently be transcribed and may not be corrected upon review.

## 2018-04-16 NOTE — Assessment & Plan Note (Signed)
The patient is currently due for 10-year repeat colonoscopy.  She does have constipation, as per above.  Otherwise no complaints.  She was brought in for an office visit due to polypharmacy.  We will proceed with colonoscopy at this time.  Proceed with colonoscopy on propofol/MAC with Dr. Oneida Alar in the near future. The risks, benefits, and alternatives have been discussed in detail with the patient. They state understanding and desire to proceed.   The patient is currently on Xanax 4 times a day as needed, Flexeril, Effexor 150 mg daily.  No other anticoagulants, anxiolytics, chronic pain medications, or antidepressants.  We will plan for the procedure on propofol/MAC to promote adequate sedation.

## 2018-04-17 ENCOUNTER — Telehealth: Payer: Self-pay

## 2018-04-17 NOTE — Telephone Encounter (Signed)
Called and informed pt of pre-op appt 06/20/18 at 9:00am. Letter mailed.

## 2018-04-17 NOTE — Progress Notes (Signed)
CC'D TO PCP °

## 2018-04-18 ENCOUNTER — Other Ambulatory Visit (HOSPITAL_COMMUNITY): Payer: Self-pay | Admitting: Internal Medicine

## 2018-04-18 DIAGNOSIS — Z853 Personal history of malignant neoplasm of breast: Secondary | ICD-10-CM

## 2018-04-20 DIAGNOSIS — Z1389 Encounter for screening for other disorder: Secondary | ICD-10-CM | POA: Diagnosis not present

## 2018-04-20 DIAGNOSIS — R002 Palpitations: Secondary | ICD-10-CM | POA: Diagnosis not present

## 2018-04-20 DIAGNOSIS — Z6822 Body mass index (BMI) 22.0-22.9, adult: Secondary | ICD-10-CM | POA: Diagnosis not present

## 2018-04-24 ENCOUNTER — Telehealth: Payer: Self-pay | Admitting: Gastroenterology

## 2018-04-24 ENCOUNTER — Encounter (HOSPITAL_COMMUNITY): Payer: Self-pay

## 2018-04-24 ENCOUNTER — Ambulatory Visit (HOSPITAL_COMMUNITY)
Admission: RE | Admit: 2018-04-24 | Discharge: 2018-04-24 | Disposition: A | Discharge status: Home / Self Care | Payer: BLUE CROSS/BLUE SHIELD | Source: Ambulatory Visit | Attending: Internal Medicine | Admitting: Internal Medicine

## 2018-04-24 DIAGNOSIS — R922 Inconclusive mammogram: Secondary | ICD-10-CM | POA: Diagnosis not present

## 2018-04-24 DIAGNOSIS — Z853 Personal history of malignant neoplasm of breast: Secondary | ICD-10-CM | POA: Diagnosis not present

## 2018-04-24 DIAGNOSIS — K5900 Constipation, unspecified: Secondary | ICD-10-CM

## 2018-04-24 DIAGNOSIS — C50419 Malignant neoplasm of upper-outer quadrant of unspecified female breast: Secondary | ICD-10-CM | POA: Insufficient documentation

## 2018-04-24 HISTORY — DX: Malignant neoplasm of unspecified site of unspecified female breast: C50.919

## 2018-04-24 NOTE — Telephone Encounter (Signed)
Pt used her Linzess samples and needs a prescription called into [facility].

## 2018-04-24 NOTE — Telephone Encounter (Signed)
Forwarding to Eric Gill, NP who saw pt in the office.  

## 2018-04-25 ENCOUNTER — Telehealth: Payer: Self-pay | Admitting: Gastroenterology

## 2018-04-25 MED ORDER — LINACLOTIDE 72 MCG PO CAPS: 72.0000 ug | ORAL_CAPSULE | Freq: Every day | ORAL | 3 refills | Status: DC

## 2018-04-25 NOTE — Telephone Encounter (Signed)
Eric, please note pt has called back and ask for a 90 day supply of the Linzess be sent to the pharmacy.

## 2018-04-25 NOTE — Telephone Encounter (Signed)
LMOM that Rx has been sent in and to call if any problems or questions.

## 2018-04-25 NOTE — Telephone Encounter (Signed)
Pt had called yesterday to have a prescription of Linzess called into [facility] since the samples had helped her. She called again this morning saying nothing has been called in yet and could we make it for a 90 day supply.

## 2018-04-25 NOTE — Telephone Encounter (Signed)
I added to the message yesterday that pt would like to get 90 day supply.

## 2018-04-25 NOTE — Addendum Note (Signed)
Addended by: Gordy Levan, ERIC A on: 04/25/2018 04:12 PM   Modules accepted: Orders

## 2018-04-25 NOTE — Telephone Encounter (Signed)
Rx sent per patient request  

## 2018-05-25 DIAGNOSIS — Z1389 Encounter for screening for other disorder: Secondary | ICD-10-CM | POA: Diagnosis not present

## 2018-05-25 DIAGNOSIS — Z23 Encounter for immunization: Secondary | ICD-10-CM | POA: Diagnosis not present

## 2018-05-25 DIAGNOSIS — K219 Gastro-esophageal reflux disease without esophagitis: Secondary | ICD-10-CM | POA: Diagnosis not present

## 2018-05-25 DIAGNOSIS — Z6823 Body mass index (BMI) 23.0-23.9, adult: Secondary | ICD-10-CM | POA: Diagnosis not present

## 2018-05-25 DIAGNOSIS — G4700 Insomnia, unspecified: Secondary | ICD-10-CM | POA: Diagnosis not present

## 2018-05-30 ENCOUNTER — Telehealth: Payer: Self-pay | Admitting: Gastroenterology

## 2018-05-30 NOTE — Telephone Encounter (Signed)
Pt was seen in the office on 04/16/2018 by Walden Field, NP for constipation. She was given samples of Linzess 72 mcg to take daily. She had called back because it worked and asked for a prescription. She said she had taken it up until 4 days ago and she was having diarrhea so she stopped. Then she said she could not have a BM.  She is not having any abdominal pain, just bloating.  She was miserable last night and took mag citrate and that did not help her. She then used an enema and it helped some, but she doesn't feel cleaned out.  She doesn't know what to do, afraid of diarrhea if she starts back on the Linzess. Randall Hiss, please advise!

## 2018-05-30 NOTE — Telephone Encounter (Signed)
Let's retest the [place]. If it causes diarrhea again, call us and we can try a different medication (such as Amitiza)

## 2018-05-30 NOTE — Telephone Encounter (Signed)
Pt is aware.  

## 2018-05-30 NOTE — Telephone Encounter (Signed)
Pt said she can't get her bowels to move and has tried mag citrate and enemas and nothing is working. She said she is uncomfortable and bloated. She thinks it's something with her intestines. She already has an OV and procedure scheduled with [facility]. Please advise and call her at [phone]

## 2018-06-14 ENCOUNTER — Telehealth: Payer: Self-pay | Admitting: Gastroenterology

## 2018-06-14 DIAGNOSIS — R140 Abdominal distension (gaseous): Secondary | ICD-10-CM

## 2018-06-14 NOTE — Telephone Encounter (Signed)
Continue the Linzess.  Does it feel like she fills up too quick when she eats? If so, we can do a GES on her. She has a colonoscopy coming up which can help evaluate further as well.

## 2018-06-14 NOTE — Telephone Encounter (Signed)
Pt will continue Linzess. Yes, pt feels her stomach filling up quickly after eating and says she will look 7 months pregnant.   Please schedule testing recommended by EG.

## 2018-06-14 NOTE — Telephone Encounter (Signed)
PATIENT CALLED AND SAID THE MEDICATION HE GAVE FOR CONSTIPATION IS GOOD, BUT SHE IS STILL HAVING PROBLEMS WITH HER STOMACH.  [place] TO SPEAK TO SOMEONE TO FIND OUT WHAT ELSE SHE CAN DO

## 2018-06-14 NOTE — Telephone Encounter (Signed)
Spoke with pt. She is taking the Linzess 72 mcg and feel that her bowels are moving twice daily. She has noticed some abdomen swelling/bloating that is present anytime she eats and stays swollen until she is able to have a bowel movement. Pt states she feels like something moves around in her stomach and the swelling is what she is concerned about.

## 2018-06-15 NOTE — Telephone Encounter (Signed)
GES scheduled for 06/18/18 at 10:00am, Arrival time 9:30am. NPO after midnight, no stomach medications after midnight.   Patient is aware of appt details.

## 2018-06-15 NOTE — Patient Instructions (Signed)
[name]  06/15/2018     @PREFPERIOPPHARMACY @   Your procedure is scheduled on  06/26/2018 .  Report to [facility] at  945  A.M.  Call this number if you have problems the morning of surgery:  [phone]   Remember:  Do not eat or drink after midnight.  You may drink clear liquids until (follow the instructions given to you).  Clear liquids allowed are:                    Water, Juice (non-citric and without pulp), Carbonated beverages, Clear Tea, Black Coffee only, Plain Jell-O only, Gatorade and Plain Popsicles only    Take these medicines the morning of surgery with A SIP OF WATER  Xanax ( if needed), elavil, flexaril ( if needed), effexor.    Do not wear jewelry, make-up or nail polish.  Do not wear lotions, powders, or perfumes, or deodorant.  Do not shave 48 hours prior to surgery.  Men may shave face and neck.  Do not bring valuables to the hospital.  [facility] is not responsible for any belongings or valuables.  Contacts, dentures or bridgework may not be worn into surgery.  Leave your suitcase in the car.  After surgery it may be brought to your room.  For patients admitted to the hospital, discharge time will be determined by your treatment team.  Patients discharged the day of surgery will not be allowed to drive home.   Name and phone number of your driver:   family Special instructions:   Follow the diet and prep instructions given to you by Dr Nona Dell office. Please read over the following fact sheets that you were given. Anesthesia Post-op Instructions and Care and Recovery After Surgery       Colonoscopy, Adult A colonoscopy is an exam to look at the large intestine. It is done to check for problems, such as:  Lumps (tumors).  Growths (polyps).  Swelling (inflammation).  Bleeding.  What happens before the procedure? Eating and drinking Follow instructions from your doctor about eating and drinking. These instructions may  include:  A few days before the procedure - follow a low-fiber diet. ? Avoid nuts. ? Avoid seeds. ? Avoid dried fruit. ? Avoid raw fruits. ? Avoid vegetables.  1-3 days before the procedure - follow a clear liquid diet. Avoid liquids that have red or purple dye. Drink only clear liquids, such as: ? Clear broth or bouillon. ? Black coffee or tea. ? Clear juice. ? Clear soft drinks or sports drinks. ? Gelatin dessert. ? Popsicles.  On the day of the procedure - do not eat or drink anything during the 2 hours before the procedure.  Bowel prep If you were prescribed an oral bowel prep:  Take it as told by your doctor. Starting the day before your procedure, you will need to drink a lot of liquid. The liquid will cause you to poop (have bowel movements) until your poop is almost clear or light green.  If your skin or butt gets irritated from diarrhea, you may: ? Wipe the area with wipes that have medicine in them, such as adult wet wipes with aloe and vitamin E. ? Put something on your skin that soothes the area, such as petroleum jelly.  If you throw up (vomit) while drinking the bowel prep, take a break for up to 60 minutes. Then begin the bowel prep again. If  you keep throwing up and you cannot take the bowel prep without throwing up, call your doctor.  General instructions  Ask your doctor about changing or stopping your normal medicines. This is important if you take diabetes medicines or blood thinners.  Plan to have someone take you home from the hospital or clinic. What happens during the procedure?  An IV tube may be put into one of your veins.  You will be given medicine to help you relax (sedative).  To reduce your risk of infection: ? Your doctors will wash their hands. ? Your anal area will be washed with soap.  You will be asked to lie on your side with your knees bent.  Your doctor will get a long, thin, flexible tube ready. The tube will have a camera and a  light on the end.  The tube will be put into your anus.  The tube will be gently put into your large intestine.  Air will be delivered into your large intestine to keep it open. You may feel some pressure or cramping.  The camera will be used to take photos.  A small tissue sample may be removed from your body to be looked at under a microscope (biopsy). If any possible problems are found, the tissue will be sent to a lab for testing.  If small growths are found, your doctor may remove them and have them checked for cancer.  The tube that was put into your anus will be slowly removed. The procedure may vary among doctors and hospitals. What happens after the procedure?  Your doctor will check on you often until the medicines you were given have worn off.  Do not drive for 24 hours after the procedure.  You may have a small amount of blood in your poop.  You may pass gas.  You may have mild cramps or bloating in your belly (abdomen).  It is up to you to get the results of your procedure. Ask your doctor, or the department performing the procedure, when your results will be ready. This information is not intended to replace advice given to you by your health care provider. Make sure you discuss any questions you have with your health care provider. Document Released: 09/24/2010 Document Revised: 06/22/2016 Document Reviewed: 11/03/2015 Elsevier Interactive Patient Education  2017 [employer]  Colonoscopy, Adult, Care After This sheet gives you information about how to care for yourself after your procedure. Your health care provider may also give you more specific instructions. If you have problems or questions, contact your health care provider. What can I expect after the procedure? After the procedure, it is common to have:  A small amount of blood in your stool for 24 hours after the procedure.  Some gas.  Mild abdominal cramping or bloating.  Follow these  instructions at home: General instructions   For the first 24 hours after the procedure: ? Do not drive or use machinery. ? Do not sign important documents. ? Do not drink alcohol. ? Do your regular daily activities at a slower pace than normal. ? Eat soft, easy-to-digest foods. ? Rest often.  Take over-the-counter or prescription medicines only as told by your health care provider.  It is up to you to get the results of your procedure. Ask your health care provider, or the department performing the procedure, when your results will be ready. Relieving cramping and bloating  Try walking around when you have cramps or feel bloated.  Apply heat  to your abdomen as told by your health care provider. Use a heat source that your health care provider recommends, such as a moist heat pack or a heating pad. ? Place a towel between your skin and the heat source. ? Leave the heat on for 20-30 minutes. ? Remove the heat if your skin turns bright red. This is especially important if you are unable to feel pain, heat, or cold. You may have a greater risk of getting burned. Eating and drinking  Drink enough fluid to keep your urine clear or pale yellow.  Resume your normal diet as instructed by your health care provider. Avoid heavy or fried foods that are hard to digest.  Avoid drinking alcohol for as long as instructed by your health care provider. Contact a health care provider if:  You have blood in your stool 2-3 days after the procedure. Get help right away if:  You have more than a small spotting of blood in your stool.  You pass large blood clots in your stool.  Your abdomen is swollen.  You have nausea or vomiting.  You have a fever.  You have increasing abdominal pain that is not relieved with medicine. This information is not intended to replace advice given to you by your health care provider. Make sure you discuss any questions you have with your health care  provider. Document Released: 04/05/2004 Document Revised: 05/16/2016 Document Reviewed: 11/03/2015 Elsevier Interactive Patient Education  2018 [place] Anesthesia is a term that refers to techniques, procedures, and medicines that help a person stay safe and comfortable during a medical procedure. Monitored anesthesia care, or sedation, is one type of anesthesia. Your anesthesia specialist may recommend sedation if you will be having a procedure that does not require you to be unconscious, such as:  Cataract surgery.  A dental procedure.  A biopsy.  A colonoscopy.  During the procedure, you may receive a medicine to help you relax (sedative). There are three levels of sedation:  Mild sedation. At this level, you may feel awake and relaxed. You will be able to follow directions.  Moderate sedation. At this level, you will be sleepy. You may not remember the procedure.  Deep sedation. At this level, you will be asleep. You will not remember the procedure.  The more medicine you are given, the deeper your level of sedation will be. Depending on how you respond to the procedure, the anesthesia specialist may change your level of sedation or the type of anesthesia to fit your needs. An anesthesia specialist will monitor you closely during the procedure. Let your health care provider know about:  Any allergies you have.  All medicines you are taking, including vitamins, herbs, eye drops, creams, and over-the-counter medicines.  Any use of steroids (by mouth or as a cream).  Any problems you or family members have had with sedatives and anesthetic medicines.  Any blood disorders you have.  Any surgeries you have had.  Any medical conditions you have, such as sleep apnea.  Whether you are pregnant or may be pregnant.  Any use of cigarettes, alcohol, or street drugs. What are the risks? Generally, this is a safe procedure. However, problems may  occur, including:  Getting too much medicine (oversedation).  Nausea.  Allergic reaction to medicines.  Trouble breathing. If this happens, a breathing tube may be used to help with breathing. It will be removed when you are awake and breathing on your own.  Heart  trouble.  Lung trouble.  Before the procedure Staying hydrated Follow instructions from your health care provider about hydration, which may include:  Up to 2 hours before the procedure - you may continue to drink clear liquids, such as water, clear fruit juice, black coffee, and plain tea.  Eating and drinking restrictions Follow instructions from your health care provider about eating and drinking, which may include:  8 hours before the procedure - stop eating heavy meals or foods such as meat, fried foods, or fatty foods.  6 hours before the procedure - stop eating light meals or foods, such as toast or cereal.  6 hours before the procedure - stop drinking milk or drinks that contain milk.  2 hours before the procedure - stop drinking clear liquids.  Medicines Ask your health care provider about:  Changing or stopping your regular medicines. This is especially important if you are taking diabetes medicines or blood thinners.  Taking medicines such as aspirin and ibuprofen. These medicines can thin your blood. Do not take these medicines before your procedure if your health care provider instructs you not to.  Tests and exams  You will have a physical exam.  You may have blood tests done to show: ? How well your kidneys and liver are working. ? How well your blood can clot.  General instructions  Plan to have someone take you home from the hospital or clinic.  If you will be going home right after the procedure, plan to have someone with you for 24 hours.  What happens during the procedure?  Your blood pressure, heart rate, breathing, level of pain and overall condition will be monitored.  An IV  tube will be inserted into one of your veins.  Your anesthesia specialist will give you medicines as needed to keep you comfortable during the procedure. This may mean changing the level of sedation.  The procedure will be performed. After the procedure  Your blood pressure, heart rate, breathing rate, and blood oxygen level will be monitored until the medicines you were given have worn off.  Do not drive for 24 hours if you received a sedative.  You may: ? Feel sleepy, clumsy, or nauseous. ? Feel forgetful about what happened after the procedure. ? Have a sore throat if you had a breathing tube during the procedure. ? Vomit. This information is not intended to replace advice given to you by your health care provider. Make sure you discuss any questions you have with your health care provider. Document Released: 05/18/2005 Document Revised: 01/29/2016 Document Reviewed: 12/13/2015 Elsevier Interactive Patient Education  2018 [place], Care After These instructions provide you with information about caring for yourself after your procedure. Your health care provider may also give you more specific instructions. Your treatment has been planned according to current medical practices, but problems sometimes occur. Call your health care provider if you have any problems or questions after your procedure. What can I expect after the procedure? After your procedure, it is common to:  Feel sleepy for several hours.  Feel clumsy and have poor balance for several hours.  Feel forgetful about what happened after the procedure.  Have poor judgment for several hours.  Feel nauseous or vomit.  Have a sore throat if you had a breathing tube during the procedure.  Follow these instructions at home: For at least 24 hours after the procedure:   Do not: ? Participate in activities in which you could fall or  become injured. ? Drive. ? Use heavy  machinery. ? Drink alcohol. ? Take sleeping pills or medicines that cause drowsiness. ? Make important decisions or sign legal documents. ? Take care of children on your own.  Rest. Eating and drinking  Follow the diet that is recommended by your health care provider.  If you vomit, drink water, juice, or soup when you can drink without vomiting.  Make sure you have little or no nausea before eating solid foods. General instructions  Have a responsible adult stay with you until you are awake and alert.  Take over-the-counter and prescription medicines only as told by your health care provider.  If you smoke, do not smoke without supervision.  Keep all follow-up visits as told by your health care provider. This is important. Contact a health care provider if:  You keep feeling nauseous or you keep vomiting.  You feel light-headed.  You develop a rash.  You have a fever. Get help right away if:  You have trouble breathing. This information is not intended to replace advice given to you by your health care provider. Make sure you discuss any questions you have with your health care provider. Document Released: 12/13/2015 Document Revised: 04/13/2016 Document Reviewed: 12/13/2015 Elsevier Interactive Patient Education  [employer].

## 2018-06-15 NOTE — Addendum Note (Signed)
Addended by: Inge Rise on: 06/15/2018 07:47 AM   Modules accepted: Orders

## 2018-06-18 ENCOUNTER — Encounter (HOSPITAL_COMMUNITY): Payer: Self-pay

## 2018-06-18 ENCOUNTER — Encounter (HOSPITAL_COMMUNITY)
Admission: RE | Admit: 2018-06-18 | Discharge: 2018-06-18 | Disposition: A | Discharge status: Home / Self Care | Payer: BLUE CROSS/BLUE SHIELD | Source: Ambulatory Visit | Attending: Nurse Practitioner | Admitting: Nurse Practitioner

## 2018-06-18 DIAGNOSIS — R140 Abdominal distension (gaseous): Secondary | ICD-10-CM | POA: Insufficient documentation

## 2018-06-18 MED ORDER — TECHNETIUM TC 99M SULFUR COLLOID
2.0000 | Freq: Once | INTRAVENOUS | Status: AC | PRN
  Administered 2018-06-18: 2.1 via ORAL

## 2018-06-20 ENCOUNTER — Encounter (HOSPITAL_COMMUNITY): Payer: Self-pay

## 2018-06-20 ENCOUNTER — Encounter (HOSPITAL_COMMUNITY)
Admission: RE | Admit: 2018-06-20 | Discharge: 2018-06-20 | Disposition: A | Discharge status: Home / Self Care | Payer: BLUE CROSS/BLUE SHIELD | Source: Ambulatory Visit | Attending: Gastroenterology | Admitting: Gastroenterology

## 2018-06-20 ENCOUNTER — Other Ambulatory Visit: Payer: Self-pay

## 2018-06-20 DIAGNOSIS — Z01812 Encounter for preprocedural laboratory examination: Secondary | ICD-10-CM | POA: Diagnosis not present

## 2018-06-20 LAB — BASIC METABOLIC PANEL
Anion gap: 9 (ref 5–15)
BUN: 11 mg/dL (ref 6–20)
CHLORIDE: 104 mmol/L (ref 98–111)
CO2: 24 mmol/L (ref 22–32)
CREATININE: 0.57 mg/dL (ref 0.44–1.00)
Calcium: 9.4 mg/dL (ref 8.9–10.3)
GFR calc Af Amer: >60 mL/min (ref >60)
GFR calc non Af Amer: >60 mL/min (ref >60)
Glucose, Bld: 91 mg/dL (ref 70–99)
POTASSIUM: 3.9 mmol/L (ref 3.5–5.1)
SODIUM: 137 mmol/L (ref 135–145)

## 2018-06-20 LAB — CBC WITH DIFFERENTIAL/PLATELET
ABS IMMATURE GRANULOCYTES: 0.01 10*3/uL (ref 0.00–0.07)
Basophils Absolute: 0 10*3/uL (ref 0.0–0.1)
Basophils Relative: 1 %
EOS PCT: 2 %
Eosinophils Absolute: 0.1 10*3/uL (ref 0.0–0.5)
HEMATOCRIT: 37.9 % (ref 36.0–46.0)
HEMOGLOBIN: 11.9 g/dL — AB (ref 12.0–15.0)
Immature Granulocytes: 0 %
LYMPHS ABS: 1.2 10*3/uL (ref 0.7–4.0)
LYMPHS PCT: 30 %
MCH: 29.9 pg (ref 26.0–34.0)
MCHC: 31.4 g/dL (ref 30.0–36.0)
MCV: 95.2 fL (ref 80.0–100.0)
MONO ABS: 0.5 10*3/uL (ref 0.1–1.0)
MONOS PCT: 12 %
NEUTROS ABS: 2.3 10*3/uL (ref 1.7–7.7)
Neutrophils Relative %: 55 %
Platelets: 315 10*3/uL (ref 150–400)
RBC: 3.98 MIL/uL (ref 3.87–5.11)
RDW: 13.6 % (ref 11.5–15.5)
WBC: 4.1 10*3/uL (ref 4.0–10.5)
nRBC: 0 % (ref 0.0–0.2)

## 2018-06-21 NOTE — Progress Notes (Signed)
CC'D TO PCP °

## 2018-06-26 ENCOUNTER — Ambulatory Visit (HOSPITAL_COMMUNITY): Payer: BLUE CROSS/BLUE SHIELD | Admitting: Anesthesiology

## 2018-06-26 ENCOUNTER — Encounter (HOSPITAL_COMMUNITY): Payer: Self-pay | Admitting: Anesthesiology

## 2018-06-26 ENCOUNTER — Encounter (HOSPITAL_COMMUNITY)
Admission: RE | Disposition: A | Discharge status: Home / Self Care | Payer: Self-pay | Source: Ambulatory Visit | Attending: Gastroenterology

## 2018-06-26 ENCOUNTER — Ambulatory Visit (HOSPITAL_COMMUNITY)
Admission: RE | Admit: 2018-06-26 | Discharge: 2018-06-26 | Disposition: A | Discharge status: Home / Self Care | Payer: BLUE CROSS/BLUE SHIELD | Source: Ambulatory Visit | Attending: Gastroenterology | Admitting: Gastroenterology

## 2018-06-26 DIAGNOSIS — K644 Residual hemorrhoidal skin tags: Secondary | ICD-10-CM | POA: Insufficient documentation

## 2018-06-26 DIAGNOSIS — Z853 Personal history of malignant neoplasm of breast: Secondary | ICD-10-CM | POA: Insufficient documentation

## 2018-06-26 DIAGNOSIS — D122 Benign neoplasm of ascending colon: Secondary | ICD-10-CM | POA: Insufficient documentation

## 2018-06-26 DIAGNOSIS — Q438 Other specified congenital malformations of intestine: Secondary | ICD-10-CM | POA: Insufficient documentation

## 2018-06-26 DIAGNOSIS — Z1211 Encounter for screening for malignant neoplasm of colon: Secondary | ICD-10-CM

## 2018-06-26 DIAGNOSIS — K635 Polyp of colon: Secondary | ICD-10-CM | POA: Diagnosis not present

## 2018-06-26 DIAGNOSIS — Z79899 Other long term (current) drug therapy: Secondary | ICD-10-CM | POA: Insufficient documentation

## 2018-06-26 DIAGNOSIS — Z9012 Acquired absence of left breast and nipple: Secondary | ICD-10-CM | POA: Diagnosis not present

## 2018-06-26 DIAGNOSIS — F329 Major depressive disorder, single episode, unspecified: Secondary | ICD-10-CM | POA: Insufficient documentation

## 2018-06-26 HISTORY — DX: Congenital malformations of intestinal fixation: Q43.3

## 2018-06-26 HISTORY — PX: POLYPECTOMY: SHX5525

## 2018-06-26 HISTORY — PX: COLONOSCOPY WITH PROPOFOL: SHX5780

## 2018-06-26 SURGERY — COLONOSCOPY WITH PROPOFOL
Anesthesia: General

## 2018-06-26 MED ORDER — HYDROMORPHONE HCL 1 MG/ML IJ SOLN: 0.2500 mg | INTRAMUSCULAR | Status: DC | PRN

## 2018-06-26 MED ORDER — PROPOFOL 500 MG/50ML IV EMUL
INTRAVENOUS | Status: DC | PRN
  Administered 2018-06-26: 150 ug/kg/min via INTRAVENOUS
  Administered 2018-06-26: 100 ug/kg/min via INTRAVENOUS

## 2018-06-26 MED ORDER — EPHEDRINE SULFATE 50 MG/ML IJ SOLN
INTRAMUSCULAR | Status: DC | PRN
  Administered 2018-06-26: 5 mg via INTRAVENOUS

## 2018-06-26 MED ORDER — HYDROCODONE-ACETAMINOPHEN 7.5-325 MG PO TABS: 1.0000 | ORAL_TABLET | Freq: Once | ORAL | Status: DC | PRN

## 2018-06-26 MED ORDER — LACTATED RINGERS IV SOLN
INTRAVENOUS | Status: DC
  Administered 2018-06-26: 10:00:00 via INTRAVENOUS

## 2018-06-26 MED ORDER — PROMETHAZINE HCL 25 MG/ML IJ SOLN: 6.2500 mg | INTRAMUSCULAR | Status: DC | PRN

## 2018-06-26 MED ORDER — MIDAZOLAM HCL 2 MG/2ML IJ SOLN: 0.5000 mg | Freq: Once | INTRAMUSCULAR | Status: DC | PRN

## 2018-06-26 MED ORDER — PROPOFOL 10 MG/ML IV BOLUS
INTRAVENOUS | Status: DC | PRN
  Administered 2018-06-26 (×2): 40 mg via INTRAVENOUS
  Administered 2018-06-26: 20 mg via INTRAVENOUS

## 2018-06-26 MED ORDER — CHLORHEXIDINE GLUCONATE CLOTH 2 % EX PADS: 6.0000 | MEDICATED_PAD | Freq: Once | CUTANEOUS | Status: DC

## 2018-06-26 NOTE — H&P (Signed)
Primary Care Physician:  Redmond School, MD Primary Gastroenterologist:  Dr. Oneida Alar  Pre-Procedure History & Physical: HPI:  [name] is a 57 y.o. female here for [place].  Past Medical History:  Diagnosis Date  . Anemia   . Arthritis   . Breast cancer ([place])    right breast cancer 5 yrs ago  . Cancer [facility])     right breast cancer  . Chronic back pain   . Chronic neck pain   . Depression   . Dysrhythmia    hx palpitations-too much caffiene  . Headache   . Wears glasses     Past Surgical History:  Procedure Laterality Date  . ANKLE SURGERY     left ankle cyst  . BACK SURGERY  2000   lumbar disc surgery  . BREAST EXCISIONAL BIOPSY Left    benign  . BREAST SURGERY     left partail mastectomy-cyst  . CERVICAL DISC SURGERY  1999   anterior and a posterier cerv fusionx2  . CESAREAN SECTION     x2  . COLONOSCOPY    . DILATION AND CURETTAGE OF UTERUS    . DILITATION & CURRETTAGE/HYSTROSCOPY WITH THERMACHOICE ABLATION  07/10/2012   Procedure: DILATATION & CURETTAGE/HYSTEROSCOPY WITH THERMACHOICE ABLATION;  Surgeon: [name], MD;  Location: [facility];  Service: Gynecology;  Laterality: N/A;  D5 17ml in, 13 ml out, temp 87degree celcius, total therapy time 67min 16sec  . ERCP    . TUBAL LIGATION     with last c-section    Prior to Admission medications   Medication Sig Start Date End Date Taking? Authorizing Provider  ALPRAZolam Duanne Moron) 0.5 MG tablet Take 0.5 mg by mouth 2 (two) times daily as needed for anxiety.    Yes [provider]  amitriptyline (ELAVIL) 50 MG tablet Take 50 mg by mouth every other day.   Yes [provider]  calcium carbonate (CALCIUM 600) 1500 (600 Ca) MG TABS tablet Take 600 mg of elemental calcium by mouth daily with breakfast.    Yes [provider]  cyclobenzaprine (FLEXERIL) 10 MG tablet Take 10 mg by mouth 2 (two) times daily.    Yes [provider]  IBU 800 MG tablet Take 800 mg by mouth 2  (two) times daily as needed for moderate pain.  02/01/18  Yes [provider]  linaclotide Rolan Lipa) 72 MCG capsule Take 1 capsule (72 mcg total) by mouth daily before breakfast. 04/25/18  Yes Carlis Stable, NP  Multiple Vitamins-Minerals (MULTIVITAMIN WOMEN 50+ PO) Take 1 tablet by mouth daily.   Yes [provider]  diphenhydrAMINE (BENADRYL) 25 mg capsule Take 25 mg by mouth every 6 (six) hours as needed.    [provider]  iron polysaccharides (POLY-IRON 150) 150 MG capsule Take 150 mg by mouth daily as needed (low iron).     [provider]  venlafaxine XR (EFFEXOR-XR) 150 MG 24 hr capsule Take 150 mg by mouth daily with breakfast.    [provider]    Allergies as of 04/16/2018  . (No Known Allergies)    Family History  Problem Relation Age of Onset  . Hypertension Mother   . Colon cancer Neg Hx   . Gastric cancer Neg Hx   . Esophageal cancer Neg Hx     Social History   Socioeconomic History  . Marital status: Married    Spouse name: Not on file  . Number of children: Not on file  .  Years of education: Not on file  . Highest education level: Not on file  Occupational History  . Not on file  Social Needs  . Financial resource strain: Not on file  . Food insecurity:    Worry: Not on file    Inability: Not on file  . Transportation needs:    Medical: Not on file    Non-medical: Not on file  Tobacco Use  . Smoking status: Never Smoker  . Smokeless tobacco: Never Used  Substance and Sexual Activity  . Alcohol use: No  . Drug use: No  . Sexual activity: Yes    Birth control/protection: Surgical, Post-menopausal  Lifestyle  . Physical activity:    Days per week: Not on file    Minutes per session: Not on file  . Stress: Not on file  Relationships  . Social connections:    Talks on phone: Not on file    Gets together: Not on file    Attends religious service: Not on file    Active member of club or organization: Not on  file    Attends meetings of clubs or organizations: Not on file    Relationship status: Not on file  . Intimate partner violence:    Fear of current or ex partner: Not on file    Emotionally abused: Not on file    Physically abused: Not on file    Forced sexual activity: Not on file  Other Topics Concern  . Not on file  Social History Narrative  . Not on file    Review of Systems: See HPI, otherwise negative ROS   Physical Exam: LMP 12/06/2013 Comment: pt states she had a surgery and they inserted a balloon to slow her periods down. she has a light period every month General:   Alert,  pleasant and cooperative in NAD Head:  Normocephalic and atraumatic. Neck:  Supple; Lungs:  Clear throughout to auscultation.    Heart:  Regular rate and rhythm. Abdomen:  Soft, nontender and nondistended. Normal bowel sounds, without guarding, and without rebound.   Neurologic:  Alert and  oriented x4;  grossly normal neurologically.  Impression/Plan:    SCREENING  Plan:  1. TCS TODAY DISCUSSED PROCEDURE, BENEFITS, & RISKS: < 1% chance of medication reaction, bleeding, perforation, or rupture of spleen/liver.

## 2018-06-26 NOTE — Transfer of Care (Addendum)
Immediate Anesthesia Transfer of Care Note  Patient: [name]  Procedure(s) Performed: COLONOSCOPY WITH PROPOFOL (N/A ) POLYPECTOMY  Patient Location: PACU  Anesthesia Type:General  Level of Consciousness: awake and patient cooperative  Airway & Oxygen Therapy: Patient Spontanous Breathing  Post-op Assessment: Report given to RN and Post -op Vital signs reviewed and stable  Post vital signs: Reviewed and stable  Last Vitals:  Vitals Value Taken Time  BP    Temp    Pulse 79 06/26/2018 11:11 AM  Resp 19 06/26/2018 11:11 AM  SpO2 96 % 06/26/2018 11:11 AM  Vitals shown include unvalidated device data.  Last Pain:  Vitals:   06/26/18 1043  TempSrc:   PainSc: 6       Patients Stated Pain Goal: 5 (72/09/47 0962)  Complications: No apparent anesthesia complications

## 2018-06-26 NOTE — Anesthesia Postprocedure Evaluation (Addendum)
Anesthesia Post Note  Patient: [name]  Procedure(s) Performed: COLONOSCOPY WITH PROPOFOL (N/A ) POLYPECTOMY  Patient location during evaluation: PACU Anesthesia Type: General Level of consciousness: awake and alert and patient cooperative Pain management: satisfactory to patient Vital Signs Assessment: post-procedure vital signs reviewed and stable Respiratory status: spontaneous breathing Cardiovascular status: stable Postop Assessment: no apparent nausea or vomiting Anesthetic complications: no     Last Vitals:  Vitals:   06/26/18 1113 06/26/18 1126  BP: 124/67 115/69  Pulse: 76 81  Resp: 11   Temp: 37.1 C 37.1 C  SpO2: 98% 100%    Last Pain:  Vitals:   06/26/18 1126  TempSrc: Oral  PainSc: 0-No pain                 Deshaun Weisinger

## 2018-06-26 NOTE — Op Note (Signed)
[facility] Patient Name: [name] Procedure Date: 06/26/2018 10:33 AM MRN: [id number] Date of Birth: [birth year] Attending MD: Barney Drain MD, MD CSN: [id number] Age: 57 Admit Type: Outpatient Procedure:                Colonoscopy WITH COLD SNARE POLYPECTOMY Indications:              Screening for colorectal malignant neoplasm Providers:                Barney Drain MD, MD, Otis Peak B. Sharon Seller, RN,                            Randa Spike, Technician Referring MD:             [employer]mond [employer], MD Medicines:                Propofol per Anesthesia Complications:            No immediate complications. Estimated Blood Loss:     Estimated blood loss was minimal. Procedure:                Pre-Anesthesia Assessment:                           - Prior to the procedure, a History and Physical                            was performed, and patient medications and                            allergies were reviewed. The patient's tolerance of                            previous anesthesia was also reviewed. The risks                            and benefits of the procedure and the sedation                            options and risks were discussed with the patient.                            All questions were answered, and informed consent                            was obtained. Prior Anticoagulants: The patient                            last took ibuprofen 8 days prior to the procedure.                            ASA Grade Assessment: II - A patient with mild                            systemic disease. After reviewing the risks and  benefits, the patient was deemed in satisfactory                            condition to undergo the procedure. After obtaining                            informed consent, the colonoscope was passed under                            direct vision. Throughout the procedure, the                            patient's blood pressure,  pulse, and oxygen                            saturations were monitored continuously. The                            CF-[id number] ([id number]) scope was introduced through                            the anus and advanced to the the cecum, identified                            by appendiceal orifice and ileocecal valve. The                            ileocecal valve, appendiceal orifice, and rectum                            were photographed. The colonoscopy was somewhat                            difficult due to a tortuous colon. Successful                            completion of the procedure was aided by                            straightening and shortening the scope to obtain                            bowel loop reduction and COLOWRAP. The patient                            tolerated the procedure fairly well. The quality of                            the bowel preparation was good. Scope In: 10:50:17 AM Scope Out: 11:04:17 AM Scope Withdrawal Time: 0 hours 10 minutes 21 seconds  Total Procedure Duration: 0 hours 14 minutes 0 seconds  Findings:      Two sessile polyps were found in the ascending colon. The polyps were 4       to  5 mm in size. These polyps were removed with a cold snare. Resection       and retrieval were complete.      The recto-sigmoid colon and sigmoid colon were mildly redundant.      External hemorrhoids were found. The hemorrhoids were small. Impression:               - Two 4 to 5 mm polyps in the ascending colon,                            removed with a cold snare. Resected and retrieved.                           - Redundant colon.                           - SMALL EXTERNAL HEMORRHOIDS Moderate Sedation:      Per Anesthesia Care Recommendation:           - Patient has a contact number available for                            emergencies. The signs and symptoms of potential                            delayed complications were discussed with the                             patient. Return to normal activities tomorrow.                            Written discharge instructions were provided to the                            patient.                           - High fiber diet.                           - Continue present medications.                           - Await pathology results.                           - Repeat colonoscopy in 5-10 years for surveillance. Procedure Code(s):        --- Professional ---                           [id number], Colonoscopy, flexible; with removal of                            tumor(s), polyp(s), or other lesion(s) by snare                            technique Diagnosis Code(s):        ---  Professional ---                           Z12.11, Encounter for screening for malignant                            neoplasm of colon                           D12.2, Benign neoplasm of ascending colon                           Q43.8, Other specified congenital malformations of                            intestine CPT copyright 2018 [employer] Association. All rights reserved. The codes documented in this report are preliminary and upon coder review may  be revised to meet current compliance requirements. Barney Drain, MD Barney Drain MD, MD 06/26/2018 11:14:30 AM This report has been signed electronically. Number of Addenda: 0

## 2018-06-26 NOTE — Addendum Note (Signed)
Addendum  created 06/26/18 1301 by Vista Deck, CRNA   Sign clinical note

## 2018-06-26 NOTE — Discharge Instructions (Signed)
You had 2 polyps removed. You have SMALL EXTERNAL hemorrhoids.   DRINK WATER TO KEEP YOUR URINE LIGHT YELLOW.  FOLLOW A HIGH FIBER DIET. AVOID ITEMS THAT CAUSE BLOATING & GAS. SEE INFO BELOW.  YOUR BIOPSY RESULTS WILL BE BACK IN 5 BUSINESS DAYS.  Next colonoscopy in 5-10 years.    Colonoscopy Care After Read the instructions outlined below and refer to this sheet in the next week. These discharge instructions provide you with general information on caring for yourself after you leave the hospital. While your treatment has been planned according to the most current medical practices available, unavoidable complications occasionally occur. If you have any problems or questions after discharge, call DR. Tymber Stallings, [phone].  ACTIVITY  You may resume your regular activity, but move at a slower pace for the next 24 hours.   Take frequent rest periods for the next 24 hours.   Walking will help get rid of the air and reduce the bloated feeling in your belly (abdomen).   No driving for 24 hours (because of the medicine (anesthesia) used during the test).   You may shower.   Do not sign any important legal documents or operate any machinery for 24 hours (because of the anesthesia used during the test).    NUTRITION  Drink plenty of fluids.   You may resume your normal diet as instructed by your doctor.   Begin with a light meal and progress to your normal diet. Heavy or fried foods are harder to digest and may make you feel sick to your stomach (nauseated).   Avoid alcoholic beverages for 24 hours or as instructed.    MEDICATIONS  You may resume your normal medications.   WHAT YOU CAN EXPECT TODAY  Some feelings of bloating in the abdomen.   Passage of more gas than usual.   Spotting of blood in your stool or on the toilet paper  .  IF YOU HAD POLYPS REMOVED DURING THE COLONOSCOPY:  Eat a soft diet IF YOU HAVE NAUSEA, BLOATING, ABDOMINAL PAIN, OR VOMITING.      FINDING OUT THE RESULTS OF YOUR TEST Not all test results are available during your visit. DR. Oneida Alar WILL CALL YOU WITHIN 14 DAYS OF YOUR PROCEDUE WITH YOUR RESULTS. Do not assume everything is normal if you have not heard from DR. Mardy Hoppe, CALL HER OFFICE AT [phone].  SEEK IMMEDIATE MEDICAL ATTENTION AND CALL THE OFFICE: [phone] IF:  You have more than a spotting of blood in your stool.   Your belly is swollen (abdominal distention).   You are nauseated or vomiting.   You have a temperature over 101F.   You have abdominal pain or discomfort that is severe or gets worse throughout the day.   High-Fiber Diet A high-fiber diet changes your normal diet to include more whole grains, legumes, fruits, and vegetables. Changes in the diet involve replacing refined carbohydrates with unrefined foods. The calorie level of the diet is essentially unchanged. The Dietary Reference Intake (recommended amount) for adult males is 38 grams per day. For adult females, it is 25 grams per day. Pregnant and lactating women should consume 28 grams of fiber per day. Fiber is the intact part of a plant that is not broken down during digestion. Functional fiber is fiber that has been isolated from the plant to provide a beneficial effect in the body. PURPOSE  Increase stool bulk.   Ease and regulate bowel movements.   Lower cholesterol.   REDUCE RISK OF COLON  CANCER  INDICATIONS THAT YOU NEED MORE FIBER  Constipation and hemorrhoids.   Uncomplicated diverticulosis (intestine condition) and irritable bowel syndrome.   Weight management.   As a protective measure against hardening of the arteries (atherosclerosis), diabetes, and cancer.   GUIDELINES FOR INCREASING FIBER IN THE DIET  Start adding fiber to the diet slowly. A gradual increase of about 5 more grams (2 slices of whole-wheat bread, 2 servings of most fruits or vegetables, or 1 bowl of high-fiber cereal) per day is best. Too rapid  an increase in fiber may result in constipation, flatulence, and bloating.   Drink enough water and fluids to keep your urine clear or pale yellow. Water, juice, or caffeine-free drinks are recommended. Not drinking enough fluid may cause constipation.   Eat a variety of high-fiber foods rather than one type of fiber.   Try to increase your intake of fiber through using high-fiber foods rather than fiber pills or supplements that contain small amounts of fiber.   The goal is to change the types of food eaten. Do not supplement your present diet with high-fiber foods, but replace foods in your present diet.   INCLUDE A VARIETY OF FIBER SOURCES  Replace refined and processed grains with whole grains, canned fruits with fresh fruits, and incorporate other fiber sources. White rice, white breads, and most bakery goods contain little or no fiber.   Brown whole-grain rice, buckwheat oats, and many fruits and vegetables are all good sources of fiber. These include: broccoli, Brussels sprouts, cabbage, cauliflower, beets, sweet potatoes, white potatoes (skin on), carrots, tomatoes, eggplant, squash, berries, fresh fruits, and dried fruits.   Cereals appear to be the richest source of fiber. Cereal fiber is found in whole grains and bran. Bran is the fiber-rich outer coat of cereal grain, which is largely removed in refining. In whole-grain cereals, the bran remains. In breakfast cereals, the largest amount of fiber is found in those with "bran" in their names. The fiber content is sometimes indicated on the label.   You may need to include additional fruits and vegetables each day.   In baking, for 1 cup white flour, you may use the following substitutions:   1 cup whole-wheat flour minus 2 tablespoons.   1/2 cup white flour plus 1/2 cup whole-wheat flour.   Polyps, Colon  A polyp is extra tissue that grows inside your body. Colon polyps grow in the large intestine. The large intestine, also  called the colon, is part of your digestive system. It is a long, hollow tube at the end of your digestive tract where your body makes and stores stool. Most polyps are not dangerous. They are benign. This means they are not cancerous. But over time, some types of polyps can turn into cancer. Polyps that are smaller than a pea are usually not harmful. But larger polyps could someday become or may already be cancerous. To be safe, doctors remove all polyps and test them.   WHO GETS POLYPS? Anyone can get polyps, but certain people are more likely than others. You may have a greater chance of getting polyps if:  You are over 50.   You have had polyps before.   Someone in your family has had polyps.   Someone in your family has had cancer of the large intestine.   Find out if someone in your family has had polyps. You may also be more likely to get polyps if you:   Eat a lot of fatty foods  Smoke   Drink alcohol   Do not exercise  Eat too much   PREVENTION There is not one sure way to prevent polyps. You might be able to lower your risk of getting them if you:  Eat more fruits and vegetables and less fatty food.   Do not smoke.   Avoid alcohol.   Exercise every day.   Lose weight if you are overweight.   Eating more calcium and folate can also lower your risk of getting polyps. Some foods that are rich in calcium are milk, cheese, and broccoli. Some foods that are rich in folate are chickpeas, kidney beans, and spinach.

## 2018-06-26 NOTE — Anesthesia Preprocedure Evaluation (Signed)
Anesthesia Evaluation  Patient identified by MRN, date of birth, ID band Patient awake    Reviewed: Allergy & Precautions, NPO status , Patient's Chart, lab work & pertinent test results  Airway Mallampati: II  TM Distance: >3 FB Neck ROM: Full    Dental no notable dental hx. (+) Chipped, Teeth Intact   Pulmonary neg pulmonary ROS,    Pulmonary exam normal breath sounds clear to auscultation       Cardiovascular Exercise Tolerance: Good negative cardio ROS Normal cardiovascular examI Rhythm:Regular Rate:Normal     Neuro/Psych  Headaches, Depression negative psych ROS   GI/Hepatic negative GI ROS, Neg liver ROS,   Endo/Other  negative endocrine ROS  Renal/GU negative Renal ROS  negative genitourinary   Musculoskeletal  (+) Arthritis ,   Abdominal   Peds negative pediatric ROS (+)  Hematology negative hematology ROS (+) anemia ,   Anesthesia Other Findings   Reproductive/Obstetrics negative OB ROS                             Anesthesia Physical Anesthesia Plan  ASA: II  Anesthesia Plan: General   Post-op Pain Management:    Induction: Intravenous  PONV Risk Score and Plan:   Airway Management Planned: Nasal Cannula and Simple Face Mask  Additional Equipment:   Intra-op Plan:   Post-operative Plan:   Informed Consent: I have reviewed the patients History and Physical, chart, labs and discussed the procedure including the risks, benefits and alternatives for the proposed anesthesia with the patient or authorized representative who has indicated his/her understanding and acceptance.   Dental advisory given  Plan Discussed with: CRNA  Anesthesia Plan Comments:         Anesthesia Quick Evaluation

## 2018-06-28 NOTE — Progress Notes (Signed)
Pt is aware.  

## 2018-07-02 ENCOUNTER — Encounter (HOSPITAL_COMMUNITY): Payer: Self-pay | Admitting: Gastroenterology

## 2018-07-04 ENCOUNTER — Telehealth: Payer: Self-pay | Admitting: Gastroenterology

## 2018-07-04 DIAGNOSIS — R109 Unspecified abdominal pain: Secondary | ICD-10-CM

## 2018-07-04 NOTE — Telephone Encounter (Signed)
Left VM for a return call. 

## 2018-07-04 NOTE — Telephone Encounter (Signed)
PT is aware and OK to schedule the CT.

## 2018-07-04 NOTE — Telephone Encounter (Signed)
PATIENT SAID WE SENT HER TO HAVE AN XRAY TAKEN AND SHE WAS WANTING TO KNOW THE RESULTS [phone]

## 2018-07-04 NOTE — Telephone Encounter (Signed)
Pt was calling for her GES test results. She is aware that it was a normal study. She said that it feels like something moving around in her stomach almost all of the time. This has been going on for 7 months. She is getting very frustrated that she cannot find out what is causing that feeling. She is aware that Walden Field, NP is on vacation and I will forward the message to Dr. Oneida Alar to advise!

## 2018-07-04 NOTE — Telephone Encounter (Signed)
CT abd/pelvis approved. PA# [id number], 07/04/18-08/02/18.

## 2018-07-04 NOTE — Telephone Encounter (Addendum)
PLEASE CALL PT. HER STOMACH AND INTESTINES WILL MOVE AROUND IN HER ABDOMEN. IT CAN FEEL LIKE SHE HAS A BABY MOVING AROUND IN HER ABDOMEN.  WE CAN ORDER A CT ABD/PELVIS W/ IV AND ORAL CONTRAST AT [facility]. IF NO ACUTE FINDINGS ARE FOUND ON HER CT THEN WE WILL REFER HER TO [facility] FOR A SECOND OPINION, Dx: DYSPEPSIA.

## 2018-07-04 NOTE — Telephone Encounter (Signed)
CT abd/pelvis w/contrast scheduled for 07/24/18 at 11:00am, arrive at 10:45am. NPO 4 hours prior to test. Pick up contrast prior to appt. Called and informed pt. Letter mailed.

## 2018-07-04 NOTE — Addendum Note (Signed)
Addended by: Danie Binder on: 07/04/2018 01:45 PM   Modules accepted: Orders

## 2018-07-06 DIAGNOSIS — Q433 Congenital malformations of intestinal fixation: Secondary | ICD-10-CM

## 2018-07-06 HISTORY — DX: Congenital malformations of intestinal fixation: Q43.3

## 2018-07-23 ENCOUNTER — Ambulatory Visit: Payer: BLUE CROSS/BLUE SHIELD | Admitting: Nurse Practitioner

## 2018-07-24 ENCOUNTER — Encounter (HOSPITAL_COMMUNITY): Payer: Self-pay | Admitting: Gastroenterology

## 2018-07-24 ENCOUNTER — Ambulatory Visit (HOSPITAL_COMMUNITY)
Admission: RE | Admit: 2018-07-24 | Discharge: 2018-07-24 | Disposition: A | Discharge status: Home / Self Care | Payer: BLUE CROSS/BLUE SHIELD | Source: Ambulatory Visit | Attending: Gastroenterology | Admitting: Gastroenterology

## 2018-07-24 DIAGNOSIS — R109 Unspecified abdominal pain: Secondary | ICD-10-CM

## 2018-07-24 DIAGNOSIS — D259 Leiomyoma of uterus, unspecified: Secondary | ICD-10-CM | POA: Diagnosis not present

## 2018-07-24 MED ORDER — IOPAMIDOL (ISOVUE-300) INJECTION 61%
100.0000 mL | Freq: Once | INTRAVENOUS | Status: AC | PRN
  Administered 2018-07-24: 100 mL via INTRAVENOUS

## 2018-07-25 NOTE — Progress Notes (Signed)
LMOM to call.

## 2018-07-25 NOTE — Progress Notes (Signed)
Pt is aware and I am mailing this print out so she will have a note of the info. She will get a probiotic over the counter to take daily. She is scheduled to come back in Feb 2020. She will call if problems or questions before then.

## 2018-07-26 NOTE — Progress Notes (Signed)
CC'D TO PCP °

## 2018-08-06 ENCOUNTER — Other Ambulatory Visit: Payer: Self-pay | Admitting: Orthopedic Surgery

## 2018-08-07 ENCOUNTER — Telehealth: Payer: Self-pay | Admitting: Gastroenterology

## 2018-08-07 NOTE — Telephone Encounter (Signed)
Pt is aware and said she will call us the end of the week and let us know how she is doing.

## 2018-08-07 NOTE — Telephone Encounter (Signed)
Pt called asking to speak with DS. Please call her at [phone] questions about meds.

## 2018-08-07 NOTE — Telephone Encounter (Signed)
Pt said she started taking the [place] probiotics and it seems to have made her bloating worse. She is avoiding dairy. She is having more nausea on the probiotic.  She had stopped her Linzess 72 mcg when she was having more regular stools. Now she only has one or two BM's a week, although the consistency is normal. I told her to go back on the Linzess 72 mcg since she is only having one or two Bm's weekly. I told her to stop the probiotic that is causing nausea and more bloating.  Sending to Walden Field, NP who last saw the pt in the office, for recommendations.

## 2018-08-07 NOTE — Telephone Encounter (Signed)
Agree, no further recommendations at this time. Please call the patient to check on her status at the end of the week.

## 2018-08-09 ENCOUNTER — Ambulatory Visit (HOSPITAL_COMMUNITY)
Admission: RE | Admit: 2018-08-09 | Discharge: 2018-08-09 | Disposition: A | Discharge status: Home / Self Care | Payer: BLUE CROSS/BLUE SHIELD | Source: Ambulatory Visit | Attending: Family Medicine | Admitting: Family Medicine

## 2018-08-09 ENCOUNTER — Other Ambulatory Visit (HOSPITAL_COMMUNITY): Payer: Self-pay | Admitting: Family Medicine

## 2018-08-09 ENCOUNTER — Telehealth: Payer: Self-pay | Admitting: Gastroenterology

## 2018-08-09 DIAGNOSIS — M25532 Pain in left wrist: Secondary | ICD-10-CM | POA: Insufficient documentation

## 2018-08-09 DIAGNOSIS — S6992XA Unspecified injury of left wrist, hand and finger(s), initial encounter: Secondary | ICD-10-CM | POA: Diagnosis not present

## 2018-08-09 NOTE — Telephone Encounter (Signed)
PT said she started back on the Linzess and had a Bm on Tuesday, but none since. Please advise!

## 2018-08-09 NOTE — Telephone Encounter (Signed)
Pt was returning call to DS. Please call her back at [phone]

## 2018-08-09 NOTE — Telephone Encounter (Signed)
See previous note

## 2018-08-09 NOTE — Telephone Encounter (Signed)
LMOM for a return call to see how pt is doing.

## 2018-08-10 NOTE — Telephone Encounter (Signed)
She can try increasing the dose to 145 mcg (can take two 72 mcg OR she can pick up a couple bozed of 145 samples from our office).  Call with progress report 1 week after starting increased dose.

## 2018-08-13 NOTE — Telephone Encounter (Signed)
Pt is aware and will try. She has 4 of the 72 mcg capsules left and will take 2 a day for two days. Then she will pick up one box of the 145 mcg ( that is all I have) and she will take one capsule daily for 4 days. She said at the first of the year her insurance will be changing and they want her to take Lactulose or Miralax.

## 2018-08-15 NOTE — Telephone Encounter (Signed)
I received her letter. After the first of the year we can try miraLAX. If this doesn't work, then we can go back to other options. They'll just require trying MiraLAZ or Lactulose first.

## 2018-08-15 NOTE — Telephone Encounter (Signed)
Noted  

## 2018-08-20 ENCOUNTER — Telehealth: Payer: Self-pay | Admitting: Gastroenterology

## 2018-08-20 NOTE — Telephone Encounter (Signed)
Eric, please advise! 

## 2018-08-20 NOTE — Telephone Encounter (Signed)
Pt said the Linzess 145 mg was helping and she needed a generic brand called into [facility] and needed a 90 day supply. She said if there wasn't a generic brand that [facility] would not fill it and to call something else in that was similar to [place]. Please advise [phone]

## 2018-08-24 NOTE — Telephone Encounter (Signed)
Unfortunately Linzess isn't available as a generic right now. We could always have her try Amitiza 24 mcg bid (on a full stomach). Other options could include Trulance 3 mg daily without regard to food or Motegrity 2 mg daily  If her insurance won't cover ANY medications that are name brand, we can circle back and see what else we could try.

## 2018-08-27 NOTE — Telephone Encounter (Signed)
We do not have samples of Amitiza 24 mcg. I am leaving Trulance 3 mg tablets #14 at front for pick up. She is aware to take one tablet daily with or without food.

## 2018-08-31 NOTE — Telephone Encounter (Signed)
Noted, no further recommendations at this time. 

## 2018-09-07 ENCOUNTER — Telehealth: Payer: Self-pay | Admitting: Gastroenterology

## 2018-09-07 NOTE — Telephone Encounter (Signed)
I'd say the last option to try is Motegrity 2 mg once daily. We can leave samples at the front desk to try and call with progress report in 1 week.

## 2018-09-07 NOTE — Telephone Encounter (Signed)
PT still has a couple of Trulance, but it has not been helping. One Bm last week and one BM this week. She is aware Randall Hiss is with patients and I will let her know when I hear from him. I told her that even though the Miralax didn't help previously, she could try bid if after one dose it doesn't work. She will try that until she hears back from our office.

## 2018-09-07 NOTE — Telephone Encounter (Signed)
Patient called and said the Trulance is not working, she has only had 1 bowel movement last week and 1 this week.  [phone]  Please advise

## 2018-09-10 NOTE — Telephone Encounter (Signed)
Pt is aware I have #14 tablets of the Motegrity at front for pick up. She is aware to take one tablet daily and call in one week to let us know how it is doing.

## 2018-09-18 ENCOUNTER — Telehealth: Payer: Self-pay | Admitting: Gastroenterology

## 2018-09-18 NOTE — Telephone Encounter (Signed)
Pt said Motegrity worked well just a couple of days, but is no longer working.  Per Eric's previous message, pt could try Amitiza 24 mcg bid ( at that time we did not have the samples). I am leaving her #12 today to take bid with food and she will call and let us know how this does.

## 2018-09-18 NOTE — Telephone Encounter (Signed)
Pt called to said that she was to try [place] for one week and call with a report on how it was doing. She said it started out working well, but as the days went by she started feeling bloated and had to take an enema. She thinks maybe the medicine isn't strong enough. Please advise and call [phone]

## 2018-09-20 NOTE — Telephone Encounter (Signed)
Noted, no further recommendations at this time. 

## 2018-09-26 ENCOUNTER — Encounter: Payer: Self-pay | Admitting: Orthopedic Surgery

## 2018-09-26 ENCOUNTER — Ambulatory Visit (INDEPENDENT_AMBULATORY_CARE_PROVIDER_SITE_OTHER): Payer: BLUE CROSS/BLUE SHIELD | Admitting: Orthopedic Surgery

## 2018-09-26 VITALS — BP 137/84 | HR 92 | Ht 67.0 in | Wt 160.0 lb

## 2018-09-26 DIAGNOSIS — S62002A Unspecified fracture of navicular [scaphoid] bone of left wrist, initial encounter for closed fracture: Secondary | ICD-10-CM

## 2018-09-26 DIAGNOSIS — S62102A Fracture of unspecified carpal bone, left wrist, initial encounter for closed fracture: Secondary | ICD-10-CM

## 2018-09-26 NOTE — Patient Instructions (Signed)
Can remove the splint for bathing and sleeping wear during the day

## 2018-09-26 NOTE — Progress Notes (Signed)
NEW PROBLEM OFFICE VISIT  Chief Complaint  Patient presents with  . Wrist Pain    left wrist painful / pain goes into forearm since fall 59m ago    58 year old female presents for evaluation and treatment of ongoing pain in her left wrist.  She fell about 6 months ago going into a store landed on both wrists.  Since that time she has had ongoing severe pain distal radius decreased range of motion swelling loss of function.  She rates her pain as 7 or 8 out of 10 it is dull occasionally sharp with movement and with lifting which has been limited by the pain   Review of Systems  Constitutional: Negative for chills and fever.  Skin: Negative.   Neurological: Negative for tingling, sensory change and focal weakness.     Past Medical History:  Diagnosis Date  . Anemia   . Arthritis   . Breast cancer ([place])    right breast cancer 5 yrs ago  . Cancer [facility])     right breast cancer  . 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   APPENDIX IN LUQ  . Depression   . Dysrhythmia    hx palpitations-too much caffiene  . Headache     Past Surgical History:  Procedure Laterality Date  . ANKLE SURGERY     left ankle cyst  . BACK SURGERY  2000   lumbar disc surgery  . BREAST EXCISIONAL BIOPSY Left    benign  . BREAST SURGERY     left partail mastectomy-cyst  . CERVICAL DISC SURGERY  1999   anterior and a posterier cerv fusionx2  . CESAREAN SECTION     x2  . COLONOSCOPY    . COLONOSCOPY WITH PROPOFOL N/A 06/26/2018   Procedure: COLONOSCOPY WITH PROPOFOL;  Surgeon: Danie Binder, MD;  [facility];  Service: Endoscopy;  Laterality: N/A;  11:00am  . DILATION AND CURETTAGE OF UTERUS    . DILITATION & CURRETTAGE/HYSTROSCOPY WITH THERMACHOICE ABLATION  07/10/2012   Procedure: DILATATION & CURETTAGE/HYSTEROSCOPY WITH THERMACHOICE ABLATION;  Surgeon: [name], MD;  Location: [facility];  Service: Gynecology;  Laterality: N/A;  D5 19ml in, 13 ml out, temp 87degree celcius,  total therapy time 10min 16sec  . ERCP    . POLYPECTOMY  06/26/2018   Procedure: POLYPECTOMY;  Surgeon: Danie Binder, MD;  [facility];  Service: Endoscopy;;  colon  . TUBAL LIGATION     with last c-section    Family History  Problem Relation Age of Onset  . Hypertension Mother   . Colon cancer Neg Hx   . Gastric cancer Neg Hx   . Esophageal cancer Neg Hx    Social History   Tobacco Use  . Smoking status: Never Smoker  . Smokeless tobacco: Never Used  Substance Use Topics  . Alcohol use: No  . Drug use: No    No Known Allergies  Current Meds  Medication Sig  . ALPRAZolam (XANAX) 0.5 MG tablet Take 0.5 mg by mouth 2 (two) times daily as needed for anxiety.   Marland Kitchen amitriptyline (ELAVIL) 50 MG tablet Take 50 mg by mouth every other day.  . calcium carbonate (CALCIUM 600) 1500 (600 Ca) MG TABS tablet Take 600 mg of elemental calcium by mouth daily with breakfast.   . cyclobenzaprine (FLEXERIL) 10 MG tablet Take 10 mg by mouth 2 (two) times daily.   . diphenhydrAMINE (BENADRYL) 25 mg capsule Take 25 mg by mouth every 6 (six)  hours as needed.  . gabapentin (NEURONTIN) 100 MG capsule TAKE 1 CAPSULE BY MOUTH THREE TIMES A DAY.  . IBU 800 MG tablet Take 800 mg by mouth 2 (two) times daily as needed for moderate pain.   . iron polysaccharides (POLY-IRON 150) 150 MG capsule Take 150 mg by mouth daily as needed (low iron).   Marland Kitchen linaclotide (LINZESS) 72 MCG capsule Take 1 capsule (72 mcg total) by mouth daily before breakfast.  . Multiple Vitamins-Minerals (MULTIVITAMIN WOMEN 50+ PO) Take 1 tablet by mouth daily.  . traMADol (ULTRAM) 50 MG tablet   . venlafaxine XR (EFFEXOR-XR) 150 MG 24 hr capsule Take 150 mg by mouth daily with breakfast.    BP 137/84   Pulse 92   Ht 5\' 7"  (1.702 m)   Wt 160 lb (72.6 kg)   LMP 12/06/2013 Comment: pt states she had a surgery and they inserted a balloon to slow her periods down. she has a light period every month  BMI 25.06 kg/m    Physical Exam Vitals signs reviewed.  Constitutional:      Appearance: She is well-developed.  Neurological:     Mental Status: She is alert and oriented to person, place, and time.  Psychiatric:        Attention and Perception: Attention normal.        Mood and Affect: Mood and affect normal.        Speech: Speech normal.        Behavior: Behavior normal.        Thought Content: Thought content normal.        Judgment: Judgment normal.     Ortho Exam  Right upper extremity Inspection and palpation revealed no tenderness or swelling, the range of motion is full and normal without restriction.  Wrist stability tests are normal including the [facility] test.  Muscle tone and strength are excellent skin is intact pulses are good lymph nodes are negative there is normal sensation  On the left upper extremity find extreme tenderness over the distal radius.  There is restriction range of motion flexion extension causes increasing pain the wrist joint itself is also tender.  The Finkelstein's test is negative the grip strength is normal skin is intact pulses are good and the sensation is normal  MEDICAL DECISION SECTION  Xrays were done at [facility] in December  My independent reading of xrays:  The x-ray report indicated possible CPPD disease I see no signs of that I see a normal wrist normal scaphoid normal distal radius  Encounter Diagnoses  Name Primary?  . Closed fracture of left wrist, initial encounter Yes  . Occult closed fracture of scaphoid of left wrist, initial encounter     PLAN: (Rx., injectx, surgery, frx, mri/ct) Recommend wrist splinting CT scan for occult fracture  Follow-up after CT No orders of the defined types were placed in this encounter.   Arther Abbott, MD  09/26/2018 9:36 AM

## 2018-09-26 NOTE — Addendum Note (Signed)
Addended byCandice Camp on: 09/26/2018 11:43 AM   Modules accepted: Orders

## 2018-09-28 ENCOUNTER — Telehealth: Payer: Self-pay | Admitting: Gastroenterology

## 2018-09-28 ENCOUNTER — Telehealth: Payer: Self-pay | Admitting: Radiology

## 2018-09-28 DIAGNOSIS — K5900 Constipation, unspecified: Secondary | ICD-10-CM

## 2018-09-28 NOTE — Telephone Encounter (Signed)
Called patient to advise of CT and make appt.

## 2018-09-28 NOTE — Telephone Encounter (Signed)
Forwarding to Walden Field, NP for RX for the Amitiza 24 mcg.

## 2018-09-28 NOTE — Telephone Encounter (Signed)
[phone]  PATIENT CALLED AND SAID THE AMITIZA SAMPLES WORKED BETTER THAN ANYTHING AND WOULD LIKE A PRESCRIPTION.  [place]

## 2018-10-02 MED ORDER — LUBIPROSTONE 24 MCG PO CAPS: 24.0000 ug | ORAL_CAPSULE | Freq: Two times a day (BID) | ORAL | 3 refills | Status: DC

## 2018-10-02 NOTE — Telephone Encounter (Signed)
Prescription was sent to the pharmacy per patient request.  Please notify the patient.

## 2018-10-02 NOTE — Telephone Encounter (Signed)
PT is aware.

## 2018-10-02 NOTE — Addendum Note (Signed)
Addended by: Gordy Levan, Rosielee Corporan A on: 10/02/2018 12:06 PM   Modules accepted: Orders

## 2018-10-03 ENCOUNTER — Ambulatory Visit: Payer: BLUE CROSS/BLUE SHIELD | Admitting: Nurse Practitioner

## 2018-10-05 ENCOUNTER — Ambulatory Visit (HOSPITAL_COMMUNITY)
Admission: RE | Admit: 2018-10-05 | Discharge: 2018-10-05 | Disposition: A | Discharge status: Home / Self Care | Payer: BLUE CROSS/BLUE SHIELD | Source: Ambulatory Visit | Attending: Orthopedic Surgery | Admitting: Orthopedic Surgery

## 2018-10-05 DIAGNOSIS — S62002A Unspecified fracture of navicular [scaphoid] bone of left wrist, initial encounter for closed fracture: Secondary | ICD-10-CM | POA: Diagnosis not present

## 2018-10-05 DIAGNOSIS — S6992XA Unspecified injury of left wrist, hand and finger(s), initial encounter: Secondary | ICD-10-CM | POA: Diagnosis not present

## 2018-10-09 ENCOUNTER — Telehealth: Payer: Self-pay

## 2018-10-09 NOTE — Telephone Encounter (Signed)
I have started a PA for [name] 24 mcg.

## 2018-10-15 ENCOUNTER — Ambulatory Visit (INDEPENDENT_AMBULATORY_CARE_PROVIDER_SITE_OTHER): Payer: BLUE CROSS/BLUE SHIELD | Admitting: Orthopedic Surgery

## 2018-10-15 ENCOUNTER — Encounter: Payer: Self-pay | Admitting: Orthopedic Surgery

## 2018-10-15 VITALS — BP 132/76 | HR 96 | Ht 67.0 in | Wt 165.0 lb

## 2018-10-15 DIAGNOSIS — M92212 Osteochondrosis (juvenile) of carpal lunate [Kienbock], left hand: Secondary | ICD-10-CM

## 2018-10-15 DIAGNOSIS — Z6824 Body mass index (BMI) 24.0-24.9, adult: Secondary | ICD-10-CM | POA: Diagnosis not present

## 2018-10-15 DIAGNOSIS — S62002A Unspecified fracture of navicular [scaphoid] bone of left wrist, initial encounter for closed fracture: Secondary | ICD-10-CM | POA: Diagnosis not present

## 2018-10-15 DIAGNOSIS — Z1389 Encounter for screening for other disorder: Secondary | ICD-10-CM | POA: Diagnosis not present

## 2018-10-15 MED ORDER — GABAPENTIN 100 MG PO CAPS: 100.0000 mg | ORAL_CAPSULE | Freq: Three times a day (TID) | ORAL | 0 refills | Status: DC

## 2018-10-15 MED ORDER — TRAMADOL HCL 50 MG PO TABS: 50.0000 mg | ORAL_TABLET | Freq: Four times a day (QID) | ORAL | 2 refills | Status: DC | PRN

## 2018-10-15 NOTE — Addendum Note (Signed)
Addended by: Elizabeth Sauer on: 10/15/2018 11:10 AM   Modules accepted: Orders

## 2018-10-15 NOTE — Progress Notes (Signed)
CT scan FU  58 years old follow-up after CT scan was done to rule out occult fracture.  The patient has been in the splint she has been on ibuprofen still complains of severe pain dorsal wrist.  Previous history  58 year old female presents for evaluation and treatment of ongoing pain in her left wrist.  She fell about 6 months ago going into a store landed on both wrists.  Since that time she has had ongoing severe pain distal radius decreased range of motion swelling loss of function.  She rates her pain as 7 or 8 out of 10 it is dull occasionally sharp with movement and with lifting which has been limited by the pain  Review of systems denies numbness or tingling in the left upper extremity   Reexamination well-developed well-nourished female grooming hygiene intact alert and oriented x3 mood pleasant affect emotional crying frustrated with pain in the wrist and inability to get a diagnosis  Left wrist is tender over the dorsum in line with the third digit or long finger there is tenderness over the distal radius and carpus proximally pain with wrist extension no pain with wrist flexion  Right wrist is nontender  Encounter Diagnoses  Name Primary?  . Occult closed fracture of scaphoid of left wrist, initial encounter   . Kienbock's disease, left Yes    CT scan was reviewed with report there is no evidence of fracture there is cyst formation and arthritic changes around the lunate  Differential diagnosis of possible kind box disease occult fracture of the scaphoid was ruled out  Recommend consultation with hand surgery  Meds ordered this encounter  Medications  . traMADol (ULTRAM) 50 MG tablet    Sig: Take 1 tablet (50 mg total) by mouth every 6 (six) hours as needed.    Dispense:  30 tablet    Refill:  2  . gabapentin (NEURONTIN) 100 MG capsule    Sig: Take 1 capsule (100 mg total) by mouth 3 (three) times daily.    Dispense:  60 capsule    Refill:  0

## 2018-10-16 NOTE — Telephone Encounter (Signed)
PA for Amitiza was denied, it is not a covered benefit of her plan.  Randall Hiss, please advise!

## 2018-10-18 ENCOUNTER — Telehealth: Payer: Self-pay | Admitting: Radiology

## 2018-10-18 NOTE — Telephone Encounter (Signed)
Left message for patient to let me know if she has appointment with the hand surgeon yet, and gave her number to call if she has not heard anything.

## 2018-10-18 NOTE — Telephone Encounter (Signed)
I called and spoke to [facility] who said the Amitiza was denied because it is excluded on her plan. The only others to try are the ones that she has tried. Ok to submit a letter for an appeal as to why it is medically necessary. Phone number would be [phone] Fax number is [phone]

## 2018-10-18 NOTE — Telephone Encounter (Signed)
Can we call her insurance back and let them know she has tried and (failed) Linzess, Trulance, Motegrity. Amitiza is only thing that has worked for her. She is not on opioids and therefore doesn't have OIC.  Being that they're denying the PA for only thing that has worked, I'd like their input for what to give her or what they'll cover.

## 2018-10-22 ENCOUNTER — Telehealth: Payer: Self-pay | Admitting: Orthopedic Surgery

## 2018-10-22 DIAGNOSIS — M85442 Solitary bone cyst, left hand: Secondary | ICD-10-CM | POA: Diagnosis not present

## 2018-10-22 DIAGNOSIS — M25332 Other instability, left wrist: Secondary | ICD-10-CM | POA: Diagnosis not present

## 2018-10-22 NOTE — Telephone Encounter (Signed)
Thank you, noted.

## 2018-10-22 NOTE — Telephone Encounter (Signed)
Patient left message on voicemail on Friday afternoon stating she has an appointment today at 9 am with the hand surgeon.

## 2018-10-26 ENCOUNTER — Other Ambulatory Visit: Payer: Self-pay | Admitting: Orthopedic Surgery

## 2018-10-26 DIAGNOSIS — M8540 Solitary bone cyst, unspecified site: Secondary | ICD-10-CM

## 2018-10-26 DIAGNOSIS — R52 Pain, unspecified: Secondary | ICD-10-CM

## 2018-10-26 DIAGNOSIS — M25332 Other instability, left wrist: Secondary | ICD-10-CM

## 2018-10-29 ENCOUNTER — Encounter: Payer: Self-pay | Admitting: Nurse Practitioner

## 2018-10-29 NOTE — Telephone Encounter (Signed)
Sent and placed on your desk. Let me know if any other questions or if you need anything for this.

## 2018-10-29 NOTE — Telephone Encounter (Signed)
Faxed the letter

## 2018-10-29 NOTE — Telephone Encounter (Signed)
Waiting on appeal letter.

## 2018-10-30 ENCOUNTER — Telehealth: Payer: Self-pay

## 2018-10-30 DIAGNOSIS — K5900 Constipation, unspecified: Secondary | ICD-10-CM

## 2018-10-30 NOTE — Telephone Encounter (Signed)
T/C from [employer]. They received the appeal letter. Said they need additional supporting information as to why pt needs the Amitiza 24 mcg. Said she needs to try Lactulose of Symproic without additional supporting information.  Randall Hiss, please advise!

## 2018-10-31 ENCOUNTER — Ambulatory Visit: Payer: BLUE CROSS/BLUE SHIELD | Admitting: Gastroenterology

## 2018-11-02 MED ORDER — LACTULOSE 10 GM/15ML PO SOLN: 10.0000 g | Freq: Three times a day (TID) | ORAL | 0 refills | Status: DC | PRN

## 2018-11-02 NOTE — Telephone Encounter (Signed)
Seems we have conflicting information each time we have contact from them (they previously said she's tried everything on their formulary, now saying there's other options to try).  Please inform the patient that I will send in an Rx for lactulose. Take 2-3 times a day. Call with a progress report on if it is helping and how she is tolerating it.  Symproic is only labeled for opioid-induced constipation (OIC). Either her Rx manager is unaware that she's not on an opioid, or they're asking me to proscribe against FDA approval (which I would hope they wouldn't be advocating for).  We will get back to them with lactulose response.

## 2018-11-05 ENCOUNTER — Other Ambulatory Visit: Payer: Self-pay | Admitting: Orthopedic Surgery

## 2018-11-05 NOTE — Telephone Encounter (Signed)
I called to inform pt. She said she got Docusate Solution Stool soft 100 mg. She is taking and it is working well now.

## 2018-11-08 ENCOUNTER — Ambulatory Visit
Admission: RE | Admit: 2018-11-08 | Discharge: 2018-11-08 | Disposition: A | Discharge status: Home / Self Care | Payer: BLUE CROSS/BLUE SHIELD | Source: Ambulatory Visit | Attending: Orthopedic Surgery | Admitting: Orthopedic Surgery

## 2018-11-08 ENCOUNTER — Other Ambulatory Visit: Payer: BLUE CROSS/BLUE SHIELD

## 2018-11-08 DIAGNOSIS — M25332 Other instability, left wrist: Secondary | ICD-10-CM

## 2018-11-08 DIAGNOSIS — M8540 Solitary bone cyst, unspecified site: Secondary | ICD-10-CM

## 2018-11-08 DIAGNOSIS — M25532 Pain in left wrist: Secondary | ICD-10-CM | POA: Diagnosis not present

## 2018-11-08 DIAGNOSIS — M25432 Effusion, left wrist: Secondary | ICD-10-CM | POA: Diagnosis not present

## 2018-11-08 DIAGNOSIS — R52 Pain, unspecified: Secondary | ICD-10-CM

## 2018-11-08 MED ORDER — IOPAMIDOL (ISOVUE-M 200) INJECTION 41%
2.0000 mL | Freq: Once | INTRAMUSCULAR | Status: AC
  Administered 2018-11-08: 2 mL via INTRA_ARTICULAR

## 2018-11-08 NOTE — Telephone Encounter (Signed)
Excellent! I'm glad she's better. She can contact us for any worsening issues.

## 2018-11-09 NOTE — Telephone Encounter (Signed)
Left Vm for pt to call if she has any more issues.

## 2018-11-13 NOTE — Telephone Encounter (Signed)
Received fax from [employer] that Amitiza 24 mcg has been approved. It is good for 12 months, til 11/12/2019.  Case # [id number].  I have faxed to the pharmacy and pt is aware.

## 2018-11-23 DIAGNOSIS — M8548 Solitary bone cyst, other site: Secondary | ICD-10-CM | POA: Diagnosis not present

## 2018-11-23 DIAGNOSIS — M8540 Solitary bone cyst, unspecified site: Secondary | ICD-10-CM | POA: Insufficient documentation

## 2018-11-26 ENCOUNTER — Other Ambulatory Visit (HOSPITAL_COMMUNITY): Payer: Self-pay | Admitting: Orthopedic Surgery

## 2018-11-26 DIAGNOSIS — M8540 Solitary bone cyst, unspecified site: Secondary | ICD-10-CM

## 2018-12-25 ENCOUNTER — Ambulatory Visit (HOSPITAL_COMMUNITY)
Admission: RE | Admit: 2018-12-25 | Discharge: 2018-12-25 | Disposition: A | Discharge status: Home / Self Care | Payer: BLUE CROSS/BLUE SHIELD | Source: Ambulatory Visit | Attending: Orthopedic Surgery | Admitting: Orthopedic Surgery

## 2018-12-25 ENCOUNTER — Encounter (HOSPITAL_COMMUNITY)
Admission: RE | Admit: 2018-12-25 | Discharge: 2018-12-25 | Disposition: A | Discharge status: Home / Self Care | Payer: BLUE CROSS/BLUE SHIELD | Source: Ambulatory Visit | Attending: Orthopedic Surgery | Admitting: Orthopedic Surgery

## 2018-12-25 ENCOUNTER — Other Ambulatory Visit: Payer: Self-pay

## 2018-12-25 DIAGNOSIS — M8540 Solitary bone cyst, unspecified site: Secondary | ICD-10-CM | POA: Diagnosis not present

## 2018-12-25 DIAGNOSIS — S6990XA Unspecified injury of unspecified wrist, hand and finger(s), initial encounter: Secondary | ICD-10-CM | POA: Diagnosis not present

## 2018-12-25 MED ORDER — TECHNETIUM TC 99M MEDRONATE IV KIT
20.0000 | PACK | Freq: Once | INTRAVENOUS | Status: AC | PRN
  Administered 2018-12-25: 10:00:00 21.9 via INTRAVENOUS

## 2019-01-03 DIAGNOSIS — N342 Other urethritis: Secondary | ICD-10-CM | POA: Diagnosis not present

## 2019-01-03 DIAGNOSIS — Z6824 Body mass index (BMI) 24.0-24.9, adult: Secondary | ICD-10-CM | POA: Diagnosis not present

## 2019-01-03 DIAGNOSIS — Z1389 Encounter for screening for other disorder: Secondary | ICD-10-CM | POA: Diagnosis not present

## 2019-01-03 DIAGNOSIS — M545 Low back pain: Secondary | ICD-10-CM | POA: Diagnosis not present

## 2019-01-11 DIAGNOSIS — M8540 Solitary bone cyst, unspecified site: Secondary | ICD-10-CM | POA: Diagnosis not present

## 2019-01-23 DIAGNOSIS — Z0000 Encounter for general adult medical examination without abnormal findings: Secondary | ICD-10-CM | POA: Diagnosis not present

## 2019-01-23 DIAGNOSIS — Z1389 Encounter for screening for other disorder: Secondary | ICD-10-CM | POA: Diagnosis not present

## 2019-01-23 DIAGNOSIS — Z681 Body mass index (BMI) 19 or less, adult: Secondary | ICD-10-CM | POA: Diagnosis not present

## 2019-01-31 DIAGNOSIS — Z6824 Body mass index (BMI) 24.0-24.9, adult: Secondary | ICD-10-CM | POA: Diagnosis not present

## 2019-01-31 DIAGNOSIS — M5410 Radiculopathy, site unspecified: Secondary | ICD-10-CM | POA: Diagnosis not present

## 2019-02-18 DIAGNOSIS — I10 Essential (primary) hypertension: Secondary | ICD-10-CM | POA: Diagnosis not present

## 2019-02-20 DIAGNOSIS — M25532 Pain in left wrist: Secondary | ICD-10-CM | POA: Diagnosis not present

## 2019-02-20 DIAGNOSIS — M25332 Other instability, left wrist: Secondary | ICD-10-CM | POA: Diagnosis not present

## 2019-02-20 DIAGNOSIS — M8540 Solitary bone cyst, unspecified site: Secondary | ICD-10-CM | POA: Diagnosis not present

## 2019-02-26 ENCOUNTER — Other Ambulatory Visit (HOSPITAL_COMMUNITY): Payer: BLUE CROSS/BLUE SHIELD

## 2019-03-04 ENCOUNTER — Other Ambulatory Visit: Payer: Self-pay

## 2019-03-04 ENCOUNTER — Other Ambulatory Visit: Payer: Self-pay | Admitting: Internal Medicine

## 2019-03-04 DIAGNOSIS — Z20822 Contact with and (suspected) exposure to covid-19: Secondary | ICD-10-CM

## 2019-03-05 ENCOUNTER — Ambulatory Visit (HOSPITAL_COMMUNITY): Payer: BLUE CROSS/BLUE SHIELD | Admitting: Hematology

## 2019-03-09 LAB — NOVEL CORONAVIRUS, NAA: SARS-CoV-2, NAA: NOT DETECTED

## 2019-03-26 ENCOUNTER — Telehealth: Payer: Self-pay | Admitting: Obstetrics and Gynecology

## 2019-03-26 NOTE — Telephone Encounter (Signed)
Pt called wanting a diagnostic mammogram scheduled. Pt's cancer dr scheduled it last year so I advised to have her cancer dr order this one. Pt voiced understanding and agreed. [place]

## 2019-03-26 NOTE — Telephone Encounter (Signed)
Pt would like to schedule her mammogram at [facility] but they are needing a diagnostic mammogram order sent over to them. Please advise pt when order has been sent.

## 2019-03-28 ENCOUNTER — Other Ambulatory Visit (HOSPITAL_COMMUNITY): Payer: Self-pay | Admitting: Internal Medicine

## 2019-03-28 DIAGNOSIS — Z9889 Other specified postprocedural states: Secondary | ICD-10-CM

## 2019-04-18 DIAGNOSIS — E7800 Pure hypercholesterolemia, unspecified: Secondary | ICD-10-CM | POA: Diagnosis not present

## 2019-04-29 ENCOUNTER — Other Ambulatory Visit (HOSPITAL_COMMUNITY): Payer: Self-pay | Admitting: *Deleted

## 2019-04-29 DIAGNOSIS — C50211 Malignant neoplasm of upper-inner quadrant of right female breast: Secondary | ICD-10-CM

## 2019-04-29 DIAGNOSIS — C50419 Malignant neoplasm of upper-outer quadrant of unspecified female breast: Secondary | ICD-10-CM

## 2019-04-29 DIAGNOSIS — Z170 Estrogen receptor positive status [ER+]: Secondary | ICD-10-CM

## 2019-04-30 ENCOUNTER — Ambulatory Visit (HOSPITAL_COMMUNITY): Payer: BC Managed Care – PPO

## 2019-04-30 ENCOUNTER — Inpatient Hospital Stay (HOSPITAL_COMMUNITY): Payer: BC Managed Care – PPO | Attending: Hematology

## 2019-04-30 ENCOUNTER — Ambulatory Visit (HOSPITAL_COMMUNITY)
Admission: RE | Admit: 2019-04-30 | Discharge: 2019-04-30 | Disposition: A | Discharge status: Home / Self Care | Payer: BC Managed Care – PPO | Source: Ambulatory Visit | Attending: Internal Medicine | Admitting: Internal Medicine

## 2019-04-30 ENCOUNTER — Encounter (HOSPITAL_COMMUNITY): Payer: BC Managed Care – PPO

## 2019-04-30 ENCOUNTER — Other Ambulatory Visit: Payer: Self-pay

## 2019-04-30 DIAGNOSIS — Z9889 Other specified postprocedural states: Secondary | ICD-10-CM | POA: Diagnosis not present

## 2019-04-30 DIAGNOSIS — C50411 Malignant neoplasm of upper-outer quadrant of right female breast: Secondary | ICD-10-CM | POA: Insufficient documentation

## 2019-04-30 DIAGNOSIS — C50419 Malignant neoplasm of upper-outer quadrant of unspecified female breast: Secondary | ICD-10-CM

## 2019-04-30 DIAGNOSIS — C50211 Malignant neoplasm of upper-inner quadrant of right female breast: Secondary | ICD-10-CM

## 2019-04-30 DIAGNOSIS — Z853 Personal history of malignant neoplasm of breast: Secondary | ICD-10-CM | POA: Diagnosis not present

## 2019-04-30 DIAGNOSIS — Z170 Estrogen receptor positive status [ER+]: Secondary | ICD-10-CM

## 2019-04-30 DIAGNOSIS — R922 Inconclusive mammogram: Secondary | ICD-10-CM | POA: Diagnosis not present

## 2019-04-30 LAB — COMPREHENSIVE METABOLIC PANEL
ALT: 22 U/L (ref 0–44)
AST: 30 U/L (ref 15–41)
Albumin: 4.2 g/dL (ref 3.5–5.0)
Alkaline Phosphatase: 67 U/L (ref 38–126)
Anion gap: 8 (ref 5–15)
BUN: 12 mg/dL (ref 6–20)
CO2: 28 mmol/L (ref 22–32)
Calcium: 9.6 mg/dL (ref 8.9–10.3)
Chloride: 102 mmol/L (ref 98–111)
Creatinine, Ser: 0.57 mg/dL (ref 0.44–1.00)
GFR calc Af Amer: >60 mL/min (ref >60)
GFR calc non Af Amer: >60 mL/min (ref >60)
Glucose, Bld: 98 mg/dL (ref 70–99)
Potassium: 3.9 mmol/L (ref 3.5–5.1)
Sodium: 138 mmol/L (ref 135–145)
Total Bilirubin: 0.5 mg/dL (ref 0.3–1.2)
Total Protein: 7.7 g/dL (ref 6.5–8.1)

## 2019-04-30 LAB — LACTATE DEHYDROGENASE: LDH: 185 U/L (ref 98–192)

## 2019-04-30 LAB — CBC WITH DIFFERENTIAL/PLATELET
Abs Immature Granulocytes: 0.01 10*3/uL (ref 0.00–0.07)
Basophils Absolute: 0 10*3/uL (ref 0.0–0.1)
Basophils Relative: 1 %
Eosinophils Absolute: 0.1 10*3/uL (ref 0.0–0.5)
Eosinophils Relative: 2 %
HCT: 36.5 % (ref 36.0–46.0)
Hemoglobin: 11.6 g/dL — ABNORMAL LOW (ref 12.0–15.0)
Immature Granulocytes: 0 %
Lymphocytes Relative: 35 %
Lymphs Abs: 1.1 10*3/uL (ref 0.7–4.0)
MCH: 30.7 pg (ref 26.0–34.0)
MCHC: 31.8 g/dL (ref 30.0–36.0)
MCV: 96.6 fL (ref 80.0–100.0)
Monocytes Absolute: 0.4 10*3/uL (ref 0.1–1.0)
Monocytes Relative: 12 %
Neutro Abs: 1.6 10*3/uL — ABNORMAL LOW (ref 1.7–7.7)
Neutrophils Relative %: 50 %
Platelets: 279 10*3/uL (ref 150–400)
RBC: 3.78 MIL/uL — ABNORMAL LOW (ref 3.87–5.11)
RDW: 14 % (ref 11.5–15.5)
WBC: 3.1 10*3/uL — ABNORMAL LOW (ref 4.0–10.5)
nRBC: 0 % (ref 0.0–0.2)

## 2019-05-07 ENCOUNTER — Ambulatory Visit (HOSPITAL_COMMUNITY): Payer: BC Managed Care – PPO | Admitting: Hematology

## 2019-05-28 ENCOUNTER — Other Ambulatory Visit: Payer: Self-pay

## 2019-05-28 ENCOUNTER — Encounter: Payer: Self-pay | Admitting: Obstetrics and Gynecology

## 2019-05-28 ENCOUNTER — Ambulatory Visit (INDEPENDENT_AMBULATORY_CARE_PROVIDER_SITE_OTHER): Payer: BC Managed Care – PPO | Admitting: Obstetrics and Gynecology

## 2019-05-28 VITALS — BP 110/74 | HR 98 | Ht 67.0 in | Wt 146.8 lb

## 2019-05-28 DIAGNOSIS — N921 Excessive and frequent menstruation with irregular cycle: Secondary | ICD-10-CM | POA: Diagnosis not present

## 2019-05-28 NOTE — Progress Notes (Addendum)
Patient ID: [name], female   DOB: 1960/09/26, 58 y.o.   MRN: [id number]    [facility] Visit  @DATE @            Patient name: [name] MRN [id number]  Date of birth: 19-Jan-1961  CC & HPI:  [name] is a 58 y.o. female presenting today for breast check. Cysts in left breast seems to have thickened, and left breast is heavier compared to last year. Has changed sized bras and doesn't seem to help. Mammo came back normal. Has hx of breast cancer in right breast and left breast excisional biopsy. Was diagnosed in 2015. Recent mammo 04/30/2019: EXAM: DIGITAL DIAGNOSTIC BILATERAL MAMMOGRAM WITH CAD AND TOMO  COMPARISON:  Previous exam(s).  ACR Breast Density Category c: The breast tissue is heterogeneously dense, which may obscure small masses.  FINDINGS: There are lumpectomy changes in the upper inner right breast, stable. No mass, nonsurgical distortion, or suspicious microcalcification is identified in either breast to suggest malignancy. Biopsy clips are present outer right breast.  Mammographic images were processed with CAD.  IMPRESSION: No evidence of malignancy in either breast. Lumpectomy changes on right.  ROS:  ROS +fibrocystic changes in breast - nipple changes/ drainage All systems are negative except as noted in the HPI and PMH.   Pertinent History Reviewed:   Reviewed: Significant for  Medical         Past Medical History:  Diagnosis Date   Anemia    Arthritis    Breast cancer ([place])    right breast cancer 5 yrs ago   Cancer [facility])     right breast cancer   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   APPENDIX IN LUQ   Depression    Dysrhythmia    hx palpitations-too much caffiene   Headache                               Surgical Hx:    Past Surgical History:  Procedure Laterality Date   ANKLE SURGERY     left ankle cyst   BACK SURGERY  2000   lumbar disc surgery   BREAST EXCISIONAL BIOPSY Left    benign   BREAST SURGERY      left partail mastectomy-cyst   CERVICAL DISC SURGERY  1999   anterior and a posterier cerv fusionx2   CESAREAN SECTION     x2   COLONOSCOPY     COLONOSCOPY WITH PROPOFOL N/A 06/26/2018   Procedure: COLONOSCOPY WITH PROPOFOL;  Surgeon: Danie Binder, MD;  [facility];  Service: Endoscopy;  Laterality: N/A;  11:00am   DILATION AND CURETTAGE OF UTERUS     DILITATION & CURRETTAGE/HYSTROSCOPY WITH THERMACHOICE ABLATION  07/10/2012   Procedure: DILATATION & CURETTAGE/HYSTEROSCOPY WITH THERMACHOICE ABLATION;  Surgeon: [name], MD;  Location: [facility];  Service: Gynecology;  Laterality: N/A;  D5 6ml in, 13 ml out, temp 87degree celcius, total therapy time 12min 16sec   ERCP     POLYPECTOMY  06/26/2018   Procedure: POLYPECTOMY;  Surgeon: Danie Binder, MD;  [facility];  Service: Endoscopy;;  colon   TUBAL LIGATION     with last c-section   Medications: Reviewed & Updated - see associated section                       Current Outpatient Medications:    ALPRAZolam (XANAX) 0.5  MG tablet, Take 0.5 mg by mouth 2 (two) times daily as needed for anxiety. , Disp: , Rfl:    amitriptyline (ELAVIL) 50 MG tablet, Take 50 mg by mouth every other day., Disp: , Rfl:    calcium carbonate (CALCIUM 600) 1500 (600 Ca) MG TABS tablet, Take 600 mg of elemental calcium by mouth daily with breakfast. , Disp: , Rfl:    cyclobenzaprine (FLEXERIL) 10 MG tablet, Take 10 mg by mouth 2 (two) times daily. , Disp: , Rfl:    diphenhydrAMINE (BENADRYL) 25 mg capsule, Take 25 mg by mouth every 6 (six) hours as needed., Disp: , Rfl:    IBU 800 MG tablet, Take 800 mg by mouth 2 (two) times daily as needed for moderate pain. , Disp: , Rfl:    iron polysaccharides (POLY-IRON 150) 150 MG capsule, Take 150 mg by mouth daily as needed (low iron). , Disp: , Rfl:    Multiple Vitamins-Minerals (MULTIVITAMIN WOMEN 50+ PO), Take 1 tablet by mouth daily., Disp: , Rfl:    traMADol (ULTRAM)  50 MG tablet, Take 1 tablet (50 mg total) by mouth every 6 (six) hours as needed., Disp: 30 tablet, Rfl: 2   gabapentin (NEURONTIN) 100 MG capsule, Take 1 capsule (100 mg total) by mouth 3 (three) times daily. (Patient not taking: Reported on 05/28/2019), Disp: 60 capsule, Rfl: 0   lactulose (CHRONULAC) 10 GM/15ML solution, Take 15 mLs (10 g total) by mouth 3 (three) times daily as needed for mild constipation, moderate constipation or severe constipation. (Patient not taking: Reported on 05/28/2019), Disp: 236 mL, Rfl: 0   linaclotide (LINZESS) 72 MCG capsule, Take 1 capsule (72 mcg total) by mouth daily before breakfast. (Patient not taking: Reported on 10/15/2018), Disp: 90 capsule, Rfl: 3   lubiprostone (AMITIZA) 24 MCG capsule, Take 1 capsule (24 mcg total) by mouth 2 (two) times daily with a meal. (Patient not taking: Reported on 10/15/2018), Disp: 60 capsule, Rfl: 3   venlafaxine XR (EFFEXOR-XR) 150 MG 24 hr capsule, Take 150 mg by mouth daily with breakfast., Disp: , Rfl:    Social History: Reviewed -  reports that she has never smoked. She has never used smokeless tobacco.  Objective Findings:  Vitals: Last menstrual period 12/06/2013.  PHYSICAL EXAMINATION General appearance - alert, well appearing, and in no distress Mental status - alert, oriented to person, place, and time, normal mood, behavior, speech, dress, motor activity, and thought processes, affect appropriate to mood Breasts - right breast normal without mass, skin or nipple changes or axillary nodes, irregularity inferior outer quadrant.  fibrocystic changes due to lumpectomy.  3 clock crease in left breast.  Mobile firmness in upper outer quadrant and firmness in lower quadrant with gap 1.5 cm gap between.   PELVIC Deferred  Assessment & Plan:   A:  1.  Normal breast exam  P:  1.  Recommendation 3D mammogram in future for annual exams. 2. Pt informed that excision of the areas of cystic change would make future  mammogram interpretation more difficult    By signing my name below, I, Samul Dada, attest that this documentation has been prepared under the direction and in the presence of [name], MD. Electronically Signed: Samul Dada Medical Scribe. 05/28/19. 1:40 PM.  I personally performed the services described in this documentation, which was SCRIBED in my presence. The recorded information has been reviewed and considered accurate. It has been edited as necessary during review. [name], MD

## 2019-05-29 ENCOUNTER — Other Ambulatory Visit (HOSPITAL_COMMUNITY): Payer: Self-pay | Admitting: Obstetrics and Gynecology

## 2019-05-29 DIAGNOSIS — N6320 Unspecified lump in the left breast, unspecified quadrant: Secondary | ICD-10-CM

## 2019-06-18 ENCOUNTER — Ambulatory Visit (HOSPITAL_COMMUNITY)
Admission: RE | Admit: 2019-06-18 | Discharge: 2019-06-18 | Disposition: A | Discharge status: Home / Self Care | Payer: BC Managed Care – PPO | Source: Ambulatory Visit | Attending: Obstetrics and Gynecology | Admitting: Obstetrics and Gynecology

## 2019-06-18 ENCOUNTER — Other Ambulatory Visit: Payer: Self-pay

## 2019-06-18 DIAGNOSIS — N6489 Other specified disorders of breast: Secondary | ICD-10-CM | POA: Diagnosis not present

## 2019-06-18 DIAGNOSIS — R922 Inconclusive mammogram: Secondary | ICD-10-CM | POA: Diagnosis not present

## 2019-06-18 DIAGNOSIS — N6321 Unspecified lump in the left breast, upper outer quadrant: Secondary | ICD-10-CM | POA: Insufficient documentation

## 2019-06-18 DIAGNOSIS — N6320 Unspecified lump in the left breast, unspecified quadrant: Secondary | ICD-10-CM

## 2019-07-11 DIAGNOSIS — M62838 Other muscle spasm: Secondary | ICD-10-CM | POA: Diagnosis not present

## 2019-07-11 DIAGNOSIS — Z6821 Body mass index (BMI) 21.0-21.9, adult: Secondary | ICD-10-CM | POA: Diagnosis not present

## 2019-08-23 DIAGNOSIS — Z6821 Body mass index (BMI) 21.0-21.9, adult: Secondary | ICD-10-CM | POA: Diagnosis not present

## 2019-08-23 DIAGNOSIS — M109 Gout, unspecified: Secondary | ICD-10-CM | POA: Diagnosis not present

## 2019-10-08 ENCOUNTER — Encounter: Payer: Self-pay | Admitting: Gastroenterology

## 2019-10-23 ENCOUNTER — Telehealth: Payer: Self-pay

## 2019-10-23 ENCOUNTER — Encounter: Payer: Self-pay | Admitting: Gastroenterology

## 2019-10-23 ENCOUNTER — Ambulatory Visit (INDEPENDENT_AMBULATORY_CARE_PROVIDER_SITE_OTHER): Payer: BLUE CROSS/BLUE SHIELD | Admitting: Gastroenterology

## 2019-10-23 ENCOUNTER — Other Ambulatory Visit: Payer: Self-pay

## 2019-10-23 DIAGNOSIS — R1013 Epigastric pain: Secondary | ICD-10-CM | POA: Insufficient documentation

## 2019-10-23 MED ORDER — LIDOCAINE VISCOUS HCL 2 % MT SOLN: OROMUCOSAL | 5 refills | Status: DC

## 2019-10-23 NOTE — Assessment & Plan Note (Signed)
SYMPTOMS NOT IDEALLY CONTROLLED. DIFFERENTIAL DIAGNOSIS INCLUDES: H PYLORI GASTRITIS, LESS LIKELY PUD, OCCULT MALIGNANCY, OR SMALL INTESTINE BACTERIAL OVERGROWTH.  DRINK WATER TO KEEP YOUR URINE LIGHT YELLOW. USE VISCOUS LIDOCAINE 2 TSP EVERY 4 HOURS WHEN NEEDED FOR FLARES OF UPPER ABDOMINAL PAIN. USE A SYRINGE TO INJECT INTO THE BACK OF YOUR THROAT. USE NO MORE THAN 8 DOSES A DAY. IT WILL MAKE YOUR MOUTH, ESOPHAGUS, AND STOMACH NUMB COMPLETE  UPPER ENDOSCOPY TO EVALUATE UPPER ABDOMINAL DISCOMFORT.  DISCUSSED PROCEDURE, BENEFITS, & RISKS: < 1% chance of medication reaction, bleeding, perforation, or ASPIRATION.  FOLLOW UP IN 4 MOS.

## 2019-10-23 NOTE — Telephone Encounter (Signed)
Called pt, EGD w/Prop w/SLF scheduled for 01/07/20 at 2:00pm. Orders entered.

## 2019-10-23 NOTE — Progress Notes (Signed)
Subjective:    Patient ID: [name], female    DOB: [birth year], 60 y.o.   MRN: [id number]  Redmond School, MD   HPI Bowels moving good: 2-3x/week. FUNNY FEELING IN UPPER ABDOMEN FOR PAST MONTH. TAKES IRON PILLS BECAUSE SHE'S ANEMIA.DISCOMFORT FEELS LIKE A GRABBING. COMES AND GOES. TRIGGERS:???. NO TOBACO OR ETOH. WORRIES A LOT. NO RADIATION. BETTER: NOTHING RIGHT NOW. SYMPTOMS MAY LAST A COUPLE OF MINS: EASES AND THEN COMES AGAIN, OFF AND ON THROUGH THE DAY. EATING CALMS IT DOWN.   PT DENIES FEVER, CHILLS, HEMATOCHEZIA, HEMATEMESIS, nausea, vomiting, melena, diarrhea, CHEST PAIN, SHORTNESS OF BREATH, CHANGE IN BOWEL IN HABITS, constipation, problems swallowing, problems with sedation, OR heartburn or indigestion.  Past Medical History:  Diagnosis Date  . Anemia   . Arthritis   . Breast cancer ([place])    right breast cancer 5 yrs ago  . Cancer [facility])     right breast cancer  . 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   APPENDIX IN LUQ  . Depression   . Dysrhythmia    hx palpitations-too much caffiene  . Headache    Past Surgical History:  Procedure Laterality Date  . ANKLE SURGERY     left ankle cyst  . BACK SURGERY  2000   lumbar disc surgery  . BREAST EXCISIONAL BIOPSY Left    benign  . BREAST SURGERY     left partail mastectomy-cyst  . CERVICAL DISC SURGERY  1999   anterior and a posterier cerv fusionx2  . CESAREAN SECTION     x2  . COLONOSCOPY    . COLONOSCOPY WITH PROPOFOL N/A 06/26/2018   Procedure: COLONOSCOPY WITH PROPOFOL;  Surgeon: Danie Binder, MD;  [facility];  Service: Endoscopy;  Laterality: N/A;  11:00am  . DILATION AND CURETTAGE OF UTERUS    . DILITATION & CURRETTAGE/HYSTROSCOPY WITH THERMACHOICE ABLATION  07/10/2012   Procedure: DILATATION & CURETTAGE/HYSTEROSCOPY WITH THERMACHOICE ABLATION;  Surgeon: [name], MD;  Location: [facility] ORS;  Service: Gynecology;  Laterality: N/A;  D5 75ml in, 13 ml out, temp 87degree celcius, total  therapy time 63min 16sec  . ERCP    . POLYPECTOMY  06/26/2018   Procedure: POLYPECTOMY;  Surgeon: Danie Binder, MD;  Location: [facility] END[facility] SU[facility];  Service: Endoscopy;;  colon  . TUBAL LIGATION     with last c-section   No Known Allergies  Current Outpatient Medications  Medication Sig    . ALPRAZolam (XANAX) 0.5 MG tablet Take 0.5 mg by mouth as needed for anxiety.     Marland Kitchen amitriptyline (ELAVIL) 50 MG tablet Take 50 mg by mouth as needed.     . calcium carbonate (CALCIUM 600) 1500 (600 Ca) MG TABS tablet Take 600 mg of elemental calcium by mouth daily with breakfast.     . celecoxib (CELEBREX) 200 MG capsule Take 200 mg by mouth as needed.    . Cholecalciferol (VITAMIN D3) 10 MCG (400 UNIT) CAPS Take by mouth daily.    . cyclobenzaprine (FLEXERIL) 10 MG tablet Take 10 mg by mouth as needed.     . dicyclomine (BENTYL) 10 MG capsule Take 10 mg by mouth as needed.     . diphenhydrAMINE (BENADRYL) 25 mg capsule Take 25 mg by mouth every 6 (six) hours as needed.    . docusate sodium (COLACE) 100 MG capsule Take 100 mg by mouth daily.    .      . HYDROcodone-acetaminophen (NORCO) 10-325 MG tablet Take 1 tablet by  mouth as needed. QOD   . IBU 800 MG tablet Take 800 mg by mouth as needed for moderate pain.     Marland Kitchen MEGARED OMEGA-3 KRILL OIL PO Take by mouth daily.    . Multiple Vitamins-Minerals (MULTIVITAMIN WOMEN 50+ PO) Take 1 tablet by mouth daily.    .      . iron polysaccharides (POLY-IRON 150) 150 MG capsule Take 150 mg by mouth daily as needed (low iron).     .      Loma Sousa  PRN    .      .      .       Review of Systems PER HPI OTHERWISE ALL SYSTEMS ARE NEGATIVE.    Objective:   Physical Exam Constitutional:      General: She is not in acute distress.    Appearance: Normal appearance.  HENT:     Mouth/Throat:     Comments: MASK IN PLACE Eyes:     General: No scleral icterus.    Pupils: Pupils are equal, round, and reactive to light.  Cardiovascular:     Rate and Rhythm:  Normal rate and regular rhythm.     Pulses: Normal pulses.     Heart sounds: Normal heart sounds.  Pulmonary:     Effort: Pulmonary effort is normal.     Breath sounds: Normal breath sounds.  Abdominal:     General: Bowel sounds are normal.     Palpations: Abdomen is soft.     Tenderness: There is abdominal tenderness. There is no guarding or rebound.     Comments: MILD TTP IN THE EPIGASTRIUM & RUQS.  Musculoskeletal:     Cervical back: Normal range of motion.     Right lower leg: No edema.     Left lower leg: No edema.  Lymphadenopathy:     Cervical: No cervical adenopathy.  Skin:    General: Skin is warm and dry.  Neurological:     Mental Status: She is alert and oriented to person, place, and time.     Comments: NO  NEW FOCAL DEFICITS  Psychiatric:        Mood and Affect: Mood normal.     Comments: NORMAL AFFECT       Assessment & Plan:

## 2019-10-23 NOTE — Patient Instructions (Signed)
DRINK WATER TO KEEP YOUR URINE LIGHT YELLOW.   USE VISCOUS LIDOCAINE 2 TSP EVERY 4 HOURS WHEN NEEDED FOR FLARES OF UPPER ABDOMINAL PAIN. USE A SYRINGE TO INJECT INTO THE BACK OF YOUR THROAT. USE NO MORE THAN 8 DOSES A DAY. IT WILL MAKE YOUR MOUTH, ESOPHAGUS, AND STOMACH NUMB  COMPLETE  UPPER ENDOSCOPY TO EVALUATE UPPER ABDOMINAL DISCOMFORT.  FOLLOW UP IN 4 MOS.

## 2019-10-28 ENCOUNTER — Telehealth: Payer: Self-pay

## 2019-10-28 NOTE — Telephone Encounter (Signed)
COVID test and Pre-op appt 01/06/20. Letter mailed with procedure instructions.

## 2019-10-28 NOTE — Telephone Encounter (Signed)
Pt said the Lidocaine is not helping and she is still having the same problem.  She wants to know if there is anything else she can get to help.  She is aware that Dr. Oneida Alar is not in office today and will be at hospital tomorrow and I will call her when I hear back from Dr. Oneida Alar.

## 2019-10-28 NOTE — Progress Notes (Signed)
Cc'ed to pcp °

## 2019-10-29 ENCOUNTER — Telehealth: Payer: Self-pay | Admitting: Gastroenterology

## 2019-10-29 NOTE — Telephone Encounter (Signed)
Pt was following up from yesterday's phone note. I told her that DS has not gotten a reply from [facility] yet, but she is aware and will call her.

## 2019-10-29 NOTE — Telephone Encounter (Signed)
FYI to Dr. Fields.  

## 2019-11-01 NOTE — Telephone Encounter (Signed)
Routing to nurse pool.

## 2019-11-04 MED ORDER — PANTOPRAZOLE SODIUM 40 MG PO TBEC: DELAYED_RELEASE_TABLET | ORAL | 11 refills | Status: DC

## 2019-11-04 NOTE — Telephone Encounter (Signed)
SEE TC MAR 1

## 2019-11-04 NOTE — Addendum Note (Signed)
Addended by: Danie Binder on: 11/04/2019 01:24 PM   Modules accepted: Orders

## 2019-11-04 NOTE — Telephone Encounter (Signed)
PLEASE CALL PT. SHE CAN ADD PROTONIX BID AND USE TYLENOL OR ULTRAM WHEN NEEDED TO CONTROL HER PAIN. PLACE HER ON A CANCELLATION LIST TO MOVE UP EGD OR OFFER HER AN EGD WITH DR. Gala Romney.

## 2019-11-04 NOTE — Telephone Encounter (Signed)
Called pt and informed her of SLF's recommendations.  Pt said that she feels much better now and wants to cancel her procedure altogether.  She said that she thinks it was just her nerves bothering her.

## 2019-11-04 NOTE — Telephone Encounter (Signed)
Tried to call pt again.  LMOM 

## 2019-11-04 NOTE — Telephone Encounter (Signed)
PLEASE CALL PT. She should KEEP the EGD APPT. SHE CAN CEL 2 WEEKS BEFORE IF SHE STILL THINKS SHE DOES NOT NEED IT.

## 2019-11-05 DIAGNOSIS — Z1389 Encounter for screening for other disorder: Secondary | ICD-10-CM | POA: Diagnosis not present

## 2019-11-05 DIAGNOSIS — M1991 Primary osteoarthritis, unspecified site: Secondary | ICD-10-CM | POA: Diagnosis not present

## 2019-11-05 DIAGNOSIS — Z6822 Body mass index (BMI) 22.0-22.9, adult: Secondary | ICD-10-CM | POA: Diagnosis not present

## 2019-11-05 DIAGNOSIS — C50219 Malignant neoplasm of upper-inner quadrant of unspecified female breast: Secondary | ICD-10-CM | POA: Diagnosis not present

## 2019-11-05 NOTE — Telephone Encounter (Signed)
Called pt and made her aware of SLF's recommendations about keeping her EGD appt.  Pt aware to call us 2 weeks before procedure if she needs to cancel it.  Pt voiced understanding.

## 2019-11-13 DIAGNOSIS — C50219 Malignant neoplasm of upper-inner quadrant of unspecified female breast: Secondary | ICD-10-CM | POA: Diagnosis not present

## 2019-11-13 DIAGNOSIS — M1991 Primary osteoarthritis, unspecified site: Secondary | ICD-10-CM | POA: Diagnosis not present

## 2019-11-13 DIAGNOSIS — H8113 Benign paroxysmal vertigo, bilateral: Secondary | ICD-10-CM | POA: Diagnosis not present

## 2019-11-13 DIAGNOSIS — J329 Chronic sinusitis, unspecified: Secondary | ICD-10-CM | POA: Diagnosis not present

## 2019-11-13 DIAGNOSIS — Z6822 Body mass index (BMI) 22.0-22.9, adult: Secondary | ICD-10-CM | POA: Diagnosis not present

## 2019-11-13 DIAGNOSIS — K219 Gastro-esophageal reflux disease without esophagitis: Secondary | ICD-10-CM | POA: Diagnosis not present

## 2019-11-13 DIAGNOSIS — H8303 Labyrinthitis, bilateral: Secondary | ICD-10-CM | POA: Diagnosis not present

## 2019-11-13 DIAGNOSIS — I10 Essential (primary) hypertension: Secondary | ICD-10-CM | POA: Diagnosis not present

## 2019-11-14 ENCOUNTER — Emergency Department (HOSPITAL_COMMUNITY): Payer: BC Managed Care – PPO

## 2019-11-14 ENCOUNTER — Emergency Department (HOSPITAL_COMMUNITY)
Admission: EM | Admit: 2019-11-14 | Discharge: 2019-11-14 | Disposition: A | Discharge status: Home / Self Care | Payer: BC Managed Care – PPO | Attending: Emergency Medicine | Admitting: Emergency Medicine

## 2019-11-14 ENCOUNTER — Other Ambulatory Visit: Payer: Self-pay

## 2019-11-14 ENCOUNTER — Encounter (HOSPITAL_COMMUNITY): Payer: Self-pay | Admitting: Emergency Medicine

## 2019-11-14 DIAGNOSIS — Z853 Personal history of malignant neoplasm of breast: Secondary | ICD-10-CM | POA: Diagnosis not present

## 2019-11-14 DIAGNOSIS — R410 Disorientation, unspecified: Secondary | ICD-10-CM | POA: Diagnosis not present

## 2019-11-14 DIAGNOSIS — Z79899 Other long term (current) drug therapy: Secondary | ICD-10-CM | POA: Insufficient documentation

## 2019-11-14 DIAGNOSIS — R42 Dizziness and giddiness: Secondary | ICD-10-CM | POA: Diagnosis not present

## 2019-11-14 DIAGNOSIS — Q433 Congenital malformations of intestinal fixation: Secondary | ICD-10-CM | POA: Insufficient documentation

## 2019-11-14 DIAGNOSIS — N3000 Acute cystitis without hematuria: Secondary | ICD-10-CM | POA: Diagnosis not present

## 2019-11-14 DIAGNOSIS — R4182 Altered mental status, unspecified: Secondary | ICD-10-CM | POA: Diagnosis not present

## 2019-11-14 LAB — COMPREHENSIVE METABOLIC PANEL
ALT: 66 U/L — ABNORMAL HIGH (ref 0–44)
AST: 61 U/L — ABNORMAL HIGH (ref 15–41)
Albumin: 5 g/dL (ref 3.5–5.0)
Alkaline Phosphatase: 80 U/L (ref 38–126)
Anion gap: 10 (ref 5–15)
BUN: 9 mg/dL (ref 6–20)
CO2: 25 mmol/L (ref 22–32)
Calcium: 10 mg/dL (ref 8.9–10.3)
Chloride: 103 mmol/L (ref 98–111)
Creatinine, Ser: 0.58 mg/dL (ref 0.44–1.00)
GFR calc Af Amer: >60 mL/min (ref >60)
GFR calc non Af Amer: >60 mL/min (ref >60)
Glucose, Bld: 111 mg/dL — ABNORMAL HIGH (ref 70–99)
Potassium: 3.4 mmol/L — ABNORMAL LOW (ref 3.5–5.1)
Sodium: 138 mmol/L (ref 135–145)
Total Bilirubin: 0.6 mg/dL (ref 0.3–1.2)
Total Protein: 9.3 g/dL — ABNORMAL HIGH (ref 6.5–8.1)

## 2019-11-14 LAB — AMMONIA: Ammonia: 15 umol/L (ref 9–35)

## 2019-11-14 LAB — RAPID URINE DRUG SCREEN, HOSP PERFORMED
Amphetamines: NOT DETECTED
Barbiturates: NOT DETECTED
Benzodiazepines: NOT DETECTED
Cocaine: NOT DETECTED
Opiates: NOT DETECTED
Tetrahydrocannabinol: NOT DETECTED

## 2019-11-14 LAB — LACTIC ACID, PLASMA: Lactic Acid, Venous: 0.9 mmol/L (ref 0.5–1.9)

## 2019-11-14 LAB — URINALYSIS, COMPLETE (UACMP) WITH MICROSCOPIC
Bilirubin Urine: NEGATIVE
Glucose, UA: NEGATIVE mg/dL
Hgb urine dipstick: NEGATIVE
Ketones, ur: NEGATIVE mg/dL
Nitrite: NEGATIVE
Protein, ur: NEGATIVE mg/dL
Specific Gravity, Urine: 1.003 — ABNORMAL LOW (ref 1.005–1.030)
pH: 6 (ref 5.0–8.0)

## 2019-11-14 LAB — CBC WITH DIFFERENTIAL/PLATELET
Abs Immature Granulocytes: 0.01 10*3/uL (ref 0.00–0.07)
Basophils Absolute: 0.1 10*3/uL (ref 0.0–0.1)
Basophils Relative: 1 %
Eosinophils Absolute: 0.1 10*3/uL (ref 0.0–0.5)
Eosinophils Relative: 2 %
HCT: 42.4 % (ref 36.0–46.0)
Hemoglobin: 13.4 g/dL (ref 12.0–15.0)
Immature Granulocytes: 0 %
Lymphocytes Relative: 33 %
Lymphs Abs: 1.7 10*3/uL (ref 0.7–4.0)
MCH: 30.7 pg (ref 26.0–34.0)
MCHC: 31.6 g/dL (ref 30.0–36.0)
MCV: 97 fL (ref 80.0–100.0)
Monocytes Absolute: 0.4 10*3/uL (ref 0.1–1.0)
Monocytes Relative: 8 %
Neutro Abs: 2.8 10*3/uL (ref 1.7–7.7)
Neutrophils Relative %: 56 %
Platelets: 330 10*3/uL (ref 150–400)
RBC: 4.37 MIL/uL (ref 3.87–5.11)
RDW: 13.9 % (ref 11.5–15.5)
WBC: 5.1 10*3/uL (ref 4.0–10.5)
nRBC: 0 % (ref 0.0–0.2)

## 2019-11-14 LAB — ETHANOL: Alcohol, Ethyl (B): <10 mg/dL (ref <10)

## 2019-11-14 MED ORDER — SODIUM CHLORIDE 0.9 % IV SOLN
1.0000 g | Freq: Once | INTRAVENOUS | Status: AC
  Administered 2019-11-14: 21:00:00 1 g via INTRAVENOUS
  Filled 2019-11-14: qty 10

## 2019-11-14 MED ORDER — CEPHALEXIN 500 MG PO CAPS: ORAL_CAPSULE | ORAL | 0 refills | Status: DC

## 2019-11-14 NOTE — Discharge Instructions (Addendum)
Contact a health care provider if:  Your symptoms do not get better after 1-2 days.  Your symptoms go away and then return.  Get help right away if you have:  Severe pain in your back or your lower abdomen.  A fever.  Nausea or vomiting.

## 2019-11-14 NOTE — ED Notes (Signed)
Pt stated to put IV in right arm because left is restricted due to breast cancer.

## 2019-11-14 NOTE — ED Provider Notes (Signed)
[facility] Provider Note   CSN: [id number] Arrival date & time: 11/14/19  1513     History Chief Complaint  Patient presents with  . Altered Mental Status    [name] is a 59 y.o. female who presents emergency department with a chief complaint of confusion.  Patient states that she had a onset of symptoms about 1 week ago after receiving an IM steroid injection for left wrist pain.Since that time she has had onset of intermittent room spinning dizziness and confusion.  She has associated ataxia.  The symptoms seem to come and go.  She is also noticed some worsening visual changes including blurred vision but denies unilateral vision loss or double vision. The patient states that she went to see her primary care physician about 2 days ago with these complaints including the dizziness and has a history of previous episode of intermittent vertigo several years ago.  She was started on a prednisone Dosepak and meclizine.  She is not had any worsening in her symptoms however her symptoms have continued.  Her PCP sent her to the emergency department today. Patient is also complaining of intermittent confusion.  She states that they seem to be associated.  She states that she was at the [facility] and had difficulty doing a banking transaction and remembering the things that she needed and felt extremely confused.  She states that other examples are she has a big step at her house and has forgotten to look down to check to make sure that she is not going to fall.  Her husband noticed this and pointed it out.  She denies word finding difficulty, other symptoms of expressive or receptive aphasia.  She is alert and oriented to person place time and event today.  HPI     Past Medical History:  Diagnosis Date  . Anemia   . Arthritis   . Breast cancer ([place])    right breast cancer 5 yrs ago  . Cancer [facility])     right breast cancer  . 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   APPENDIX IN LUQ  . Depression   . Dysrhythmia    hx palpitations-too much caffiene  . Headache     Patient Active Problem List   Diagnosis Date Noted  . Dyspepsia 10/23/2019  . Constipation 04/16/2018  . Encounter for screening colonoscopy 04/16/2018  . Cancer of breast, intraductal, right 12/21/2016  . Pap smear, as part of routine gynecological examination 12/21/2016  . Perimenopausal and postmenopausal vasomotor symptoms 10/09/2014  . Routine gynecological examination 11/21/2013  . Cancer of upper-inner quadrant of female breast ([place]) 10/11/2013  . Menorrhagia with irregular cycle 07/10/2012    Past Surgical History:  Procedure Laterality Date  . ANKLE SURGERY     left ankle cyst  . BACK SURGERY  2000   lumbar disc surgery  . BREAST EXCISIONAL BIOPSY Left    benign  . BREAST SURGERY     left partail mastectomy-cyst  . CERVICAL DISC SURGERY  1999   anterior and a posterier cerv fusionx2  . CESAREAN SECTION     x2  . COLONOSCOPY    . COLONOSCOPY WITH PROPOFOL N/A 06/26/2018   Procedure: COLONOSCOPY WITH PROPOFOL;  Surgeon: Danie Binder, MD;  Location[facility];  Service: Endoscopy;  Laterality: N/A;  11:00am  . DILATION AND CURETTAGE OF UTERUS    . DILITATION & CURRETTAGE/HYSTROSCOPY WITH THERMACHOICE ABLATION  07/10/2012   Procedure: DILATATION & CURETTAGE/HYSTEROSCOPY WITH THERMACHOICE ABLATION;  Surgeon: [name], MD;  Location: [facility] ORS;  Service: Gynecology;  Laterality: N/A;  D5 51ml in, 13 ml out, temp 87degree celcius, total therapy time 52min 16sec  . ERCP    . POLYPECTOMY  06/26/2018   Procedure: POLYPECTOMY;  Surgeon: Danie Binder, MD;  Location: [facility];  Service: Endoscopy;;  colon  . TUBAL LIGATION     with last c-section     OB History   No obstetric history on file.     Family History  Problem Relation Age of Onset  . Hypertension Mother   . Colon cancer Neg Hx   . Gastric cancer Neg Hx   . Esophageal cancer Neg Hx      Social History   Tobacco Use  . Smoking status: Never Smoker  . Smokeless tobacco: Never Used  Substance Use Topics  . Alcohol use: No  . Drug use: No    Home Medications Prior to Admission medications   Medication Sig Start Date End Date Taking? Authorizing Provider  ALPRAZolam Duanne Moron) 0.5 MG tablet Take 0.5 mg by mouth as needed for anxiety.    Yes [provider]  calcium carbonate (CALCIUM 600) 1500 (600 Ca) MG TABS tablet Take 600 mg of elemental calcium by mouth daily with breakfast.    Yes [provider]  celecoxib (CELEBREX) 200 MG capsule Take 200 mg by mouth daily as needed for mild pain or moderate pain.    Yes [provider]  Cholecalciferol (VITAMIN D3) 10 MCG (400 UNIT) CAPS Take 400 Units by mouth daily.    Yes [provider]  cyclobenzaprine (FLEXERIL) 10 MG tablet Take 10 mg by mouth daily as needed for muscle spasms.    Yes [provider]  docusate sodium (COLACE) 100 MG capsule Take 100 mg by mouth daily.   Yes [provider]  IBU 800 MG tablet Take 800 mg by mouth daily as needed for moderate pain.  02/01/18  Yes [provider]  meclizine (ANTIVERT) 25 MG tablet Take 25 mg by mouth 4 (four) times daily as needed for dizziness.  11/13/19  Yes [provider]  MEGARED OMEGA-3 KRILL OIL PO Take 1 capsule by mouth daily.    Yes [provider]  methylPREDNISolone (MEDROL DOSEPAK) 4 MG TBPK tablet Take 4-24 mg by mouth See admin instructions. Take as directed per package instructions starting on 11/13/2019 for 6 days (6,5,4,3,2,1) 11/13/19  Yes [provider]  Multiple Vitamins-Minerals (MULTIVITAMIN WOMEN 50+ PO) Take 1 tablet by mouth daily.   Yes [provider]  pantoprazole (PROTONIX) 40 MG tablet 1 PO 30 MINUTES PRIOR TO MEALS BID FOR 3 MOS THEN QD Patient taking differently: Take 40 mg by mouth 2 (two) times daily.  11/04/19  Yes Fields, Marga Melnick, MD  cephALEXin  (KEFLEX) 500 MG capsule 2 caps po bid x 7 days 11/14/19   Margarita Mail, PA-C    Allergies    Patient has no known allergies.  Review of Systems   Review of Systems Ten systems reviewed and are negative for acute change, except as noted in the HPI. - Physical Exam Updated Vital Signs BP (!) 137/94   Pulse 88   Temp 98.1 F (36.7 C) (Oral)   Resp 18   Ht 5\' 7"  (1.702 m)   Wt 70.8 kg   LMP 12/06/2013 Comment: pt states she had a surgery and they inserted a balloon to slow her periods down. she has a light  period every month  SpO2 100%   BMI 24.43 kg/m   Physical Exam Vitals and nursing note reviewed.  Constitutional:      General: She is not in acute distress.    Appearance: She is well-developed. She is not diaphoretic.  HENT:     Head: Normocephalic and atraumatic.  Eyes:     General: No scleral icterus.    Conjunctiva/sclera: Conjunctivae normal.  Cardiovascular:     Rate and Rhythm: Normal rate and regular rhythm.     Heart sounds: Normal heart sounds. No murmur. No friction rub. No gallop.   Pulmonary:     Effort: Pulmonary effort is normal. No respiratory distress.     Breath sounds: Normal breath sounds.  Abdominal:     General: Bowel sounds are normal. There is no distension.     Palpations: Abdomen is soft. There is no mass.     Tenderness: There is no abdominal tenderness. There is no guarding.  Musculoskeletal:     Cervical back: Normal range of motion.  Skin:    General: Skin is warm and dry.  Neurological:     Mental Status: She is alert and oriented to person, place, and time.     Comments: Speech is clear and goal oriented, follows commands Major Cranial nerves without deficit, no facial droop Normal strength in upper and lower extremities bilaterally including dorsiflexion and plantar flexion, strong and equal grip strength Sensation normal to light and sharp touch Moves extremities without ataxia, coordination intact Normal finger to nose and  rapid alternating movements Neg romberg, no pronator drift Normal gait Normal heel-shin and balance   Psychiatric:        Behavior: Behavior normal.     ED Results / Procedures / Treatments   Labs (all labs ordered are listed, but only abnormal results are displayed) Labs Reviewed  COMPREHENSIVE METABOLIC PANEL - Abnormal; Notable for the following components:      Result Value   Potassium 3.4 (*)    Glucose, Bld 111 (*)    Total Protein 9.3 (*)    AST 61 (*)    ALT 66 (*)    All other components within normal limits  URINALYSIS, COMPLETE (UACMP) WITH MICROSCOPIC - Abnormal; Notable for the following components:   Color, Urine STRAW (*)    Specific Gravity, Urine 1.003 (*)    Leukocytes,Ua LARGE (*)    Bacteria, UA RARE (*)    All other components within normal limits  CBC WITH DIFFERENTIAL/PLATELET  AMMONIA  LACTIC ACID, PLASMA  RAPID URINE DRUG SCREEN, HOSP PERFORMED  ETHANOL  CBG MONITORING, ED    EKG EKG Interpretation  Date/Time:  Thursday November 14 2019 17:42:56 EST Ventricular Rate:  92 PR Interval:    QRS Duration: 97 QT Interval:  363 QTC Calculation: 449 R Axis:   63 Text Interpretation: Sinus rhythm Biatrial enlargement Baseline wander in lead(s) III aVL since last tracing no significant change Confirmed by Noemi Chapel [phone]) on 11/14/2019 5:48:41 PM   Radiology CT Head Wo Contrast  Result Date: 11/14/2019 CLINICAL DATA:  Confusion. History of RIGHT breast cancer 5 years ago. EXAM: CT HEAD WITHOUT CONTRAST TECHNIQUE: Contiguous axial images were obtained from the base of the skull through the vertex without intravenous contrast. COMPARISON:  Head CT dated 04/15/2008. FINDINGS: Brain: Ventricles are normal in size and configuration. There is no mass, hemorrhage, edema or other evidence of acute parenchymal abnormality. No extra-axial hemorrhage. Vascular: No hyperdense vessel or unexpected calcification. Skull: Normal.  Negative for fracture or focal  lesion. Sinuses/Orbits: No acute finding. Other: None. IMPRESSION: Negative head CT. No intracranial mass, hemorrhage or edema. Electronically Signed   By: Franki Cabot M.D.   On: 11/14/2019 17:40   MR BRAIN WO CONTRAST  Result Date: 11/14/2019 CLINICAL DATA:  Vertigo EXAM: MRI HEAD WITHOUT CONTRAST TECHNIQUE: Multiplanar, multiecho pulse sequences of the brain and surrounding structures were obtained without intravenous contrast. COMPARISON:  None. FINDINGS: Brain: There is no acute infarction or intracranial hemorrhage. There is no intracranial mass, mass effect, or edema. There is no hydrocephalus or extra-axial fluid collection. Ventricles and sulci are within normal limits in size and configuration. Patchy T2 hyperintensity in the supratentorial white matter is nonspecific but may reflect mild chronic microvascular ischemic or therapy related changes. Vascular: Major vessel flow voids at the skull base are preserved. Skull and upper cervical spine: Normal marrow signal is preserved. Sinuses/Orbits: Paranasal sinuses are aerated. Orbits are unremarkable. Other: Sella is unremarkable.  Mastoid air cells are clear. Electronically Signed   By: Macy Mis M.D.   On: 11/14/2019 19:55    Procedures Procedures (including critical care time)  Medications Ordered in ED Medications  cefTRIAXone (ROCEPHIN) 1 g in sodium chloride 0.9 % 100 mL IVPB (1 g Intravenous New Bag/Given 11/14/19 2116)    ED Course  I have reviewed the triage vital signs and the nursing notes.  Pertinent labs & imaging results that were available during my care of the patient were reviewed by me and considered in my medical decision making (see chart for details).  Clinical Course as of Nov 13 2156  Thu Nov 14, 2019  1924 Leukocytes,Ua(!): LARGE [AH]  1924 Bacteria, UA(!): RARE [AH]    Clinical Course User Index [AH] Margarita Mail, PA-C   MDM Rules/Calculators/A&P                      CC:ams VS:  Vitals:    11/14/19 1940 11/14/19 2000 11/14/19 2030 11/14/19 2110  BP: (!) 154/85 (!) 156/97 (!) 143/88 (!) 137/94  Pulse: 92 97 94 88  Resp: 20 20 18 18   Temp:      TempSrc:      SpO2: 100% 100% 100% 100%  Weight:      Height:       [id number] is gathered by Patient and EMR. DDX:The differential diagnosis for AMS is extensive and includes, but is not limited to: drug overdose - opioids, alcohol, sedatives, antipsychotics, drug withdrawal, others; Metabolic: hypoxia, hypoglycemia, hyperglycemia, hypercalcemia, hypernatremia, hyponatremia, uremia, hepatic encephalopathy, hypothyroidism, hyperthyroidism, vitamin B12 or thiamine deficiency, carbon monoxide poisoning, Wilson's disease, Lactic acidosis, DKA/HHOS; Infectious: meningitis, encephalitis, bacteremia/sepsis, urinary tract infection, pneumonia, neurosyphilis; Structural: Space-occupying lesion, (brain tumor, subdural hematoma, hydrocephalus,); Vascular: stroke, subarachnoid hemorrhage, coronary ischemia, hypertensive encephalopathy, CNS vasculitis, thrombotic thrombocytopenic purpura, disseminated intravascular coagulation, hyperviscosity; Psychiatric: Schizophrenia, depression; Other: Seizure, hypothermia, heat stroke, ICU psychosis, dementia -"sundowning." Labs: I reviewed the labs which show CMP shows mild hypokalemia of insignificant value.  Slightly elevated ALT and AST of insignificant value.  UDS negative, normal ammonia level, lactic acid within normal limits.  Patient CBC shows no elevated white blood cell count or other abnormality.  Urine does appear infected with elevated leukocyte count, no obvious contamination with squamous cell epithelial cells. Imaging: I personally reviewed the images (CT head and MRI brain) which show(s) no acute abnormalities on my interpretation EKG: Normal sinus rhythm at a rate of 34 MDM: 59 year old female here with altered mental status.  Case reviewed with Dr. Sabra Heck.  Patient has no obvious metabolic  abnormalities, afebrile, no signs of sepsis.  Her imaging shows no acute stroke findings.  She has no mass or space containing lesions within the brain.  The most obvious cause of her confusion and vague mental status changes likely a urinary tract infection.  Patient treated here with Rocephin.  She will be discharged with Keflex.  She for follow closely with her primary care physician. Patient disposition: Discharge Patient condition: . The patient appears reasonably screened and/or stabilized for discharge and I doubt any other medical condition or other [facility] requiring further screening, evaluation, or treatment in the ED at this time prior to discharge. I have discussed lab and/or imaging findings with the patient and answered all questions/concerns to the best of my ability. I have discussed return precautions and OP follow up.     Final Clinical Impression(s) / ED Diagnoses Final diagnoses:  Acute cystitis without hematuria    Rx / DC Orders ED Discharge Orders         Ordered    cephALEXin (KEFLEX) 500 MG capsule     11/14/19 2155           Margarita Mail, PA-C 11/14/19 2159    Noemi Chapel, MD 11/14/19 2255

## 2019-11-14 NOTE — ED Triage Notes (Signed)
Pt reports increased confusion since Tuesday of last week.  Was seen by pcp and given steroids d/t inner ear problems.  Went in office today to pay a bill and was told to come to ER because she is still having confusion.  Pt is a&o x 4 at this time.

## 2019-11-14 NOTE — ED Provider Notes (Signed)
Pt withi some AMS after taking steroids Comfortable appearing, mild ataxia when she walks, Answers questions appropriately, normal strength and CN 3-12, clear heart and lung, VS stable, MRI - dispo pending MRI  Medical screening examination/treatment/procedure(s) were conducted as a shared visit with non-physician practitioner(s) and myself.  I personally evaluated the patient during the encounter.  Clinical Impression:   Final diagnoses:  Acute cystitis without hematuria         Noemi Chapel, MD 11/14/19 2255

## 2019-11-18 ENCOUNTER — Ambulatory Visit: Payer: Medicare Other | Admitting: Orthopedic Surgery

## 2019-11-29 DIAGNOSIS — I10 Essential (primary) hypertension: Secondary | ICD-10-CM | POA: Diagnosis not present

## 2019-11-29 DIAGNOSIS — Z6822 Body mass index (BMI) 22.0-22.9, adult: Secondary | ICD-10-CM | POA: Diagnosis not present

## 2019-11-29 DIAGNOSIS — M779 Enthesopathy, unspecified: Secondary | ICD-10-CM | POA: Diagnosis not present

## 2019-11-29 DIAGNOSIS — N342 Other urethritis: Secondary | ICD-10-CM | POA: Diagnosis not present

## 2019-11-29 DIAGNOSIS — K219 Gastro-esophageal reflux disease without esophagitis: Secondary | ICD-10-CM | POA: Diagnosis not present

## 2019-12-02 DIAGNOSIS — Z23 Encounter for immunization: Secondary | ICD-10-CM | POA: Diagnosis not present

## 2019-12-12 ENCOUNTER — Telehealth: Payer: Self-pay | Admitting: Emergency Medicine

## 2019-12-12 ENCOUNTER — Telehealth: Payer: Self-pay | Admitting: *Deleted

## 2019-12-12 NOTE — Telephone Encounter (Signed)
PLEASE CALL PT. She should stop Protonix if she thinks it makes her feel bad. Switch to OTC TAGAMET OR PEPCID ONCE DAILY MON-FRI.

## 2019-12-12 NOTE — Telephone Encounter (Signed)
error 

## 2019-12-12 NOTE — Telephone Encounter (Signed)
Lmom for pt to call us back. 

## 2019-12-12 NOTE — Telephone Encounter (Signed)
Pt said that she is on Pantoprazole 40 mg. Pt said that she feels bad after taking medication.  Pt is having light headedness, unusual sweat, pain in left leg, diarrhea, and drowsiness. Pt wants to know if you can give her some recommendations on what she needs to do about side effects.  [phone]

## 2019-12-13 NOTE — Telephone Encounter (Signed)
Informed pt she should stop Protonix if she thinks it makes her feel bad per SLF. Pt was advised that she could switch to OTC TAGAMET OR PEPCID ONCE DAILY MON-FRI. Pt voiced understanding.

## 2019-12-16 DIAGNOSIS — K219 Gastro-esophageal reflux disease without esophagitis: Secondary | ICD-10-CM | POA: Diagnosis not present

## 2019-12-16 DIAGNOSIS — Z79899 Other long term (current) drug therapy: Secondary | ICD-10-CM | POA: Diagnosis not present

## 2019-12-16 DIAGNOSIS — M1991 Primary osteoarthritis, unspecified site: Secondary | ICD-10-CM | POA: Diagnosis not present

## 2019-12-16 DIAGNOSIS — Z6822 Body mass index (BMI) 22.0-22.9, adult: Secondary | ICD-10-CM | POA: Diagnosis not present

## 2019-12-16 DIAGNOSIS — C50219 Malignant neoplasm of upper-inner quadrant of unspecified female breast: Secondary | ICD-10-CM | POA: Diagnosis not present

## 2019-12-16 DIAGNOSIS — R300 Dysuria: Secondary | ICD-10-CM | POA: Diagnosis not present

## 2019-12-16 DIAGNOSIS — R413 Other amnesia: Secondary | ICD-10-CM | POA: Diagnosis not present

## 2019-12-16 DIAGNOSIS — N390 Urinary tract infection, site not specified: Secondary | ICD-10-CM | POA: Diagnosis not present

## 2019-12-20 ENCOUNTER — Encounter: Payer: Self-pay | Admitting: Orthopedic Surgery

## 2019-12-20 ENCOUNTER — Ambulatory Visit (INDEPENDENT_AMBULATORY_CARE_PROVIDER_SITE_OTHER): Payer: BC Managed Care – PPO | Admitting: Orthopedic Surgery

## 2019-12-20 ENCOUNTER — Other Ambulatory Visit: Payer: Self-pay

## 2019-12-20 VITALS — BP 131/82 | HR 95 | Ht 67.0 in | Wt 147.0 lb

## 2019-12-20 DIAGNOSIS — D649 Anemia, unspecified: Secondary | ICD-10-CM | POA: Insufficient documentation

## 2019-12-20 DIAGNOSIS — M65832 Other synovitis and tenosynovitis, left forearm: Secondary | ICD-10-CM | POA: Diagnosis not present

## 2019-12-20 MED ORDER — MELOXICAM 7.5 MG PO TABS: 7.5000 mg | ORAL_TABLET | Freq: Every day | ORAL | 5 refills | Status: DC

## 2019-12-20 NOTE — Patient Instructions (Signed)
Wear brace x 6 weeks Ben gay 3 x a day   Ice 3 x a day   New medication   meloxicam  You have received an injection of steroids into the joint. 15% of patients will have increased pain within the 24 hours postinjection.   This is transient and will go away.   We recommend that you use ice packs on the injection site for 20 minutes every 2 hours and extra strength Tylenol 2 tablets every 8 as needed until the pain resolves.  If you continue to have pain after taking the Tylenol and using the ice please call the office for further instructions.

## 2019-12-20 NOTE — Progress Notes (Signed)
Chief Complaint  Patient presents with  . 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 Anemia   . Arthritis   . Breast cancer ([place])    right breast cancer 5 yrs ago  . Cancer [facility])     right breast cancer  . 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   APPENDIX IN LUQ  . Depression   . Dysrhythmia    hx palpitations-too much caffiene  . Headache    Her MRI was March 2020 it was read as normal in terms of the wrist joint    BP 131/82   Pulse 95   Ht 5\' 7"  (1.702 m)   Wt 147 lb (66.7 kg)   LMP 12/06/2013 Comment: pt states she had a surgery and they inserted a balloon to slow her periods down. she has a light period every month  BMI 23.02 kg/m    Left wrist Tenderness noted as stated 2 cm proximal to the wrist the wrist joint has mild tenderness but most of the pain is between the second and third extensor compartments she has a negative Finkelstein's test.  No pain on the ulnar side of the wrist.  She does have some pain with wrist extension over the same area  There is no atrophy in the hand shows normal sensation color and capillary refill in the hand  Her previous images were normal in terms of x-ray I reviewed her MRI report is included  Encounter Diagnosis  Name Primary?  . Extensor intersection syndrome of left wrist Yes    I think she has intersection syndrome  Plan Injection.  Patient consented for injection verbally site was confirmed.  The site was cleaned with alcohol and ethyl chloride was used for anesthesia  one-to-one mixture of Depo-Medrol lidocaine 1% injected into the point of maximal tenderness   Wear brace x 6 weeks Ben gay 3 x a day   Ice 3 x a day   New medication   meloxicam Meds ordered this encounter  Medications  . meloxicam (MOBIC) 7.5 MG tablet    Sig: Take 1 tablet (7.5 mg total) by mouth daily.    Dispense:  30 tablet    Refill:  5    Return in 6 weeks

## 2019-12-24 ENCOUNTER — Ambulatory Visit: Payer: BC Managed Care – PPO | Admitting: Orthopedic Surgery

## 2020-01-03 NOTE — Pre-Procedure Instructions (Signed)
Consent order changed to EGD per Zara Council as Dr Oneida Alar notes state.

## 2020-01-03 NOTE — Pre-Procedure Instructions (Signed)
Patient booked as EGD and Dr Oneida Alar note says the same thing. Interoffice message to Zara Council because consent is posted as TCS.

## 2020-01-06 ENCOUNTER — Other Ambulatory Visit (HOSPITAL_COMMUNITY)
Admission: RE | Admit: 2020-01-06 | Discharge: 2020-01-06 | Disposition: A | Discharge status: Home / Self Care | Payer: BC Managed Care – PPO | Source: Ambulatory Visit | Attending: Gastroenterology | Admitting: Gastroenterology

## 2020-01-06 ENCOUNTER — Encounter (HOSPITAL_COMMUNITY)
Admission: RE | Admit: 2020-01-06 | Discharge: 2020-01-06 | Disposition: A | Discharge status: Home / Self Care | Payer: BC Managed Care – PPO | Source: Ambulatory Visit | Attending: Gastroenterology | Admitting: Gastroenterology

## 2020-01-06 ENCOUNTER — Other Ambulatory Visit: Payer: Self-pay

## 2020-01-06 ENCOUNTER — Encounter (HOSPITAL_COMMUNITY): Payer: Self-pay

## 2020-01-06 DIAGNOSIS — Z01812 Encounter for preprocedural laboratory examination: Secondary | ICD-10-CM | POA: Insufficient documentation

## 2020-01-06 DIAGNOSIS — Z20822 Contact with and (suspected) exposure to covid-19: Secondary | ICD-10-CM | POA: Diagnosis not present

## 2020-01-06 LAB — SARS CORONAVIRUS 2 (TAT 6-24 HRS): SARS Coronavirus 2: NEGATIVE

## 2020-01-06 NOTE — Patient Instructions (Signed)
[name]  01/06/2020     @PREFPERIOPPHARMACY @   Your procedure is scheduled on 01/07/2020.  Report to [facility] at 12:30 PM  Call this number if you have problems the morning of surgery:  [phone]   Remember:  Do not eat or drink after midnight.   Please follow instructions given to you by Dr. Oneida Alar office    Take these medicines the morning of surgery with A SIP OF WATER : Xanax, Flexeril, Lexapro and Hydrocodone    Do not wear jewelry, make-up or nail polish.  Do not wear lotions, powders, or perfumes, or deodorant.  Do not shave 48 hours prior to surgery.  Men may shave face and neck.  Do not bring valuables to the hospital.  [facility] is not responsible for any belongings or valuables.  Contacts, dentures or bridgework may not be worn into surgery.  Leave your suitcase in the car.  After surgery it may be brought to your room.  For patients admitted to the hospital, discharge time will be determined by your treatment team.  Patients discharged the day of surgery will not be allowed to drive home.   Name and phone number of your driver:   husband Special instructions:  n/a  Please read over the following fact sheets that you were given. Care and Recovery After Surgery  Upper Endoscopy, Adult Upper endoscopy is a procedure to look inside the upper GI (gastrointestinal) tract. The upper GI tract is made up of:  The part of the body that moves food from your mouth to your stomach (esophagus).  The stomach.  The first part of your small intestine (duodenum). This procedure is also called esophagogastroduodenoscopy (EGD) or gastroscopy. In this procedure, your health care provider passes a thin, flexible tube (endoscope) through your mouth and down your esophagus into your stomach. A small camera is attached to the end of the tube. Images from the camera appear on a monitor in the exam room. During this procedure, your health care provider may also remove a small piece  of tissue to be sent to a lab and examined under a microscope (biopsy). Your health care provider may do an upper endoscopy to diagnose cancers of the upper GI tract. You may also have this procedure to find the cause of other conditions, such as:  Stomach pain.  Heartburn.  Pain or problems when swallowing.  Nausea and vomiting.  Stomach bleeding.  Stomach ulcers. Tell a health care provider about:  Any allergies you have.  All medicines you are taking, including vitamins, herbs, eye drops, creams, and over-the-counter medicines.  Any problems you or family members have had with anesthetic medicines.  Any blood disorders you have.  Any surgeries you have had.  Any medical conditions you have.  Whether you are pregnant or may be pregnant. What are the risks? Generally, this is a safe procedure. However, problems may occur, including:  Infection.  Bleeding.  Allergic reactions to medicines.  A tear or hole (perforation) in the esophagus, stomach, or duodenum. What happens before the procedure? Staying hydrated Follow instructions from your health care provider about hydration, which may include:  Up to 2 hours before the procedure - you may continue to drink clear liquids, such as water, clear fruit juice, black coffee, and plain tea.  Eating and drinking restrictions Follow instructions from your health care provider about eating and drinking, which may include:  8 hours before the procedure - stop eating heavy meals or foods,  such as meat, fried foods, or fatty foods.  6 hours before the procedure - stop eating light meals or foods, such as toast or cereal.  6 hours before the procedure - stop drinking milk or drinks that contain milk.  2 hours before the procedure - stop drinking clear liquids. Medicines Ask your health care provider about:  Changing or stopping your regular medicines. This is especially important if you are taking diabetes medicines or  blood thinners.  Taking medicines such as aspirin and ibuprofen. These medicines can thin your blood. Do not take these medicines unless your health care provider tells you to take them.  Taking over-the-counter medicines, vitamins, herbs, and supplements. General instructions  Plan to have someone take you home from the hospital or clinic.  If you will be going home right after the procedure, plan to have someone with you for 24 hours.  Ask your health care provider what steps will be taken to help prevent infection. What happens during the procedure?   An IV will be inserted into one of your veins.  You may be given one or more of the following: ? A medicine to help you relax (sedative). ? A medicine to numb the throat (local anesthetic).  You will lie on your left side on an exam table.  Your health care provider will pass the endoscope through your mouth and down your esophagus.  Your health care provider will use the scope to check the inside of your esophagus, stomach, and duodenum. Biopsies may be taken.  The endoscope will be removed. The procedure may vary among health care providers and hospitals. What happens after the procedure?  Your blood pressure, heart rate, breathing rate, and blood oxygen level will be monitored until you leave the hospital or clinic.  Do not drive for 24 hours if you were given a sedative during your procedure.  When your throat is no longer numb, you may be given some fluids to drink.  It is up to you to get the results of your procedure. Ask your health care provider, or the department that is doing the procedure, when your results will be ready. Summary  Upper endoscopy is a procedure to look inside the upper GI tract.  During the procedure, an IV will be inserted into one of your veins. You may be given a medicine to help you relax.  A medicine will be used to numb your throat.  The endoscope will be passed through your mouth and  down your esophagus. This information is not intended to replace advice given to you by your health care provider. Make sure you discuss any questions you have with your health care provider. Document Revised: 02/14/2018 Document Reviewed: 01/22/2018 Elsevier Patient Education  [place].

## 2020-01-07 ENCOUNTER — Encounter (HOSPITAL_COMMUNITY): Payer: Self-pay | Admitting: Gastroenterology

## 2020-01-07 ENCOUNTER — Encounter (HOSPITAL_COMMUNITY)
Admission: RE | Disposition: A | Discharge status: Home / Self Care | Payer: Self-pay | Source: Home / Self Care | Attending: Gastroenterology

## 2020-01-07 ENCOUNTER — Ambulatory Visit (HOSPITAL_COMMUNITY): Payer: BC Managed Care – PPO | Admitting: Certified Registered Nurse Anesthetist

## 2020-01-07 ENCOUNTER — Ambulatory Visit (HOSPITAL_COMMUNITY)
Admission: RE | Admit: 2020-01-07 | Discharge: 2020-01-07 | Disposition: A | Discharge status: Home / Self Care | Payer: BC Managed Care – PPO | Attending: Gastroenterology | Admitting: Gastroenterology

## 2020-01-07 ENCOUNTER — Other Ambulatory Visit: Payer: Self-pay

## 2020-01-07 DIAGNOSIS — K2950 Unspecified chronic gastritis without bleeding: Secondary | ICD-10-CM | POA: Diagnosis not present

## 2020-01-07 DIAGNOSIS — K2970 Gastritis, unspecified, without bleeding: Secondary | ICD-10-CM | POA: Diagnosis not present

## 2020-01-07 DIAGNOSIS — R1310 Dysphagia, unspecified: Secondary | ICD-10-CM

## 2020-01-07 DIAGNOSIS — R1013 Epigastric pain: Secondary | ICD-10-CM | POA: Insufficient documentation

## 2020-01-07 DIAGNOSIS — F329 Major depressive disorder, single episode, unspecified: Secondary | ICD-10-CM | POA: Insufficient documentation

## 2020-01-07 DIAGNOSIS — M1990 Unspecified osteoarthritis, unspecified site: Secondary | ICD-10-CM | POA: Insufficient documentation

## 2020-01-07 DIAGNOSIS — K2960 Other gastritis without bleeding: Secondary | ICD-10-CM

## 2020-01-07 DIAGNOSIS — Q394 Esophageal web: Secondary | ICD-10-CM

## 2020-01-07 DIAGNOSIS — Z853 Personal history of malignant neoplasm of breast: Secondary | ICD-10-CM | POA: Insufficient documentation

## 2020-01-07 DIAGNOSIS — Z79899 Other long term (current) drug therapy: Secondary | ICD-10-CM | POA: Diagnosis not present

## 2020-01-07 DIAGNOSIS — Z791 Long term (current) use of non-steroidal anti-inflammatories (NSAID): Secondary | ICD-10-CM | POA: Diagnosis not present

## 2020-01-07 DIAGNOSIS — T39395A Adverse effect of other nonsteroidal anti-inflammatory drugs [NSAID], initial encounter: Secondary | ICD-10-CM

## 2020-01-07 DIAGNOSIS — F418 Other specified anxiety disorders: Secondary | ICD-10-CM | POA: Diagnosis not present

## 2020-01-07 HISTORY — PX: BIOPSY: SHX5522

## 2020-01-07 HISTORY — PX: ESOPHAGOGASTRODUODENOSCOPY (EGD) WITH PROPOFOL: SHX5813

## 2020-01-07 SURGERY — ESOPHAGOGASTRODUODENOSCOPY (EGD) WITH PROPOFOL
Anesthesia: General

## 2020-01-07 MED ORDER — LIDOCAINE VISCOUS HCL 2 % MT SOLN
15.0000 mL | Freq: Once | OROMUCOSAL | Status: AC
  Administered 2020-01-07: 15 mL via OROMUCOSAL

## 2020-01-07 MED ORDER — PROPOFOL 10 MG/ML IV BOLUS
INTRAVENOUS | Status: DC | PRN
  Administered 2020-01-07 (×4): 50 ug via INTRAVENOUS

## 2020-01-07 MED ORDER — LACTATED RINGERS IV SOLN: INTRAVENOUS | Status: DC

## 2020-01-07 MED ORDER — OMEPRAZOLE 20 MG PO CPDR: DELAYED_RELEASE_CAPSULE | ORAL | 5 refills | Status: DC

## 2020-01-07 MED ORDER — LIDOCAINE VISCOUS HCL 2 % MT SOLN
5.0000 mL | Freq: Once | OROMUCOSAL | Status: AC
  Administered 2020-01-07 (×2): 3 mL via OROMUCOSAL

## 2020-01-07 MED ORDER — LIDOCAINE VISCOUS HCL 2 % MT SOLN
OROMUCOSAL | Status: AC
  Filled 2020-01-07: qty 15

## 2020-01-07 MED ORDER — GLYCOPYRROLATE 0.2 MG/ML IJ SOLN
0.2000 mg | Freq: Once | INTRAMUSCULAR | Status: AC
  Administered 2020-01-07: 0.2 mg via INTRAVENOUS
  Filled 2020-01-07: qty 1

## 2020-01-07 MED ORDER — CHLORHEXIDINE GLUCONATE CLOTH 2 % EX PADS: 6.0000 | MEDICATED_PAD | Freq: Once | CUTANEOUS | Status: DC

## 2020-01-07 MED ORDER — PROPOFOL 500 MG/50ML IV EMUL
INTRAVENOUS | Status: DC | PRN
  Administered 2020-01-07: 300 ug/kg/min via INTRAVENOUS

## 2020-01-07 NOTE — Anesthesia Postprocedure Evaluation (Signed)
Anesthesia Post Note  Patient: [name]  Procedure(s) Performed: ESOPHAGOGASTRODUODENOSCOPY (EGD) WITH PROPOFOL (N/A ) BIOPSY  Anesthesia Type: General     Last Vitals:  Vitals:   01/07/20 1430 01/07/20 1445  BP: (!) 110/57 (!) 148/71  Pulse: 82 77  Resp: 13 16  Temp:    SpO2: 100% 100%    Last Pain:  Vitals:   01/07/20 1445  TempSrc:   PainSc: 0-No pain                 Louann Sjogren

## 2020-01-07 NOTE — Anesthesia Preprocedure Evaluation (Signed)
Anesthesia Evaluation  Patient identified by MRN, date of birth, ID band Patient awake    Reviewed: Allergy & Precautions, H&P , NPO status , Patient's Chart, lab work & pertinent test results, reviewed documented beta blocker date and time   Airway Mallampati: II  TM Distance: >3 FB Neck ROM: full    Dental no notable dental hx.    Pulmonary neg pulmonary ROS,    Pulmonary exam normal breath sounds clear to auscultation       Cardiovascular Exercise Tolerance: Good (-) dysrhythmias  Rhythm:regular Rate:Normal     Neuro/Psych  Headaches, PSYCHIATRIC DISORDERS Anxiety Depression    GI/Hepatic negative GI ROS, Neg liver ROS,   Endo/Other  negative endocrine ROS  Renal/GU negative Renal ROS  negative genitourinary   Musculoskeletal   Abdominal   Peds  Hematology  (+) Blood dyscrasia, anemia ,   Anesthesia Other Findings   Reproductive/Obstetrics negative OB ROS                             Anesthesia Physical Anesthesia Plan  ASA: III  Anesthesia Plan: General   Post-op Pain Management:    Induction:   PONV Risk Score and Plan: 2 and Propofol infusion  Airway Management Planned:   Additional Equipment:   Intra-op Plan:   Post-operative Plan:   Informed Consent: I have reviewed the patients History and Physical, chart, labs and discussed the procedure including the risks, benefits and alternatives for the proposed anesthesia with the patient or authorized representative who has indicated his/her understanding and acceptance.     Dental Advisory Given  Plan Discussed with: CRNA  Anesthesia Plan Comments:         Anesthesia Quick Evaluation

## 2020-01-07 NOTE — Transfer of Care (Signed)
Immediate Anesthesia Transfer of Care Note  Patient: [name]  Procedure(s) Performed: ESOPHAGOGASTRODUODENOSCOPY (EGD) WITH PROPOFOL (N/A ) BIOPSY  Patient Location: PACU  Anesthesia Type:General  Level of Consciousness: awake, alert  and oriented  Airway & Oxygen Therapy: Patient Spontanous Breathing  Post-op Assessment: Report given to RN, Post -op Vital signs reviewed and stable and Patient moving all extremities X 4  Post vital signs: Reviewed and stable  Last Vitals:  Vitals Value Taken Time  BP 107/52 01/07/20 1423  Temp 36.3 C 01/07/20 1424  Pulse 86 01/07/20 1425  Resp    SpO2 100 % 01/07/20 1425  Vitals shown include unvalidated device data.  Last Pain:  Vitals:   01/07/20 1403  TempSrc:   PainSc: 0-No pain      Patients Stated Pain Goal: 8 ([phone] [id number])  Complications: No apparent anesthesia complications

## 2020-01-07 NOTE — Discharge Instructions (Signed)
You have mild gastritis due to MELOXICAM. YOUR SMALL BOWEL LOOKED NORMAL. I biopsied your stomach and small bowel.    DRINK WATER TO KEEP YOUR URINE LIGHT YELLOW.  FOLLOW A LOW FAT DIET. MEATS SHOULD BE BAKED, BROILED, OR BOILED. AVOID FRIED FOODS.    START OMEPRAZOLE.  TAKE 30 MINUTES PRIOR TO YOUR FIRST MEAL EVERY DAY.  USE TAGAMET OR PEPCID WHEN NEEDED FOR UPSET STOMACH.   YOUR BIOPSY RESULTS WILL BE BACK IN 5 BUSINESS DAYS..   FOLLOW UP IN 4 MOS.  UPPER ENDOSCOPY AFTER CARE Read the instructions outlined below and refer to this sheet in the next week. These discharge instructions provide you with general information on caring for yourself after you leave the hospital. While your treatment has been planned according to the most current medical practices available, unavoidable complications occasionally occur. If you have any problems or questions after discharge, call DR. Terrah Decoster, [phone].  ACTIVITY  You may resume your regular activity, but move at a slower pace for the next 24 hours.   Take frequent rest periods for the next 24 hours.   Walking will help get rid of the air and reduce the bloated feeling in your belly (abdomen).   No driving for 24 hours (because of the medicine (anesthesia) used during the test).   You may shower.   Do not sign any important legal documents or operate any machinery for 24 hours (because of the anesthesia used during the test).    NUTRITION  Drink plenty of fluids.   You may resume your normal diet as instructed by your doctor.   Begin with a light meal and progress to your normal diet. Heavy or fried foods are harder to digest and may make you feel sick to your stomach (nauseated).   Avoid alcoholic beverages for 24 hours or as instructed.    MEDICATIONS  You may resume your normal medications.   WHAT YOU CAN EXPECT TODAY  Some feelings of bloating in the abdomen.   Passage of more gas than usual.    IF YOU HAD A  BIOPSY TAKEN DURING THE UPPER ENDOSCOPY:  Eat a soft diet IF YOU HAVE NAUSEA, BLOATING, ABDOMINAL PAIN, OR VOMITING.    FINDING OUT THE RESULTS OF YOUR TEST Not all test results are available during your visit. DR. Oneida Alar WILL CALL YOU WITHIN 14 DAYS OF YOUR PROCEDUE WITH YOUR RESULTS. Do not assume everything is normal if you have not heard from DR. Crimson Beer, CALL HER OFFICE AT [phone].  SEEK IMMEDIATE MEDICAL ATTENTION AND CALL THE OFFICE: [phone] IF:  You have more than a spotting of blood in your stool.   Your belly is swollen (abdominal distention).   You are nauseated or vomiting.   You have a temperature over 101F.   You have abdominal pain or discomfort that is severe or gets worse throughout the day.   Gastritis  Gastritis is an inflammation (the body's way of reacting to injury and/or infection) of the stomach. It is often caused by viral or bacterial (germ) infections. It can also be caused BY ASPIRIN, BC/GOODY POWDER'S, (IBUPROFEN) MOTRIN, OR ALEVE (NAPROXEN), chemicals (including alcohol), SPICY FOODS, and medications. This illness may be associated with generalized malaise (feeling tired, not well), UPPER ABDOMINAL STOMACH cramps, and fever. One common bacterial cause of gastritis is an organism known as H. Pylori. This can be treated with antibiotics.

## 2020-01-07 NOTE — H&P (Signed)
Primary Care Physician:  Redmond School, MD Primary Gastroenterologist:  Dr. Oneida Alar  Pre-Procedure History & Physical: HPI:  [name] is a 59 y.o. female here for DYSPEPSIA.  Past Medical History:  Diagnosis Date  . Anemia   . Arthritis   . Breast cancer ([place])    right breast cancer 5 yrs ago  . Cancer [facility])     right breast cancer  . 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   APPENDIX IN LUQ  . Depression   . Dysrhythmia    hx palpitations-too much caffiene  . Headache     Past Surgical History:  Procedure Laterality Date  . ANKLE SURGERY     left ankle cyst  . BACK SURGERY  2000   lumbar disc surgery  . BREAST EXCISIONAL BIOPSY Left    benign  . BREAST SURGERY     left partail mastectomy-cyst  . CERVICAL DISC SURGERY  1999   anterior and a posterier cerv fusionx2  . CESAREAN SECTION     x2  . COLONOSCOPY    . COLONOSCOPY WITH PROPOFOL N/A 06/26/2018   Procedure: COLONOSCOPY WITH PROPOFOL;  Surgeon: Danie Binder, MD;  Location: [facility] END[facility] SUITE;  Service: Endoscopy;  Laterality: N/A;  11:00am  . DILATION AND CURETTAGE OF UTERUS    . DILITATION & CURRETTAGE/HYSTROSCOPY WITH THERMACHOICE ABLATION  07/10/2012   Procedure: DILATATION & CURETTAGE/HYSTEROSCOPY WITH THERMACHOICE ABLATION;  Surgeon: [name], MD;  Location: [facility] ORS;  Service: Gynecology;  Laterality: N/A;  D5 53ml in, 13 ml out, temp 87degree celcius, total therapy time 68min 16sec  . ERCP    . POLYPECTOMY  06/26/2018   Procedure: POLYPECTOMY;  Surgeon: Danie Binder, MD;  Location: [facility];  Service: Endoscopy;;  colon  . TUBAL LIGATION     with last c-section    Prior to Admission medications   Medication Sig Start Date End Date Taking? Authorizing Provider  ALPRAZolam Duanne Moron) 0.5 MG tablet Take 0.5 mg by mouth as needed for anxiety.    Yes [provider]  amitriptyline (ELAVIL) 50 MG tablet Take 50 mg by mouth at bedtime.   Yes [provider]  calcium carbonate  (CALCIUM 600) 1500 (600 Ca) MG TABS tablet Take 600 mg of elemental calcium by mouth daily with breakfast.    Yes [provider]  Cholecalciferol (VITAMIN D3) 10 MCG (400 UNIT) CAPS Take 400 Units by mouth daily.    Yes [provider]  cyclobenzaprine (FLEXERIL) 10 MG tablet Take 10 mg by mouth daily as needed for muscle spasms.    Yes [provider]  escitalopram (LEXAPRO) 20 MG tablet Take 20 mg by mouth daily.   Yes [provider]  HYDROcodone-acetaminophen (NORCO) 10-325 MG tablet Take 1 tablet by mouth every 6 (six) hours as needed for moderate pain.    Yes [provider]  IBU 800 MG tablet Take 800 mg by mouth every 8 (eight) hours as needed for moderate pain.  02/01/18  Yes [provider]  MegaRed Omega-3 Krill Oil 500 MG CAPS Take 500 mg by mouth daily.    Yes [provider]  meloxicam (MOBIC) 7.5 MG tablet Take 1 tablet (7.5 mg total) by mouth daily. 12/20/19  Yes Carole Civil, MD  Multiple Vitamins-Minerals (MULTIVITAMIN WOMEN 50+ PO) Take 1 tablet by mouth daily.   Yes [provider]    Allergies as of 10/23/2019  . (No Known Allergies)    Family History  Problem  Relation Age of Onset  . Hypertension Mother   . Colon cancer Neg Hx   . Gastric cancer Neg Hx   . Esophageal cancer Neg Hx     Social History   Socioeconomic History  . Marital status: Married    Spouse name: Not on file  . Number of children: Not on file  . Years of education: Not on file  . Highest education level: Not on file  Occupational History  . Not on file  Tobacco Use  . Smoking status: Never Smoker  . Smokeless tobacco: Never Used  Substance and Sexual Activity  . Alcohol use: No  . Drug use: No  . Sexual activity: Yes    Birth control/protection: Surgical, Post-menopausal  Other Topics Concern  . Not on file  Social History Narrative  . Not on file   Social Determinants of Health   Financial Resource  Strain:   . Difficulty of Paying Living Expenses:   Food Insecurity:   . Worried About Charity fundraiser in the Last Year:   . Arboriculturist in the Last Year:   Transportation Needs:   . Film/video editor (Medical):   Marland Kitchen Lack of Transportation (Non-Medical):   Physical Activity:   . Days of Exercise per Week:   . Minutes of Exercise per Session:   Stress:   . Feeling of Stress :   Social Connections:   . Frequency of Communication with Friends and Family:   . Frequency of Social Gatherings with Friends and Family:   . Attends Religious Services:   . Active Member of Clubs or Organizations:   . Attends Archivist Meetings:   Marland Kitchen Marital Status:   Intimate Partner Violence:   . Fear of Current or Ex-Partner:   . Emotionally Abused:   Marland Kitchen Physically Abused:   . Sexually Abused:     Review of Systems: See HPI, otherwise negative ROS   Physical Exam: BP 133/83   Pulse 88   Temp 98.1 F (36.7 C) (Oral)   Resp 19   Ht 5\' 7"  (1.702 m)   Wt 69.9 kg   LMP 12/06/2013 Comment: pt states she had a surgery and they inserted a balloon to slow her periods down. she has a light period every month  SpO2 98%   BMI 24.12 kg/m  General:   Alert,  pleasant and cooperative in NAD Head:  Normocephalic and atraumatic. Neck:  Supple; Lungs:  Clear throughout to auscultation.    Heart:  Regular rate and rhythm. Abdomen:  Soft, nontender and nondistended. Normal bowel sounds, without guarding, and without rebound.   Neurologic:  Alert and  oriented x4;  grossly normal neurologically.  Impression/Plan:   DYSPEPSIA  PLAN: EGD TODAY.  DISCUSSED PROCEDURE, BENEFITS, & RISKS: < 1% chance of medication reaction, bleeding, perforation, or ASPIRATION.

## 2020-01-07 NOTE — Op Note (Addendum)
[facility] Patient Name: [name] Procedure Date: 01/07/2020 1:37 PM MRN: [id number] Date of Birth: [birth year] Attending MD: Barney Drain MD, MD CSN: [id number] Age: 59 Admit Type: Outpatient Procedure:                Upper GI endoscopy WITH COLD FORCEPS BIOPSY Indications:              Dyspepsia Providers:                Barney Drain MD, MD, Janeece Riggers, RN, Randa Spike, Technician Referring MD:             [employer], MD Medicines:                Propofol per Anesthesia Complications:            No immediate complications. Estimated Blood Loss:     Estimated blood loss was minimal. Procedure:                Pre-Anesthesia Assessment:                           - Prior to the procedure, a History and Physical                            was performed, and patient medications and                            allergies were reviewed. The patient's tolerance of                            previous anesthesia was also reviewed. The risks                            and benefits of the procedure and the sedation                            options and risks were discussed with the patient.                            All questions were answered, and informed consent                            was obtained. Prior Anticoagulants: The patient has                            taken no previous anticoagulant or antiplatelet                            agents except for NSAID medication. ASA Grade                            Assessment: II - A patient with mild systemic  disease. After reviewing the risks and benefits,                            the patient was deemed in satisfactory condition to                            undergo the procedure. After obtaining informed                            consent, the endoscope was passed under direct                            vision. Throughout the procedure, the patient's    blood pressure, pulse, and oxygen saturations were                            monitored continuously. The [id number]) was                            introduced through the mouth, and advanced to the                            second part of duodenum. The upper GI endoscopy was                            accomplished without difficulty. The patient                            tolerated the procedure well. Scope In: 2:09:04 PM Scope Out: 2:16:43 PM Total Procedure Duration: 0 hours 7 minutes 39 seconds  Findings:      Localized mild inflammation characterized by congestion (edema) and       erythema was found in the gastric antrum.       Biopsies(2:BODY,1:INCISURA,2:ANTRUM) were taken with a cold forceps for       histology.      The examined duodenum was normal.      Biopsies were taken with a cold forceps for histology.      The examined esophagus was normal. Impression:               - MILD Gastritis. Biopsied. Moderate Sedation:      Per Anesthesia Care Recommendation:           - Patient has a contact number available for                            emergencies. The signs and symptoms of potential                            delayed complications were discussed with the                            patient. Return to normal activities tomorrow.                            Written discharge instructions were provided to  the                            patient.                           - Low fat diet. .                           - Continue present medications. START OMEPRAZOLE 30                            MINS PRIOR TO FIRST MEAL. PEPCID OR TAGAMET PRN                           - Await pathology results.                           - Return to GI office in 4 months. Procedure Code(s):        --- Professional ---                           [id number], Esophagogastroduodenoscopy, flexible,                            transoral; with insertion of guide wire followed by                             passage of dilator(s) through esophagus over guide                            wire                           43239, 59, Esophagogastroduodenoscopy, flexible,                            transoral; with biopsy, single or multiple Diagnosis Code(s):        --- Professional ---                           Q39.4, Esophageal web                           K29.70, Gastritis, unspecified, without bleeding                           R13.10, Dysphagia, unspecified CPT copyright 2019 American Medical Association. All rights reserved. The codes documented in this report are preliminary and upon coder review may  be revised to meet current compliance requirements. Barney Drain, MD Barney Drain MD, MD 01/07/2020 3:31:50 PM This report has been signed electronically. Number of Addenda: 0

## 2020-01-07 NOTE — Anesthesia Procedure Notes (Signed)
Date/Time: 01/07/2020 1:55 PM Performed by: Lyda Jester, CRNA Pre-anesthesia Checklist: Patient identified, Emergency Drugs available, Suction available, Patient being monitored and Timeout performed Patient Re-evaluated:Patient Re-evaluated prior to induction Oxygen Delivery Method: Nasal cannula

## 2020-01-09 ENCOUNTER — Other Ambulatory Visit: Payer: Self-pay

## 2020-01-09 LAB — SURGICAL PATHOLOGY

## 2020-01-10 ENCOUNTER — Ambulatory Visit: Payer: BC Managed Care – PPO | Admitting: Obstetrics and Gynecology

## 2020-01-20 ENCOUNTER — Telehealth: Payer: Self-pay | Admitting: Orthopedic Surgery

## 2020-01-20 NOTE — Telephone Encounter (Signed)
Patient called - states she is at an appointment with her husband at [facility] / Dr Amedeo Plenty; said she would like to have notes faxed to this clinic regarding her left wrist care, to have a second opinion. Relayed signed authorization/relase form is needed - faxed form to clinic at fax# [phone]. Patient aware if does not reach in time while she is there to come to our clinic to sign, in order for notes, reports to be released.

## 2020-01-20 NOTE — Telephone Encounter (Signed)
Faxed as requested

## 2020-01-20 NOTE — Telephone Encounter (Signed)
Patient came to office to sign release to be faxed to [facility] as per request.

## 2020-01-31 DIAGNOSIS — Z6822 Body mass index (BMI) 22.0-22.9, adult: Secondary | ICD-10-CM | POA: Diagnosis not present

## 2020-01-31 DIAGNOSIS — Z13220 Encounter for screening for lipoid disorders: Secondary | ICD-10-CM | POA: Diagnosis not present

## 2020-01-31 DIAGNOSIS — F330 Major depressive disorder, recurrent, mild: Secondary | ICD-10-CM | POA: Diagnosis not present

## 2020-01-31 DIAGNOSIS — E782 Mixed hyperlipidemia: Secondary | ICD-10-CM | POA: Diagnosis not present

## 2020-02-05 ENCOUNTER — Ambulatory Visit: Payer: BC Managed Care – PPO | Admitting: Orthopedic Surgery

## 2020-02-05 ENCOUNTER — Encounter: Payer: Self-pay | Admitting: Orthopedic Surgery

## 2020-02-10 ENCOUNTER — Telehealth: Payer: Self-pay | Admitting: Orthopedic Surgery

## 2020-02-10 NOTE — Telephone Encounter (Addendum)
We received fax confirmation that [name] has an appointment scheduled at [facility]/Dr Gramig on 02/13/20 at 3:15

## 2020-02-13 DIAGNOSIS — M25532 Pain in left wrist: Secondary | ICD-10-CM | POA: Insufficient documentation

## 2020-02-19 ENCOUNTER — Ambulatory Visit: Payer: Medicare Other | Admitting: Obstetrics and Gynecology

## 2020-02-25 ENCOUNTER — Ambulatory Visit: Payer: Medicare Other | Admitting: Gastroenterology

## 2020-02-26 DIAGNOSIS — M25532 Pain in left wrist: Secondary | ICD-10-CM | POA: Diagnosis not present

## 2020-03-02 DIAGNOSIS — M25532 Pain in left wrist: Secondary | ICD-10-CM | POA: Diagnosis not present

## 2020-03-06 DIAGNOSIS — Z6821 Body mass index (BMI) 21.0-21.9, adult: Secondary | ICD-10-CM | POA: Diagnosis not present

## 2020-03-06 DIAGNOSIS — F320 Major depressive disorder, single episode, mild: Secondary | ICD-10-CM | POA: Diagnosis not present

## 2020-03-17 DIAGNOSIS — G5642 Causalgia of left upper limb: Secondary | ICD-10-CM | POA: Diagnosis not present

## 2020-03-17 DIAGNOSIS — G8918 Other acute postprocedural pain: Secondary | ICD-10-CM | POA: Diagnosis not present

## 2020-03-17 DIAGNOSIS — Z4789 Encounter for other orthopedic aftercare: Secondary | ICD-10-CM | POA: Insufficient documentation

## 2020-03-17 DIAGNOSIS — M659 Synovitis and tenosynovitis, unspecified: Secondary | ICD-10-CM | POA: Diagnosis not present

## 2020-03-17 DIAGNOSIS — M67432 Ganglion, left wrist: Secondary | ICD-10-CM | POA: Diagnosis not present

## 2020-03-17 DIAGNOSIS — M25832 Other specified joint disorders, left wrist: Secondary | ICD-10-CM | POA: Diagnosis not present

## 2020-03-17 DIAGNOSIS — D481 Neoplasm of uncertain behavior of connective and other soft tissue: Secondary | ICD-10-CM | POA: Diagnosis not present

## 2020-03-17 DIAGNOSIS — M67832 Other specified disorders of synovium, left wrist: Secondary | ICD-10-CM | POA: Diagnosis not present

## 2020-03-23 ENCOUNTER — Ambulatory Visit: Payer: Medicare Other | Admitting: Obstetrics and Gynecology

## 2020-03-23 DIAGNOSIS — R2230 Localized swelling, mass and lump, unspecified upper limb: Secondary | ICD-10-CM | POA: Insufficient documentation

## 2020-03-23 DIAGNOSIS — M25532 Pain in left wrist: Secondary | ICD-10-CM | POA: Diagnosis not present

## 2020-03-24 ENCOUNTER — Ambulatory Visit: Payer: Medicare Other | Admitting: Obstetrics and Gynecology

## 2020-03-25 DIAGNOSIS — Z6821 Body mass index (BMI) 21.0-21.9, adult: Secondary | ICD-10-CM | POA: Diagnosis not present

## 2020-03-25 DIAGNOSIS — F321 Major depressive disorder, single episode, moderate: Secondary | ICD-10-CM | POA: Diagnosis not present

## 2020-03-26 ENCOUNTER — Other Ambulatory Visit (HOSPITAL_COMMUNITY): Payer: Self-pay | Admitting: Obstetrics and Gynecology

## 2020-03-26 DIAGNOSIS — Z1231 Encounter for screening mammogram for malignant neoplasm of breast: Secondary | ICD-10-CM

## 2020-03-30 DIAGNOSIS — R2232 Localized swelling, mass and lump, left upper limb: Secondary | ICD-10-CM | POA: Diagnosis not present

## 2020-03-30 DIAGNOSIS — M25532 Pain in left wrist: Secondary | ICD-10-CM | POA: Diagnosis not present

## 2020-03-30 DIAGNOSIS — Z4789 Encounter for other orthopedic aftercare: Secondary | ICD-10-CM | POA: Diagnosis not present

## 2020-04-07 DIAGNOSIS — M25532 Pain in left wrist: Secondary | ICD-10-CM | POA: Diagnosis not present

## 2020-04-17 DIAGNOSIS — M25532 Pain in left wrist: Secondary | ICD-10-CM | POA: Diagnosis not present

## 2020-05-07 DIAGNOSIS — Z0000 Encounter for general adult medical examination without abnormal findings: Secondary | ICD-10-CM | POA: Diagnosis not present

## 2020-05-07 DIAGNOSIS — R7611 Nonspecific reaction to tuberculin skin test without active tuberculosis: Secondary | ICD-10-CM | POA: Diagnosis not present

## 2020-05-07 DIAGNOSIS — I10 Essential (primary) hypertension: Secondary | ICD-10-CM | POA: Diagnosis not present

## 2020-05-07 DIAGNOSIS — Z1389 Encounter for screening for other disorder: Secondary | ICD-10-CM | POA: Diagnosis not present

## 2020-05-07 DIAGNOSIS — M5030 Other cervical disc degeneration, unspecified cervical region: Secondary | ICD-10-CM | POA: Diagnosis not present

## 2020-05-07 DIAGNOSIS — F419 Anxiety disorder, unspecified: Secondary | ICD-10-CM | POA: Diagnosis not present

## 2020-05-07 DIAGNOSIS — D493 Neoplasm of unspecified behavior of breast: Secondary | ICD-10-CM | POA: Diagnosis not present

## 2020-05-07 DIAGNOSIS — Z6820 Body mass index (BMI) 20.0-20.9, adult: Secondary | ICD-10-CM | POA: Diagnosis not present

## 2020-05-27 ENCOUNTER — Ambulatory Visit: Payer: Medicare Other | Admitting: Gastroenterology

## 2020-05-28 ENCOUNTER — Other Ambulatory Visit: Payer: Medicare Other | Admitting: Obstetrics and Gynecology

## 2020-06-11 DIAGNOSIS — F419 Anxiety disorder, unspecified: Secondary | ICD-10-CM | POA: Diagnosis not present

## 2020-06-11 DIAGNOSIS — G894 Chronic pain syndrome: Secondary | ICD-10-CM | POA: Diagnosis not present

## 2020-06-11 DIAGNOSIS — Z6821 Body mass index (BMI) 21.0-21.9, adult: Secondary | ICD-10-CM | POA: Diagnosis not present

## 2020-06-11 DIAGNOSIS — D493 Neoplasm of unspecified behavior of breast: Secondary | ICD-10-CM | POA: Diagnosis not present

## 2020-06-11 DIAGNOSIS — Z23 Encounter for immunization: Secondary | ICD-10-CM | POA: Diagnosis not present

## 2020-06-11 DIAGNOSIS — I10 Essential (primary) hypertension: Secondary | ICD-10-CM | POA: Diagnosis not present

## 2020-06-18 ENCOUNTER — Ambulatory Visit (INDEPENDENT_AMBULATORY_CARE_PROVIDER_SITE_OTHER): Payer: BC Managed Care – PPO | Admitting: Adult Health

## 2020-06-18 ENCOUNTER — Encounter: Payer: Self-pay | Admitting: Adult Health

## 2020-06-18 ENCOUNTER — Other Ambulatory Visit: Payer: Self-pay

## 2020-06-18 ENCOUNTER — Other Ambulatory Visit (HOSPITAL_COMMUNITY)
Admission: RE | Admit: 2020-06-18 | Discharge: 2020-06-18 | Disposition: A | Discharge status: Home / Self Care | Payer: BC Managed Care – PPO | Source: Ambulatory Visit | Attending: Adult Health | Admitting: Adult Health

## 2020-06-18 VITALS — BP 113/76 | HR 97 | Ht 67.0 in | Wt 138.6 lb

## 2020-06-18 DIAGNOSIS — Z1211 Encounter for screening for malignant neoplasm of colon: Secondary | ICD-10-CM

## 2020-06-18 DIAGNOSIS — D0511 Intraductal carcinoma in situ of right breast: Secondary | ICD-10-CM

## 2020-06-18 DIAGNOSIS — Z01419 Encounter for gynecological examination (general) (routine) without abnormal findings: Secondary | ICD-10-CM | POA: Insufficient documentation

## 2020-06-18 LAB — HEMOCCULT GUIAC POC 1CARD (OFFICE): Fecal Occult Blood, POC: NEGATIVE

## 2020-06-18 NOTE — Progress Notes (Signed)
Patient ID: [name], female   DOB: 1961-01-30, 59 y.o.   MRN: [id number] History of Present Illness: [name] is a 59 year old black female,married, PM in for a well woamn gyn exam and pap. PCP is Dr Gerarda Fraction.   Current Medications, Allergies, Past Medical History, Past Surgical History, Family History and Social History were reviewed in [facility] record.     Review of Systems:  Patient denies any headaches, hearing loss, fatigue, blurred vision, shortness of breath, chest pain, abdominal pain, problems with bowel movements, urination, or intercourse. No joint pain or mood swings. She denies any vaginal bleeding  Physical Exam:  BP 113/76 (BP Location: Left Arm, Patient Position: Sitting, Cuff Size: Normal)   Pulse 97   Ht 5\' 7"  (1.702 m)   Wt 138 lb 9.6 oz (62.9 kg)   LMP 12/06/2013   BMI 21.71 kg/m  General:  Well developed, well nourished, no acute distress Skin:  Warm and dry Neck:  Midline trachea, normal thyroid, good ROM, no lymphadenopathy, no carotid bruits heard Lungs; Clear to auscultation bilaterally Breast:  No dominant palpable mass, retraction, or nipple discharge,has scar right breast from lumpectomy  Cardiovascular: Regular rate and rhythm Abdomen:  Soft, non tender, no hepatosplenomegaly Pelvic:  External genitalia is normal in appearance, no lesions.  The vagina is pale with loss of rugae, fair moisture. Urethra has no lesions or masses. The cervix is smooth, deviated to the left with stenotic os,pap wit high risk HPV genotyping performed.  Uterus is felt to be normal size, shape, and contour.  No adnexal masses or tenderness noted.Bladder is non tender, no masses felt. Rectal: Good sphincter tone, no polyps, or hemorrhoids felt.  Hemoccult negative. Extremities/musculoskeletal:  No swelling or varicosities noted, no clubbing or cyanosis Psych:  No mood changes, alert and cooperative,seems happy AA is 0 Fall risk is low PHQ 9 score is 0    [facility] - 06/18/20 1008      Pregnancy Intention Screening   Does the patient want to become pregnant in the next year? No    Does the patient's partner want to become pregnant in the next year? No    Would the patient like to discuss contraceptive options today? No      Contraception Wrap Up   Current Method No Method - Other Reason   PM   End Method No Method - Other Reason   PM   Contraception Counseling Provided No         Examination chaperoned by Celene Squibb LPN  Impression and Plan: 1. Encounter for gynecological examination with Papanicolaou smear of cervix Pap sent -physical with PCP next year Pap in 3 years if normal Labs with PCP Get mammogram 06/12/20 -colonoscopy per GI  2. Encounter for screening fecal occult blood testing   3. Cancer of breast, intraductal, right Get yearly mammograms

## 2020-06-19 LAB — CYTOLOGY - PAP
Comment: NEGATIVE
Diagnosis: NEGATIVE
High risk HPV: NEGATIVE

## 2020-06-22 ENCOUNTER — Ambulatory Visit (HOSPITAL_COMMUNITY)
Admission: RE | Admit: 2020-06-22 | Discharge: 2020-06-22 | Disposition: A | Discharge status: Home / Self Care | Payer: BC Managed Care – PPO | Source: Ambulatory Visit | Attending: Obstetrics and Gynecology | Admitting: Obstetrics and Gynecology

## 2020-06-22 ENCOUNTER — Other Ambulatory Visit: Payer: Self-pay

## 2020-06-22 DIAGNOSIS — Z1231 Encounter for screening mammogram for malignant neoplasm of breast: Secondary | ICD-10-CM | POA: Diagnosis not present

## 2020-08-07 DIAGNOSIS — F329 Major depressive disorder, single episode, unspecified: Secondary | ICD-10-CM | POA: Diagnosis not present

## 2020-08-07 DIAGNOSIS — E7849 Other hyperlipidemia: Secondary | ICD-10-CM | POA: Diagnosis not present

## 2020-08-07 DIAGNOSIS — F419 Anxiety disorder, unspecified: Secondary | ICD-10-CM | POA: Diagnosis not present

## 2020-08-07 DIAGNOSIS — I10 Essential (primary) hypertension: Secondary | ICD-10-CM | POA: Diagnosis not present

## 2020-08-07 DIAGNOSIS — Z6822 Body mass index (BMI) 22.0-22.9, adult: Secondary | ICD-10-CM | POA: Diagnosis not present

## 2020-10-23 DIAGNOSIS — R413 Other amnesia: Secondary | ICD-10-CM | POA: Diagnosis not present

## 2020-10-23 DIAGNOSIS — E559 Vitamin D deficiency, unspecified: Secondary | ICD-10-CM | POA: Diagnosis not present

## 2020-10-23 DIAGNOSIS — M542 Cervicalgia: Secondary | ICD-10-CM | POA: Diagnosis not present

## 2020-10-23 DIAGNOSIS — Z6822 Body mass index (BMI) 22.0-22.9, adult: Secondary | ICD-10-CM | POA: Diagnosis not present

## 2020-11-04 IMAGING — MG DIGITAL SCREENING BILAT W/ TOMO W/ CAD
6 of 10 series · 6 of 30 positions shown · non-contrast
Comparison: Previous exam(s).

CLINICAL DATA: Screening.

EXAM:
DIGITAL SCREENING BILATERAL MAMMOGRAM WITH TOMO AND CAD

[L CC synth-2D (1 of 2)]
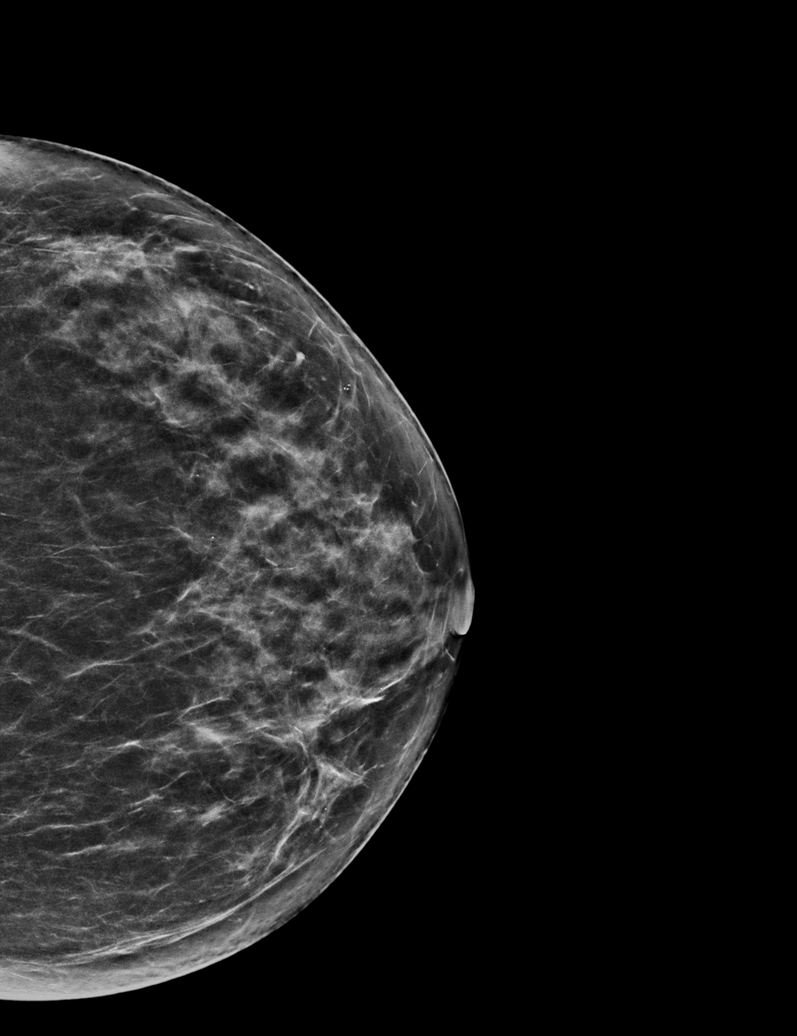

[L CC synth-2D (2 of 2)]
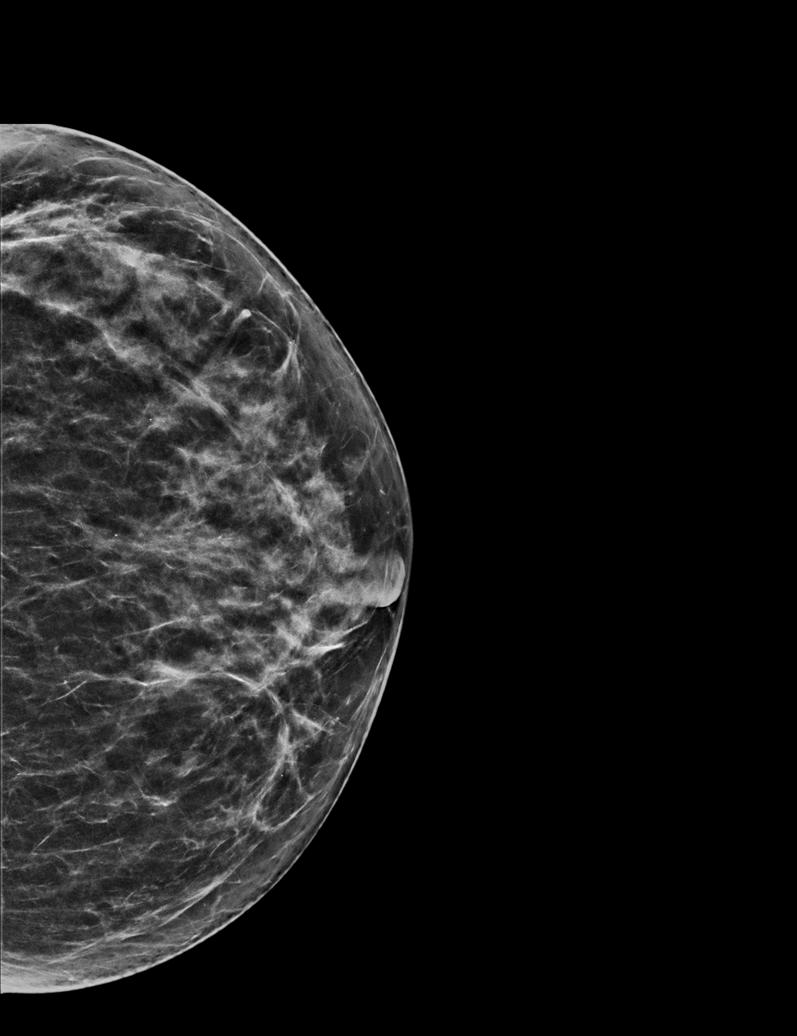

[R MLO synth-2D]
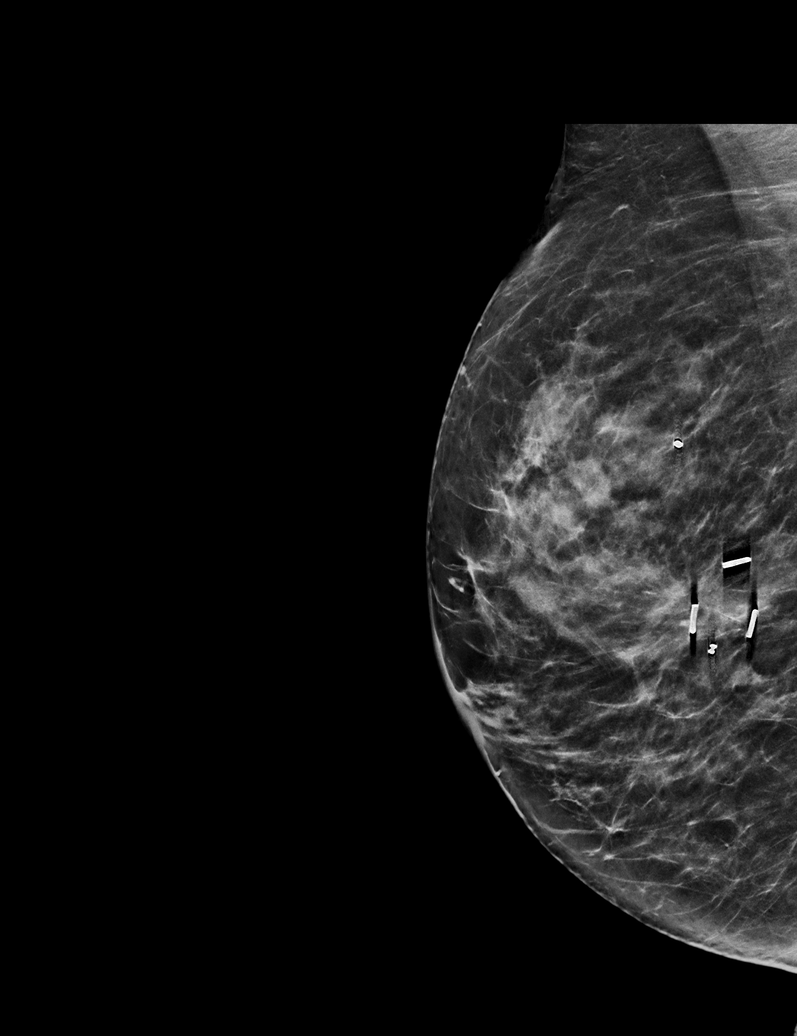

[R CC synth-2D]
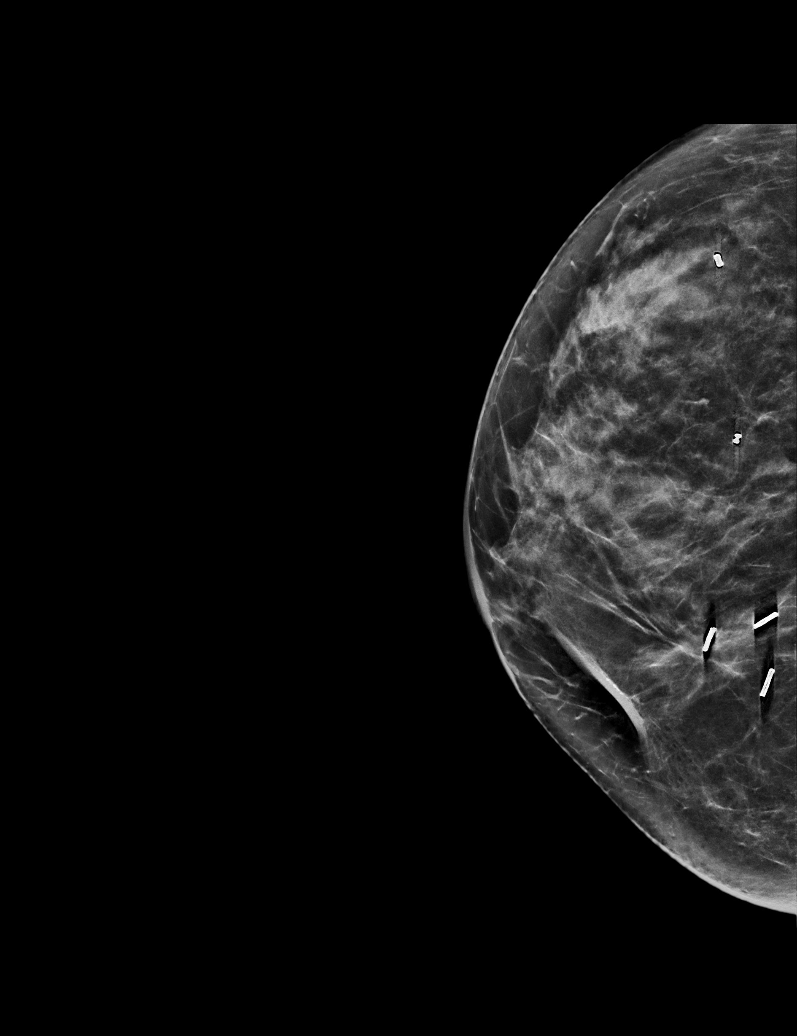

[L MLO synth-2D]
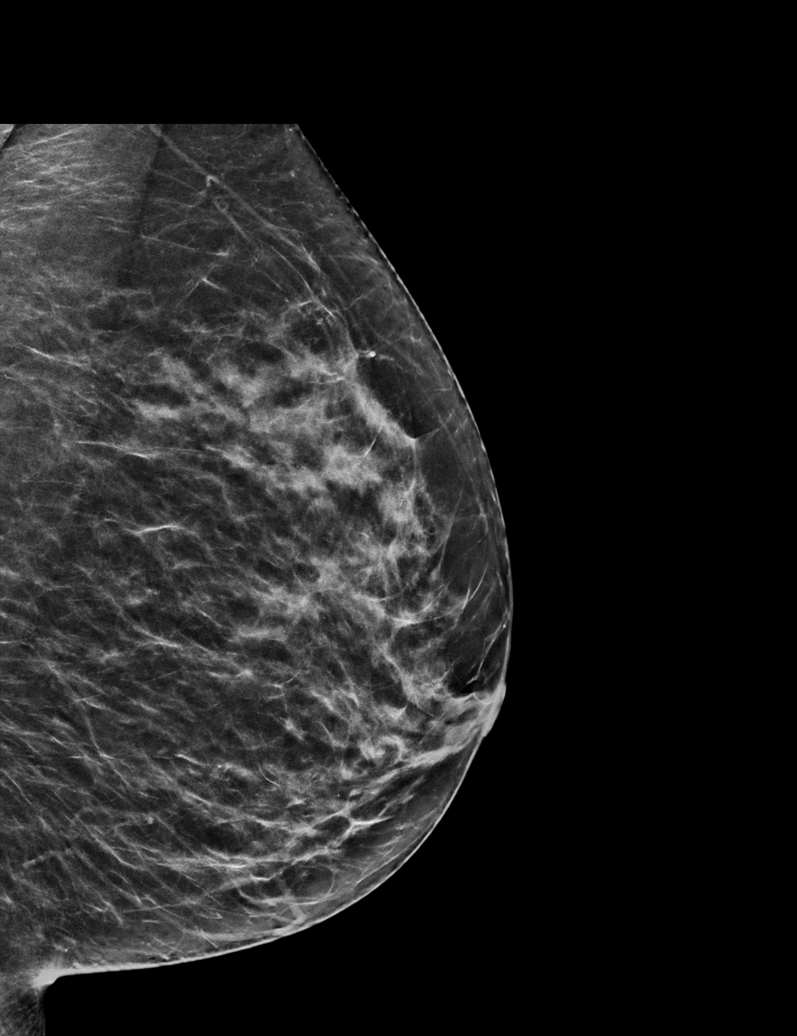

[L MLO tomo · tomo slice 31/60.0]
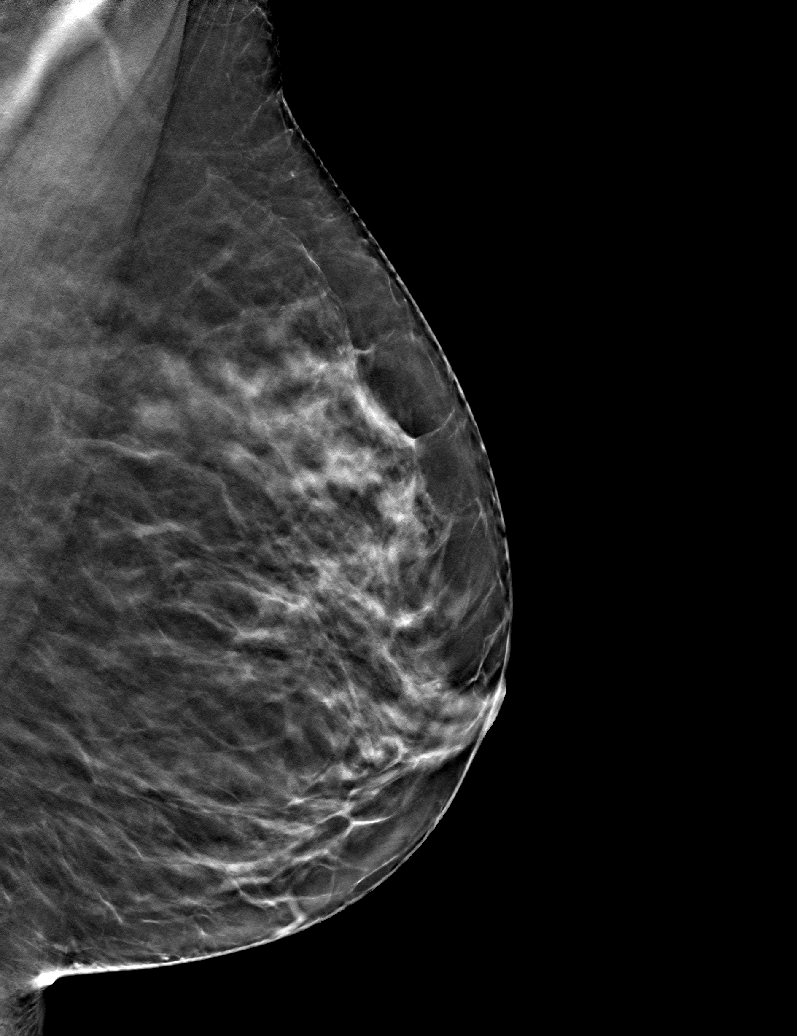

[6 of 30 positions shown; findings below may reference images not displayed]

ACR Breast Density Category c: The breast tissue is heterogeneously
dense, which may obscure small masses.
FINDINGS: There are no findings suspicious for malignancy. Images were
processed with CAD.
IMPRESSION: No mammographic evidence of malignancy. A result letter of this
screening mammogram will be mailed directly to the patient.

RECOMMENDATION:
Screening mammogram in one year. (Code:FT-U-LHB)

BI-RADS CATEGORY  1: Negative.

## 2021-01-28 DIAGNOSIS — F419 Anxiety disorder, unspecified: Secondary | ICD-10-CM | POA: Diagnosis not present

## 2021-01-28 DIAGNOSIS — M1991 Primary osteoarthritis, unspecified site: Secondary | ICD-10-CM | POA: Diagnosis not present

## 2021-01-28 DIAGNOSIS — Z6822 Body mass index (BMI) 22.0-22.9, adult: Secondary | ICD-10-CM | POA: Diagnosis not present

## 2021-01-28 DIAGNOSIS — I10 Essential (primary) hypertension: Secondary | ICD-10-CM | POA: Diagnosis not present

## 2021-03-04 DIAGNOSIS — F419 Anxiety disorder, unspecified: Secondary | ICD-10-CM | POA: Diagnosis not present

## 2021-03-04 DIAGNOSIS — Z6823 Body mass index (BMI) 23.0-23.9, adult: Secondary | ICD-10-CM | POA: Diagnosis not present

## 2021-03-26 DIAGNOSIS — N3000 Acute cystitis without hematuria: Secondary | ICD-10-CM | POA: Diagnosis not present

## 2021-03-26 DIAGNOSIS — C50211 Malignant neoplasm of upper-inner quadrant of right female breast: Secondary | ICD-10-CM | POA: Diagnosis not present

## 2021-03-26 DIAGNOSIS — Z6822 Body mass index (BMI) 22.0-22.9, adult: Secondary | ICD-10-CM | POA: Diagnosis not present

## 2021-03-26 DIAGNOSIS — I10 Essential (primary) hypertension: Secondary | ICD-10-CM | POA: Diagnosis not present

## 2021-03-26 DIAGNOSIS — G5702 Lesion of sciatic nerve, left lower limb: Secondary | ICD-10-CM | POA: Diagnosis not present

## 2021-03-29 ENCOUNTER — Other Ambulatory Visit (HOSPITAL_COMMUNITY): Payer: Self-pay | Admitting: Internal Medicine

## 2021-03-29 DIAGNOSIS — Z1231 Encounter for screening mammogram for malignant neoplasm of breast: Secondary | ICD-10-CM

## 2021-04-13 ENCOUNTER — Ambulatory Visit (INDEPENDENT_AMBULATORY_CARE_PROVIDER_SITE_OTHER): Payer: Medicare Other | Admitting: Adult Health

## 2021-04-13 ENCOUNTER — Other Ambulatory Visit: Payer: Self-pay

## 2021-04-13 ENCOUNTER — Encounter: Payer: Self-pay | Admitting: Adult Health

## 2021-04-13 VITALS — BP 114/79 | HR 91 | Ht 67.0 in | Wt 146.5 lb

## 2021-04-13 DIAGNOSIS — N6321 Unspecified lump in the left breast, upper outer quadrant: Secondary | ICD-10-CM

## 2021-04-13 NOTE — Progress Notes (Signed)
  Subjective:     Patient ID: [name], female   DOB: Sep 04, 1961, 60 y.o.   MRN: [id number]  [name] is a 60 year old black female, married, PM in complaining of left breast mass for a bout a week. Lab Results  Component Value Date   DIAGPAP  06/18/2020    - Negative for intraepithelial lesion or malignancy (NILM)   HPV NOT DETECTED 12/21/2016   [name] Negative 06/18/2020   PCP is Dr [name].  Review of Systems Left breast mass for about week Reviewed past medical,surgical, social and family history. Reviewed medications and allergies.     Objective:   Physical Exam BP 114/79 (BP Location: Left Arm, Patient Position: Sitting, Cuff Size: Normal)   Pulse 91   Ht '5\' 7"'$  (1.702 m)   Wt 146 lb 8 oz (66.5 kg)   LMP 12/06/2013   BMI 22.95 kg/m   Skin warm and dry,  Breasts:no dominate palpable mass, retraction or nipple discharge    On the right, on the left, no retraction or nipple discharge, has 2 cm mass at 2 0' clock, about 4 FB from areola that is tender, she has scar near this area, and some fibroglandular tissue.  Fall risk is low  [facility] - 04/13/21 0958       Pregnancy Intention Screening   Does the patient want to become pregnant in the next year? No    Does the patient's partner want to become pregnant in the next year? No    Would the patient like to discuss contraceptive options today? No      Contraception Wrap Up   Current Method Female Sterilization    End Method Female Sterilization    Contraception Counseling Provided No             Assessment:     1. Mass of upper outer quadrant of left breast Scheduled left diagnostic mammogram and left breast US at [facility] at 10:40 am on 04/29/21    Plan:     Follow up prn

## 2021-04-19 DIAGNOSIS — M5136 Other intervertebral disc degeneration, lumbar region: Secondary | ICD-10-CM | POA: Diagnosis not present

## 2021-04-19 DIAGNOSIS — M545 Low back pain, unspecified: Secondary | ICD-10-CM | POA: Insufficient documentation

## 2021-04-19 DIAGNOSIS — M5417 Radiculopathy, lumbosacral region: Secondary | ICD-10-CM | POA: Diagnosis not present

## 2021-04-27 ENCOUNTER — Ambulatory Visit: Payer: BLUE CROSS/BLUE SHIELD | Admitting: Neurology

## 2021-04-29 ENCOUNTER — Other Ambulatory Visit: Payer: Self-pay

## 2021-04-29 ENCOUNTER — Ambulatory Visit (HOSPITAL_COMMUNITY)
Admission: RE | Admit: 2021-04-29 | Discharge: 2021-04-29 | Disposition: A | Discharge status: Home / Self Care | Payer: Medicare Other | Source: Ambulatory Visit | Attending: Adult Health | Admitting: Adult Health

## 2021-04-29 DIAGNOSIS — R922 Inconclusive mammogram: Secondary | ICD-10-CM | POA: Diagnosis not present

## 2021-04-29 DIAGNOSIS — N6321 Unspecified lump in the left breast, upper outer quadrant: Secondary | ICD-10-CM | POA: Insufficient documentation

## 2021-05-03 DIAGNOSIS — M545 Low back pain, unspecified: Secondary | ICD-10-CM | POA: Diagnosis not present

## 2021-05-06 ENCOUNTER — Other Ambulatory Visit: Payer: Self-pay | Admitting: Internal Medicine

## 2021-05-06 DIAGNOSIS — N63 Unspecified lump in unspecified breast: Secondary | ICD-10-CM

## 2021-05-11 DIAGNOSIS — M545 Low back pain, unspecified: Secondary | ICD-10-CM | POA: Diagnosis not present

## 2021-05-18 ENCOUNTER — Other Ambulatory Visit: Payer: Self-pay | Admitting: Orthopedic Surgery

## 2021-05-18 DIAGNOSIS — G8929 Other chronic pain: Secondary | ICD-10-CM

## 2021-05-18 DIAGNOSIS — M545 Low back pain, unspecified: Secondary | ICD-10-CM

## 2021-05-20 ENCOUNTER — Other Ambulatory Visit: Payer: Self-pay

## 2021-05-20 ENCOUNTER — Ambulatory Visit
Admission: RE | Admit: 2021-05-20 | Discharge: 2021-05-20 | Disposition: A | Discharge status: Home / Self Care | Payer: Medicare Other | Source: Ambulatory Visit | Attending: Internal Medicine | Admitting: Internal Medicine

## 2021-05-20 DIAGNOSIS — N6320 Unspecified lump in the left breast, unspecified quadrant: Secondary | ICD-10-CM | POA: Diagnosis not present

## 2021-05-20 DIAGNOSIS — N63 Unspecified lump in unspecified breast: Secondary | ICD-10-CM

## 2021-05-25 ENCOUNTER — Ambulatory Visit
Admission: RE | Admit: 2021-05-25 | Discharge: 2021-05-25 | Disposition: A | Discharge status: Home / Self Care | Payer: Medicare Other | Source: Ambulatory Visit | Attending: Orthopedic Surgery | Admitting: Orthopedic Surgery

## 2021-05-25 ENCOUNTER — Other Ambulatory Visit: Payer: Self-pay

## 2021-05-25 DIAGNOSIS — M545 Low back pain, unspecified: Secondary | ICD-10-CM

## 2021-05-25 DIAGNOSIS — M4727 Other spondylosis with radiculopathy, lumbosacral region: Secondary | ICD-10-CM | POA: Diagnosis not present

## 2021-05-25 DIAGNOSIS — G8929 Other chronic pain: Secondary | ICD-10-CM

## 2021-05-25 MED ORDER — IOPAMIDOL (ISOVUE-M 200) INJECTION 41%
1.0000 mL | Freq: Once | INTRAMUSCULAR | Status: AC
  Administered 2021-05-25: 1 mL via EPIDURAL

## 2021-05-25 MED ORDER — METHYLPREDNISOLONE ACETATE 40 MG/ML INJ SUSP (RADIOLOG
80.0000 mg | Freq: Once | INTRAMUSCULAR | Status: AC
  Administered 2021-05-25: 80 mg via EPIDURAL

## 2021-05-25 NOTE — Discharge Instructions (Signed)

## 2021-06-08 DIAGNOSIS — M5416 Radiculopathy, lumbar region: Secondary | ICD-10-CM | POA: Diagnosis not present

## 2021-06-11 DIAGNOSIS — M5416 Radiculopathy, lumbar region: Secondary | ICD-10-CM | POA: Insufficient documentation

## 2021-06-16 DIAGNOSIS — M1991 Primary osteoarthritis, unspecified site: Secondary | ICD-10-CM | POA: Diagnosis not present

## 2021-06-16 DIAGNOSIS — R3911 Hesitancy of micturition: Secondary | ICD-10-CM | POA: Diagnosis not present

## 2021-06-16 DIAGNOSIS — Z6825 Body mass index (BMI) 25.0-25.9, adult: Secondary | ICD-10-CM | POA: Diagnosis not present

## 2021-06-16 DIAGNOSIS — M419 Scoliosis, unspecified: Secondary | ICD-10-CM | POA: Diagnosis not present

## 2021-06-16 DIAGNOSIS — M5416 Radiculopathy, lumbar region: Secondary | ICD-10-CM | POA: Diagnosis not present

## 2021-06-16 DIAGNOSIS — N319 Neuromuscular dysfunction of bladder, unspecified: Secondary | ICD-10-CM | POA: Diagnosis not present

## 2021-06-16 DIAGNOSIS — F419 Anxiety disorder, unspecified: Secondary | ICD-10-CM | POA: Diagnosis not present

## 2021-06-16 DIAGNOSIS — Z23 Encounter for immunization: Secondary | ICD-10-CM | POA: Diagnosis not present

## 2021-06-16 DIAGNOSIS — I10 Essential (primary) hypertension: Secondary | ICD-10-CM | POA: Diagnosis not present

## 2021-06-17 DIAGNOSIS — M5416 Radiculopathy, lumbar region: Secondary | ICD-10-CM | POA: Diagnosis not present

## 2021-06-21 ENCOUNTER — Ambulatory Visit (INDEPENDENT_AMBULATORY_CARE_PROVIDER_SITE_OTHER): Payer: Medicare Other | Admitting: Adult Health

## 2021-06-21 ENCOUNTER — Other Ambulatory Visit: Payer: Self-pay

## 2021-06-21 ENCOUNTER — Encounter: Payer: Self-pay | Admitting: Adult Health

## 2021-06-21 VITALS — BP 137/85 | HR 96 | Ht 66.5 in | Wt 156.0 lb

## 2021-06-21 DIAGNOSIS — Z1211 Encounter for screening for malignant neoplasm of colon: Secondary | ICD-10-CM

## 2021-06-21 DIAGNOSIS — Z01419 Encounter for gynecological examination (general) (routine) without abnormal findings: Secondary | ICD-10-CM | POA: Diagnosis not present

## 2021-06-21 LAB — HEMOCCULT GUIAC POC 1CARD (OFFICE): Fecal Occult Blood, POC: NEGATIVE

## 2021-06-21 NOTE — Progress Notes (Signed)
Patient ID: [name], female   DOB: June 29, 1961, 60 y.o.   MRN: [id number] History of Present Illness: [name] is a 60 year old black female, married, PM in for well woman gyn exam. Lab Results  Component Value Date   DIAGPAP  06/18/2020    - Negative for intraepithelial lesion or malignancy (NILM)   HPV NOT DETECTED 12/21/2016   [place] Negative 06/18/2020    PCP is Dr Gerarda Fraction.  Current Medications, Allergies, Past Medical History, Past Surgical History, Family History and Social History were reviewed in [facility] record.     Review of Systems:  Patient denies any headaches, hearing loss, fatigue, blurred vision, shortness of breath, chest pain, abdominal pain, problems with bowel movements, urination, or intercourse. No joint pain or mood swings.  Denies any vaginal bleeding Has had back pain and seeing Dr Rolena Infante in [place], has scoliosis.   Physical Exam:BP 137/85 (BP Location: Left Arm, Patient Position: Sitting, Cuff Size: Normal)   Pulse 96   Ht 5' 6.5" (1.689 m)   Wt 156 lb (70.8 kg)   BMI 24.80 kg/m   General:  Well developed, well nourished, no acute distress Skin:  Warm and dry Neck:  Midline trachea, normal thyroid, good ROM, no lymphadenopathy,no carotid bruits heard. Lungs; Clear to auscultation bilaterally Breast:  No dominant palpable mass, retraction, or nipple discharge Cardiovascular: Regular rate and rhythm Abdomen:  Soft, non tender, no hepatosplenomegaly Pelvic:  External genitalia is normal in appearance, no lesions.  The vagina is pale with loss of rugae. Urethra has no lesions or masses. The cervix is smooth.  Uterus is felt to be normal size, shape, and contour.  No adnexal masses or tenderness noted.Bladder is non tender, no masses felt. Rectal: Good sphincter tone, no polyps, or hemorrhoids felt.  Hemoccult negative. Extremities/musculoskeletal:  No swelling or varicosities noted, no clubbing or cyanosis Psych:  No mood changes,  alert and cooperative,seems happy AA is 0 Fall risk is low  [facility] - 06/21/21 1046       Pregnancy Intention Screening   Does the patient want to become pregnant in the next year? No    Does the patient's partner want to become pregnant in the next year? No    Would the patient like to discuss contraceptive options today? No      Contraception Wrap Up   Current Method Female Sterilization    End Method Female Sterilization    Contraception Counseling Provided No              GAD 7 : Generalized Anxiety Score 06/21/2021 06/18/2020  Nervous, Anxious, on Edge 0 0  Control/stop worrying 0 0  Worry too much - different things 0 0  Trouble relaxing 0 0  Restless 0 0  Easily annoyed or irritable 0 0  Afraid - awful might happen 0 0  Total GAD 7 Score 0 0   . Depression screen [facility] 2/9 06/21/2021 06/18/2020 12/21/2016  Decreased Interest 0 0 0  Down, Depressed, Hopeless 0 0 0  PHQ - 2 Score 0 0 0  Altered sleeping 0 0 -  Tired, decreased energy 0 0 -  Change in appetite 0 0 -  Feeling bad or failure about yourself  0 0 -  Trouble concentrating 0 0 -  Moving slowly or fidgety/restless 0 0 -  Suicidal thoughts 0 0 -  PHQ-9 Score 0 0 -  .Examination chaperoned by Levy Pupa LPN   Impression and Plan: 1. Women's annual  routine gynecological examination Physical in 1 year Pap in 2024 Labs with PCP Colonoscopy per GI Mammogram this month and yearly She did get flu shot  2. Encounter for screening fecal occult blood testing

## 2021-06-24 ENCOUNTER — Other Ambulatory Visit: Payer: Self-pay

## 2021-06-24 ENCOUNTER — Ambulatory Visit (HOSPITAL_COMMUNITY)
Admission: RE | Admit: 2021-06-24 | Discharge: 2021-06-24 | Disposition: A | Discharge status: Home / Self Care | Payer: Medicare Other | Source: Ambulatory Visit | Attending: Internal Medicine | Admitting: Internal Medicine

## 2021-06-24 DIAGNOSIS — Z1231 Encounter for screening mammogram for malignant neoplasm of breast: Secondary | ICD-10-CM | POA: Insufficient documentation

## 2021-06-28 DIAGNOSIS — M5416 Radiculopathy, lumbar region: Secondary | ICD-10-CM | POA: Diagnosis not present

## 2021-07-02 DIAGNOSIS — M5416 Radiculopathy, lumbar region: Secondary | ICD-10-CM | POA: Diagnosis not present

## 2021-07-02 DIAGNOSIS — M545 Low back pain, unspecified: Secondary | ICD-10-CM | POA: Diagnosis not present

## 2021-07-13 DIAGNOSIS — M5416 Radiculopathy, lumbar region: Secondary | ICD-10-CM | POA: Diagnosis not present

## 2021-07-21 DIAGNOSIS — M48061 Spinal stenosis, lumbar region without neurogenic claudication: Secondary | ICD-10-CM | POA: Diagnosis not present

## 2021-07-21 DIAGNOSIS — M5416 Radiculopathy, lumbar region: Secondary | ICD-10-CM | POA: Diagnosis not present

## 2021-07-23 DIAGNOSIS — Z6825 Body mass index (BMI) 25.0-25.9, adult: Secondary | ICD-10-CM | POA: Diagnosis not present

## 2021-07-23 DIAGNOSIS — M419 Scoliosis, unspecified: Secondary | ICD-10-CM | POA: Diagnosis not present

## 2021-07-23 DIAGNOSIS — R3911 Hesitancy of micturition: Secondary | ICD-10-CM | POA: Diagnosis not present

## 2021-07-23 DIAGNOSIS — M5416 Radiculopathy, lumbar region: Secondary | ICD-10-CM | POA: Diagnosis not present

## 2021-07-23 DIAGNOSIS — E663 Overweight: Secondary | ICD-10-CM | POA: Diagnosis not present

## 2021-07-23 DIAGNOSIS — N390 Urinary tract infection, site not specified: Secondary | ICD-10-CM | POA: Diagnosis not present

## 2021-07-23 DIAGNOSIS — N319 Neuromuscular dysfunction of bladder, unspecified: Secondary | ICD-10-CM | POA: Diagnosis not present

## 2021-08-09 DIAGNOSIS — M48061 Spinal stenosis, lumbar region without neurogenic claudication: Secondary | ICD-10-CM | POA: Diagnosis not present

## 2021-08-09 DIAGNOSIS — M5416 Radiculopathy, lumbar region: Secondary | ICD-10-CM | POA: Diagnosis not present

## 2021-09-24 ENCOUNTER — Other Ambulatory Visit (HOSPITAL_COMMUNITY): Payer: Self-pay | Admitting: Neurological Surgery

## 2021-09-24 ENCOUNTER — Other Ambulatory Visit: Payer: Self-pay | Admitting: Neurological Surgery

## 2021-09-24 DIAGNOSIS — M5416 Radiculopathy, lumbar region: Secondary | ICD-10-CM

## 2021-10-06 ENCOUNTER — Other Ambulatory Visit: Payer: Self-pay

## 2021-10-06 ENCOUNTER — Ambulatory Visit (HOSPITAL_COMMUNITY)
Admission: RE | Admit: 2021-10-06 | Discharge: 2021-10-06 | Disposition: A | Discharge status: Home / Self Care | Payer: Medicare HMO | Source: Ambulatory Visit | Attending: Neurological Surgery | Admitting: Neurological Surgery

## 2021-10-06 DIAGNOSIS — M5136 Other intervertebral disc degeneration, lumbar region: Secondary | ICD-10-CM | POA: Diagnosis not present

## 2021-10-06 DIAGNOSIS — M5416 Radiculopathy, lumbar region: Secondary | ICD-10-CM | POA: Insufficient documentation

## 2021-10-06 DIAGNOSIS — M5126 Other intervertebral disc displacement, lumbar region: Secondary | ICD-10-CM | POA: Diagnosis not present

## 2021-11-11 ENCOUNTER — Other Ambulatory Visit: Payer: Self-pay | Admitting: Internal Medicine

## 2021-11-11 ENCOUNTER — Other Ambulatory Visit (HOSPITAL_COMMUNITY): Payer: Self-pay | Admitting: Internal Medicine

## 2021-11-11 DIAGNOSIS — F419 Anxiety disorder, unspecified: Secondary | ICD-10-CM | POA: Diagnosis not present

## 2021-11-11 DIAGNOSIS — Z6824 Body mass index (BMI) 24.0-24.9, adult: Secondary | ICD-10-CM | POA: Diagnosis not present

## 2021-11-11 DIAGNOSIS — E663 Overweight: Secondary | ICD-10-CM | POA: Diagnosis not present

## 2021-11-11 DIAGNOSIS — M1991 Primary osteoarthritis, unspecified site: Secondary | ICD-10-CM | POA: Diagnosis not present

## 2021-11-11 DIAGNOSIS — R1310 Dysphagia, unspecified: Secondary | ICD-10-CM | POA: Diagnosis not present

## 2021-11-11 DIAGNOSIS — G894 Chronic pain syndrome: Secondary | ICD-10-CM | POA: Diagnosis not present

## 2021-11-11 DIAGNOSIS — M5416 Radiculopathy, lumbar region: Secondary | ICD-10-CM | POA: Diagnosis not present

## 2021-11-11 DIAGNOSIS — I10 Essential (primary) hypertension: Secondary | ICD-10-CM | POA: Diagnosis not present

## 2021-11-11 DIAGNOSIS — K219 Gastro-esophageal reflux disease without esophagitis: Secondary | ICD-10-CM | POA: Diagnosis not present

## 2021-11-18 ENCOUNTER — Other Ambulatory Visit: Payer: Self-pay

## 2021-11-18 ENCOUNTER — Ambulatory Visit (HOSPITAL_COMMUNITY)
Admission: RE | Admit: 2021-11-18 | Discharge: 2021-11-18 | Disposition: A | Discharge status: Home / Self Care | Payer: Medicare HMO | Source: Ambulatory Visit | Attending: Internal Medicine | Admitting: Internal Medicine

## 2021-11-18 DIAGNOSIS — R1310 Dysphagia, unspecified: Secondary | ICD-10-CM | POA: Insufficient documentation

## 2021-11-24 ENCOUNTER — Other Ambulatory Visit: Payer: Self-pay

## 2021-11-24 ENCOUNTER — Encounter: Payer: Self-pay | Admitting: Adult Health

## 2021-11-24 ENCOUNTER — Ambulatory Visit: Payer: Medicare HMO | Admitting: Adult Health

## 2021-11-24 VITALS — BP 119/81 | HR 96 | Ht 66.5 in | Wt 154.0 lb

## 2021-11-24 DIAGNOSIS — N950 Postmenopausal bleeding: Secondary | ICD-10-CM | POA: Diagnosis not present

## 2021-11-24 NOTE — Progress Notes (Signed)
?  Subjective:  ?  ? Patient ID: [name], female   DOB: [birth year], 61 y.o.   MRN: [id number] ? ?HPI ?[name] is a 61 year old black female, married, PM in complaining of vaginal bleeding  for 2 days, 11/21/21 and 11/22/21, no pain. ?PCP is Dr Gerarda Fraction. ? ?Review of Systems ?Had red vaginal bleeding 3/19, and 11/22/21 no pain ?Has tingling left foot and left leg, had back surgery last year, will follow up with surgeon,tried gabapentin without relief  ?Reviewed past medical,surgical, social and family history. Reviewed medications and allergies.  ?   ?Objective:  ? Physical Exam ?BP 119/81 (BP Location: Right Arm, Patient Position: Sitting, Cuff Size: Normal)   Pulse 96   Ht 5' 6.5" (1.689 m)   Wt 154 lb (69.9 kg)   BMI 24.48 kg/m?   ?  Skin warm and dry.Pelvic: external genitalia is normal in appearance no lesions, vagina: pale with loss of rugae,urethra has no lesions or masses noted, cervix:smooth, uterus: normal size, shape and contour, non tender, no masses felt, adnexa: no masses or tenderness noted. Bladder is non tender and no masses felt.  ?Examination chaperoned by Balinda Quails NP student ? ?Assessment:  ?   ?1. PMB (postmenopausal bleeding) ?Will get [facility] at [facility] to assess uterine lining 3/3/0/23 at 7:30 am  ?  Discussed could be polyp, or endometrial thickening and if endometrium thickened will need endometrial biopsy  ? ?Plan:  ?   ?Follow up with 12/03/21 to discuss [place] ?   ?

## 2021-11-29 ENCOUNTER — Other Ambulatory Visit (HOSPITAL_COMMUNITY): Payer: Self-pay | Admitting: Internal Medicine

## 2021-11-29 DIAGNOSIS — M5416 Radiculopathy, lumbar region: Secondary | ICD-10-CM

## 2021-12-02 ENCOUNTER — Ambulatory Visit (HOSPITAL_COMMUNITY): Admission: RE | Admit: 2021-12-02 | Payer: Medicare HMO | Source: Ambulatory Visit

## 2021-12-03 ENCOUNTER — Ambulatory Visit: Payer: Medicare HMO | Admitting: Adult Health

## 2021-12-09 ENCOUNTER — Encounter: Payer: Self-pay | Admitting: Internal Medicine

## 2021-12-17 ENCOUNTER — Encounter (HOSPITAL_COMMUNITY): Payer: Self-pay

## 2021-12-17 ENCOUNTER — Ambulatory Visit (HOSPITAL_COMMUNITY): Admission: RE | Admit: 2021-12-17 | Payer: Medicare HMO | Source: Ambulatory Visit

## 2021-12-17 DIAGNOSIS — M5416 Radiculopathy, lumbar region: Secondary | ICD-10-CM | POA: Diagnosis not present

## 2021-12-17 DIAGNOSIS — N950 Postmenopausal bleeding: Secondary | ICD-10-CM | POA: Diagnosis not present

## 2021-12-17 DIAGNOSIS — N319 Neuromuscular dysfunction of bladder, unspecified: Secondary | ICD-10-CM | POA: Diagnosis not present

## 2021-12-17 DIAGNOSIS — I10 Essential (primary) hypertension: Secondary | ICD-10-CM | POA: Diagnosis not present

## 2021-12-17 DIAGNOSIS — F419 Anxiety disorder, unspecified: Secondary | ICD-10-CM | POA: Diagnosis not present

## 2021-12-17 DIAGNOSIS — M419 Scoliosis, unspecified: Secondary | ICD-10-CM | POA: Diagnosis not present

## 2021-12-17 DIAGNOSIS — Z6824 Body mass index (BMI) 24.0-24.9, adult: Secondary | ICD-10-CM | POA: Diagnosis not present

## 2021-12-17 DIAGNOSIS — R413 Other amnesia: Secondary | ICD-10-CM | POA: Diagnosis not present

## 2021-12-27 DIAGNOSIS — M2569 Stiffness of other specified joint, not elsewhere classified: Secondary | ICD-10-CM | POA: Diagnosis not present

## 2021-12-27 DIAGNOSIS — M5417 Radiculopathy, lumbosacral region: Secondary | ICD-10-CM | POA: Diagnosis not present

## 2021-12-27 DIAGNOSIS — Z4789 Encounter for other orthopedic aftercare: Secondary | ICD-10-CM | POA: Diagnosis not present

## 2021-12-27 DIAGNOSIS — M545 Low back pain, unspecified: Secondary | ICD-10-CM | POA: Diagnosis not present

## 2021-12-27 DIAGNOSIS — Z9181 History of falling: Secondary | ICD-10-CM | POA: Diagnosis not present

## 2021-12-29 DIAGNOSIS — M2569 Stiffness of other specified joint, not elsewhere classified: Secondary | ICD-10-CM | POA: Diagnosis not present

## 2021-12-29 DIAGNOSIS — Z4789 Encounter for other orthopedic aftercare: Secondary | ICD-10-CM | POA: Diagnosis not present

## 2021-12-29 DIAGNOSIS — Z9181 History of falling: Secondary | ICD-10-CM | POA: Diagnosis not present

## 2021-12-29 DIAGNOSIS — M545 Low back pain, unspecified: Secondary | ICD-10-CM | POA: Diagnosis not present

## 2021-12-29 DIAGNOSIS — M5417 Radiculopathy, lumbosacral region: Secondary | ICD-10-CM | POA: Diagnosis not present

## 2022-01-05 DIAGNOSIS — M5417 Radiculopathy, lumbosacral region: Secondary | ICD-10-CM | POA: Diagnosis not present

## 2022-01-05 DIAGNOSIS — M545 Low back pain, unspecified: Secondary | ICD-10-CM | POA: Diagnosis not present

## 2022-01-05 DIAGNOSIS — Z9181 History of falling: Secondary | ICD-10-CM | POA: Diagnosis not present

## 2022-01-05 DIAGNOSIS — Z4789 Encounter for other orthopedic aftercare: Secondary | ICD-10-CM | POA: Diagnosis not present

## 2022-01-05 DIAGNOSIS — M2569 Stiffness of other specified joint, not elsewhere classified: Secondary | ICD-10-CM | POA: Diagnosis not present

## 2022-01-07 DIAGNOSIS — Z9181 History of falling: Secondary | ICD-10-CM | POA: Diagnosis not present

## 2022-01-07 DIAGNOSIS — Z4789 Encounter for other orthopedic aftercare: Secondary | ICD-10-CM | POA: Diagnosis not present

## 2022-01-07 DIAGNOSIS — M545 Low back pain, unspecified: Secondary | ICD-10-CM | POA: Diagnosis not present

## 2022-01-07 DIAGNOSIS — M5417 Radiculopathy, lumbosacral region: Secondary | ICD-10-CM | POA: Diagnosis not present

## 2022-01-07 DIAGNOSIS — M2569 Stiffness of other specified joint, not elsewhere classified: Secondary | ICD-10-CM | POA: Diagnosis not present

## 2022-01-10 ENCOUNTER — Ambulatory Visit (HOSPITAL_COMMUNITY)
Admission: RE | Admit: 2022-01-10 | Discharge: 2022-01-10 | Disposition: A | Discharge status: Home / Self Care | Payer: Medicare HMO | Source: Ambulatory Visit | Attending: Adult Health | Admitting: Adult Health

## 2022-01-10 DIAGNOSIS — N950 Postmenopausal bleeding: Secondary | ICD-10-CM | POA: Insufficient documentation

## 2022-01-10 DIAGNOSIS — D259 Leiomyoma of uterus, unspecified: Secondary | ICD-10-CM | POA: Diagnosis not present

## 2022-01-10 DIAGNOSIS — Z853 Personal history of malignant neoplasm of breast: Secondary | ICD-10-CM | POA: Diagnosis not present

## 2022-01-10 DIAGNOSIS — N888 Other specified noninflammatory disorders of cervix uteri: Secondary | ICD-10-CM | POA: Diagnosis not present

## 2022-01-11 DIAGNOSIS — Z9181 History of falling: Secondary | ICD-10-CM | POA: Diagnosis not present

## 2022-01-11 DIAGNOSIS — M2569 Stiffness of other specified joint, not elsewhere classified: Secondary | ICD-10-CM | POA: Diagnosis not present

## 2022-01-11 DIAGNOSIS — Z4789 Encounter for other orthopedic aftercare: Secondary | ICD-10-CM | POA: Diagnosis not present

## 2022-01-11 DIAGNOSIS — M545 Low back pain, unspecified: Secondary | ICD-10-CM | POA: Diagnosis not present

## 2022-01-11 DIAGNOSIS — M5417 Radiculopathy, lumbosacral region: Secondary | ICD-10-CM | POA: Diagnosis not present

## 2022-01-12 ENCOUNTER — Telehealth: Payer: Self-pay | Admitting: Adult Health

## 2022-01-12 DIAGNOSIS — M2569 Stiffness of other specified joint, not elsewhere classified: Secondary | ICD-10-CM | POA: Diagnosis not present

## 2022-01-12 DIAGNOSIS — Z4789 Encounter for other orthopedic aftercare: Secondary | ICD-10-CM | POA: Diagnosis not present

## 2022-01-12 DIAGNOSIS — M5417 Radiculopathy, lumbosacral region: Secondary | ICD-10-CM | POA: Diagnosis not present

## 2022-01-12 DIAGNOSIS — Z9181 History of falling: Secondary | ICD-10-CM | POA: Diagnosis not present

## 2022-01-12 DIAGNOSIS — M545 Low back pain, unspecified: Secondary | ICD-10-CM | POA: Diagnosis not present

## 2022-01-12 NOTE — Telephone Encounter (Signed)
Pt aware that could not see endometrium due to fibroids location, will get appt for endometrial biopsy  ?

## 2022-01-13 DIAGNOSIS — R41841 Cognitive communication deficit: Secondary | ICD-10-CM | POA: Diagnosis not present

## 2022-01-17 DIAGNOSIS — M545 Low back pain, unspecified: Secondary | ICD-10-CM | POA: Diagnosis not present

## 2022-01-17 DIAGNOSIS — M5416 Radiculopathy, lumbar region: Secondary | ICD-10-CM | POA: Diagnosis not present

## 2022-01-19 ENCOUNTER — Ambulatory Visit: Payer: Medicare HMO | Admitting: Obstetrics & Gynecology

## 2022-01-19 ENCOUNTER — Encounter: Payer: Self-pay | Admitting: Obstetrics & Gynecology

## 2022-01-19 ENCOUNTER — Other Ambulatory Visit (HOSPITAL_COMMUNITY)
Admission: RE | Admit: 2022-01-19 | Discharge: 2022-01-19 | Disposition: A | Discharge status: Home / Self Care | Payer: Medicare HMO | Source: Ambulatory Visit | Attending: Obstetrics & Gynecology | Admitting: Obstetrics & Gynecology

## 2022-01-19 VITALS — BP 156/90 | HR 96 | Ht 67.0 in | Wt 151.2 lb

## 2022-01-19 DIAGNOSIS — N882 Stricture and stenosis of cervix uteri: Secondary | ICD-10-CM | POA: Diagnosis not present

## 2022-01-19 DIAGNOSIS — N950 Postmenopausal bleeding: Secondary | ICD-10-CM | POA: Diagnosis not present

## 2022-01-19 DIAGNOSIS — D250 Submucous leiomyoma of uterus: Secondary | ICD-10-CM | POA: Diagnosis not present

## 2022-01-19 DIAGNOSIS — N719 Inflammatory disease of uterus, unspecified: Secondary | ICD-10-CM | POA: Diagnosis not present

## 2022-01-19 NOTE — Progress Notes (Signed)
? ?GYN VISIT ?Patient name: [name] MRN [id number]  Date of birth: 1961-05-23 ?Chief Complaint:   ?endometrial biopsy ? ?History of Present Illness:   ?[name] is a 61 y.o. G4P2 PM female being seen today for follow up regarding: ? ?Postmenopausal bleeding: Noted 2 days of bleeding in March- bright red and stopped after that.  Wore a small pad, using about 2-3 pads per day- did not soak the pad.  No bleeding since.  Denies dysmenorrhea ? ?Denies irregular discharge, itching or irritation.  No other acute complaints ? ?No LMP recorded. Patient has had an ablation. ? ? ?  06/21/2021  ? 10:46 AM 06/18/2020  ? 10:07 AM 12/21/2016  ?  3:00 PM  ?Depression screen PHQ 2/9  ?Decreased Interest 0 0 0  ?Down, Depressed, Hopeless 0 0 0  ?PHQ - 2 Score 0 0 0  ?Altered sleeping 0 0   ?Tired, decreased energy 0 0   ?Change in appetite 0 0   ?Feeling bad or failure about yourself  0 0   ?Trouble concentrating 0 0   ?Moving slowly or fidgety/restless 0 0   ?Suicidal thoughts 0 0   ?PHQ-9 Score 0 0   ? ? ? ?Review of Systems:   ?Pertinent items are noted in HPI ?Denies fever/chills, dizziness, headaches, visual disturbances, fatigue, shortness of breath, chest pain, abdominal pain, vomiting, denies problems with bowel movements, urination, or intercourse unless otherwise stated above.  ?Pertinent History Reviewed:  ?Reviewed past medical,surgical, social, obstetrical and family history.  ?Reviewed problem list, medications and allergies. ?Physical Assessment:  ? ?Vitals:  ? 01/19/22 1118  ?BP: (!) 156/90  ?Pulse: 96  ?Weight: 151 lb 3.2 oz (68.6 kg)  ?Height: '5\' 7"'$  (1.702 m)  ?Body mass index is 23.68 kg/m?. ? ?     Physical Examination:  ? General appearance: alert, well appearing, and in no distress ? Psych: mood appropriate, normal affect ? Skin: warm & dry  ? Cardiovascular: normal heart rate noted ? Respiratory: normal respiratory effort, no distress ? Abdomen: soft, non-tender  ? Pelvic: VULVA: normal appearing vulva with no  masses, tenderness or lesions, VAGINA: normal appearing vagina with normal color and discharge, no lesions, CERVIX: normal appearing cervix without discharge or lesions, UTERUS: uterus is normal size, shape, consistency and nontender ? Extremities: no edema  ? ?Chaperone: Levy Pupa   ? ?Endometrial Biopsy Procedure Note ? ?Pre-operative Diagnosis: Postmenopausal bleeding ? ?Post-operative Diagnosis: same ? ?Procedure Details  ?The risks (including infection, bleeding, pain, and uterine perforation) and benefits of the procedure were explained to the patient and Written informed consent was obtained.  Antibiotic prophylaxis against endocarditis was not indicated. ? ? The patient was placed in the dorsal lithotomy position.  Bimanual exam showed the uterus to be in the neutral position.  A speculum inserted in the vagina, and the cervix prepped with betadine.   ? ? A single tooth tenaculum was applied to the anterior lip of the cervix for stabilization.  Os finder was used due to cervical stenosis.  A Pipelle endometrial aspirator was used to sample the endometrium.  Minimal tissue obtained. Sample was sent for pathologic examination. ? ?Condition: ?Stable ? ?Complications: ?None ? ? ?Assessment & Plan:  ?1) Postmenopausal bleeding, Submucosal fibroid ?Next step pending results of pathology.  Reviewed that specimen may be inadequate.  Will plan to monitor bleeding- should she note bleeding in the future would move forward with [facility], [facility] ?The patient was advised to call for any fever  or for prolonged or severe pain or bleeding. She was advised to use OTC analgesics as needed for mild to moderate pain. She was advised to avoid vaginal intercourse for 48 hours or until the bleeding has completely stopped. ? -Questions and concerns were addressed ? ? ?-f/u for annual or sooner if bleeding returns ? ?Janyth Pupa, DO ?Attending [facility] ?[facility] ? ? ? ?

## 2022-01-20 LAB — SURGICAL PATHOLOGY

## 2022-01-21 DIAGNOSIS — R41841 Cognitive communication deficit: Secondary | ICD-10-CM | POA: Diagnosis not present

## 2022-01-27 ENCOUNTER — Ambulatory Visit
Admission: EM | Admit: 2022-01-27 | Discharge: 2022-01-27 | Disposition: A | Discharge status: Home / Self Care | Payer: Medicare HMO | Attending: Nurse Practitioner | Admitting: Nurse Practitioner

## 2022-01-27 DIAGNOSIS — N3000 Acute cystitis without hematuria: Secondary | ICD-10-CM | POA: Insufficient documentation

## 2022-01-27 LAB — POCT URINALYSIS DIP (MANUAL ENTRY)
Bilirubin, UA: NEGATIVE
Glucose, UA: NEGATIVE mg/dL
Ketones, POC UA: NEGATIVE mg/dL
Nitrite, UA: POSITIVE — AB
Protein Ur, POC: >=300 mg/dL — AB
Spec Grav, UA: 1.02 (ref 1.010–1.025)
Urobilinogen, UA: 0.2 E.U./dL
pH, UA: 5.5 (ref 5.0–8.0)

## 2022-01-27 MED ORDER — AMOXICILLIN-POT CLAVULANATE 875-125 MG PO TABS: 1.0000 | ORAL_TABLET | Freq: Two times a day (BID) | ORAL | 0 refills | Status: DC

## 2022-01-27 NOTE — ED Provider Notes (Signed)
[facility]    CSN: [id number] Arrival date & time: 01/27/22  0919      History   Chief Complaint Chief Complaint  Patient presents with   UTI symptoms    HPI [name] is a 61 y.o. female.   The patient is a 61 year old female who presents with dysuria, urinary frequency and urgency.  Symptoms have been present for the past 3 days.  Patient states she was started on new medications to include pregabalin and bethanechol.  Since that time, patient has had urinary symptoms.  She denies fever, chills, abdominal pain, flank pain or hematuria.  She states her most recent urinary tract infection was several years ago.  She has not taken any medication for symptoms.    The history is provided by the patient.   Past Medical History:  Diagnosis Date   Anemia    Arthritis    Breast cancer ([place])    right breast cancer 5 yrs ago   Cancer [facility])     right breast cancer   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   APPENDIX IN [facility]   Depression    Dysrhythmia    hx palpitations-too much caffiene   Headache    Scoliosis     Patient Active Problem List   Diagnosis Date Noted   Women's annual routine gynecological examination 06/21/2021   Mass of upper outer quadrant of left breast 04/13/2021   Encounter for screening fecal occult blood testing 06/18/2020   Encounter for gynecological examination with Papanicolaou smear of cervix 06/18/2020   NSAID induced gastritis    Anemia 12/20/2019   Dyspepsia 10/23/2019   Simple bone cyst 11/23/2018   Constipation 04/16/2018   Encounter for screening colonoscopy 04/16/2018   Cancer of breast, intraductal, right 12/21/2016   Pap smear, as part of routine gynecological examination 12/21/2016   Anxiety 12/09/2015   Muscular aches 01/05/2015   Reactive depression 01/05/2015   Perimenopausal and postmenopausal vasomotor symptoms 10/09/2014   Hot flashes due to tamoxifen 07/28/2014   Memory loss 07/28/2014   Vision changes  07/28/2014   Routine gynecological examination 11/21/2013   Cancer of upper-inner quadrant of female breast ([place]) 10/11/2013   Menorrhagia with irregular cycle 07/10/2012    Past Surgical History:  Procedure Laterality Date   ANKLE SURGERY     left ankle cyst   BACK SURGERY  2000   lumbar disc surgery   BIOPSY  01/07/2020   Procedure: BIOPSY;  Surgeon: Danie Binder, MD;  [facility];  Service: Endoscopy;;   BREAST EXCISIONAL BIOPSY Left    benign   BREAST SURGERY     left partail mastectomy-cyst   CERVICAL DISC SURGERY  1999   anterior and a posterier cerv fusionx2   CESAREAN SECTION     x2   COLONOSCOPY     COLONOSCOPY WITH PROPOFOL N/A 06/26/2018   Procedure: COLONOSCOPY WITH PROPOFOL;  Surgeon: Danie Binder, MD;  [facility]: [facility];  Service: Endoscopy;  Laterality: N/A;  11:00am   DILATION AND CURETTAGE OF UTERUS     DILITATION & CURRETTAGE/HYSTROSCOPY WITH THERMACHOICE ABLATION  07/10/2012   Procedure: DILATATION & CURETTAGE/HYSTEROSCOPY WITH THERMACHOICE ABLATION;  Surgeon: [name], MD;  Location: [facility];  Service: Gynecology;  Laterality: N/A;  D5 36m in, 13 ml out, temp 87degree celcius, total therapy time 944m 16sec   ERCP     ESOPHAGOGASTRODUODENOSCOPY (EGD) WITH PROPOFOL N/A 01/07/2020   Procedure: ESOPHAGOGASTRODUODENOSCOPY (EGD) WITH PROPOFOL;  Surgeon: FiOneida Alar  Marga Melnick, MD;  Location: [facility];  Service: Endoscopy;  Laterality: N/A;  2:00pm   POLYPECTOMY  06/26/2018   Procedure: POLYPECTOMY;  Surgeon: Danie Binder, MD;  Location: [facility];  Service: Endoscopy;;  colon   TUBAL LIGATION     with last c-section    OB History     Gravida  4   Para  2   Term      Preterm      AB      Living  2      SAB      IAB      Ectopic      Multiple      Live Births  2            Home Medications    Prior to Admission medications   Medication Sig Start Date End Date Taking? Authorizing Provider   amoxicillin-clavulanate (AUGMENTIN) 875-125 MG tablet Take 1 tablet by mouth every 12 (twelve) hours. 01/27/22  Yes Shamari Lofquist-Warren, Alda Lea, NP  ALPRAZolam Duanne Moron) 0.5 MG tablet Take 0.5 mg by mouth as needed for anxiety.     [provider]  calcium carbonate (OSCAL) 1500 (600 Ca) MG TABS tablet Take 600 mg of elemental calcium by mouth daily with breakfast.     [provider]  Cholecalciferol (VITAMIN D3) 10 MCG (400 UNIT) CAPS Take 400 Units by mouth daily.     [provider]  cyclobenzaprine (FLEXERIL) 10 MG tablet Take 10 mg by mouth daily as needed for muscle spasms.     [provider]  Ferrous Sulfate (IRON PO) Take by mouth.    [provider]  HYDROcodone-acetaminophen (NORCO) 10-325 MG tablet Take 1 tablet by mouth every 6 (six) hours as needed for moderate pain.     [provider]  IBU 800 MG tablet Take 800 mg by mouth every 8 (eight) hours as needed for moderate pain. 02/01/18   [provider]  Multiple Vitamins-Minerals (MULTIVITAMIN WOMEN 50+ PO) Take 1 tablet by mouth daily.    [provider]  pregabalin (LYRICA) 75 MG capsule Take 75 mg by mouth 2 (two) times daily.    [provider]  venlafaxine XR (EFFEXOR-XR) 150 MG 24 hr capsule     [provider]  vitamin B-12 (CYANOCOBALAMIN) 500 MCG tablet Take 500 mcg by mouth.    [provider]    Family History Family History  Problem Relation Age of Onset   Hypertension Mother    Colon cancer Neg Hx    Gastric cancer Neg Hx    Esophageal cancer Neg Hx     Social History Social History   Tobacco Use   Smoking status: Never   Smokeless tobacco: Never  Vaping Use   Vaping Use: Never used  Substance Use Topics   Alcohol use: No   Drug use: No     Allergies   Patient has no known allergies.   Review of Systems Review of Systems PER HPI  Physical Exam Triage Vital Signs ED Triage Vitals  Enc Vitals Group      BP 01/27/22 1004 106/71     Pulse Rate 01/27/22 1004 99     Resp 01/27/22 1004 18     Temp 01/27/22 1004 98 F (36.7 C)     Temp Source 01/27/22 1004 Oral     SpO2 01/27/22 1004 97 %     Weight --      Height --  Head Circumference --      Peak Flow --      Pain Score 01/27/22 1008 5     Pain Loc --      Pain Edu? --      Excl. in [place]? --    No data found.  Updated Vital Signs BP 106/71 (BP Location: Right Arm)   Pulse 99   Temp 98 F (36.7 C) (Oral)   Resp 18   SpO2 97%   Visual Acuity Right Eye Distance:   Left Eye Distance:   Bilateral Distance:    Right Eye Near:   Left Eye Near:    Bilateral Near:     Physical Exam Vitals and nursing note reviewed.  Constitutional:      Appearance: Normal appearance.  HENT:     Head: Normocephalic.  Cardiovascular:     Rate and Rhythm: Normal rate and regular rhythm.     Pulses: Normal pulses.     Heart sounds: Normal heart sounds.  Pulmonary:     Effort: Pulmonary effort is normal. No respiratory distress.     Breath sounds: Normal breath sounds. No wheezing or rales.  Abdominal:     General: Bowel sounds are normal.     Palpations: Abdomen is soft.     Tenderness: There is no right CVA tenderness or left CVA tenderness.  Skin:    Capillary Refill: Capillary refill takes less than 2 seconds.  Neurological:     General: No focal deficit present.     Mental Status: She is alert and oriented to person, place, and time.  Psychiatric:        Mood and Affect: Mood normal.        Behavior: Behavior normal.     UC Treatments / Results  Labs (all labs ordered are listed, but only abnormal results are displayed) Labs Reviewed  POCT URINALYSIS DIP (MANUAL ENTRY) - Abnormal; Notable for the following components:      Result Value   Clarity, UA cloudy (*)    Blood, UA large (*)    Protein Ur, POC >=300 (*)    Nitrite, UA Positive (*)    Leukocytes, UA Large (3+) (*)    All other components within normal limits   URINE CULTURE    EKG   Radiology No results found.  Procedures Procedures (including critical care time)  Medications Ordered in UC Medications - No data to display  Initial Impression / Assessment and Plan / UC Course  I have reviewed the triage vital signs and the nursing notes.  Pertinent labs & imaging results that were available during my care of the patient were reviewed by me and considered in my medical decision making (see chart for details).  Patient's vital signs are stable her exam is reassuring.  Her urinalysis does show positive nitrates and leukocytes, which are consistent with a urinary tract infection.  Urine culture is pending to ensure patient is being treated with the appropriate antibiotic.  Supportive care was recommended along with use of over-the-counter medications as needed for pain.  Strict return precautions were provided.  Follow-up as needed. Final Clinical Impressions(s) / UC Diagnoses   Final diagnoses:  Acute cystitis without hematuria     Discharge Instructions      Take medication as prescribed. Your urinalysis does show you have a urinary tract infection.  Urine culture has been ordered to ensure you are being treated with the appropriate medication.  If the medication needs to be  changed, you will be contacted. Increase fluids.  You should be drinking at least 6-8 8 ounce glasses of water daily while symptoms persist. Try to void every 2 hours while symptoms persist. If you are sexually active, you should be voiding at least 15 to 20 minutes after sexual intercourse. May take ibuprofen or Tylenol for pain, fever, general discomfort. If you develop fever, chills, abdominal pain, or severe low back pain, or if your symptoms do not improve, follow-up in our clinic or with your primary care physician.     ED Prescriptions     Medication Sig Dispense Auth. Provider   amoxicillin-clavulanate (AUGMENTIN) 875-125 MG tablet Take 1 tablet by  mouth every 12 (twelve) hours. 14 tablet Belkis Norbeck-Warren, Alda Lea, NP      PDMP not reviewed this encounter.   Tish Men, NP 01/27/22 1026

## 2022-01-27 NOTE — Discharge Instructions (Addendum)
Take medication as prescribed. Your urinalysis does show you have a urinary tract infection.  Urine culture has been ordered to ensure you are being treated with the appropriate medication.  If the medication needs to be changed, you will be contacted. Increase fluids.  You should be drinking at least 6-8 8 ounce glasses of water daily while symptoms persist. Try to void every 2 hours while symptoms persist. If you are sexually active, you should be voiding at least 15 to 20 minutes after sexual intercourse. May take ibuprofen or Tylenol for pain, fever, general discomfort. If you develop fever, chills, abdominal pain, or severe low back pain, or if your symptoms do not improve, follow-up in our clinic or with your primary care physician.

## 2022-01-27 NOTE — ED Triage Notes (Signed)
Pt states she has been taking Pregabalin '75mg'$  5/15 for back pain and Bethanechol '5mg'$  since 05/24 from her PCP and she thinks it gave her a UTI  Pt states she has pain when urinating and frequency   Denies Fever

## 2022-01-28 LAB — URINE CULTURE

## 2022-01-29 ENCOUNTER — Encounter (HOSPITAL_COMMUNITY): Payer: Self-pay

## 2022-01-29 ENCOUNTER — Emergency Department (HOSPITAL_COMMUNITY): Payer: Medicare HMO

## 2022-01-29 ENCOUNTER — Other Ambulatory Visit: Payer: Self-pay

## 2022-01-29 ENCOUNTER — Emergency Department (HOSPITAL_COMMUNITY)
Admission: EM | Admit: 2022-01-29 | Discharge: 2022-01-29 | Disposition: A | Discharge status: Home / Self Care | Payer: Medicare HMO | Attending: Emergency Medicine | Admitting: Emergency Medicine

## 2022-01-29 DIAGNOSIS — K209 Esophagitis, unspecified without bleeding: Secondary | ICD-10-CM | POA: Diagnosis not present

## 2022-01-29 DIAGNOSIS — N390 Urinary tract infection, site not specified: Secondary | ICD-10-CM | POA: Insufficient documentation

## 2022-01-29 DIAGNOSIS — K208 Other esophagitis without bleeding: Secondary | ICD-10-CM | POA: Insufficient documentation

## 2022-01-29 DIAGNOSIS — K219 Gastro-esophageal reflux disease without esophagitis: Secondary | ICD-10-CM | POA: Diagnosis not present

## 2022-01-29 DIAGNOSIS — Z853 Personal history of malignant neoplasm of breast: Secondary | ICD-10-CM | POA: Insufficient documentation

## 2022-01-29 DIAGNOSIS — R52 Pain, unspecified: Secondary | ICD-10-CM | POA: Diagnosis not present

## 2022-01-29 DIAGNOSIS — K30 Functional dyspepsia: Secondary | ICD-10-CM | POA: Diagnosis present

## 2022-01-29 DIAGNOSIS — T50905A Adverse effect of unspecified drugs, medicaments and biological substances, initial encounter: Secondary | ICD-10-CM

## 2022-01-29 DIAGNOSIS — R5381 Other malaise: Secondary | ICD-10-CM | POA: Diagnosis not present

## 2022-01-29 DIAGNOSIS — J690 Pneumonitis due to inhalation of food and vomit: Secondary | ICD-10-CM | POA: Diagnosis not present

## 2022-01-29 DIAGNOSIS — F419 Anxiety disorder, unspecified: Secondary | ICD-10-CM | POA: Diagnosis not present

## 2022-01-29 DIAGNOSIS — R531 Weakness: Secondary | ICD-10-CM | POA: Diagnosis not present

## 2022-01-29 LAB — URINALYSIS, ROUTINE W REFLEX MICROSCOPIC
Bilirubin Urine: NEGATIVE
Glucose, UA: NEGATIVE mg/dL
Ketones, ur: NEGATIVE mg/dL
Leukocytes,Ua: NEGATIVE
Nitrite: NEGATIVE
Protein, ur: NEGATIVE mg/dL
Specific Gravity, Urine: 1 — ABNORMAL LOW (ref 1.005–1.030)
pH: 8 (ref 5.0–8.0)

## 2022-01-29 MED ORDER — ALUM & MAG HYDROXIDE-SIMETH 200-200-20 MG/5ML PO SUSP
30.0000 mL | Freq: Once | ORAL | Status: AC
  Administered 2022-01-29: 30 mL via ORAL
  Filled 2022-01-29: qty 30

## 2022-01-29 NOTE — Discharge Instructions (Signed)
Continue taking the Augmentin.  We obtained a urine culture and if you grow bacteria that is not covered by this antibiotic you will receive a call from the hospital to change the medicine.  Follow-up with your primary in the next 2 days for reevaluation.  Follow-up with GI if you continue to have pain with swallowing or it feels difficult swallowing pills or food.  Return to ED for new or concerning symptoms.

## 2022-01-29 NOTE — ED Triage Notes (Signed)
Patient brought in by [facility] for pill being stuck in throat initially.  EMS states that the patient was taking an antibiotic which was a larger pill and felt like it was stuck in her throat.  Patient says she now feels the pill is stuck in her stomach.  Patient is breathing and able to swallow at triage.

## 2022-01-29 NOTE — ED Provider Notes (Signed)
This patient is a 61 year old female presenting with a possible pill esophagitis after swallowing Augmentin.  She has been taking this for 2 days due to possible urinary tract infection.  Culture came back with multiple species.  She states that when she tried to take it this morning she felt like it got caught in her upper esophagus when she swallowed it.  This was very disconcerting for her, her husband tried to give her the Heimlich maneuver but nothing came up, she eventually tried to swallow it down with some ginger ale and feels like it progressed down towards her stomach and now has an epigastric discomfort.  She has not tried to swallow anything else since that happened.  She felt like her throat was closing off, she no longer feels that but feels a difficulty with the discomfort in that upper esophagus.  Ultimately she is not vomiting, she is not having diarrhea, she has been not having any difficulty breathing though she is trying to cough to deal with some of the secretions.  On exam the patient has a clear oropharynx without any evidence of obstruction or food impaction, phonation is normal, lungs are clear, heart is without tachycardia.  Epigastric is not tender to palpation.  X-ray, p.o. trial, may have mild pill esophagitis, rule out obstructive disease with p.o. trial  Medical screening examination/treatment/procedure(s) were conducted as a shared visit with non-physician practitioner(s) and myself.  I personally evaluated the patient during the encounter.  Clinical Impression:   Final diagnoses:  None         Noemi Chapel, MD 01/29/22 1911

## 2022-01-29 NOTE — ED Notes (Signed)
Patient given water and able to swallow without any trouble.  Assisted patient out of the bed.  Patient ambulated to the bathroom.

## 2022-01-29 NOTE — ED Provider Notes (Signed)
[facility] Provider Note   CSN: [id number] Arrival date & time: 01/29/22  1102     History  Chief Complaint  Patient presents with   Pill Stuck in [place] is a 61 y.o. female.  HPI  Patient with medical history notable for history of breast cancer, dyspepsia and NSAID induced gastritis, anxiety, recent UTI diagnosis on Augmentin presents today due to aspiration.  Patient was taking the Augmentin pill but feels like it got stuck in her throat.  She tried to make herself vomit with a spoon but was unsuccessful.  She feels the pill is now in her epigastric area, she is handling secretions and is able to swallow.  She states she is not on any antacid for GERD, no h/o esophageal stricture or dilation.  Denies any chest pain, not short of breath or wheezing.  Seen in urgent care 3 days ago, multi species notable on urine culture and UA was notable for nitrites and leukocytes.  Started on Augmentin.  Home Medications Prior to Admission medications   Medication Sig Start Date End Date Taking? Authorizing Provider  ALPRAZolam Duanne Moron) 0.5 MG tablet Take 0.5 mg by mouth as needed for anxiety.     [provider]  amoxicillin-clavulanate (AUGMENTIN) 875-125 MG tablet Take 1 tablet by mouth every 12 (twelve) hours. 01/27/22   Leath-Warren, Alda Lea, NP  calcium carbonate (OSCAL) 1500 (600 Ca) MG TABS tablet Take 600 mg of elemental calcium by mouth daily with breakfast.     [provider]  Cholecalciferol (VITAMIN D3) 10 MCG (400 UNIT) CAPS Take 400 Units by mouth daily.     [provider]  cyclobenzaprine (FLEXERIL) 10 MG tablet Take 10 mg by mouth daily as needed for muscle spasms.     [provider]  Ferrous Sulfate (IRON PO) Take by mouth.    [provider]  HYDROcodone-acetaminophen (NORCO) 10-325 MG tablet Take 1 tablet by mouth every 6 (six) hours as needed for moderate pain.     [provider]   IBU 800 MG tablet Take 800 mg by mouth every 8 (eight) hours as needed for moderate pain. 02/01/18   [provider]  Multiple Vitamins-Minerals (MULTIVITAMIN WOMEN 50+ PO) Take 1 tablet by mouth daily.    [provider]  pregabalin (LYRICA) 75 MG capsule Take 75 mg by mouth 2 (two) times daily.    [provider]  venlafaxine XR (EFFEXOR-XR) 150 MG 24 hr capsule     [provider]  vitamin B-12 (CYANOCOBALAMIN) 500 MCG tablet Take 500 mcg by mouth.    [provider]      Allergies    Patient has no known allergies.    Review of Systems   Review of Systems  Physical Exam Updated Vital Signs BP 102/63   Pulse 98   Temp 97.9 F (36.6 C) (Oral)   Resp 19   Ht '5\' 7"'$  (1.702 m)   Wt 68 kg   SpO2 97%   BMI 23.49 kg/m  Physical Exam Vitals and nursing note reviewed. Exam conducted with a chaperone present.  Constitutional:      General: She is not in acute distress.    Appearance: Normal appearance.  HENT:     Head: Normocephalic and atraumatic.     Mouth/Throat:     Mouth: Mucous membranes are moist.     Pharynx: Oropharynx is clear.     Comments: Uvula midline, tolerating secretions Eyes:  General: No scleral icterus.    Extraocular Movements: Extraocular movements intact.     Pupils: Pupils are equal, round, and reactive to light.  Cardiovascular:     Rate and Rhythm: Normal rate and regular rhythm.     Pulses: Normal pulses.  Pulmonary:     Breath sounds: Normal breath sounds.     Comments: Lungs are clear to auscultation, no stridor.  Patient is speaking in complete sentences but tachypneic Abdominal:     General: Abdomen is flat.     Palpations: Abdomen is soft.     Tenderness: There is no abdominal tenderness.  Skin:    Coloration: Skin is not jaundiced.  Neurological:     Mental Status: She is alert and oriented to person, place, and time. Mental status is at baseline.     Coordination: Coordination normal.   Psychiatric:     Comments: Anxious    ED Results / Procedures / Treatments   Labs (all labs ordered are listed, but only abnormal results are displayed) Labs Reviewed  URINALYSIS, ROUTINE W REFLEX MICROSCOPIC - Abnormal; Notable for the following components:      Result Value   Color, Urine STRAW (*)    Specific Gravity, Urine 1.000 (*)    Hgb urine dipstick SMALL (*)    Bacteria, UA RARE (*)    All other components within normal limits  URINE CULTURE    EKG None  Radiology DG Chest 2 View  Result Date: 01/29/2022 CLINICAL DATA:  Aspiration EXAM: CHEST - 2 VIEW COMPARISON:  04/12/2018 FINDINGS: Right lumpectomy changes. There is no edema, consolidation, effusion, or pneumothorax. Normal heart size and mediastinal contours. IMPRESSION: Clear lungs. Electronically Signed   By: Jorje Guild M.D.   On: 01/29/2022 11:41    Procedures Procedures    Medications Ordered in ED Medications  alum & mag hydroxide-simeth (MAALOX/MYLANTA) 200-200-20 MG/5ML suspension 30 mL (30 mLs Oral Given 01/29/22 1227)    ED Course/ Medical Decision Making/ A&P                           Medical Decision Making Amount and/or Complexity of Data Reviewed Labs: ordered. Radiology: ordered.  Risk OTC drugs.  Patient presents with sensation of pill being stuck in her throat.  Differential diagnosis includes pill esophagitis, esophageal stricture, airway compromise.   On exam her airway is patent and she is handling secretions without difficulty.  Lungs are clear to auscultation there is no stridor or hypoxia.  I reviewed external records.  Patient was seen be by GI in May 2021 had an upper endoscopy with biopsy performed.  This was due to suspected NSAID gastritis.  Mild gastritis and esophageal web noted. Barney Drain, MD [facility]) UA and urine culture obtained at urgent care 2 days ago reviewed, urine culture appeared contaminated with multiple species present.  I ordered  and viewed the chest x-ray.  Per my interpretation there is no acute pulmonary pathology.  Agree with radiologist interpretation.  I ordered and obtained a UA with urine culture.  We will keep patient on Augmentin for now pending urine culture.  On reevaluation patient is calm, her respiratory rate is improved.  Her husband is at bedside.  She was able to swallow and drink a glass of water.  Will p.o. challenge with solid food and Maalox.  Patient's pain is improved and fully resolved.  She is able to swallow saltine crackers without any difficulty.  Do  not feel any additional work-up is indicated at this time, encourage patient to follow-up with GI if she continues to have symptoms of dysphagia.  Discussed HPI, physical exam and plan of care for this patient with attending Noemi Chapel. The attending physician evaluated this patient as part of a shared visit and agrees with plan of care.         Final Clinical Impression(s) / ED Diagnoses Final diagnoses:  Pill esophagitis    Rx / DC Orders ED Discharge Orders     None         Sherrill Raring, Hershal Coria 01/29/22 1358    Noemi Chapel, MD 01/29/22 1911

## 2022-01-29 NOTE — ED Notes (Signed)
Patient drinking small sips of water and eating saltine crackers without difficulty.

## 2022-01-31 LAB — URINE CULTURE: Culture: NO GROWTH

## 2022-02-07 ENCOUNTER — Ambulatory Visit: Payer: Medicare HMO | Admitting: Gastroenterology

## 2022-02-08 DIAGNOSIS — M5416 Radiculopathy, lumbar region: Secondary | ICD-10-CM | POA: Diagnosis not present

## 2022-02-17 DIAGNOSIS — F419 Anxiety disorder, unspecified: Secondary | ICD-10-CM | POA: Diagnosis not present

## 2022-02-17 DIAGNOSIS — Z6824 Body mass index (BMI) 24.0-24.9, adult: Secondary | ICD-10-CM | POA: Diagnosis not present

## 2022-02-23 DIAGNOSIS — H2513 Age-related nuclear cataract, bilateral: Secondary | ICD-10-CM | POA: Diagnosis not present

## 2022-02-23 DIAGNOSIS — H40013 Open angle with borderline findings, low risk, bilateral: Secondary | ICD-10-CM | POA: Diagnosis not present

## 2022-03-01 DIAGNOSIS — C50211 Malignant neoplasm of upper-inner quadrant of right female breast: Secondary | ICD-10-CM | POA: Diagnosis not present

## 2022-03-01 DIAGNOSIS — E559 Vitamin D deficiency, unspecified: Secondary | ICD-10-CM | POA: Diagnosis not present

## 2022-03-01 DIAGNOSIS — F321 Major depressive disorder, single episode, moderate: Secondary | ICD-10-CM | POA: Diagnosis not present

## 2022-03-01 DIAGNOSIS — M545 Low back pain, unspecified: Secondary | ICD-10-CM | POA: Diagnosis not present

## 2022-03-01 DIAGNOSIS — F320 Major depressive disorder, single episode, mild: Secondary | ICD-10-CM | POA: Diagnosis not present

## 2022-03-01 DIAGNOSIS — E063 Autoimmune thyroiditis: Secondary | ICD-10-CM | POA: Diagnosis not present

## 2022-03-01 DIAGNOSIS — Z6823 Body mass index (BMI) 23.0-23.9, adult: Secondary | ICD-10-CM | POA: Diagnosis not present

## 2022-03-01 DIAGNOSIS — Z0001 Encounter for general adult medical examination with abnormal findings: Secondary | ICD-10-CM | POA: Diagnosis not present

## 2022-03-01 DIAGNOSIS — D518 Other vitamin B12 deficiency anemias: Secondary | ICD-10-CM | POA: Diagnosis not present

## 2022-03-01 DIAGNOSIS — R1310 Dysphagia, unspecified: Secondary | ICD-10-CM | POA: Diagnosis not present

## 2022-03-02 ENCOUNTER — Other Ambulatory Visit (HOSPITAL_COMMUNITY): Payer: Self-pay | Admitting: Internal Medicine

## 2022-03-02 ENCOUNTER — Other Ambulatory Visit: Payer: Self-pay | Admitting: Internal Medicine

## 2022-03-02 DIAGNOSIS — R1310 Dysphagia, unspecified: Secondary | ICD-10-CM

## 2022-03-09 DIAGNOSIS — M5416 Radiculopathy, lumbar region: Secondary | ICD-10-CM | POA: Diagnosis not present

## 2022-03-31 DIAGNOSIS — F419 Anxiety disorder, unspecified: Secondary | ICD-10-CM | POA: Diagnosis not present

## 2022-03-31 DIAGNOSIS — F320 Major depressive disorder, single episode, mild: Secondary | ICD-10-CM | POA: Diagnosis not present

## 2022-03-31 DIAGNOSIS — G894 Chronic pain syndrome: Secondary | ICD-10-CM | POA: Diagnosis not present

## 2022-03-31 DIAGNOSIS — Z6823 Body mass index (BMI) 23.0-23.9, adult: Secondary | ICD-10-CM | POA: Diagnosis not present

## 2022-03-31 DIAGNOSIS — M1991 Primary osteoarthritis, unspecified site: Secondary | ICD-10-CM | POA: Diagnosis not present

## 2022-04-01 DIAGNOSIS — Z0100 Encounter for examination of eyes and vision without abnormal findings: Secondary | ICD-10-CM | POA: Diagnosis not present

## 2022-04-19 DIAGNOSIS — R1319 Other dysphagia: Secondary | ICD-10-CM | POA: Diagnosis not present

## 2022-04-19 DIAGNOSIS — R1310 Dysphagia, unspecified: Secondary | ICD-10-CM | POA: Insufficient documentation

## 2022-05-03 DIAGNOSIS — M5416 Radiculopathy, lumbar region: Secondary | ICD-10-CM | POA: Diagnosis not present

## 2022-05-03 DIAGNOSIS — M5459 Other low back pain: Secondary | ICD-10-CM | POA: Diagnosis not present

## 2022-05-04 DIAGNOSIS — M545 Low back pain, unspecified: Secondary | ICD-10-CM | POA: Diagnosis not present

## 2022-05-05 ENCOUNTER — Ambulatory Visit
Admission: EM | Admit: 2022-05-05 | Discharge: 2022-05-05 | Disposition: A | Discharge status: Home / Self Care | Payer: Medicare HMO | Attending: Family Medicine | Admitting: Family Medicine

## 2022-05-05 DIAGNOSIS — T7840XA Allergy, unspecified, initial encounter: Secondary | ICD-10-CM | POA: Diagnosis not present

## 2022-05-05 MED ORDER — TRIAMCINOLONE ACETONIDE 0.1 % EX CREA: 1.0000 | TOPICAL_CREAM | Freq: Two times a day (BID) | CUTANEOUS | 0 refills | Status: DC

## 2022-05-05 MED ORDER — METHYLPREDNISOLONE SODIUM SUCC 125 MG IJ SOLR
60.0000 mg | Freq: Once | INTRAMUSCULAR | Status: AC
Start: 2022-05-05 — End: 2022-05-05
  Administered 2022-05-05: 60 mg via INTRAMUSCULAR

## 2022-05-05 NOTE — ED Provider Notes (Signed)
[facility]    CSN: [id number] Arrival date & time: 05/05/22  1221      History   Chief Complaint Chief Complaint  Patient presents with   Insect Bite    HPI [name] is a 61 y.o. female.   Presenting today with an itchy, swollen, red area to the right upper breast from an insect bite 2 days ago.  Now having subsequent redness, swelling, itching extending to right underarm region.  Denies pain, drainage, numbness, tingling, decreased range of motion, fever, chills, throat itching or swelling, chest tightness, shortness of breath.  Has not been trying anything over-the-counter for symptoms.    Past Medical History:  Diagnosis Date   Anemia    Arthritis    Breast cancer ([place])    right breast cancer 5 yrs ago   Cancer [facility])     right breast cancer   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   APPENDIX IN LUQ   Depression    Dysrhythmia    hx palpitations-too much caffiene   Headache    Scoliosis     Patient Active Problem List   Diagnosis Date Noted   Women's annual routine gynecological examination 06/21/2021   Mass of upper outer quadrant of left breast 04/13/2021   Encounter for screening fecal occult blood testing 06/18/2020   Encounter for gynecological examination with Papanicolaou smear of cervix 06/18/2020   NSAID induced gastritis    Anemia 12/20/2019   Dyspepsia 10/23/2019   Simple bone cyst 11/23/2018   Constipation 04/16/2018   Encounter for screening colonoscopy 04/16/2018   Cancer of breast, intraductal, right 12/21/2016   Pap smear, as part of routine gynecological examination 12/21/2016   Anxiety 12/09/2015   Muscular aches 01/05/2015   Reactive depression 01/05/2015   Perimenopausal and postmenopausal vasomotor symptoms 10/09/2014   Hot flashes due to tamoxifen 07/28/2014   Memory loss 07/28/2014   Vision changes 07/28/2014   Routine gynecological examination 11/21/2013   Cancer of upper-inner quadrant of female breast ([place])  10/11/2013   Menorrhagia with irregular cycle 07/10/2012    Past Surgical History:  Procedure Laterality Date   ANKLE SURGERY     left ankle cyst   BACK SURGERY  2000   lumbar disc surgery   BIOPSY  01/07/2020   Procedure: BIOPSY;  Surgeon: Danie Binder, MD;  [facility];  Service: Endoscopy;;   BREAST EXCISIONAL BIOPSY Left    benign   BREAST SURGERY     left partail mastectomy-cyst   CERVICAL DISC SURGERY  1999   anterior and a posterier cerv fusionx2   CESAREAN SECTION     x2   COLONOSCOPY     COLONOSCOPY WITH PROPOFOL N/A 06/26/2018   Procedure: COLONOSCOPY WITH PROPOFOL;  Surgeon: Danie Binder, MD;  [facility];  Service: Endoscopy;  Laterality: N/A;  11:00am   DILATION AND CURETTAGE OF UTERUS     DILITATION & CURRETTAGE/HYSTROSCOPY WITH THERMACHOICE ABLATION  07/10/2012   Procedure: DILATATION & CURETTAGE/HYSTEROSCOPY WITH THERMACHOICE ABLATION;  Surgeon: [name], MD;  Location: [facility];  Service: Gynecology;  Laterality: N/A;  D5 35m in, 13 ml out, temp 87degree celcius, total therapy time 937m 16sec   ERCP     ESOPHAGOGASTRODUODENOSCOPY (EGD) WITH PROPOFOL N/A 01/07/2020   Procedure: ESOPHAGOGASTRODUODENOSCOPY (EGD) WITH PROPOFOL;  Surgeon: FiDanie BinderMD;  [facility];  Service: Endoscopy;  Laterality: N/A;  2:00pm   POLYPECTOMY  06/26/2018   Procedure: POLYPECTOMY;  Surgeon:  Fields, Marga Melnick, MD;  [facility];  Service: Endoscopy;;  colon   TUBAL LIGATION     with last c-section    OB History     Gravida  4   Para  2   Term      Preterm      AB      Living  2      SAB      IAB      Ectopic      Multiple      Live Births  2            Home Medications    Prior to Admission medications   Medication Sig Start Date End Date Taking? Authorizing Provider  triamcinolone cream (KENALOG) 0.1 % Apply 1 Application topically 2 (two) times daily. 05/05/22  Yes Volney American, PA-C   ALPRAZolam Duanne Moron) 0.5 MG tablet Take 0.5 mg by mouth as needed for anxiety.     [provider]  amoxicillin-clavulanate (AUGMENTIN) 875-125 MG tablet Take 1 tablet by mouth every 12 (twelve) hours. 01/27/22   Leath-Warren, Alda Lea, NP  calcium carbonate (OSCAL) 1500 (600 Ca) MG TABS tablet Take 600 mg of elemental calcium by mouth daily with breakfast.     [provider]  Cholecalciferol (VITAMIN D3) 10 MCG (400 UNIT) CAPS Take 400 Units by mouth daily.     [provider]  cyclobenzaprine (FLEXERIL) 10 MG tablet Take 10 mg by mouth daily as needed for muscle spasms.     [provider]  Ferrous Sulfate (IRON PO) Take by mouth.    [provider]  HYDROcodone-acetaminophen (NORCO) 10-325 MG tablet Take 1 tablet by mouth every 6 (six) hours as needed for moderate pain.     [provider]  IBU 800 MG tablet Take 800 mg by mouth every 8 (eight) hours as needed for moderate pain. 02/01/18   [provider]  Multiple Vitamins-Minerals (MULTIVITAMIN WOMEN 50+ PO) Take 1 tablet by mouth daily.    [provider]  pregabalin (LYRICA) 75 MG capsule Take 75 mg by mouth 2 (two) times daily.    [provider]  venlafaxine XR (EFFEXOR-XR) 150 MG 24 hr capsule     [provider]  vitamin B-12 (CYANOCOBALAMIN) 500 MCG tablet Take 500 mcg by mouth.    [provider]    Family History Family History  Problem Relation Age of Onset   Hypertension Mother    Colon cancer Neg Hx    Gastric cancer Neg Hx    Esophageal cancer Neg Hx     Social History Social History   Tobacco Use   Smoking status: Never   Smokeless tobacco: Never  Vaping Use   Vaping Use: Never used  Substance Use Topics   Alcohol use: No   Drug use: No     Allergies   Patient has no known allergies.   Review of Systems Review of Systems Per HPI  Physical Exam Triage Vital Signs ED Triage Vitals  Enc Vitals Group      BP 05/05/22 1311 104/73     Pulse Rate 05/05/22 1311 93     Resp 05/05/22 1311 16     Temp 05/05/22 1311 97.9 F (36.6 C)     Temp Source 05/05/22 1311 Oral     SpO2 05/05/22 1311 98 %     Weight --      Height --  Head Circumference --      Peak Flow --      Pain Score 05/05/22 1314 0     Pain Loc --      Pain Edu? --      Excl. in [place]? --    No data found.  Updated Vital Signs BP 104/73 (BP Location: Left Arm)   Pulse 93   Temp 97.9 F (36.6 C) (Oral)   Resp 16   SpO2 98%   Visual Acuity Right Eye Distance:   Left Eye Distance:   Bilateral Distance:    Right Eye Near:   Left Eye Near:    Bilateral Near:     Physical Exam Vitals and nursing note reviewed.  Constitutional:      Appearance: Normal appearance. She is not ill-appearing.  HENT:     Head: Atraumatic.     Mouth/Throat:     Mouth: Mucous membranes are moist.  Eyes:     Extraocular Movements: Extraocular movements intact.     Conjunctiva/sclera: Conjunctivae normal.  Cardiovascular:     Rate and Rhythm: Normal rate and regular rhythm.     Heart sounds: Normal heart sounds.  Pulmonary:     Effort: Pulmonary effort is normal.     Breath sounds: Normal breath sounds. No wheezing or rales.  Musculoskeletal:        General: Normal range of motion.     Cervical back: Normal range of motion and neck supple.  Skin:    General: Skin is warm.     Comments: Area of erythema, edema extending from right breast to right axilla.  Warm to the touch.  No fluctuance, induration, drainage, wound  Neurological:     Mental Status: She is alert and oriented to person, place, and time.     Comments: Right upper extremity neurovascular intact  Psychiatric:        Mood and Affect: Mood normal.        Thought Content: Thought content normal.        Judgment: Judgment normal.      UC Treatments / Results  Labs (all labs ordered are listed, but only abnormal results are displayed) Labs Reviewed - No data to  display  EKG   Radiology No results found.  Procedures Procedures (including critical care time)  Medications Ordered in UC Medications  methylPREDNISolone sodium succinate (SOLU-MEDROL) 125 mg/2 mL injection 60 mg (60 mg Intramuscular Given 05/05/22 1341)    Initial Impression / Assessment and Plan / UC Course  I have reviewed the triage vital signs and the nursing notes.  Pertinent labs & imaging results that were available during my care of the patient were reviewed by me and considered in my medical decision making (see chart for details).     Suspect localized site reaction.  Treat with IM Solu-Medrol, triamcinolone, ice, antihistamines.  Return for worsening symptoms.  Final Clinical Impressions(s) / UC Diagnoses   Final diagnoses:  Allergic reaction, initial encounter     Discharge Instructions      The itchy and red area in her underarm may be a localized allergic response to the bug bite that you received.  We have given you a steroid shot and steroid cream to help with this.  You may also take an antihistamine such as Zyrtec daily additionally.  Ice the area off-and-on.  Follow-up if it is worsening at any time.     ED Prescriptions     Medication Sig Dispense Auth. Provider  triamcinolone cream (KENALOG) 0.1 % Apply 1 Application topically 2 (two) times daily. 60 g Volney American, [place]      PDMP not reviewed this encounter.   Volney American, [place] 05/05/22 1354

## 2022-05-05 NOTE — ED Triage Notes (Signed)
Pt present insect bite on her upper right breast are, pt states this happen two days ago. The area is itching and has spread.

## 2022-05-05 NOTE — Discharge Instructions (Signed)
The itchy and red area in her underarm may be a localized allergic response to the bug bite that you received.  We have given you a steroid shot and steroid cream to help with this.  You may also take an antihistamine such as Zyrtec daily additionally.  Ice the area off-and-on.  Follow-up if it is worsening at any time.

## 2022-05-06 DIAGNOSIS — M545 Low back pain, unspecified: Secondary | ICD-10-CM | POA: Diagnosis not present

## 2022-05-10 ENCOUNTER — Other Ambulatory Visit: Payer: Self-pay | Admitting: Orthopedic Surgery

## 2022-05-10 DIAGNOSIS — G8929 Other chronic pain: Secondary | ICD-10-CM

## 2022-05-11 ENCOUNTER — Ambulatory Visit
Admission: RE | Admit: 2022-05-11 | Discharge: 2022-05-11 | Disposition: A | Discharge status: Home / Self Care | Payer: Medicare HMO | Source: Ambulatory Visit | Attending: Orthopedic Surgery | Admitting: Orthopedic Surgery

## 2022-05-11 DIAGNOSIS — M545 Low back pain, unspecified: Secondary | ICD-10-CM | POA: Diagnosis not present

## 2022-05-11 DIAGNOSIS — G8929 Other chronic pain: Secondary | ICD-10-CM

## 2022-05-11 MED ORDER — METHYLPREDNISOLONE ACETATE 40 MG/ML INJ SUSP (RADIOLOG
80.0000 mg | Freq: Once | INTRAMUSCULAR | Status: AC
  Administered 2022-05-11: 80 mg via EPIDURAL

## 2022-05-11 MED ORDER — IOPAMIDOL (ISOVUE-M 200) INJECTION 41%
1.0000 mL | Freq: Once | INTRAMUSCULAR | Status: AC
  Administered 2022-05-11: 1 mL via EPIDURAL

## 2022-05-11 NOTE — Discharge Instructions (Signed)

## 2022-05-24 DIAGNOSIS — G894 Chronic pain syndrome: Secondary | ICD-10-CM | POA: Diagnosis not present

## 2022-05-24 DIAGNOSIS — F339 Major depressive disorder, recurrent, unspecified: Secondary | ICD-10-CM | POA: Diagnosis not present

## 2022-05-24 DIAGNOSIS — I10 Essential (primary) hypertension: Secondary | ICD-10-CM | POA: Diagnosis not present

## 2022-05-24 DIAGNOSIS — E063 Autoimmune thyroiditis: Secondary | ICD-10-CM | POA: Diagnosis not present

## 2022-05-24 DIAGNOSIS — Z6822 Body mass index (BMI) 22.0-22.9, adult: Secondary | ICD-10-CM | POA: Diagnosis not present

## 2022-05-27 DIAGNOSIS — M961 Postlaminectomy syndrome, not elsewhere classified: Secondary | ICD-10-CM | POA: Diagnosis not present

## 2022-05-27 DIAGNOSIS — M545 Low back pain, unspecified: Secondary | ICD-10-CM | POA: Diagnosis not present

## 2022-06-02 ENCOUNTER — Other Ambulatory Visit (HOSPITAL_COMMUNITY): Payer: Self-pay | Admitting: Internal Medicine

## 2022-06-02 DIAGNOSIS — Z1231 Encounter for screening mammogram for malignant neoplasm of breast: Secondary | ICD-10-CM

## 2022-06-06 ENCOUNTER — Ambulatory Visit
Admission: EM | Admit: 2022-06-06 | Discharge: 2022-06-06 | Disposition: A | Discharge status: Home / Self Care | Payer: Medicare HMO | Attending: Family Medicine | Admitting: Family Medicine

## 2022-06-06 DIAGNOSIS — R4589 Other symptoms and signs involving emotional state: Secondary | ICD-10-CM | POA: Diagnosis not present

## 2022-06-06 MED ORDER — SERTRALINE HCL 50 MG PO TABS: 50.0000 mg | ORAL_TABLET | Freq: Every day | ORAL | 0 refills | Status: DC

## 2022-06-06 NOTE — ED Triage Notes (Signed)
Pt states Dr took her off effexor last week. Pt states she weaned off and has been depressed and crying a lot

## 2022-06-06 NOTE — ED Provider Notes (Signed)
[facility]    CSN: [id number] Arrival date & time: 06/06/22  1124      History   Chief Complaint Chief Complaint  Patient presents with   Depression    HPI [name] is a 61 y.o. female.   Patient presenting today with concerns of worsening depressed mood, crying spells off and on worst the past week.  States her primary care provider had started her on Effexor several months ago, she is unclear at this time just how long she has been on the medication.  She states the medicine was not seeming to help with her symptoms much so she started tapering off of it and has been off for about 5 days now.  She denies suicidal or homicidal ideation, loss of motivation, appetite changes.  Has not followed up with her primary care provider since symptoms started worsening off the Effexor.    Past Medical History:  Diagnosis Date   Anemia    Arthritis    Breast cancer ([place])    right breast cancer 5 yrs ago   Cancer [facility])     right breast cancer   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   APPENDIX IN LUQ   Depression    Dysrhythmia    hx palpitations-too much caffiene   Headache    Scoliosis     Patient Active Problem List   Diagnosis Date Noted   Women's annual routine gynecological examination 06/21/2021   Mass of upper outer quadrant of left breast 04/13/2021   Encounter for screening fecal occult blood testing 06/18/2020   Encounter for gynecological examination with Papanicolaou smear of cervix 06/18/2020   NSAID induced gastritis    Anemia 12/20/2019   Dyspepsia 10/23/2019   Simple bone cyst 11/23/2018   Constipation 04/16/2018   Encounter for screening colonoscopy 04/16/2018   Cancer of breast, intraductal, right 12/21/2016   Pap smear, as part of routine gynecological examination 12/21/2016   Anxiety 12/09/2015   Muscular aches 01/05/2015   Reactive depression 01/05/2015   Perimenopausal and postmenopausal vasomotor symptoms 10/09/2014   Hot flashes  due to tamoxifen 07/28/2014   Memory loss 07/28/2014   Vision changes 07/28/2014   Routine gynecological examination 11/21/2013   Cancer of upper-inner quadrant of female breast ([place]) 10/11/2013   Menorrhagia with irregular cycle 07/10/2012    Past Surgical History:  Procedure Laterality Date   ANKLE SURGERY     left ankle cyst   BACK SURGERY  2000   lumbar disc surgery   BIOPSY  01/07/2020   Procedure: BIOPSY;  Surgeon: Danie Binder, MD;  [facility];  Service: Endoscopy;;   BREAST EXCISIONAL BIOPSY Left    benign   BREAST SURGERY     left partail mastectomy-cyst   CERVICAL DISC SURGERY  1999   anterior and a posterier cerv fusionx2   CESAREAN SECTION     x2   COLONOSCOPY     COLONOSCOPY WITH PROPOFOL N/A 06/26/2018   Procedure: COLONOSCOPY WITH PROPOFOL;  Surgeon: Danie Binder, MD;  [facility];  Service: Endoscopy;  Laterality: N/A;  11:00am   DILATION AND CURETTAGE OF UTERUS     DILITATION & CURRETTAGE/HYSTROSCOPY WITH THERMACHOICE ABLATION  07/10/2012   Procedure: DILATATION & CURETTAGE/HYSTEROSCOPY WITH THERMACHOICE ABLATION;  Surgeon: [name], MD;  Location: [facility];  Service: Gynecology;  Laterality: N/A;  D5 89m in, 13 ml out, temp 87degree celcius, total therapy time 979m 16sec   ERCP  ESOPHAGOGASTRODUODENOSCOPY (EGD) WITH PROPOFOL N/A 01/07/2020   Procedure: ESOPHAGOGASTRODUODENOSCOPY (EGD) WITH PROPOFOL;  Surgeon: Danie Binder, MD;  [facility];  Service: Endoscopy;  Laterality: N/A;  2:00pm   POLYPECTOMY  06/26/2018   Procedure: POLYPECTOMY;  Surgeon: Danie Binder, MD;  [facility];  Service: Endoscopy;;  colon   TUBAL LIGATION     with last c-section    OB History     Gravida  4   Para  2   Term      Preterm      AB      Living  2      SAB      IAB      Ectopic      Multiple      Live Births  2          Home Medications    Prior to Admission medications   Medication  Sig Start Date End Date Taking? Authorizing Provider  sertraline (ZOLOFT) 50 MG tablet Take 1 tablet (50 mg total) by mouth daily. 06/06/22  Yes Volney American, PA-C  ALPRAZolam Duanne Moron) 0.5 MG tablet Take 0.5 mg by mouth as needed for anxiety.     [provider]  amoxicillin-clavulanate (AUGMENTIN) 875-125 MG tablet Take 1 tablet by mouth every 12 (twelve) hours. 01/27/22   Leath-Warren, Alda Lea, NP  calcium carbonate (OSCAL) 1500 (600 Ca) MG TABS tablet Take 600 mg of elemental calcium by mouth daily with breakfast.     [provider]  Cholecalciferol (VITAMIN D3) 10 MCG (400 UNIT) CAPS Take 400 Units by mouth daily.     [provider]  cyclobenzaprine (FLEXERIL) 10 MG tablet Take 10 mg by mouth daily as needed for muscle spasms.     [provider]  Ferrous Sulfate (IRON PO) Take by mouth.    [provider]  HYDROcodone-acetaminophen (NORCO) 10-325 MG tablet Take 1 tablet by mouth every 6 (six) hours as needed for moderate pain.     [provider]  IBU 800 MG tablet Take 800 mg by mouth every 8 (eight) hours as needed for moderate pain. 02/01/18   [provider]  Multiple Vitamins-Minerals (MULTIVITAMIN WOMEN 50+ PO) Take 1 tablet by mouth daily.    [provider]  pregabalin (LYRICA) 75 MG capsule Take 75 mg by mouth 2 (two) times daily.    [provider]  triamcinolone cream (KENALOG) 0.1 % Apply 1 Application topically 2 (two) times daily. 05/05/22   Volney American, PA-C  vitamin B-12 (CYANOCOBALAMIN) 500 MCG tablet Take 500 mcg by mouth.    [provider]    Family History Family History  Problem Relation Age of Onset   Hypertension Mother    Colon cancer Neg Hx    Gastric cancer Neg Hx    Esophageal cancer Neg Hx     Social History Social History   Tobacco Use   Smoking status: Never   Smokeless tobacco: Never  Vaping Use   Vaping Use: Never used  Substance Use  Topics   Alcohol use: No   Drug use: No   Allergies   Patient has no known allergies.   Review of Systems Review of Systems PER HPI  Physical Exam Triage Vital Signs ED Triage Vitals [06/06/22 1209]  Enc Vitals Group     BP 120/81     Pulse Rate 94     Resp 18     Temp 97.9  F (36.6 C)     Temp src      SpO2 99 %     Weight      Height      Head Circumference      Peak Flow      Pain Score      Pain Loc      Pain Edu?      Excl. in [place]?    No data found.  Updated Vital Signs BP 120/81   Pulse 94   Temp 97.9 F (36.6 C)   Resp 18   SpO2 99%   Visual Acuity Right Eye Distance:   Left Eye Distance:   Bilateral Distance:    Right Eye Near:   Left Eye Near:    Bilateral Near:     Physical Exam Vitals and nursing note reviewed.  Constitutional:      Appearance: Normal appearance. She is not ill-appearing.  HENT:     Head: Atraumatic.     Mouth/Throat:     Mouth: Mucous membranes are moist.  Eyes:     Extraocular Movements: Extraocular movements intact.     Conjunctiva/sclera: Conjunctivae normal.  Cardiovascular:     Rate and Rhythm: Normal rate and regular rhythm.     Heart sounds: Normal heart sounds.  Pulmonary:     Effort: Pulmonary effort is normal.     Breath sounds: Normal breath sounds.  Musculoskeletal:        General: Normal range of motion.     Cervical back: Normal range of motion and neck supple.  Skin:    General: Skin is warm and dry.  Neurological:     Mental Status: She is alert and oriented to person, place, and time.     Motor: No weakness.     Gait: Gait normal.  Psychiatric:        Mood and Affect: Mood normal.        Thought Content: Thought content normal.        Judgment: Judgment normal.    UC Treatments / Results  Labs (all labs ordered are listed, but only abnormal results are displayed) Labs Reviewed - No data to display  EKG  Radiology No results found.  Procedures Procedures (including critical care  time)  Medications Ordered in UC Medications - No data to display  Initial Impression / Assessment and Plan / UC Course  I have reviewed the triage vital signs and the nursing notes.  Pertinent labs & imaging results that were available during my care of the patient were reviewed by me and considered in my medical decision making (see chart for details).     Patient has weaned off her Effexor and is feeling worse than when on the Effexor.  She does not wish to restart the Effexor at this time but is open to trying a similar medication until she can follow-up with her primary care provider.  We will start her on low-dose Zoloft and discussed to follow-up within the next 2 weeks for a recheck with her primary care provider.  Sooner if having any issues with the medication.  Also gave resources for the 24-hour behavioral health urgent care in case symptoms worsening and discussed emergency department for suicidal or homicidal ideation.   Final Clinical Impressions(s) / UC Diagnoses   Final diagnoses:  Depressed mood  Crying     Discharge Instructions      I have sent over a new, similar medication for you to try.  I want you to be seen by your primary care provider within the next 2 weeks for a follow-up.  If you start having any issues in the meantime follow-up right away.  I am placing resources for a 24-hour behavioral health urgent care below in case you end up needing it.  If you ever feel like you might harm yourself or others, go directly to the emergency department.  [facility] Address: [address] [place], [place] [address] Phone: [phone]    ED Prescriptions     Medication Sig Dispense Auth. Provider   sertraline (ZOLOFT) 50 MG tablet Take 1 tablet (50 mg total) by mouth daily. 30 tablet Volney American, [place]      PDMP not reviewed this encounter.   Volney American, [place] 06/06/22 1356

## 2022-06-06 NOTE — Discharge Instructions (Signed)
I have sent over a new, similar medication for you to try.  I want you to be seen by your primary care provider within the next 2 weeks for a follow-up.  If you start having any issues in the meantime follow-up right away.  I am placing resources for a 24-hour behavioral health urgent care below in case you end up needing it.  If you ever feel like you might harm yourself or others, go directly to the emergency department.  [facility] Address: [address] [place], [place] [address] Phone: [phone]

## 2022-06-27 ENCOUNTER — Ambulatory Visit: Payer: Medicare HMO | Admitting: Obstetrics & Gynecology

## 2022-06-29 ENCOUNTER — Ambulatory Visit (HOSPITAL_COMMUNITY)
Admission: RE | Admit: 2022-06-29 | Discharge: 2022-06-29 | Disposition: A | Discharge status: Home / Self Care | Payer: Medicare HMO | Source: Ambulatory Visit | Attending: Internal Medicine | Admitting: Internal Medicine

## 2022-06-29 DIAGNOSIS — Z1231 Encounter for screening mammogram for malignant neoplasm of breast: Secondary | ICD-10-CM | POA: Insufficient documentation

## 2022-08-02 ENCOUNTER — Ambulatory Visit: Payer: Medicare HMO | Admitting: Vascular Surgery

## 2022-08-02 ENCOUNTER — Ambulatory Visit (HOSPITAL_COMMUNITY): Payer: Self-pay | Admitting: Orthopedic Surgery

## 2022-08-02 ENCOUNTER — Other Ambulatory Visit: Payer: Self-pay

## 2022-08-02 ENCOUNTER — Encounter: Payer: Self-pay | Admitting: Vascular Surgery

## 2022-08-02 VITALS — BP 125/87 | HR 94 | Temp 97.2°F | Resp 16 | Ht 67.0 in | Wt 146.0 lb

## 2022-08-02 DIAGNOSIS — M48061 Spinal stenosis, lumbar region without neurogenic claudication: Secondary | ICD-10-CM | POA: Insufficient documentation

## 2022-08-02 DIAGNOSIS — M5416 Radiculopathy, lumbar region: Secondary | ICD-10-CM | POA: Diagnosis not present

## 2022-08-02 DIAGNOSIS — M545 Low back pain, unspecified: Secondary | ICD-10-CM | POA: Insufficient documentation

## 2022-08-02 NOTE — Progress Notes (Signed)
Patient name: [name] MRN: [id number] DOB: 1960-12-05 Sex: female  REASON FOR CONSULT: Abdominal exposure for L4-L5 ALIF  HPI: [name] is a 61 y.o. female, with history of breast cancer as well as chronic lower back pain that presents for evaluation of anterior abdominal exposure for L4-L5 ALIF.  Patient describes about 6 months of severe lower back pain with left leg radiculopathy.  She has been evaluated by Dr. Rolena Infante who feels that most of her pathology is related to L4-L5 pain generator.  He has asked vascular surgery to assist with anterior lumbar spine exposure at L4-L5.  She states she has had previous C-section and tubal ligation.  Past Medical History:  Diagnosis Date   Anemia    Arthritis    Breast cancer ([place])    right breast cancer 5 yrs ago   Cancer [facility])     right breast cancer   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   APPENDIX IN LUQ   Depression    Dysrhythmia    hx palpitations-too much caffiene   Headache    Scoliosis     Past Surgical History:  Procedure Laterality Date   ANKLE SURGERY     left ankle cyst   BACK SURGERY  2000   lumbar disc surgery   BIOPSY  01/07/2020   Procedure: BIOPSY;  Surgeon: Danie Binder, MD;  [facility];  Service: Endoscopy;;   BREAST EXCISIONAL BIOPSY Left    benign   BREAST SURGERY     left partail mastectomy-cyst   CERVICAL [place]   anterior and a posterier cerv fusionx2   CESAREAN SECTION     x2   COLONOSCOPY     COLONOSCOPY WITH PROPOFOL N/A 06/26/2018   Procedure: COLONOSCOPY WITH PROPOFOL;  Surgeon: Danie Binder, MD;  Location: [facility];  Service: Endoscopy;  Laterality: N/A;  11:00am   DILATION AND CURETTAGE OF UTERUS     DILITATION & CURRETTAGE/HYSTROSCOPY WITH THERMACHOICE ABLATION  07/10/2012   Procedure: DILATATION & CURETTAGE/HYSTEROSCOPY WITH THERMACHOICE ABLATION;  Surgeon: [name], MD;  Location: [facility];  Service: Gynecology;  Laterality: N/A;  D5 39m in, 13  ml out, temp 87degree celcius, total therapy time 934m 16sec   ERCP     ESOPHAGOGASTRODUODENOSCOPY (EGD) WITH PROPOFOL N/A 01/07/2020   Procedure: ESOPHAGOGASTRODUODENOSCOPY (EGD) WITH PROPOFOL;  Surgeon: FiDanie BinderMD;  [facility];  Service: Endoscopy;  Laterality: N/A;  2:00pm   POLYPECTOMY  06/26/2018   Procedure: POLYPECTOMY;  Surgeon: FiDanie BinderMD;  Location: [facility];  Service: Endoscopy;;  colon   TUBAL LIGATION     with last c-section    Family History  Problem Relation Age of Onset   Hypertension Mother    Colon cancer Neg Hx    Gastric cancer Neg Hx    Esophageal cancer Neg Hx     SOCIAL HISTORY: Social History   Socioeconomic History   Marital status: Married    Spouse name: Not on file   Number of children: 2   Years of education: Not on file   Highest education level: Not on file  Occupational History   Not on file  Tobacco Use   Smoking status: Never   Smokeless tobacco: Never  Vaping Use   Vaping Use: Never used  Substance and Sexual Activity   Alcohol use: No   Drug use: No   Sexual activity: Yes    Birth control/protection: Surgical,  Post-menopausal    Comment: tubal & ablation  Other Topics Concern   Not on file  Social History Narrative   Not on file   Social Determinants of Health   Financial Resource Strain: Unknown (06/21/2021)   Overall Financial Resource Strain (CARDIA)    Difficulty of Paying Living Expenses: Patient refused  Food Insecurity: No Food Insecurity (06/21/2021)   Hunger Vital Sign    Worried About Running Out of Food in the Last Year: Never true    [place] in the Last Year: Never true  Transportation Needs: Unknown (06/21/2021)   PRAPARE - Transportation    Lack of Transportation (Medical): Patient refused    Lack of Transportation (Non-Medical): Patient refused  Physical Activity: Unknown (06/21/2021)   Exercise Vital Sign    Days of Exercise per Week: Patient refused    Minutes of  Exercise per Session: Patient refused  Stress: Unknown (06/21/2021)   [place]    Feeling of Stress : Patient refused  Social Connections: Unknown (06/21/2021)   Social Connection and Isolation Panel [NHANES]    Frequency of Communication with Friends and Family: Patient refused    Frequency of Social Gatherings with Friends and Family: Patient refused    Attends Religious Services: Patient refused    Active Member of Clubs or Organizations: Patient refused    Attends Archivist Meetings: Patient refused    Marital Status: Patient refused  Intimate Partner Violence: Unknown (06/21/2021)   Humiliation, Afraid, Rape, and Kick questionnaire    Fear of Current or Ex-Partner: Patient refused    Emotionally Abused: Patient refused    Physically Abused: Patient refused    Sexually Abused: Patient refused    No Known Allergies  Current Outpatient Medications  Medication Sig Dispense Refill   ALPRAZolam (XANAX) 0.5 MG tablet Take 0.5 mg by mouth as needed for anxiety.      amitriptyline (ELAVIL) 50 MG tablet Take by mouth.     benzonatate (TESSALON) 200 MG capsule      bethanechol (URECHOLINE) 5 MG tablet Take 5 mg by mouth 3 (three) times daily.     buPROPion (WELLBUTRIN XL) 150 MG 24 hr tablet Take 150 mg by mouth daily.     calcium carbonate (OSCAL) 1500 (600 Ca) MG TABS tablet Take 600 mg of elemental calcium by mouth daily with breakfast.      celecoxib (CELEBREX) 200 MG capsule      Cholecalciferol (D 1000) 25 MCG (1000 UT) capsule Take by mouth.     colchicine 0.6 MG tablet      cyclobenzaprine (FLEXERIL) 10 MG tablet Take 10 mg by mouth daily as needed for muscle spasms.      dexAMETHasone 1.5 MG (21) TBPK      dicyclomine (BENTYL) 10 MG capsule      diphenhydrAMINE (BENADRYL) 25 mg capsule Take by mouth.     escitalopram (LEXAPRO) 20 MG tablet      Ferrous Sulfate (IRON PO) Take by mouth.      gabapentin (NEURONTIN) 300 MG capsule TAKE 1 CAPSULES BY MOUTH THREE TIMES DAILY.     guaiFENesin-codeine (ROBITUSSIN AC) 100-10 MG/5ML syrup      HYDROcodone-acetaminophen (NORCO) 10-325 MG tablet Take 1 tablet by mouth every 6 (six) hours as needed for moderate pain.      IBU 800 MG tablet Take 800 mg by mouth every 8 (eight) hours as needed for moderate pain.  iron polysaccharides (NIFEREX) 150 MG capsule Take by mouth.     lidocaine (XYLOCAINE) 2 % solution      meclizine (ANTIVERT) 25 MG tablet      methocarbamol (ROBAXIN) 750 MG tablet      Multiple Vitamins-Minerals (MULTIVITAMIN WOMEN 50+ PO) Take 1 tablet by mouth daily.     Omeprazole 20 MG TBDD      ondansetron (ZOFRAN-ODT) 8 MG disintegrating tablet      oxyCODONE-acetaminophen (PERCOCET) 10-325 MG tablet TAKE (1) TABLET BY MOUTH EVERY SIX HOURS AS NEEDED.     pregabalin (LYRICA) 75 MG capsule Take 75 mg by mouth 2 (two) times daily.     sertraline (ZOLOFT) 50 MG tablet Take 1 tablet (50 mg total) by mouth daily. 30 tablet 0   triamcinolone cream (KENALOG) 0.1 % Apply 1 Application topically 2 (two) times daily. 60 g 0   venlafaxine (EFFEXOR) 37.5 MG tablet      Venlafaxine HCl 225 MG TB24      venlafaxine XR (EFFEXOR-XR) 150 MG 24 hr capsule Take by mouth.     vitamin B-12 (CYANOCOBALAMIN) 500 MCG tablet Take 500 mcg by mouth.     amoxicillin (AMOXIL) 500 MG capsule  (Patient not taking: Reported on 08/02/2022)     amoxicillin-clavulanate (AUGMENTIN) 875-125 MG tablet Take 1 tablet by mouth every 12 (twelve) hours. (Patient not taking: Reported on 08/02/2022) 14 tablet 0   ciprofloxacin (CIPRO) 500 MG tablet  (Patient not taking: Reported on 08/02/2022)     gabapentin (NEURONTIN) 100 MG capsule Take 100 mg by mouth 3 (three) times daily. (Patient not taking: Reported on 08/02/2022)     traMADol (ULTRAM) 50 MG tablet  (Patient not taking: Reported on 08/02/2022)     No current facility-administered medications for this visit.     REVIEW OF SYSTEMS:  '[X]'$  denotes positive finding, '[ ]'$  denotes negative finding Cardiac  Comments:  Chest pain or chest pressure:    Shortness of breath upon exertion:    Short of breath when lying flat:    Irregular heart rhythm:        Vascular    Pain in calf, thigh, or hip brought on by ambulation:    Pain in feet at night that wakes you up from your sleep:     Blood clot in your veins:    Leg swelling:         Pulmonary    Oxygen at home:    Productive cough:     Wheezing:         Neurologic    Sudden weakness in arms or legs:     Sudden numbness in arms or legs:     Sudden onset of difficulty speaking or slurred speech:    Temporary loss of vision in one eye:     Problems with dizziness:         Gastrointestinal    Blood in stool:     Vomited blood:         Genitourinary    Burning when urinating:     Blood in urine:        Psychiatric    Major depression:         Hematologic    Bleeding problems:    Problems with blood clotting too easily:        Skin    Rashes or ulcers:        Constitutional    Fever or chills:  PHYSICAL EXAM: Vitals:   08/02/22 1611  BP: 125/87  Pulse: 94  Resp: 16  Temp: (!) 97.2 F (36.2 C)  TempSrc: Temporal  SpO2: 98%  Weight: 146 lb (66.2 kg)  Height: '5\' 7"'$  (1.702 m)    GENERAL: The patient is a well-nourished female, in no acute distress. The vital signs are documented above. CARDIAC: There is a regular rate and rhythm.  VASCULAR:  Palpable DP pulses bilaterally PULMONARY: No respiratory distress. ABDOMEN: Soft and non-tender. MUSCULOSKELETAL: There are no major deformities or cyanosis. NEUROLOGIC: No focal weakness or paresthesias are detected. PSYCHIATRIC: The patient has a normal affect.  DATA:   MRI reviewed from 10/07/21    Assessment/Plan:  61 year old female with chronic lower back pain with left leg radiculopathy that vascular surgery has been asked to assist with anterior spine exposure at  L4-L5 for L4-L5 ALIF.  I have reviewed her MRI and discussed that she should be an appropriate candidate for anterior exposure.  I discussed paramedian incision over the left rectus muscle and mobilizing the rectus muscles and entering into the retroperitoneum and mobilizing peritoneum and left ureter across midline.  Discussed mobilizing left iliac artery and vein.  Discussed risk of injury to the above structures.  Look forward to assisting Dr. Rolena Infante.  All questions answered.  She is scheduled for 08/25/2022.   Marty Heck, MD [facility] Office: [phone]

## 2022-08-05 ENCOUNTER — Emergency Department (HOSPITAL_COMMUNITY): Payer: Medicare HMO

## 2022-08-05 ENCOUNTER — Other Ambulatory Visit: Payer: Self-pay

## 2022-08-05 ENCOUNTER — Ambulatory Visit: Payer: Medicare HMO | Admitting: Obstetrics & Gynecology

## 2022-08-05 ENCOUNTER — Encounter (HOSPITAL_COMMUNITY): Payer: Self-pay | Admitting: Emergency Medicine

## 2022-08-05 ENCOUNTER — Emergency Department (HOSPITAL_COMMUNITY)
Admission: EM | Admit: 2022-08-05 | Discharge: 2022-08-05 | Disposition: A | Discharge status: Home / Self Care | Payer: Medicare HMO | Attending: Emergency Medicine | Admitting: Emergency Medicine

## 2022-08-05 DIAGNOSIS — R4781 Slurred speech: Secondary | ICD-10-CM | POA: Insufficient documentation

## 2022-08-05 DIAGNOSIS — R471 Dysarthria and anarthria: Secondary | ICD-10-CM

## 2022-08-05 DIAGNOSIS — R9431 Abnormal electrocardiogram [ECG] [EKG]: Secondary | ICD-10-CM | POA: Diagnosis not present

## 2022-08-05 DIAGNOSIS — R479 Unspecified speech disturbances: Secondary | ICD-10-CM | POA: Diagnosis not present

## 2022-08-05 DIAGNOSIS — I639 Cerebral infarction, unspecified: Secondary | ICD-10-CM | POA: Diagnosis not present

## 2022-08-05 LAB — CBC WITH DIFFERENTIAL/PLATELET
Abs Immature Granulocytes: 0.01 10*3/uL (ref 0.00–0.07)
Basophils Absolute: 0.1 10*3/uL (ref 0.0–0.1)
Basophils Relative: 1 %
Eosinophils Absolute: 0 10*3/uL (ref 0.0–0.5)
Eosinophils Relative: 1 %
HCT: 34.3 % — ABNORMAL LOW (ref 36.0–46.0)
Hemoglobin: 11.6 g/dL — ABNORMAL LOW (ref 12.0–15.0)
Immature Granulocytes: 0 %
Lymphocytes Relative: 46 %
Lymphs Abs: 1.6 10*3/uL (ref 0.7–4.0)
MCH: 32 pg (ref 26.0–34.0)
MCHC: 33.8 g/dL (ref 30.0–36.0)
MCV: 94.5 fL (ref 80.0–100.0)
Monocytes Absolute: 0.5 10*3/uL (ref 0.1–1.0)
Monocytes Relative: 14 %
Neutro Abs: 1.4 10*3/uL — ABNORMAL LOW (ref 1.7–7.7)
Neutrophils Relative %: 38 %
Platelets: 247 10*3/uL (ref 150–400)
RBC: 3.63 MIL/uL — ABNORMAL LOW (ref 3.87–5.11)
RDW: 13.7 % (ref 11.5–15.5)
WBC: 3.6 10*3/uL — ABNORMAL LOW (ref 4.0–10.5)
nRBC: 0 % (ref 0.0–0.2)

## 2022-08-05 LAB — COMPREHENSIVE METABOLIC PANEL
ALT: 24 U/L (ref 0–44)
AST: 27 U/L (ref 15–41)
Albumin: 3.9 g/dL (ref 3.5–5.0)
Alkaline Phosphatase: 78 U/L (ref 38–126)
Anion gap: 13 (ref 5–15)
BUN: 14 mg/dL (ref 8–23)
CO2: 23 mmol/L (ref 22–32)
Calcium: 9.7 mg/dL (ref 8.9–10.3)
Chloride: 103 mmol/L (ref 98–111)
Creatinine, Ser: 0.62 mg/dL (ref 0.44–1.00)
GFR, Estimated: >60 mL/min (ref >60)
Glucose, Bld: 96 mg/dL (ref 70–99)
Potassium: 3.3 mmol/L — ABNORMAL LOW (ref 3.5–5.1)
Sodium: 139 mmol/L (ref 135–145)
Total Bilirubin: 0.6 mg/dL (ref 0.3–1.2)
Total Protein: 7.4 g/dL (ref 6.5–8.1)

## 2022-08-05 MED ORDER — IBUPROFEN 800 MG PO TABS: 800.0000 mg | ORAL_TABLET | Freq: Three times a day (TID) | ORAL | 1 refills | Status: DC | PRN

## 2022-08-05 MED ORDER — IOHEXOL 350 MG/ML SOLN
75.0000 mL | Freq: Once | INTRAVENOUS | Status: AC | PRN
  Administered 2022-08-05: 75 mL via INTRAVENOUS

## 2022-08-05 MED ORDER — LORAZEPAM 2 MG/ML IJ SOLN
0.5000 mg | Freq: Once | INTRAMUSCULAR | Status: AC
  Administered 2022-08-05: 0.5 mg via INTRAVENOUS
  Filled 2022-08-05: qty 1

## 2022-08-05 NOTE — ED Triage Notes (Addendum)
Pt states has been taking vicodin/flexaril and xanax due to back pain. Has back surgery scheduled. Pt a/o. Having difficulty with finding words but oriented. Pt here today due to back pain and unable to speak what she wants to say. Color wnl. Non diahoretic. Stats the dysarthria has been intermittent x 1 week

## 2022-08-05 NOTE — Discharge Instructions (Signed)
Stop taking the Vicodin.  Follow-up with your doctor next week

## 2022-08-05 NOTE — ED Provider Notes (Signed)
[facility] EMERGENCY DEPARTMENT Provider Note   CSN: [id number] Arrival date & time: 08/05/22  4562     History {Add pertinent medical, surgical, social history, OB history to HPI:1} Chief Complaint  Patient presents with   Aphasia    [name] is a 61 y.o. female.  Patient brought in here for slurred speech.  Supposedly this happens every time she takes her Vicodin and Flexeril and Xanax and Neurontin.   Altered Mental Status      Home Medications Prior to Admission medications   Medication Sig Start Date End Date Taking? Authorizing Provider  ibuprofen (ADVIL) 800 MG tablet Take 1 tablet (800 mg total) by mouth every 8 (eight) hours as needed for moderate pain. 08/05/22  Yes Milton Ferguson, MD  ALPRAZolam Duanne Moron) 0.5 MG tablet Take 0.5 mg by mouth as needed for anxiety.     [provider]  amitriptyline (ELAVIL) 50 MG tablet Take by mouth.    [provider]  amoxicillin (AMOXIL) 500 MG capsule     [provider]  amoxicillin-clavulanate (AUGMENTIN) 875-125 MG tablet Take 1 tablet by mouth every 12 (twelve) hours. Patient not taking: Reported on 08/02/2022 01/27/22   Leath-Warren, Alda Lea, NP  benzonatate (TESSALON) 200 MG capsule     [provider]  bethanechol (URECHOLINE) 5 MG tablet Take 5 mg by mouth 3 (three) times daily.    [provider]  buPROPion (WELLBUTRIN XL) 150 MG 24 hr tablet Take 150 mg by mouth daily.    [provider]  calcium carbonate (OSCAL) 1500 (600 Ca) MG TABS tablet Take 600 mg of elemental calcium by mouth daily with breakfast.     [provider]  celecoxib (CELEBREX) 200 MG capsule     [provider]  Cholecalciferol (D 1000) 25 MCG (1000 UT) capsule Take by mouth.    [provider]  ciprofloxacin (CIPRO) 500 MG tablet     [provider]  colchicine 0.6 MG tablet     [provider]  cyclobenzaprine (FLEXERIL) 10 MG tablet Take 10 mg by  mouth daily as needed for muscle spasms.     [provider]  dexAMETHasone 1.5 MG (21) TBPK     [provider]  dicyclomine (BENTYL) 10 MG capsule     [provider]  diphenhydrAMINE (BENADRYL) 25 mg capsule Take by mouth.    [provider]  escitalopram (LEXAPRO) 20 MG tablet     [provider]  Ferrous Sulfate (IRON PO) Take by mouth.    [provider]  gabapentin (NEURONTIN) 100 MG capsule Take 100 mg by mouth 3 (three) times daily. Patient not taking: Reported on 08/02/2022    [provider]  gabapentin (NEURONTIN) 300 MG capsule TAKE 1 CAPSULES BY MOUTH THREE TIMES DAILY. 06/08/21   [provider]  guaiFENesin-codeine (ROBITUSSIN AC) 100-10 MG/5ML syrup     [provider]  HYDROcodone-acetaminophen (NORCO) 10-325 MG tablet Take 1 tablet by mouth every 6 (six) hours as needed for moderate pain.     [provider]  iron polysaccharides (NIFEREX) 150 MG capsule Take by mouth.    [provider]  lidocaine (XYLOCAINE) 2 % solution     [provider]  meclizine (ANTIVERT) 25 MG tablet     [provider]  methocarbamol (ROBAXIN) 750 MG tablet     [provider]  Multiple Vitamins-Minerals (MULTIVITAMIN WOMEN 50+ PO) Take 1 tablet by mouth daily.  [provider]  Omeprazole 20 MG TBDD     [provider]  ondansetron (ZOFRAN-ODT) 8 MG disintegrating tablet     [provider]  oxyCODONE-acetaminophen (PERCOCET) 10-325 MG tablet TAKE (1) TABLET BY MOUTH EVERY SIX HOURS AS NEEDED.    [provider]  pregabalin (LYRICA) 75 MG capsule Take 75 mg by mouth 2 (two) times daily.    [provider]  sertraline (ZOLOFT) 50 MG tablet Take 1 tablet (50 mg total) by mouth daily. 06/06/22   Volney American, PA-C  traMADol Veatrice Bourbon) 50 MG tablet  10/15/18   [provider]  triamcinolone cream (KENALOG) 0.1 % Apply  1 Application topically 2 (two) times daily. 05/05/22   Volney American, PA-C  venlafaxine [facility]) 37.5 MG tablet     [provider]  Venlafaxine HCl 225 MG TB24     [provider]  venlafaxine XR (EFFEXOR-XR) 150 MG 24 hr capsule Take by mouth.    [provider]  vitamin B-12 (CYANOCOBALAMIN) 500 MCG tablet Take 500 mcg by mouth.    [provider]      Allergies    Patient has no known allergies.    Review of Systems   Review of Systems  Physical Exam Updated Vital Signs BP 130/81   Pulse 88   Temp 98.2 F (36.8 C) (Oral)   Resp 16   Ht '5\' 7"'$  (1.702 m)   Wt 66.2 kg   SpO2 100%   BMI 22.87 kg/m  Physical Exam  ED Results / Procedures / Treatments   Labs (all labs ordered are listed, but only abnormal results are displayed) Labs Reviewed  CBC WITH DIFFERENTIAL/PLATELET - Abnormal; Notable for the following components:      Result Value   WBC 3.6 (*)    RBC 3.63 (*)    Hemoglobin 11.6 (*)    HCT 34.3 (*)    Neutro Abs 1.4 (*)    All other components within normal limits  COMPREHENSIVE METABOLIC PANEL - Abnormal; Notable for the following components:   Potassium 3.3 (*)    All other components within normal limits    EKG None  Radiology No results found.  Procedures Procedures  {Document cardiac monitor, telemetry assessment procedure when appropriate:1}  Medications Ordered in ED Medications  iohexol (OMNIPAQUE) 350 MG/ML injection 75 mL (75 mLs Intravenous Contrast Given 08/05/22 0940)  LORazepam (ATIVAN) injection 0.5 mg (0.5 mg Intravenous Given 08/05/22 1122)    ED Course/ Medical Decision Making/ A&P                           Medical Decision Making Amount and/or Complexity of Data Reviewed Labs: ordered. Radiology: ordered. ECG/medicine tests: ordered.  Risk Prescription drug management.   Patient with slurred speech that has resolved.  Suspect is medication related.  It is started when she  began Vicodin so we will stop the bike and she will follow-up with PCP  {Document critical care time when appropriate:1} {Document review of labs and clinical decision tools ie heart score, Chads2Vasc2 etc:1}  {Document your independent review of radiology images, and any outside records:1} {Document your discussion with family members, caretakers, and with consultants:1} {Document social determinants of health affecting pt's care:1} {Document your decision making why or why not admission, treatments were needed:1} Final Clinical Impression(s) / ED Diagnoses Final diagnoses:  None    Rx / DC Orders ED Discharge Orders  Ordered    ibuprofen (ADVIL) 800 MG tablet  Every 8 hours PRN        08/05/22 1334

## 2022-08-05 NOTE — ED Notes (Signed)
Pt reporting 10-10 pain in her lower back. Stretcher became uncomfortable. Stretcher swapped for Psychologist, occupational.

## 2022-08-05 NOTE — ED Notes (Signed)
LKW 8pm 08/04/22

## 2022-08-08 ENCOUNTER — Encounter: Payer: Self-pay | Admitting: Obstetrics & Gynecology

## 2022-08-08 ENCOUNTER — Ambulatory Visit (INDEPENDENT_AMBULATORY_CARE_PROVIDER_SITE_OTHER): Payer: Medicare HMO | Admitting: Obstetrics & Gynecology

## 2022-08-08 VITALS — BP 133/81 | HR 100 | Ht 67.0 in | Wt 146.0 lb

## 2022-08-08 DIAGNOSIS — Z01419 Encounter for gynecological examination (general) (routine) without abnormal findings: Secondary | ICD-10-CM | POA: Diagnosis not present

## 2022-08-08 NOTE — Progress Notes (Signed)
Subjective:     [name] is a 61 y.o. female here for a routine exam.  No LMP recorded. Patient has had an ablation. G4P2 Birth Control Method:  menopausal Menstrual Calendar(currently): amenorrhea  Current complaints: none.   Current acute medical issues:  none   Recent Gynecologic History No LMP recorded. Patient has had an ablation. Last Pap: 06/25/22,  normal Last mammogram: 10/23,  normal  Past Medical History:  Diagnosis Date   Anemia    Arthritis    Breast cancer ([place])    right breast cancer 5 yrs ago   Cancer [facility])     right breast cancer   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   APPENDIX IN [facility]   Depression    Dysrhythmia    hx palpitations-too much caffiene   Headache    Scoliosis     Past Surgical History:  Procedure Laterality Date   ANKLE SURGERY     left ankle cyst   BACK SURGERY  2000   lumbar disc surgery   BIOPSY  01/07/2020   Procedure: BIOPSY;  Surgeon: Danie Binder, MD;  [facility];  Service: Endoscopy;;   BREAST EXCISIONAL BIOPSY Left    benign   BREAST SURGERY     left partail mastectomy-cyst   CERVICAL [place]   anterior and a posterier cerv fusionx2   CESAREAN SECTION     x2   COLONOSCOPY     COLONOSCOPY WITH PROPOFOL N/A 06/26/2018   Procedure: COLONOSCOPY WITH PROPOFOL;  Surgeon: Danie Binder, MD;  Location: [facility];  Service: Endoscopy;  Laterality: N/A;  11:00am   DILATION AND CURETTAGE OF UTERUS     DILITATION & CURRETTAGE/HYSTROSCOPY WITH THERMACHOICE ABLATION  07/10/2012   Procedure: DILATATION & CURETTAGE/HYSTEROSCOPY WITH THERMACHOICE ABLATION;  Surgeon: [name], MD;  Location: [facility];  Service: Gynecology;  Laterality: N/A;  D5 33m in, 13 ml out, temp 87degree celcius, total therapy time 969m 16sec   ERCP     ESOPHAGOGASTRODUODENOSCOPY (EGD) WITH PROPOFOL N/A 01/07/2020   Procedure: ESOPHAGOGASTRODUODENOSCOPY (EGD) WITH PROPOFOL;  Surgeon: FiDanie BinderMD;  Location[facility];  Service: Endoscopy;  Laterality: N/A;  2:00pm   POLYPECTOMY  06/26/2018   Procedure: POLYPECTOMY;  Surgeon: FiDanie BinderMD;  Location: [facility];  Service: Endoscopy;;  colon   TUBAL LIGATION     with last c-section    OB History     Gravida  4   Para  2   Term      Preterm      AB      Living  2      SAB      IAB      Ectopic      Multiple      Live Births  2           Social History   Socioeconomic History   Marital status: Married    Spouse name: Not on file   Number of children: 2   Years of education: Not on file   Highest education level: Not on file  Occupational History   Not on file  Tobacco Use   Smoking status: Never   Smokeless tobacco: Never  Vaping Use   Vaping Use: Never used  Substance and Sexual Activity   Alcohol use: No   Drug use: No   Sexual activity: Yes    Birth control/protection: Surgical, Post-menopausal  Comment: tubal & ablation  Other Topics Concern   Not on file  Social History Narrative   Not on file   Social Determinants of Health   Financial Resource Strain: Medium Risk (08/08/2022)   Overall Financial Resource Strain (CARDIA)    Difficulty of Paying Living Expenses: Somewhat hard  Food Insecurity: Food Insecurity Present (08/08/2022)   Hunger Vital Sign    Worried About Running Out of Food in the Last Year: Sometimes true    Ran Out of Food in the Last Year: Often true  Transportation Needs: No Transportation Needs (08/08/2022)   PRAPARE - Hydrologist (Medical): No    Lack of Transportation (Non-Medical): No  Physical Activity: Inactive (08/08/2022)   Exercise Vital Sign    Days of Exercise per Week: 0 days    Minutes of Exercise per Session: 0 min  Stress: No Stress Concern Present (08/08/2022)   [place]    Feeling of Stress : Not at all  Social Connections: Moderately Integrated  (08/08/2022)   Social Connection and Isolation Panel [NHANES]    Frequency of Communication with Friends and Family: More than three times a week    Frequency of Social Gatherings with Friends and Family: Three times a week    Attends Religious Services: 1 to 4 times per year    Active Member of Clubs or Organizations: No    Attends Archivist Meetings: Never    Marital Status: Married    Family History  Problem Relation Age of Onset   Hypertension Mother    Colon cancer Neg Hx    Gastric cancer Neg Hx    Esophageal cancer Neg Hx      Current Outpatient Medications:    ALPRAZolam (XANAX) 0.5 MG tablet, Take 0.5 mg by mouth as needed for anxiety. , Disp: , Rfl:    amitriptyline (ELAVIL) 50 MG tablet, Take by mouth., Disp: , Rfl:    benzonatate (TESSALON) 200 MG capsule, , Disp: , Rfl:    bethanechol (URECHOLINE) 5 MG tablet, Take 5 mg by mouth 3 (three) times daily., Disp: , Rfl:    buPROPion (WELLBUTRIN XL) 150 MG 24 hr tablet, Take 150 mg by mouth daily., Disp: , Rfl:    calcium carbonate (OSCAL) 1500 (600 Ca) MG TABS tablet, Take 600 mg of elemental calcium by mouth daily with breakfast. , Disp: , Rfl:    celecoxib (CELEBREX) 200 MG capsule, , Disp: , Rfl:    Cholecalciferol (D 1000) 25 MCG (1000 UT) capsule, Take by mouth., Disp: , Rfl:    colchicine 0.6 MG tablet, , Disp: , Rfl:    cyclobenzaprine (FLEXERIL) 10 MG tablet, Take 10 mg by mouth daily as needed for muscle spasms. , Disp: , Rfl:    dexAMETHasone 1.5 MG (21) TBPK, , Disp: , Rfl:    dicyclomine (BENTYL) 10 MG capsule, , Disp: , Rfl:    diphenhydrAMINE (BENADRYL) 25 mg capsule, Take by mouth., Disp: , Rfl:    escitalopram (LEXAPRO) 20 MG tablet, , Disp: , Rfl:    Ferrous Sulfate (IRON PO), Take by mouth., Disp: , Rfl:    gabapentin (NEURONTIN) 300 MG capsule, TAKE 1 CAPSULES BY MOUTH THREE TIMES DAILY., Disp: , Rfl:    guaiFENesin-codeine (ROBITUSSIN AC) 100-10 MG/5ML syrup, , Disp: , Rfl:     HYDROcodone-acetaminophen (NORCO) 10-325 MG tablet, Take 1 tablet by mouth every 6 (six) hours as needed for moderate  pain. , Disp: , Rfl:    ibuprofen (ADVIL) 800 MG tablet, Take 1 tablet (800 mg total) by mouth every 8 (eight) hours as needed for moderate pain., Disp: 21 tablet, Rfl: 1   iron polysaccharides (NIFEREX) 150 MG capsule, Take by mouth., Disp: , Rfl:    lidocaine (XYLOCAINE) 2 % solution, , Disp: , Rfl:    meclizine (ANTIVERT) 25 MG tablet, , Disp: , Rfl:    methocarbamol (ROBAXIN) 750 MG tablet, , Disp: , Rfl:    Multiple Vitamins-Minerals (MULTIVITAMIN WOMEN 50+ PO), Take 1 tablet by mouth daily., Disp: , Rfl:    Omeprazole 20 MG TBDD, , Disp: , Rfl:    ondansetron (ZOFRAN-ODT) 8 MG disintegrating tablet, , Disp: , Rfl:    oxyCODONE-acetaminophen (PERCOCET) 10-325 MG tablet, TAKE (1) TABLET BY MOUTH EVERY SIX HOURS AS NEEDED., Disp: , Rfl:    pregabalin (LYRICA) 75 MG capsule, Take 75 mg by mouth 2 (two) times daily., Disp: , Rfl:    sertraline (ZOLOFT) 50 MG tablet, Take 1 tablet (50 mg total) by mouth daily., Disp: 30 tablet, Rfl: 0   triamcinolone cream (KENALOG) 0.1 %, Apply 1 Application topically 2 (two) times daily., Disp: 60 g, Rfl: 0   venlafaxine (EFFEXOR) 37.5 MG tablet, , Disp: , Rfl:    Venlafaxine HCl 225 MG TB24, , Disp: , Rfl:    venlafaxine XR (EFFEXOR-XR) 150 MG 24 hr capsule, Take by mouth., Disp: , Rfl:    vitamin B-12 (CYANOCOBALAMIN) 500 MCG tablet, Take 500 mcg by mouth., Disp: , Rfl:    amoxicillin (AMOXIL) 500 MG capsule, , Disp: , Rfl:    amoxicillin-clavulanate (AUGMENTIN) 875-125 MG tablet, Take 1 tablet by mouth every 12 (twelve) hours. (Patient not taking: Reported on 08/02/2022), Disp: 14 tablet, Rfl: 0   ciprofloxacin (CIPRO) 500 MG tablet, , Disp: , Rfl:    gabapentin (NEURONTIN) 100 MG capsule, Take 100 mg by mouth 3 (three) times daily. (Patient not taking: Reported on 08/02/2022), Disp: , Rfl:    traMADol (ULTRAM) 50 MG tablet, , Disp: , Rfl:    Review of Systems  Review of Systems  Constitutional: Negative for fever, chills, weight loss, malaise/fatigue and diaphoresis.  HENT: Negative for hearing loss, ear pain, nosebleeds, congestion, sore throat, neck pain, tinnitus and ear discharge.   Eyes: Negative for blurred vision, double vision, photophobia, pain, discharge and redness.  Respiratory: Negative for cough, hemoptysis, sputum production, shortness of breath, wheezing and stridor.   Cardiovascular: Negative for chest pain, palpitations, orthopnea, claudication, leg swelling and PND.  Gastrointestinal: negative for abdominal pain. Negative for heartburn, nausea, vomiting, diarrhea, constipation, blood in stool and melena.  Genitourinary: Negative for dysuria, urgency, frequency, hematuria and flank pain.  Musculoskeletal: Negative for myalgias, back pain, joint pain and falls.  Skin: Negative for itching and rash.  Neurological: Negative for dizziness, tingling, tremors, sensory change, speech change, focal weakness, seizures, loss of consciousness, weakness and headaches.  Endo/Heme/Allergies: Negative for environmental allergies and polydipsia. Does not bruise/bleed easily.  Psychiatric/Behavioral: Negative for depression, suicidal ideas, hallucinations, memory loss and substance abuse. The patient is not nervous/anxious and does not have insomnia.        Objective:  Blood pressure 133/81, pulse 100, height '5\' 7"'$  (1.702 m), weight 146 lb (66.2 kg).   Physical Exam  Vitals reviewed. Constitutional: She is oriented to person, place, and time. She appears well-developed and well-nourished.  HENT:  Head: Normocephalic and atraumatic.        Right Ear:  External ear normal.  Left Ear: External ear normal.  Nose: Nose normal.  Mouth/Throat: Oropharynx is clear and moist.  Eyes: Conjunctivae and EOM are normal. Pupils are equal, round, and reactive to light. Right eye exhibits no discharge. Left eye exhibits no discharge. No  scleral icterus.  Neck: Normal range of motion. Neck supple. No tracheal deviation present. No thyromegaly present.  Cardiovascular: Normal rate, regular rhythm, normal heart sounds and intact distal pulses.  Exam reveals no gallop and no friction rub.   No murmur heard. Respiratory: Effort normal and breath sounds normal. No respiratory distress. She has no wheezes. She has no rales. She exhibits no tenderness.  GI: Soft. Bowel sounds are normal. She exhibits no distension and no mass. There is no tenderness. There is no rebound and no guarding.  Genitourinary:  Breasts no masses skin changes or nipple changes bilaterally      Vulva is normal without lesions Vagina is pink moist without discharge Cervix normal in appearance and pap is done Uterus is normal size shape and contour Adnexa is negative with normal sized ovaries   Musculoskeletal: Normal range of motion. She exhibits no edema and no tenderness.  Neurological: She is alert and oriented to person, place, and time. She has normal reflexes. She displays normal reflexes. No cranial nerve deficit. She exhibits normal muscle tone. Coordination normal.  Skin: Skin is warm and dry. No rash noted. No erythema. No pallor.  Psychiatric: She has a normal mood and affect. Her behavior is normal. Judgment and thought content normal.       Medications Ordered at today's visit: No orders of the defined types were placed in this encounter.   Other orders placed at today's visit: No orders of the defined types were placed in this encounter.     Assessment:    Normal Gyn exam.   PMB 3/23-->benign patholgy, none since Plan:   HPV based cytology in 1 year     Return in about 1 year (around 08/09/2023) for Follow up, with Dr Elonda Husky, yearly.

## 2022-08-16 DIAGNOSIS — M47896 Other spondylosis, lumbar region: Secondary | ICD-10-CM | POA: Diagnosis not present

## 2022-08-17 ENCOUNTER — Encounter (HOSPITAL_COMMUNITY): Payer: Self-pay

## 2022-08-17 NOTE — Progress Notes (Signed)
PCP/Internal Med - Dr Redmond School Cardiologist - n/a  Chest x-ray - 01/29/22 (2V) EKG - 08/05/22 Stress Test - n/a ECHO - n/a Cardiac Cath - n/a  ICD Pacemaker/Loop - n/a  Sleep Study -  n/a CPAP - none  ERAS: Clear liquids til 4:30 am DOS  Anesthesia review: Yes  STOP now taking any Aspirin (unless otherwise instructed by your surgeon), Aleve, Naproxen, Ibuprofen, Motrin, Advil, Goody's, BC's, all herbal medications, fish oil, and all vitamins.   Coronavirus Screening Do you have any of the following symptoms:  Cough yes/no: No Fever (>100.37F)  yes/no: No Runny nose yes/no: No Sore throat yes/no: No Difficulty breathing/shortness of breath  yes/no: No  Have you traveled in the last 14 days and where? yes/no: No  Patient verbalized understanding of instructions that were given to them at the PAT appointment. Patient was also instructed that they will need to review over the PAT instructions again at home before surgery.

## 2022-08-17 NOTE — Progress Notes (Signed)
Surgical Instructions    Your procedure is scheduled on Thursday December 21st.  Report to [facility] Main Entrance "A" at 5:30 A.M., then check in with the Admitting office.  Call this number if you have problems the morning of surgery:  [phone]   If you have any questions prior to your surgery date call [phone]: Open Monday-Friday 8am-4pm If you experience any cold or flu symptoms such as cough, fever, chills, shortness of breath, etc. between now and your scheduled surgery, please notify us at the above number     Remember:  Do not eat after midnight the night before your surgery  You may drink clear liquids until 4:30 the morning of your surgery.   Clear liquids allowed are: Water, Non-Citrus Juices (without pulp), Carbonated Beverages, Clear Tea, Black Coffee ONLY (NO MILK, CREAM OR POWDERED CREAMER of any kind), and Gatorade    Take these medicines the morning of surgery with A SIP OF WATER: bethanechol (URECHOLINE) 5 MG tablet  escitalopram (LEXAPRO) 20 MG tablet   IF NEEDED  ALPRAZolam (XANAX) 0.5 MG tablet  cyclobenzaprine (FLEXERIL) 10 MG tablet  gabapentin (NEURONTIN) 300 MG capsule  HYDROcodone-acetaminophen (NORCO) 10-325 MG tablet  oxyCODONE-acetaminophen (PERCOCET) 10-325 MG tablet  As of today, STOP taking any Aspirin (unless otherwise instructed by your surgeon) Aleve, Naproxen, Ibuprofen, Motrin, Advil, Goody's, BC's, all herbal medications, fish oil, and all vitamins.           Do not wear jewelry or makeup. Do not wear lotions, powders, perfumes or deodorant. Do not shave 48 hours prior to surgery.   Do not bring valuables to the hospital. Do not wear nail polish, gel polish, artificial nails, or any other type of covering on natural nails (fingers and toes) If you have artificial nails or gel coating that need to be removed by a nail salon, please have this removed prior to surgery. Artificial nails or gel coating may interfere with anesthesia's  ability to adequately monitor your vital signs.  [facility] is not responsible for any belongings or valuables.    Do NOT Smoke (Tobacco/Vaping)  24 hours prior to your procedure  If you use a CPAP at night, you may bring your mask for your overnight stay.   Contacts, glasses, hearing aids, dentures or partials may not be worn into surgery, please bring cases for these belongings   For patients admitted to the hospital, discharge time will be determined by your treatment team.   Patients discharged the day of surgery will not be allowed to drive home, and someone needs to stay with them for 24 hours.   SURGICAL WAITING ROOM VISITATION Patients having surgery or a procedure may have no more than 2 support people in the waiting area - these visitors may rotate.   Children under the age of 20 must have an adult with them who is not the patient. If the patient needs to stay at the hospital during part of their recovery, the visitor guidelines for inpatient rooms apply. Pre-op nurse will coordinate an appropriate time for 1 support person to accompany patient in pre-op.  This support person may not rotate.   Please refer to [email or web address] for the visitor guidelines for Inpatients (after your surgery is over and you are in a regular room).    Special instructions:    Oral Hygiene is also important to reduce your risk of infection.  Remember - BRUSH YOUR TEETH THE MORNING OF SURGERY WITH YOUR REGULAR TOOTHPASTE   [facility]-  Preparing For Surgery  Before surgery, you can play an important role. Because skin is not sterile, your skin needs to be as free of germs as possible. You can reduce the number of germs on your skin by washing with CHG (chlorahexidine gluconate) Soap before surgery.  CHG is an antiseptic cleaner which kills germs and bonds with the skin to continue killing germs even after washing.     Please do not use if you  have an allergy to CHG or antibacterial soaps. If your skin becomes reddened/irritated stop using the CHG.  Do not shave (including legs and underarms) for at least 48 hours prior to first CHG shower. It is OK to shave your face.  Please follow these instructions carefully.     Shower the NIGHT BEFORE SURGERY and the MORNING OF SURGERY with CHG Soap.   If you chose to wash your hair, wash your hair first as usual with your normal shampoo. After you shampoo, rinse your hair and body thoroughly to remove the shampoo.  Then [employer] and genitals (private parts) with your normal soap and rinse thoroughly to remove soap.  After that Use CHG Soap as you would any other liquid soap. You can apply CHG directly to the skin and wash gently with a scrungie or a clean washcloth.   Apply the CHG Soap to your body ONLY FROM THE NECK DOWN.  Do not use on open wounds or open sores. Avoid contact with your eyes, ears, mouth and genitals (private parts). Wash Face and genitals (private parts)  with your normal soap.   Wash thoroughly, paying special attention to the area where your surgery will be performed.  Thoroughly rinse your body with warm water from the neck down.  DO NOT shower/wash with your normal soap after using and rinsing off the CHG Soap.  Pat yourself dry with a CLEAN TOWEL.  Wear CLEAN PAJAMAS to bed the night before surgery  Place CLEAN SHEETS on your bed the night before your surgery  DO NOT SLEEP WITH PETS.   Day of Surgery:  Take a shower with CHG soap. Wear Clean/Comfortable clothing the morning of surgery Do not apply any deodorants/lotions.   Remember to brush your teeth WITH YOUR REGULAR TOOTHPASTE.    If you received a COVID test during your pre-op visit, it is requested that you wear a mask when out in public, stay away from anyone that may not be feeling well, and notify your surgeon if you develop symptoms. If you have been in contact with anyone that has tested  positive in the last 10 days, please notify your surgeon.    Please read over the following fact sheets that you were given.

## 2022-08-18 ENCOUNTER — Encounter (HOSPITAL_COMMUNITY)
Admission: RE | Admit: 2022-08-18 | Discharge: 2022-08-18 | Disposition: A | Discharge status: Home / Self Care | Payer: Medicare HMO | Source: Ambulatory Visit | Attending: Orthopedic Surgery | Admitting: Orthopedic Surgery

## 2022-08-18 ENCOUNTER — Other Ambulatory Visit: Payer: Self-pay

## 2022-08-18 ENCOUNTER — Encounter (HOSPITAL_COMMUNITY): Payer: Self-pay

## 2022-08-18 VITALS — BP 135/84 | HR 100 | Temp 97.9°F | Resp 16 | Ht 67.0 in | Wt 143.0 lb

## 2022-08-18 DIAGNOSIS — Z01818 Encounter for other preprocedural examination: Secondary | ICD-10-CM

## 2022-08-18 DIAGNOSIS — Z01812 Encounter for preprocedural laboratory examination: Secondary | ICD-10-CM | POA: Diagnosis not present

## 2022-08-18 LAB — SURGICAL PCR SCREEN
MRSA, PCR: NEGATIVE
Staphylococcus aureus: NEGATIVE

## 2022-08-18 LAB — ABO/RH: ABO/RH(D): O POS

## 2022-08-18 LAB — TYPE AND SCREEN
ABO/RH(D): O POS
Antibody Screen: NEGATIVE

## 2022-08-19 ENCOUNTER — Other Ambulatory Visit: Payer: Self-pay

## 2022-08-25 ENCOUNTER — Inpatient Hospital Stay (HOSPITAL_COMMUNITY): Payer: Medicare HMO

## 2022-08-25 ENCOUNTER — Other Ambulatory Visit: Payer: Self-pay

## 2022-08-25 ENCOUNTER — Inpatient Hospital Stay (HOSPITAL_COMMUNITY): Payer: Medicare HMO | Admitting: Certified Registered"

## 2022-08-25 ENCOUNTER — Encounter (HOSPITAL_COMMUNITY): Payer: Self-pay | Admitting: Orthopedic Surgery

## 2022-08-25 ENCOUNTER — Inpatient Hospital Stay (HOSPITAL_COMMUNITY)
Admission: RE | Admit: 2022-08-25 | Discharge: 2022-08-26 | DRG: 460 | Disposition: A | Discharge status: Home / Self Care | Payer: Medicare HMO | Attending: Orthopedic Surgery | Admitting: Orthopedic Surgery

## 2022-08-25 ENCOUNTER — Inpatient Hospital Stay (HOSPITAL_COMMUNITY)
Admission: RE | Disposition: A | Discharge status: Home / Self Care | Payer: Self-pay | Source: Home / Self Care | Attending: Orthopedic Surgery

## 2022-08-25 DIAGNOSIS — Z853 Personal history of malignant neoplasm of breast: Secondary | ICD-10-CM

## 2022-08-25 DIAGNOSIS — M419 Scoliosis, unspecified: Secondary | ICD-10-CM | POA: Diagnosis present

## 2022-08-25 DIAGNOSIS — Z981 Arthrodesis status: Principal | ICD-10-CM

## 2022-08-25 DIAGNOSIS — M961 Postlaminectomy syndrome, not elsewhere classified: Secondary | ICD-10-CM

## 2022-08-25 DIAGNOSIS — G8929 Other chronic pain: Secondary | ICD-10-CM | POA: Diagnosis present

## 2022-08-25 DIAGNOSIS — Y838 Other surgical procedures as the cause of abnormal reaction of the patient, or of later complication, without mention of misadventure at the time of the procedure: Secondary | ICD-10-CM | POA: Diagnosis present

## 2022-08-25 DIAGNOSIS — M4326 Fusion of spine, lumbar region: Secondary | ICD-10-CM | POA: Diagnosis not present

## 2022-08-25 DIAGNOSIS — M5116 Intervertebral disc disorders with radiculopathy, lumbar region: Secondary | ICD-10-CM | POA: Diagnosis not present

## 2022-08-25 DIAGNOSIS — M5136 Other intervertebral disc degeneration, lumbar region: Secondary | ICD-10-CM | POA: Diagnosis not present

## 2022-08-25 DIAGNOSIS — Z8249 Family history of ischemic heart disease and other diseases of the circulatory system: Secondary | ICD-10-CM

## 2022-08-25 HISTORY — PX: ANTERIOR LUMBAR FUSION: SHX1170

## 2022-08-25 HISTORY — PX: ABDOMINAL EXPOSURE: SHX5708

## 2022-08-25 HISTORY — PX: HARVEST BONE GRAFT: SHX377

## 2022-08-25 LAB — CREATININE, SERUM
Creatinine, Ser: 0.74 mg/dL (ref 0.44–1.00)
GFR, Estimated: >60 mL/min (ref >60)

## 2022-08-25 LAB — CBC
HCT: 36.8 % (ref 36.0–46.0)
Hemoglobin: 11.9 g/dL — ABNORMAL LOW (ref 12.0–15.0)
MCH: 31.3 pg (ref 26.0–34.0)
MCHC: 32.3 g/dL (ref 30.0–36.0)
MCV: 96.8 fL (ref 80.0–100.0)
Platelets: 232 10*3/uL (ref 150–400)
RBC: 3.8 MIL/uL — ABNORMAL LOW (ref 3.87–5.11)
RDW: 13.4 % (ref 11.5–15.5)
WBC: 8.5 10*3/uL (ref 4.0–10.5)
nRBC: 0 % (ref 0.0–0.2)

## 2022-08-25 SURGERY — ANTERIOR LUMBAR FUSION 1 LEVEL
Anesthesia: General | Site: Spine Lumbar

## 2022-08-25 MED ORDER — MAGNESIUM CITRATE PO SOLN
1.0000 | Freq: Once | ORAL | Status: AC | PRN
  Administered 2022-08-26: 1 via ORAL
  Filled 2022-08-25: qty 296

## 2022-08-25 MED ORDER — 0.9 % SODIUM CHLORIDE (POUR BTL) OPTIME
TOPICAL | Status: DC | PRN
  Administered 2022-08-25 (×2): 1000 mL

## 2022-08-25 MED ORDER — OXYCODONE HCL 5 MG PO TABS
5.0000 mg | ORAL_TABLET | ORAL | Status: DC | PRN
  Administered 2022-08-25 – 2022-08-26 (×3): 5 mg via ORAL
  Filled 2022-08-25 (×3): qty 1

## 2022-08-25 MED ORDER — METHOCARBAMOL 500 MG PO TABS: 500.0000 mg | ORAL_TABLET | Freq: Three times a day (TID) | ORAL | 0 refills | Status: AC | PRN

## 2022-08-25 MED ORDER — MENTHOL 3 MG MT LOZG: 1.0000 | LOZENGE | OROMUCOSAL | Status: DC | PRN

## 2022-08-25 MED ORDER — FENTANYL CITRATE (PF) 250 MCG/5ML IJ SOLN
INTRAMUSCULAR | Status: DC | PRN
  Administered 2022-08-25: 50 ug via INTRAVENOUS
  Administered 2022-08-25 (×2): 100 ug via INTRAVENOUS

## 2022-08-25 MED ORDER — ONDANSETRON HCL 4 MG PO TABS: 4.0000 mg | ORAL_TABLET | Freq: Three times a day (TID) | ORAL | 0 refills | Status: DC | PRN

## 2022-08-25 MED ORDER — CEFAZOLIN SODIUM-DEXTROSE 1-4 GM/50ML-% IV SOLN
1.0000 g | Freq: Three times a day (TID) | INTRAVENOUS | Status: AC
  Administered 2022-08-25 (×2): 1 g via INTRAVENOUS
  Filled 2022-08-25 (×2): qty 50

## 2022-08-25 MED ORDER — TRANEXAMIC ACID-NACL 1000-0.7 MG/100ML-% IV SOLN
1000.0000 mg | INTRAVENOUS | Status: AC
  Administered 2022-08-25: 1000 mg via INTRAVENOUS
  Filled 2022-08-25: qty 100

## 2022-08-25 MED ORDER — MIDAZOLAM HCL 2 MG/2ML IJ SOLN
INTRAMUSCULAR | Status: DC | PRN
  Administered 2022-08-25: 2 mg via INTRAVENOUS

## 2022-08-25 MED ORDER — SUGAMMADEX SODIUM 200 MG/2ML IV SOLN
INTRAVENOUS | Status: DC | PRN
  Administered 2022-08-25: 129.8 mg via INTRAVENOUS

## 2022-08-25 MED ORDER — FENTANYL CITRATE (PF) 250 MCG/5ML IJ SOLN
INTRAMUSCULAR | Status: AC
  Filled 2022-08-25: qty 5

## 2022-08-25 MED ORDER — LACTATED RINGERS IV SOLN: INTRAVENOUS | Status: DC

## 2022-08-25 MED ORDER — SODIUM CHLORIDE 0.9% FLUSH: 3.0000 mL | INTRAVENOUS | Status: DC | PRN

## 2022-08-25 MED ORDER — ONDANSETRON HCL 4 MG/2ML IJ SOLN
INTRAMUSCULAR | Status: DC | PRN
  Administered 2022-08-25: 4 mg via INTRAVENOUS

## 2022-08-25 MED ORDER — PHENOL 1.4 % MT LIQD: 1.0000 | OROMUCOSAL | Status: DC | PRN

## 2022-08-25 MED ORDER — OXYCODONE-ACETAMINOPHEN 10-325 MG PO TABS: 1.0000 | ORAL_TABLET | Freq: Four times a day (QID) | ORAL | 0 refills | Status: AC | PRN

## 2022-08-25 MED ORDER — ACETAMINOPHEN 500 MG PO TABS
1000.0000 mg | ORAL_TABLET | Freq: Once | ORAL | Status: AC
  Administered 2022-08-25: 1000 mg via ORAL
  Filled 2022-08-25: qty 2

## 2022-08-25 MED ORDER — ESCITALOPRAM OXALATE 20 MG PO TABS
20.0000 mg | ORAL_TABLET | Freq: Every day | ORAL | Status: DC
  Administered 2022-08-26: 20 mg via ORAL
  Filled 2022-08-25 (×2): qty 1

## 2022-08-25 MED ORDER — ACETAMINOPHEN 500 MG PO TABS
ORAL_TABLET | ORAL | Status: AC
  Filled 2022-08-25: qty 1

## 2022-08-25 MED ORDER — THROMBIN 20000 UNITS EX SOLR
CUTANEOUS | Status: AC
  Filled 2022-08-25: qty 20000

## 2022-08-25 MED ORDER — BUPIVACAINE-EPINEPHRINE (PF) 0.25% -1:200000 IJ SOLN: INTRAMUSCULAR | Status: DC | PRN

## 2022-08-25 MED ORDER — ROCURONIUM BROMIDE 10 MG/ML (PF) SYRINGE
PREFILLED_SYRINGE | INTRAVENOUS | Status: DC | PRN
  Administered 2022-08-25 (×2): 50 mg via INTRAVENOUS

## 2022-08-25 MED ORDER — ONDANSETRON HCL 4 MG PO TABS: 4.0000 mg | ORAL_TABLET | Freq: Four times a day (QID) | ORAL | Status: DC | PRN

## 2022-08-25 MED ORDER — SURGIFLO WITH THROMBIN (HEMOSTATIC MATRIX KIT) OPTIME
TOPICAL | Status: DC | PRN
  Administered 2022-08-25: 1 via TOPICAL

## 2022-08-25 MED ORDER — HYDROMORPHONE HCL 1 MG/ML IJ SOLN
INTRAMUSCULAR | Status: AC
  Filled 2022-08-25: qty 1

## 2022-08-25 MED ORDER — HYDROMORPHONE HCL 1 MG/ML IJ SOLN
0.2500 mg | INTRAMUSCULAR | Status: DC | PRN
  Administered 2022-08-25 (×4): 0.5 mg via INTRAVENOUS

## 2022-08-25 MED ORDER — CHLORHEXIDINE GLUCONATE CLOTH 2 % EX PADS: 6.0000 | MEDICATED_PAD | Freq: Once | CUTANEOUS | Status: DC

## 2022-08-25 MED ORDER — ALPRAZOLAM 0.5 MG PO TABS: 0.5000 mg | ORAL_TABLET | Freq: Three times a day (TID) | ORAL | Status: DC | PRN

## 2022-08-25 MED ORDER — BETHANECHOL CHLORIDE 5 MG PO TABS
5.0000 mg | ORAL_TABLET | Freq: Three times a day (TID) | ORAL | Status: DC
  Administered 2022-08-25 – 2022-08-26 (×3): 5 mg via ORAL
  Filled 2022-08-25 (×5): qty 1

## 2022-08-25 MED ORDER — CHLORHEXIDINE GLUCONATE 0.12 % MT SOLN: 15.0000 mL | Freq: Once | OROMUCOSAL | Status: AC

## 2022-08-25 MED ORDER — CHLORHEXIDINE GLUCONATE 0.12 % MT SOLN
OROMUCOSAL | Status: AC
  Administered 2022-08-25: 15 mL via OROMUCOSAL
  Filled 2022-08-25: qty 15

## 2022-08-25 MED ORDER — STERILE WATER FOR IRRIGATION IR SOLN
Status: DC | PRN
  Administered 2022-08-25: 1000 mL

## 2022-08-25 MED ORDER — CEFAZOLIN SODIUM 1 G IJ SOLR
INTRAMUSCULAR | Status: AC
  Filled 2022-08-25: qty 20

## 2022-08-25 MED ORDER — OXYCODONE HCL 5 MG PO TABS
10.0000 mg | ORAL_TABLET | ORAL | Status: DC | PRN
  Administered 2022-08-25 (×2): 10 mg via ORAL
  Filled 2022-08-25 (×2): qty 2

## 2022-08-25 MED ORDER — ACETAMINOPHEN 650 MG RE SUPP: 650.0000 mg | RECTAL | Status: DC | PRN

## 2022-08-25 MED ORDER — HYDROMORPHONE HCL 1 MG/ML IJ SOLN
1.0000 mg | INTRAMUSCULAR | Status: AC | PRN
  Administered 2022-08-25: 1 mg via INTRAVENOUS
  Filled 2022-08-25: qty 1

## 2022-08-25 MED ORDER — METHOCARBAMOL 500 MG PO TABS
500.0000 mg | ORAL_TABLET | Freq: Four times a day (QID) | ORAL | Status: DC | PRN
  Administered 2022-08-25 – 2022-08-26 (×3): 500 mg via ORAL
  Filled 2022-08-25 (×3): qty 1

## 2022-08-25 MED ORDER — CEFAZOLIN SODIUM-DEXTROSE 2-4 GM/100ML-% IV SOLN
2.0000 g | INTRAVENOUS | Status: AC
  Administered 2022-08-25: 2 g via INTRAVENOUS
  Filled 2022-08-25: qty 100

## 2022-08-25 MED ORDER — MIDAZOLAM HCL 2 MG/2ML IJ SOLN
INTRAMUSCULAR | Status: AC
  Filled 2022-08-25: qty 2

## 2022-08-25 MED ORDER — DEXAMETHASONE SODIUM PHOSPHATE 10 MG/ML IJ SOLN
INTRAMUSCULAR | Status: DC | PRN
  Administered 2022-08-25: 10 mg via INTRAVENOUS

## 2022-08-25 MED ORDER — BUPIVACAINE-EPINEPHRINE (PF) 0.25% -1:200000 IJ SOLN
INTRAMUSCULAR | Status: AC
  Filled 2022-08-25: qty 30

## 2022-08-25 MED ORDER — METHOCARBAMOL 1000 MG/10ML IJ SOLN
500.0000 mg | Freq: Four times a day (QID) | INTRAVENOUS | Status: DC | PRN
  Filled 2022-08-25: qty 5

## 2022-08-25 MED ORDER — PROPOFOL 10 MG/ML IV BOLUS
INTRAVENOUS | Status: DC | PRN
  Administered 2022-08-25: 100 mg via INTRAVENOUS

## 2022-08-25 MED ORDER — AMITRIPTYLINE HCL 50 MG PO TABS
50.0000 mg | ORAL_TABLET | Freq: Every day | ORAL | Status: DC
  Administered 2022-08-25: 50 mg via ORAL
  Filled 2022-08-25 (×3): qty 1

## 2022-08-25 MED ORDER — POLYETHYLENE GLYCOL 3350 17 G PO PACK
17.0000 g | PACK | Freq: Every day | ORAL | Status: DC | PRN
  Administered 2022-08-26: 17 g via ORAL
  Filled 2022-08-25: qty 1

## 2022-08-25 MED ORDER — ENOXAPARIN SODIUM 40 MG/0.4ML IJ SOSY
40.0000 mg | PREFILLED_SYRINGE | INTRAMUSCULAR | Status: DC
  Administered 2022-08-26: 40 mg via SUBCUTANEOUS
  Filled 2022-08-25: qty 0.4

## 2022-08-25 MED ORDER — SODIUM CHLORIDE 0.9 % IV SOLN: 250.0000 mL | INTRAVENOUS | Status: DC

## 2022-08-25 MED ORDER — ORAL CARE MOUTH RINSE: 15.0000 mL | Freq: Once | OROMUCOSAL | Status: AC

## 2022-08-25 MED ORDER — GABAPENTIN 300 MG PO CAPS: 300.0000 mg | ORAL_CAPSULE | Freq: Three times a day (TID) | ORAL | Status: DC | PRN

## 2022-08-25 MED ORDER — ACETAMINOPHEN 325 MG PO TABS
650.0000 mg | ORAL_TABLET | ORAL | Status: DC | PRN
  Administered 2022-08-25 – 2022-08-26 (×3): 650 mg via ORAL
  Filled 2022-08-25 (×3): qty 2

## 2022-08-25 MED ORDER — AMISULPRIDE (ANTIEMETIC) 5 MG/2ML IV SOLN: 10.0000 mg | Freq: Once | INTRAVENOUS | Status: DC | PRN

## 2022-08-25 MED ORDER — SODIUM CHLORIDE 0.9% FLUSH
3.0000 mL | Freq: Two times a day (BID) | INTRAVENOUS | Status: DC
  Administered 2022-08-25: 3 mL via INTRAVENOUS

## 2022-08-25 MED ORDER — PROPOFOL 500 MG/50ML IV EMUL
INTRAVENOUS | Status: DC | PRN
  Administered 2022-08-25: 60 ug/kg/min via INTRAVENOUS

## 2022-08-25 MED ORDER — ONDANSETRON HCL 4 MG/2ML IJ SOLN: 4.0000 mg | Freq: Four times a day (QID) | INTRAMUSCULAR | Status: DC | PRN

## 2022-08-25 MED ORDER — LIDOCAINE 2% (20 MG/ML) 5 ML SYRINGE
INTRAMUSCULAR | Status: DC | PRN
  Administered 2022-08-25: 40 mg via INTRAVENOUS

## 2022-08-25 MED ORDER — THROMBIN 20000 UNITS EX KIT
PACK | CUTANEOUS | Status: DC | PRN
  Administered 2022-08-25: 20000 [IU] via TOPICAL

## 2022-08-25 MED ORDER — BUPIVACAINE-EPINEPHRINE (PF) 0.25% -1:200000 IJ SOLN
INTRAMUSCULAR | Status: DC | PRN
  Administered 2022-08-25: 30 mL

## 2022-08-25 MED ORDER — ENOXAPARIN SODIUM 40 MG/0.4ML IJ SOSY: 40.0000 mg | PREFILLED_SYRINGE | INTRAMUSCULAR | 0 refills | Status: DC

## 2022-08-25 MED ORDER — PHENYLEPHRINE HCL-NACL 20-0.9 MG/250ML-% IV SOLN
INTRAVENOUS | Status: DC | PRN
  Administered 2022-08-25: 50 ug/min via INTRAVENOUS

## 2022-08-25 SURGICAL SUPPLY — 94 items
AGENT HMST KT MTR STRL THRMB (HEMOSTASIS) ×2
APPLIER CLIP 11 MED OPEN (CLIP) ×2
APR CLP MED 11 20 MLT OPN (CLIP) ×2
BAG COUNTER SPONGE SURGICOUNT (BAG) ×4 IMPLANT
BAG SPNG CNTER NS LX DISP (BAG) ×2
BLADE CLIPPER SURG (BLADE) IMPLANT
BLADE SURG 10 STRL SS (BLADE) ×2 IMPLANT
BUR EGG ELITE 4.0 (BURR) IMPLANT
CABLE BIPOLOR RESECTION CORD (MISCELLANEOUS) ×2 IMPLANT
CLIP APPLIE 11 MED OPEN (CLIP) ×4 IMPLANT
CLIP LIGATING EXTRA MED SLVR (CLIP) IMPLANT
CLSR STERI-STRIP ANTIMIC 1/2X4 (GAUZE/BANDAGES/DRESSINGS) ×4 IMPLANT
COVER SURGICAL LIGHT HANDLE (MISCELLANEOUS) ×2 IMPLANT
DRAPE C-ARM 42X72 X-RAY (DRAPES) ×4 IMPLANT
DRAPE C-ARMOR (DRAPES) ×2 IMPLANT
DRAPE POUCH INSTRU U-SHP 10X18 (DRAPES) ×2 IMPLANT
DRAPE SURG 17X23 STRL (DRAPES) ×2 IMPLANT
DRAPE U-SHAPE 47X51 STRL (DRAPES) ×2 IMPLANT
DRSG OPSITE POSTOP 4X8 (GAUZE/BANDAGES/DRESSINGS) ×2 IMPLANT
DURAPREP 26ML APPLICATOR (WOUND CARE) ×2 IMPLANT
ELECT BLADE 4.0 EZ CLEAN MEGAD (MISCELLANEOUS) ×4
ELECT CAUTERY BLADE 6.4 (BLADE) ×2 IMPLANT
ELECT PENCIL ROCKER SW 15FT (MISCELLANEOUS) ×2 IMPLANT
ELECT REM PT RETURN 9FT ADLT (ELECTROSURGICAL) ×2
ELECTRODE BLDE 4.0 EZ CLN MEGD (MISCELLANEOUS) ×4 IMPLANT
ELECTRODE REM PT RTRN 9FT ADLT (ELECTROSURGICAL) ×2 IMPLANT
GAUZE 4X4 16PLY ~~LOC~~+RFID DBL (SPONGE) IMPLANT
GLOVE BIO SURGEON STRL SZ 6.5 (GLOVE) ×2 IMPLANT
GLOVE BIO SURGEON STRL SZ7.5 (GLOVE) ×2 IMPLANT
GLOVE BIOGEL PI IND STRL 6.5 (GLOVE) ×2 IMPLANT
GLOVE BIOGEL PI IND STRL 8 (GLOVE) ×2 IMPLANT
GLOVE BIOGEL PI IND STRL 8.5 (GLOVE) ×4 IMPLANT
GLOVE SS BIOGEL STRL SZ 8.5 (GLOVE) ×4 IMPLANT
GOWN STRL REUS W/ TWL LRG LVL3 (GOWN DISPOSABLE) ×2 IMPLANT
GOWN STRL REUS W/ TWL XL LVL3 (GOWN DISPOSABLE) ×2 IMPLANT
GOWN STRL REUS W/TWL 2XL LVL3 (GOWN DISPOSABLE) ×6 IMPLANT
GOWN STRL REUS W/TWL LRG LVL3 (GOWN DISPOSABLE) ×2
GOWN STRL REUS W/TWL XL LVL3 (GOWN DISPOSABLE) ×2
HEMOSTAT SNOW SURGICEL 2X4 (HEMOSTASIS) IMPLANT
INSERT FOGARTY 61MM (MISCELLANEOUS) IMPLANT
INSERT FOGARTY SM (MISCELLANEOUS) IMPLANT
INTERPLATE 39X14X12 (Plate) ×2 IMPLANT
KIT BASIN OR (CUSTOM PROCEDURE TRAY) ×2 IMPLANT
KIT TURNOVER KIT B (KITS) ×2 IMPLANT
NDL SPNL 18GX3.5 QUINCKE PK (NEEDLE) ×2 IMPLANT
NEEDLE SPNL 18GX3.5 QUINCKE PK (NEEDLE) ×2 IMPLANT
NS IRRIG 1000ML POUR BTL (IV SOLUTION) ×2 IMPLANT
PACK LAMINECTOMY ORTHO (CUSTOM PROCEDURE TRAY) ×2 IMPLANT
PACK UNIVERSAL I (CUSTOM PROCEDURE TRAY) ×2 IMPLANT
PAD ARMBOARD 7.5X6 YLW CONV (MISCELLANEOUS) ×8 IMPLANT
PEEK SPACER INTERPLAT 35X14X12 (Peek) IMPLANT
PLATE SPINAL INTERPLT 39X14X12 (Plate) IMPLANT
PUTTY BONE DBX 5CC MIX (Putty) IMPLANT
SCREW BONE STANDARD (Screw) ×6 IMPLANT
SCREW BONE STD (Screw) IMPLANT
SCREW SPINAL STD (Orthopedic Implant) IMPLANT
SPONGE INTESTINAL PEANUT (DISPOSABLE) ×8 IMPLANT
SPONGE SURGIFOAM ABS GEL 100 (HEMOSTASIS) IMPLANT
SPONGE T-LAP 18X18 ~~LOC~~+RFID (SPONGE) ×2 IMPLANT
STAPLER VISISTAT 35W (STAPLE) ×2 IMPLANT
SURGIFLO W/THROMBIN 8M KIT (HEMOSTASIS) ×2 IMPLANT
SUT BONE WAX W31G (SUTURE) ×2 IMPLANT
SUT MNCRL AB 3-0 PS2 18 (SUTURE) IMPLANT
SUT MNCRL AB 3-0 PS2 27 (SUTURE) ×2 IMPLANT
SUT MNCRL+ AB 3-0 CT1 36 (SUTURE) ×2 IMPLANT
SUT MONOCRYL AB 3-0 CT1 36IN (SUTURE) ×2
SUT PDS AB 1 CTX 36 (SUTURE) ×2 IMPLANT
SUT PROLENE 4 0 RB 1 (SUTURE)
SUT PROLENE 4-0 RB1 .5 CRCL 36 (SUTURE) IMPLANT
SUT PROLENE 5 0 C 1 24 (SUTURE) ×2 IMPLANT
SUT PROLENE 5 0 CC1 (SUTURE) IMPLANT
SUT PROLENE 6 0 C 1 30 (SUTURE) IMPLANT
SUT PROLENE 6 0 CC (SUTURE) IMPLANT
SUT SILK 0 TIES 10X30 (SUTURE) IMPLANT
SUT SILK 2 0 TIES 10X30 (SUTURE) ×4 IMPLANT
SUT SILK 2 0SH CR/8 30 (SUTURE) IMPLANT
SUT SILK 3 0 TIES 10X30 (SUTURE) ×2 IMPLANT
SUT SILK 3 0 TIES 17X18 (SUTURE)
SUT SILK 3 0SH CR/8 30 (SUTURE) IMPLANT
SUT SILK 3-0 18XBRD TIE BLK (SUTURE) IMPLANT
SUT VIC AB 0 CT1 27 (SUTURE) ×2
SUT VIC AB 0 CT1 27XBRD ANBCTR (SUTURE) IMPLANT
SUT VIC AB 1 CT1 18XCR BRD 8 (SUTURE) IMPLANT
SUT VIC AB 1 CT1 27 (SUTURE)
SUT VIC AB 1 CT1 27XBRD ANBCTR (SUTURE) ×4 IMPLANT
SUT VIC AB 1 CT1 8-18 (SUTURE) ×2
SUT VIC AB 2-0 CT1 18 (SUTURE) ×2 IMPLANT
SYR 10ML LL (SYRINGE) IMPLANT
SYR BULB IRRIG 60ML STRL (SYRINGE) ×2 IMPLANT
TOWEL GREEN STERILE (TOWEL DISPOSABLE) ×4 IMPLANT
TOWEL GREEN STERILE FF (TOWEL DISPOSABLE) ×2 IMPLANT
TRAY FOLEY MTR SLVR 14FR STAT (SET/KITS/TRAYS/PACK) IMPLANT
TRAY FOLEY MTR SLVR 16FR STAT (SET/KITS/TRAYS/PACK) ×2 IMPLANT
WATER STERILE IRR 1000ML POUR (IV SOLUTION) ×2 IMPLANT

## 2022-08-25 NOTE — H&P (Signed)
History and Physical Interval Note:  08/25/2022 7:26 AM  [name]  has presented today for surgery, with the diagnosis of Post laminectomy syndrome with degenerative disc disease and radiculopathy.  The various methods of treatment have been discussed with the patient and family. After consideration of risks, benefits and other options for treatment, the patient has consented to  Procedure(s) with comments: ANTERIOR LUMBAR FUSION 1 LEVEL (ALIF L4-5 WITH ILIAC CREST BONE GRAFT) (N/A) - 4 HRS DR. Thuan Tippett TO DO APPROACH 3 C-BED HARVEST ILIAC BONE GRAFT (N/A) ABDOMINAL EXPOSURE (N/A) as a surgical intervention.  The patient's history has been reviewed, patient examined, no change in status, stable for surgery.  I have reviewed the patient's chart and labs.  Questions were answered to the patient's satisfaction.    Anterior spine exposure for L4-L5 ALIF.  Marty Heck     Patient name: [name]        MRN: [id number]        DOB: Jun 24, 1961          Sex: female   REASON FOR CONSULT: Abdominal exposure for L4-L5 ALIF   HPI: [name] is a 61 y.o. female, with history of breast cancer as well as chronic lower back pain that presents for evaluation of anterior abdominal exposure for L4-L5 ALIF.  Patient describes about 6 months of severe lower back pain with left leg radiculopathy.  She has been evaluated by Dr. Rolena Infante who feels that most of her pathology is related to L4-L5 pain generator.  He has asked vascular surgery to assist with anterior lumbar spine exposure at L4-L5.  She states she has had previous C-section and tubal ligation.       Past Medical History:  Diagnosis Date   Anemia     Arthritis     Breast cancer ([place])      right breast cancer 5 yrs ago   Cancer [facility])       right breast cancer   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    APPENDIX IN [facility]Q   Depression     Dysrhythmia      hx palpitations-too much caffiene   Headache     Scoliosis             Past  Surgical History:  Procedure Laterality Date   ANKLE SURGERY        left ankle cyst   BACK SURGERY   2000    lumbar disc surgery   BIOPSY   01/07/2020    Procedure: BIOPSY;  Surgeon: Danie Binder, MD;  [facility];  Service: Endoscopy;;   BREAST EXCISIONAL BIOPSY Left      benign   BREAST SURGERY        left partail mastectomy-cyst   CERVICAL [place]    anterior and a posterier cerv fusionx2   CESAREAN SECTION        x2   COLONOSCOPY       COLONOSCOPY WITH PROPOFOL N/A 06/26/2018    Procedure: COLONOSCOPY WITH PROPOFOL;  Surgeon: Danie Binder, MD;  [facility]: [facility];  Service: Endoscopy;  Laterality: N/A;  11:00am   DILATION AND CURETTAGE OF UTERUS       DILITATION & CURRETTAGE/HYSTROSCOPY WITH THERMACHOICE ABLATION   07/10/2012    Procedure: DILATATION & CURETTAGE/HYSTEROSCOPY WITH THERMACHOICE ABLATION;  Surgeon: [name], MD;  Location: [facility];  Service: Gynecology;  Laterality: N/A;  D5 42m in, 13 ml out, temp 87degree  celcius, total therapy time 41mn 16sec   ERCP       ESOPHAGOGASTRODUODENOSCOPY (EGD) WITH PROPOFOL N/A 01/07/2020    Procedure: ESOPHAGOGASTRODUODENOSCOPY (EGD) WITH PROPOFOL;  Surgeon: FDanie Binder MD;  [facility]: [facility];  Service: Endoscopy;  Laterality: N/A;  2:00pm   POLYPECTOMY   06/26/2018    Procedure: POLYPECTOMY;  Surgeon: FDanie Binder MD;  [facility]: [facility];  Service: Endoscopy;;  colon   TUBAL LIGATION        with last c-section           Family History  Problem Relation Age of Onset   Hypertension Mother     Colon cancer Neg Hx     Gastric cancer Neg Hx     Esophageal cancer Neg Hx        SOCIAL HISTORY: Social History         Socioeconomic History   Marital status: Married      Spouse name: Not on file   Number of children: 2   Years of education: Not on file   Highest education level: Not on file  Occupational History   Not on file  Tobacco Use   Smoking status: Never    Smokeless tobacco: Never  Vaping Use   Vaping Use: Never used  Substance and Sexual Activity   Alcohol use: No   Drug use: No   Sexual activity: Yes      Birth control/protection: Surgical, Post-menopausal      Comment: tubal & ablation  Other Topics Concern   Not on file  Social History Narrative   Not on file    Social Determinants of Health        Financial Resource Strain: Unknown (06/21/2021)    Overall Financial Resource Strain (CARDIA)     Difficulty of Paying Living Expenses: Patient refused  Food Insecurity: No Food Insecurity (06/21/2021)    Hunger Vital Sign     Worried About Running Out of Food in the Last Year: Never true     Ran Out of Food in the Last Year: Never true  Transportation Needs: Unknown (06/21/2021)    PRAPARE - Transportation     Lack of Transportation (Medical): Patient refused     Lack of Transportation (Non-Medical): Patient refused  Physical Activity: Unknown (06/21/2021)    Exercise Vital Sign     Days of Exercise per Week: Patient refused     Minutes of Exercise per Session: Patient refused  Stress: Unknown (06/21/2021)    [place]    Feeling of Stress : Patient refused  Social Connections: Unknown (06/21/2021)    Social Connection and Isolation Panel [NHANES]     Frequency of Communication with Friends and Family: Patient refused     Frequency of Social Gatherings with Friends and Family: Patient refused     Attends Religious Services: Patient refused     Active Member of Clubs or Organizations: Patient refused     Attends CArchivistMeetings: Patient refused     Marital Status: Patient refused  Intimate Partner Violence: Unknown (06/21/2021)    Humiliation, Afraid, Rape, and Kick questionnaire     Fear of Current or Ex-Partner: Patient refused     Emotionally Abused: Patient refused     Physically Abused: Patient refused     Sexually Abused: Patient  refused      No Known Allergies         Current  Outpatient Medications  Medication Sig Dispense Refill   ALPRAZolam (XANAX) 0.5 MG tablet Take 0.5 mg by mouth as needed for anxiety.        amitriptyline (ELAVIL) 50 MG tablet Take by mouth.       benzonatate (TESSALON) 200 MG capsule         bethanechol (URECHOLINE) 5 MG tablet Take 5 mg by mouth 3 (three) times daily.       buPROPion (WELLBUTRIN XL) 150 MG 24 hr tablet Take 150 mg by mouth daily.       calcium carbonate (OSCAL) 1500 (600 Ca) MG TABS tablet Take 600 mg of elemental calcium by mouth daily with breakfast.        celecoxib (CELEBREX) 200 MG capsule         Cholecalciferol (D 1000) 25 MCG (1000 UT) capsule Take by mouth.       colchicine 0.6 MG tablet         cyclobenzaprine (FLEXERIL) 10 MG tablet Take 10 mg by mouth daily as needed for muscle spasms.        dexAMETHasone 1.5 MG (21) TBPK         dicyclomine (BENTYL) 10 MG capsule         diphenhydrAMINE (BENADRYL) 25 mg capsule Take by mouth.       escitalopram (LEXAPRO) 20 MG tablet         Ferrous Sulfate (IRON PO) Take by mouth.       gabapentin (NEURONTIN) 300 MG capsule TAKE 1 CAPSULES BY MOUTH THREE TIMES DAILY.       guaiFENesin-codeine (ROBITUSSIN AC) 100-10 MG/5ML syrup         HYDROcodone-acetaminophen (NORCO) 10-325 MG tablet Take 1 tablet by mouth every 6 (six) hours as needed for moderate pain.        IBU 800 MG tablet Take 800 mg by mouth every 8 (eight) hours as needed for moderate pain.       iron polysaccharides (NIFEREX) 150 MG capsule Take by mouth.       lidocaine (XYLOCAINE) 2 % solution         meclizine (ANTIVERT) 25 MG tablet         methocarbamol (ROBAXIN) 750 MG tablet         Multiple Vitamins-Minerals (MULTIVITAMIN WOMEN 50+ PO) Take 1 tablet by mouth daily.       Omeprazole 20 MG TBDD         ondansetron (ZOFRAN-ODT) 8 MG disintegrating tablet         oxyCODONE-acetaminophen (PERCOCET) 10-325 MG tablet TAKE (1) TABLET BY MOUTH EVERY SIX  HOURS AS NEEDED.       pregabalin (LYRICA) 75 MG capsule Take 75 mg by mouth 2 (two) times daily.       sertraline (ZOLOFT) 50 MG tablet Take 1 tablet (50 mg total) by mouth daily. 30 tablet 0   triamcinolone cream (KENALOG) 0.1 % Apply 1 Application topically 2 (two) times daily. 60 g 0   venlafaxine (EFFEXOR) 37.5 MG tablet         Venlafaxine HCl 225 MG TB24         venlafaxine XR (EFFEXOR-XR) 150 MG 24 hr capsule Take by mouth.       vitamin B-12 (CYANOCOBALAMIN) 500 MCG tablet Take 500 mcg by mouth.       amoxicillin (AMOXIL) 500 MG capsule  (Patient not taking: Reported on 08/02/2022)       amoxicillin-clavulanate (AUGMENTIN) 875-125 MG tablet Take 1  tablet by mouth every 12 (twelve) hours. (Patient not taking: Reported on 08/02/2022) 14 tablet 0   ciprofloxacin (CIPRO) 500 MG tablet  (Patient not taking: Reported on 08/02/2022)       gabapentin (NEURONTIN) 100 MG capsule Take 100 mg by mouth 3 (three) times daily. (Patient not taking: Reported on 08/02/2022)       traMADol (ULTRAM) 50 MG tablet  (Patient not taking: Reported on 08/02/2022)        No current facility-administered medications for this visit.      REVIEW OF SYSTEMS:  '[X]'$  denotes positive finding, '[ ]'$  denotes negative finding Cardiac   Comments:  Chest pain or chest pressure:      Shortness of breath upon exertion:      Short of breath when lying flat:      Irregular heart rhythm:             Vascular      Pain in calf, thigh, or hip brought on by ambulation:      Pain in feet at night that wakes you up from your sleep:       Blood clot in your veins:      Leg swelling:              Pulmonary      Oxygen at home:      Productive cough:       Wheezing:              Neurologic      Sudden weakness in arms or legs:       Sudden numbness in arms or legs:       Sudden onset of difficulty speaking or slurred speech:      Temporary loss of vision in one eye:       Problems with dizziness:               Gastrointestinal      Blood in stool:       Vomited blood:              Genitourinary      Burning when urinating:       Blood in urine:             Psychiatric      Major depression:              Hematologic      Bleeding problems:      Problems with blood clotting too easily:             Skin      Rashes or ulcers:             Constitutional      Fever or chills:          PHYSICAL EXAM:    Vitals:    08/02/22 1611  BP: 125/87  Pulse: 94  Resp: 16  Temp: (!) 97.2 F (36.2 C)  TempSrc: Temporal  SpO2: 98%  Weight: 146 lb (66.2 kg)  Height: '5\' 7"'$  (1.702 m)      GENERAL: The patient is a well-nourished female, in no acute distress. The vital signs are documented above. CARDIAC: There is a regular rate and rhythm.  VASCULAR:  Palpable DP pulses bilaterally PULMONARY: No respiratory distress. ABDOMEN: Soft and non-tender. MUSCULOSKELETAL: There are no major deformities or cyanosis. NEUROLOGIC: No focal weakness or paresthesias are detected. PSYCHIATRIC: The patient has a normal affect.   DATA:  MRI reviewed from 10/07/21      Assessment/Plan:   61 year old female with chronic lower back pain with left leg radiculopathy that vascular surgery has been asked to assist with anterior spine exposure at L4-L5 for L4-L5 ALIF.  I have reviewed her MRI and discussed that she should be an appropriate candidate for anterior exposure.  I discussed paramedian incision over the left rectus muscle and mobilizing the rectus muscles and entering into the retroperitoneum and mobilizing peritoneum and left ureter across midline.  Discussed mobilizing left iliac artery and vein.  Discussed risk of injury to the above structures.  Look forward to assisting Dr. Rolena Infante.  All questions answered.  She is scheduled for 08/25/2022.     Marty Heck, MD [facility] Office: [phone]

## 2022-08-25 NOTE — H&P (Signed)
History:  [name] returns today for follow-up and to discuss definitive treatment for her chronic debilitating back buttock and radicular left leg pain. Patient continues to have weakness in the L4 and L5 dermatome on the left side with no symptoms on the right. She states that for a week and a half following the L4 selective nerve root block her quality of life was markedly improved and she had no back buttock or radicular leg pain.  Patient has had a prior lumbar decompression at L4-5 but unfortunately has progressive degenerative disc disease and postlaminectomy syndrome.  As result we will move forward with an anterior lumbar to body fusion L4-5.  We did discuss iliac crest bone graft use which she was consented for.  However she states that because of the increased morbidity associated with the iliac crest she would like to move forward with allograft only which I think is fine.  Past Medical History:  Diagnosis Date   Anemia    Arthritis    Breast cancer ([place])    right breast cancer 5 yrs ago   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   APPENDIX IN [facility]   Depression    Dysrhythmia    hx palpitations-too much caffiene   Headache    patient denies this dx   Scoliosis     No Known Allergies  No current facility-administered medications on file prior to encounter.   Current Outpatient Medications on File Prior to Encounter  Medication Sig Dispense Refill   ALPRAZolam (XANAX) 0.5 MG tablet Take 0.5 mg by mouth 3 (three) times daily as needed for anxiety.     bethanechol (URECHOLINE) 5 MG tablet Take 5 mg by mouth 3 (three) times daily.     cyclobenzaprine (FLEXERIL) 10 MG tablet Take 10 mg by mouth 3 (three) times daily as needed for muscle spasms.     HYDROcodone-acetaminophen (NORCO) 10-325 MG tablet Take 1 tablet by mouth every 6 (six) hours as needed for moderate pain.      Multiple Vitamins-Minerals (MULTIVITAMIN WOMEN 50+ PO) Take 1 tablet by mouth daily.      amoxicillin-clavulanate (AUGMENTIN) 875-125 MG tablet Take 1 tablet by mouth every 12 (twelve) hours. (Patient not taking: Reported on 08/12/2022) 14 tablet 0   sertraline (ZOLOFT) 50 MG tablet Take 1 tablet (50 mg total) by mouth daily. (Patient not taking: Reported on 08/12/2022) 30 tablet 0   triamcinolone cream (KENALOG) 0.1 % Apply 1 Application topically 2 (two) times daily. (Patient not taking: Reported on 08/12/2022) 60 g 0    Physical Exam: Vitals:   08/25/22 0600  BP: 134/81  Pulse: 94  Resp: 16  Temp: 98.4 F (36.9 C)  SpO2: 99%   Body mass index is 22.4 kg/m. Clinical exam: [name] is a pleasant individual, who appears younger than their stated age.  She is alert and orientated 3.  No shortness of breath, chest pain.  Abdomen is soft and non-tender, negative loss of bowel and bladder control, no rebound tenderness.  Negative: skin lesions abrasions contusions  Peripheral pulses: 2+ dorsalis pedis/posterior tibialis pulses bilaterally. LE compartments are: Soft and nontender.  Gait pattern: Altered gait pattern due to radicular left leg pain  Assistive devices: Cane  Neuro: 4+/5 left EHL/tibialis anterior/gastrocnemius strength. 5/5 motor strength in the left hip flexor, quad. 5/5 motor strength in the right lower extremity. Positive numbness and dysesthesias primarily in the left L5 dermatome. Negative straight leg raise test, no clonus, negative Babinski test. Symmetrical  1+ deep tendon reflexes at the knee and Achilles.  Musculoskeletal: Moderate to severe low back pain radiating into the left lower extremity. No hip, knee, ankle pain with isolated joint range of motion. No SI joint pain.  Imaging: X-rays of the lumbar spine demonstrate no abnormal motion on flexion-extension views. She does have mild degenerative changes L4-5 and L5-S1 but no spondylolisthesis or scoliosis.  Lumbar MRI: completed on 10/06/2021 : left laminotomy L4-5 with improved lateral recess  decompression. No significant neural compression. Mild left neural foraminal narrowing. L5-S1: Right posterior lateral hard disc osteophyte causing some contact with the traversing S1 nerve root. No significant neuroforaminal narrowing. Positive degenerative disc disease changes. L3-4: Mild left lateral recess stenosis without evidence of nerve root impingement. No fracture or abnormal marrow signal change noted.  Lumbar MRI: completed on 05/04/2022. Mild broad upper levoscoliosis unchanged. Improvement in progression at L4-5. Moderate to severe left foraminal stenosis has progressed. There is a right posterior lateral disc protrusion at L5-S1 which is similar in size.  Patient noted 1-1/2 weeks of significant improvement status post left L4 selective nerve root block.   A/P: Summary: [name] returns today for follow-up and to discuss definitive treatment for her chronic debilitating back buttock and radicular left leg pain. Patient continues to have weakness in the L4 and L5 dermatome on the left side with no symptoms on the right. She states that for a week and a half following the L4 selective nerve root block her quality of life was markedly improved and she had no back buttock or radicular leg pain.  Based on her clinical symptoms I believe the primary pain generator is the L4-5 level. She clearly states that while the leg pain is problematic her main issue is the severe back lateral buttock and lateral thigh pain. Her back pain is 75% of her disability at this point time. Given this I think the best course of action is to move forward with an anterior lumbar interbody fusion at L4-5. This would allow [place] to remove the entire disc and restore the disc height. This would provide for indirect foraminal decompression to address the lateral recess and foraminal stenosis. I have gone over the risks, benefits, and alternatives in great detail with patient and her husband. Including the fact that in the future  the L5-S1 level may require surgery especially since there are degenerative changes and a right-sided posterior lateral disc protrusion. At this point we have elected not to incorporate this into the surgery since she does not have any right radicular leg pain and I do not think a multilevel fusion at this point is in her best interest.  We did discuss moving forward with iliac crest bone graft at the time of her last office visit.  However at this point she would like to just use DBX mix (allograft).  Risks and benefits of surgery were discussed with the patient. These include: Infection, bleeding, death, stroke, paralysis, ongoing or worse pain, need for additional surgery, nonunion, leak of spinal fluid, adjacent segment degeneration requiring additional fusion surgery, need for posterior decompression and/or fusion. Bleeding from major vessels, and blood clots (deep venous thrombosis)requiring additional treatment, loss in bowel and bladder control, injury to bladder, ureters, and other major abdominal organs.

## 2022-08-25 NOTE — Discharge Instructions (Signed)
  Spinal Fusion, Adult, Care After This sheet gives you information about how to care for yourself after your procedure. Your doctor may also give you more specific instructions. If you have problems or questions, contact your doctor. Follow these instructions at home: Medicines Take over-the-counter and prescription medicines only as told by your doctor. These include any medicines for pain or blood-thinning medicines (anticoagulants). If you were prescribed an antibiotic medicine, take it as told by your doctor. Do not stop taking the antibiotic even if you start to feel better. Do not drive for 24 hours if you were given a medicine to help you relax (sedative) during your procedure. Do not drive or use heavy machinery while taking prescription pain medicine. If you have a brace: Wear the brace as told by your doctor. Take it off only as told by your doctor. Keep the brace clean. Managing pain, stiffness, and swelling If directed, put ice on the surgery area: If you have a removable brace, take it off as told by your doctor. Put ice in a plastic bag. Place a towel between your skin and the bag. Leave the ice on for 20 minutes, 2-3 times a day. Surgery cut care    Follow instructions from your doctor about how to take care of your cut from surgery (incision). Make sure you: Wash your hands with soap and water before you change your bandage (dressing). If you cannot use soap and water, use hand sanitizer. Change your bandage as told by your doctor. Leave stitches (sutures), skin glue, or skin tape (adhesive) strips in place. They may need to stay in place for 2 weeks or longer. If tape strips get loose and curl up, you may trim the loose edges. Do not remove tape strips completely unless your doctor says it is okay. Keep your cut from surgery clean and dry. Do not take baths, swim, or use a hot tub until your doctor says it is okay. Ask your doctor if you can take showers. You may only  be allowed to take sponge baths. Every day, check your cut from surgery and the area around it for: More redness, swelling, or pain. Fluid or blood. Warmth. Pus or a bad smell. If you have a drain tube, follow instructions from your doctor about caring for it. Do not take out the drain tube or any bandages unless your doctor says it is okay. Physical activity Rest and protect your back as much as possible. Follow instructions from your doctor about how to move. Use good posture to help your spine heal. Do not lift anything that is heavier than 8 lb (3.6 kg), or the limit that you are told, until your doctor says that it is safe. Do not twist or bend at the waist until your doctor says it is okay. It is best if you: Do not make pushing and pulling motions. Do not sit or lie down in the same position for a long time. Do not raise your hands or arms above your head. Return to your normal activities as told by your doctor. Ask your doctor what activities are safe for you. Rest and protect your back as much as you can. Do not start to exercise until your doctor says it is okay. Ask your doctor what kinds of exercise you can do to make your back stronger. Ok to shower in 5 days.  Do not take a bath or submerge the wound General instructions To prevent blood clots and lessen swelling   in your legs: Wear compression stockings as told. Walk one or more times every few hours as told by your doctor. Do not use any products that contain nicotine or tobacco, such as cigarettes and e-cigarettes. These can delay bone healing. If you need help quitting, ask your doctor. To prevent or treat constipation while you are taking prescription pain medicine, your doctor may suggest that you: Drink enough fluid to keep your pee (urine) pale yellow. Take over-the-counter or prescription medicines. Eat foods that are high in fiber. These include fresh fruits and vegetables, whole grains, and beans. Limit foods that  are high in fat and processed sugars, such as fried and sweet foods. Keep all follow-up visits as told by your doctor. This is important. Contact a doctor if: Your pain gets worse. Your medicine does not help your pain. Your legs or feet get painful or swollen. Your cut from surgery is more red, swollen, or painful. Your cut from surgery feels warm to the touch. You have: Fluid or blood coming from your cut from surgery. Pus or a bad smell coming from your cut from surgery. A fever. Weakness or loss of feeling (numbness) in your legs that is new or getting worse. Trouble controlling when you pee (urinate) or poop (have a bowel movement). You feel sick to your stomach (nauseous). You throw up (vomit). Get help right away if: Your pain is very bad. You have chest pain. You have trouble breathing. You start to have a cough. These symptoms may be an emergency. Do not wait to see if the symptoms will go away. Get medical help right away. Call your local emergency services (911 in the [place] Do not drive yourself to the hospital. Summary After the procedure, it is common to have pain in your back and pain by your surgery cut(s). Icing and pain medicines may help to control the pain. Follow directions from your doctor. Rest and protect your back as much as possible. Do not twist or bend at the waist. Get up and walk one or more times every few hours as told by your doctor. This information is not intended to replace advice given to you by your health care provider. Make sure you discuss any questions you have with your health care provider.  -signs and symptoms of a blood clot such as chest pain; shortness of breath; pain, swelling, or warmth in the leg -signs and symptoms of a stroke such as changes in vision; confusion; trouble speaking or understanding; severe headaches; sudden numbness or weakness of the face, arm or leg; trouble walking; dizziness; loss of coordination Side effects that  usually do not require medical attention (report to your doctor or health care professional if they continue or are bothersome): -hair loss -pain, redness, or irritation at site where injected This list may not describe all possible side effects. Call your doctor for medical advice about side effects. You may report side effects to FDA at 1-800-FDA-1088. Where should I keep my medicine? Keep out of the reach of children. Store at room temperature between 15 and 30 degrees C (59 and 86 degrees F). Do not freeze. If your injections have been specially prepared, you may need to store them in the refrigerator. Ask your pharmacist. Throw away any unused medicine after the expiration date. NOTE: This sheet is a summary. It may not cover all possible information. If you have questions about this medicine, talk to your doctor, pharmacist, or health care provider.    Enoxaparin injection  What is this medicine? ENOXAPARIN (ee nox a PA rin) is used after knee, hip, or abdominal surgeries to prevent blood clotting. It is also used to treat existing blood clots in the lungs or in the veins. This medicine may be used for other purposes; ask your health care provider or pharmacist if you have questions. COMMON BRAND NAME(S): Lovenox What should I tell my health care provider before I take this medicine? They need to know if you have any of these conditions: bleeding disorders, hemorrhage, or hemophilia infection of the heart or heart valves kidney or liver disease previous stroke prosthetic heart valve recent surgery or delivery of a baby ulcer in the stomach or intestine, diverticulitis, or other bowel disease an unusual or allergic reaction to enoxaparin, heparin, pork or pork products, other medicines, foods, dyes, or preservatives pregnant or trying to get pregnant breast-feeding How should I use this medicine? This medicine is for injection under the skin. It is usually given by a health-care  professional. You or a family member may be trained on how to give the injections. If you are to give yourself injections, make sure you understand how to use the syringe, measure the dose if necessary, and give the injection. To avoid bruising, do not rub the site where this medicine has been injected. Do not take your medicine more often than directed. Do not stop taking except on the advice of your doctor or health care professional. Make sure you receive a puncture-resistant container to dispose of the needles and syringes once you have finished with them. Do not reuse these items. Return the container to your doctor or health care professional for proper disposal. Talk to your pediatrician regarding the use of this medicine in children. Special care may be needed. Overdosage: If you think you have taken too much of this medicine contact a poison control center or emergency room at once. NOTE: This medicine is only for you. Do not share this medicine with others. What if I miss a dose? If you miss a dose, take it as soon as you can. If it is almost time for your next dose, take only that dose. Do not take double or extra doses. What may interact with this medicine? aspirin and aspirin-like medicines certain medicines that treat or prevent blood clots dipyridamole NSAIDs, medicines for pain and inflammation, like ibuprofen or naproxen This list may not describe all possible interactions. Give your health care provider a list of all the medicines, herbs, non-prescription drugs, or dietary supplements you use. Also tell them if you smoke, drink alcohol, or use illegal drugs. Some items may interact with your medicine. What should I watch for while using this medicine? Visit your healthcare professional for regular checks on your progress. You may need blood work done while you are taking this medicine. Your condition will be monitored carefully while you are receiving this medicine. It is important  not to miss any appointments. If you are going to need surgery or other procedure, tell your healthcare professional that you are using this medicine. Using this medicine for a long time may weaken your bones and increase the risk of bone fractures. Avoid sports and activities that might cause injury while you are using this medicine. Severe falls or injuries can cause unseen bleeding. Be careful when using sharp tools or knives. Consider using an Copy. Take special care brushing or flossing your teeth. Report any injuries, bruising, or red spots on the skin to your healthcare professional.  Wear a medical ID bracelet or chain. Carry a card that describes your disease and details of your medicine and dosage times. What side effects may I notice from receiving this medicine? Side effects that you should report to your doctor or health care professional as soon as possible: allergic reactions like skin rash, itching or hives, swelling of the face, lips, or tongue bone pain signs and symptoms of bleeding such as bloody or black, tarry stools; red or dark-brown urine; spitting up blood or brown material that looks like coffee grounds; red spots on the skin; unusual bruising or bleeding from the eye, gums, or nose signs and symptoms of a blood clot such as chest pain; shortness of breath; pain, swelling, or warmth in the leg signs and symptoms of a stroke such as changes in vision; confusion; trouble speaking or understanding; severe headaches; sudden numbness or weakness of the face, arm or leg; trouble walking; dizziness; loss of coordination Side effects that usually do not require medical attention (report to your doctor or health care professional if they continue or are bothersome): hair loss pain, redness, or irritation at site where injected This list may not describe all possible side effects. Call your doctor for medical advice about side effects. You may report side effects to FDA at  1-800-FDA-1088. Where should I keep my medicine? Keep out of the reach of children. Store at room temperature between 15 and 30 degrees C (59 and 86 degrees F). Do not freeze. If your injections have been specially prepared, you may need to store them in the refrigerator. Ask your pharmacist. Throw away any unused medicine after the expiration date. NOTE: This sheet is a summary. It may not cover all possible information. If you have questions about this medicine, talk to your doctor, pharmacist, or health care provider.

## 2022-08-25 NOTE — Anesthesia Preprocedure Evaluation (Signed)
Anesthesia Evaluation  Patient identified by MRN, date of birth, ID band Patient awake    Reviewed: Allergy & Precautions, NPO status , Patient's Chart, lab work & pertinent test results  Airway Mallampati: II  TM Distance: >3 FB Neck ROM: Full    Dental  (+) Dental Advisory Given   Pulmonary neg pulmonary ROS   breath sounds clear to auscultation       Cardiovascular negative cardio ROS  Rhythm:Regular Rate:Normal     Neuro/Psych  Headaches  Neuromuscular disease    GI/Hepatic negative GI ROS, Neg liver ROS,,,  Endo/Other  negative endocrine ROS    Renal/GU negative Renal ROS     Musculoskeletal   Abdominal   Peds  Hematology  (+) Blood dyscrasia, anemia   Anesthesia Other Findings   Reproductive/Obstetrics                              Lab Results  Component Value Date   WBC 3.6 (L) 08/05/2022   HGB 11.6 (L) 08/05/2022   HCT 34.3 (L) 08/05/2022   MCV 94.5 08/05/2022   PLT 247 08/05/2022   Lab Results  Component Value Date   CREATININE 0.62 08/05/2022   BUN 14 08/05/2022   NA 139 08/05/2022   K 3.3 (L) 08/05/2022   CL 103 08/05/2022   CO2 23 08/05/2022    Anesthesia Physical Anesthesia Plan  ASA: 2  Anesthesia Plan: General   Post-op Pain Management: Tylenol PO (pre-op)*, Ketamine IV* and Toradol IV (intra-op)*   Induction: Intravenous  PONV Risk Score and Plan: 3 and Dexamethasone, Ondansetron, Midazolam and Treatment may vary due to age or medical condition  Airway Management Planned: Oral ETT  Additional Equipment: Arterial line  Intra-op Plan:   Post-operative Plan: Extubation in OR  Informed Consent: I have reviewed the patients History and Physical, chart, labs and discussed the procedure including the risks, benefits and alternatives for the proposed anesthesia with the patient or authorized representative who has indicated his/her understanding and  acceptance.     Dental advisory given  Plan Discussed with: CRNA  Anesthesia Plan Comments:          Anesthesia Quick Evaluation

## 2022-08-25 NOTE — Anesthesia Procedure Notes (Addendum)
Arterial Line Insertion Start/End12/21/2023 7:15 AM, 08/25/2022 7:25 AM Performed by: Suzette Battiest, MD, anesthesiologist  Patient location: Pre-op. Preanesthetic checklist: patient identified, IV checked, site marked, risks and benefits discussed, surgical consent, monitors and equipment checked, pre-op evaluation, timeout performed and anesthesia consent Lidocaine 1% used for infiltration Right, radial was placed Catheter size: 20 G Hand hygiene performed  and maximum sterile barriers used   Attempts: 2 Procedure performed using ultrasound guided technique. Ultrasound Notes:anatomy identified, needle tip was noted to be adjacent to the nerve/plexus identified, no ultrasound evidence of intravascular and/or intraneural injection and image(s) printed for medical record Following insertion, dressing applied and Biopatch. Post procedure assessment: normal and unchanged  Post procedure complications: second provider assisted. Patient tolerated the procedure well with no immediate complications.

## 2022-08-25 NOTE — Transfer of Care (Signed)
Immediate Anesthesia Transfer of Care Note  Patient: [name]  Procedure(s) Performed: ANTERIOR LUMBAR FOUR TO FIVE INTERBODY FUSION, ILIAC CRST BONE GRAFT HARVEST (Spine Lumbar) HARVEST ILIAC BONE GRAFT ABDOMINAL EXPOSURE  Patient Location: PACU  Anesthesia Type:General  Level of Consciousness: awake  Airway & Oxygen Therapy: Patient Spontanous Breathing  Post-op Assessment: Report given to RN and Post -op Vital signs reviewed and stable  Post vital signs: Reviewed and stable  Last Vitals:  Vitals Value Taken Time  BP 104/56 08/25/22 1031  Temp 98   Pulse 88 08/25/22 1035  Resp 24 08/25/22 1035  SpO2 100 % 08/25/22 1035  Vitals shown include unvalidated device data.  Last Pain:  Vitals:   08/25/22 0621  TempSrc:   PainSc: 0-No pain         Complications: No notable events documented.

## 2022-08-25 NOTE — Anesthesia Procedure Notes (Signed)
Procedure Name: Intubation Date/Time: 08/25/2022 7:58 AM  Performed by: Minerva Ends, CRNAPre-anesthesia Checklist: Patient identified, Emergency Drugs available, Suction available and Patient being monitored Patient Re-evaluated:Patient Re-evaluated prior to induction Oxygen Delivery Method: Circle system utilized Preoxygenation: Pre-oxygenation with 100% oxygen Induction Type: IV induction Ventilation: Mask ventilation without difficulty Laryngoscope Size: Mac and 3 Grade View: Grade I Tube type: Oral Tube size: 7.0 mm Number of attempts: 1 Airway Equipment and Method: Stylet and Oral airway Placement Confirmation: ETT inserted through vocal cords under direct vision, positive ETCO2 and breath sounds checked- equal and bilateral Secured at: 22 cm Tube secured with: Tape Dental Injury: Teeth and Oropharynx as per pre-operative assessment

## 2022-08-25 NOTE — OR Nursing (Signed)
No foreign body objects retained intraoperatively. Report given by radiologist Dr. Nyoka Cowden.

## 2022-08-25 NOTE — Op Note (Signed)
OPERATIVE REPORT  DATE OF SURGERY: 08/25/2022  PATIENT NAME:  [name] MRN: [id number] DOB: July 26, 1961  PCP: Redmond School, MD  PRE-OPERATIVE DIAGNOSIS: Degenerative lumbar disc disease L4-5 with neuropathic leg pain.  Status post prior L4-5 discectomy/decompression  POST-OPERATIVE DIAGNOSIS: Same  PROCEDURE:   Anterior lumbar interbody fusion L4-5  SURGEON:  Melina Schools, MD  Approach surgeon: Dr. Monica Martinez  PHYSICIAN ASSISTANT: None  ANESTHESIA:   General  EBL: 50 ml   Complications: None  Implants: RSB peek intervertebral cage 14 x 35 mm.  12 degree lordosis.  Independent 0 profile lumbar plate 14 x 39 mm.  12 degree lordosis.  5.0 x 20 mm locking screws.  Graft: DBX mix  BRIEF HISTORY: [name] is a 61 y.o. female who presented to my office with ongoing severe back buttock and intermittent neuropathic leg pain.  Attempts at conservative management failed to alleviate her pain and improve her quality of life.  Ultimately as result we elected to move forward with surgery.  All appropriate risks, benefits, alternatives were discussed with the patient and consent was obtained.  PROCEDURE DETAILS: Patient was brought into the operating room and was properly positioned on the operating room table.  After induction with general anesthesia the patient was endotracheally intubated.  A timeout was taken to confirm all important data: including patient, procedure, and the level. Teds, SCD's were applied.   Foley was inserted by the nurse and the patient's abdomen was prepped and draped in a standard fashion.  We marked out our incision site using fluoroscopy guidance.  The incisions were then infiltrated with quarter percent Marcaine with epinephrine.  At this point Dr. Carlis Abbott performed a standard anterior retroperitoneal approach to the lumbar spine.  Please refer to his dictation for  details.  A needle was placed into the L4-5 disc space after the retractor system was set  up and I confirmed that we are at the appropriate level.  At this point Dr. Carlis Abbott scrubbed out and I proceeded on with the fusion.  An annulotomy was performed with a 10 blade scalpel and using pituitary rongeurs curettes and Kerrison rongeurs I removed all of the disc material.  I then continued to work posteriorly until I was able to pass a small curved curette behind the vertebral body of L5 and L4 releasing the posterior annulus.  Once this was done I used a fine nerve hook to dissect through the annulus and used my Kerrison rongeurs to resect additional to complete the posterior decompression.  Once this was done I made sure I had removed all of the cartilaginous endplate from L4 and L5 and I had bleeding subchondral bone to facilitate the fusion.  Using live lateral fluoroscopy I confirmed I had parallel endplate distraction using a lamina spreader in the disc space.  Once I confirmed satisfactory decompression and parallel endplate distraction I then rasped the endplates.  I then inserted the trial devices and elected to use the size 14 large implant.  This provided the best overall fit with restoration of disc space height and lordosis.  The peek intervertebral cage was then packed with DBX mix and the wound was copiously irrigated with normal saline.  The cage was then [facility] into position.  I then treated the small defect in the L5 vertebral body to accommodate the insertion tap of the profile plate and then inserted the plate.  I confirmed using x-ray that satisfactory position of the cage and plate.  I then placed the 2  locking screws superiorly into the L4 vertebral body and a single screw into L5.  All 3 screws had excellent purchase.  The locking cap was then inserted to prevent backout of the screws.  I irrigated the wound copiously normal saline and then placed Floseal to aid in postoperative hemostasis.  The retractor was then sequentially released confirming hemostasis as they were being  released.  Final images were taken with the fluoroscopy machine and they confirmed satisfactory position of the cage and plate in both the AP and lateral planes.  The fascia of the external oblique was then closed with a running #1 PDS suture.  I then irrigated again and closed the deep fascia layered fashion with a running 0 Vicryl stitch followed by interrupted 2-0 Vicryl sutures.  At this point an AP x-ray was taken and read by the radiologist confirming there were no retained surgical instruments in the wound.  The skin was then reapproximated with 3-0 Monocryl.  Steri-Strips and a dry dressing were applied and the patient was ultimately extubated transferred to PACU without incident.  The end of the case all needle sponge counts were correct.  Melina Schools, MD 08/25/2022 10:27 AM

## 2022-08-25 NOTE — Op Note (Signed)
Date: August 25, 2022  Preoperative diagnosis: Chronic lower back pain  Postoperative diagnosis: Same  Procedure: Anterior spine exposure at the L4-L5 disc space via anterior retroperitoneal approach for L4-L5 ALIF  Surgeon: Dr. Marty Heck, MD  Co-surgeon: Dr. Melina Schools, MD  Indications: 61 year old female with chronic lower back pain with left leg radiculopathy.  She has failed conservative management.  Dr. Rolena Infante feels her L4-L5 disc space  is the pain generator.  She presents today for L4-5 ALIF.  Vascular surgery was asked to assist with anterior spine exposure.  She presents after risk benefits discussed.  Findings: Fluoroscopic C-arm was used in the lateral position to mark the L4-L5 disc space over the left rectus muscle.  After the abdominal was prepped and draped we made a paramedian incision over our preoperative mark over the left rectus muscle.  Dissected down to the anterior rectus sheath that was opened with a paramedian incision as well.  The left rectus muscle was mobilized to the midline and we entered into the retroperitoneum and mobilized peritoneum and left ureter toward the midline and we did take down some of the posterior rectus sheath above arcuate line after the peritoneum was bluntly dissected away.  The left iliac artery and vein were mobilized to the midline after ligating several iliolumbar branches.  Fixed NuVasive retractor was placed.  Confirmed we were at the correct level on lateral fluoroscopy with a needle in the disc space.  Anesthesia: General  Details: Patient was taken to the operating room after informed consent was obtained.  Placed on the operative table in the supine position.  General endotracheal anesthesia was induced.  Fluoroscopic C-arm was used in lateral position and the L4-5 disc space was marked over the left rectus muscle.  The abdominal wall was then prepped and draped in standard sterile fashion.  Antibiotics were given.   Timeout performed.  I made a paramedian incision over the left rectus muscle at our preoperative mark.  Dissected down through the subcutaneous tissue with Bovie cautery and used cerebellar retractors for added visualization.  We got to the anterior rectus sheath and this was then opened with a paramedian incision as well.  The left rectus muscle was mobilized toward the midline.  I entered into the retroperitoneum by mobilizing the peritoneum and left ureter to the midline.  I bluntly dissected the peritoneum off the posterior rectus sheath above arcuate line bluntly and then opened this with Metzenbaum scissors to get up to the L4-L5 disc space.  Dr. Rolena Infante then used hand-held Wiley retractors to pull the peritoneum and left ureter to the midline and I mobilized the left iliac artery to the midline visualized the left iliac vein.  Several iliolumbar branches were divided between 2-0 silk ties clips and divided.  I then mobilized the left iliac vein toward the midline as well with blunt dissection using suction and KD.  We had good exposure of the L4-L5 disc space.  A fixed NuVasive retractor was placed on the field.  I placed a 140 reverse lip on both sides of the disc space as well as a 120 reverse lip retractor cranial and caudal.  We had good exposure of the L4-5 disc space from the front.  Spinal needle was placed in the disc space.  We confirmed on lateral fluoroscopy we were at the correct L4-L5 level.  Case was turned over to Dr. Rolena Infante.  Complication: None  Condition: Stable  Marty Heck, MD [facility] Office: [place]

## 2022-08-25 NOTE — Brief Op Note (Signed)
08/25/2022  10:37 AM  PATIENT:  [name]  61 y.o. female  PRE-OPERATIVE DIAGNOSIS:  Post laminectomy syndrome with degenerative disc disease and radiculopathy  POST-OPERATIVE DIAGNOSIS:  Post laminectomy syndrome with degenerative disc disease and radiculopathy  PROCEDURE:  Procedure(s) with comments: ANTERIOR LUMBAR FOUR TO FIVE INTERBODY FUSION, ILIAC CRST BONE GRAFT HARVEST (N/A) - 4 HRS DR. CLARK TO DO APPROACH 3 C-BED HARVEST ILIAC BONE GRAFT (N/A) ABDOMINAL EXPOSURE (N/A)  SURGEON:  Surgeon(s) and Role: Panel 1:    Melina Schools, MD - Primary Panel 2:    * Marty Heck, MD - Primary  PHYSICIAN ASSISTANT:   ASSISTANTS: none   ANESTHESIA:   general  EBL:  50 mL   BLOOD ADMINISTERED:none  DRAINS: none   LOCAL MEDICATIONS USED:  MARCAINE     SPECIMEN:  No Specimen  DISPOSITION OF SPECIMEN:  N/A  COUNTS:  YES  TOURNIQUET:  * No tourniquets in log *  DICTATION: .Dragon Dictation  PLAN OF CARE: Admit to inpatient   PATIENT DISPOSITION:  PACU - hemodynamically stable.

## 2022-08-26 MED ORDER — MAGNESIUM CITRATE PO SOLN
1.0000 | Freq: Once | ORAL | Status: AC
  Administered 2022-08-26: 1 via ORAL
  Filled 2022-08-26: qty 296

## 2022-08-26 MED ORDER — BISACODYL 10 MG RE SUPP
10.0000 mg | Freq: Once | RECTAL | Status: AC
  Administered 2022-08-26: 10 mg via RECTAL
  Filled 2022-08-26: qty 1

## 2022-08-26 MED FILL — Thrombin For Soln 20000 Unit: CUTANEOUS | Qty: 1 | Status: AC

## 2022-08-26 NOTE — Anesthesia Postprocedure Evaluation (Signed)
Anesthesia Post Note  Patient: [name]  Procedure(s) Performed: ANTERIOR LUMBAR FOUR TO FIVE INTERBODY FUSION, ILIAC CRST BONE GRAFT HARVEST (Spine Lumbar) HARVEST ILIAC BONE GRAFT ABDOMINAL EXPOSURE     Patient location during evaluation: PACU Anesthesia Type: General Level of consciousness: awake and alert Pain management: pain level controlled Vital Signs Assessment: post-procedure vital signs reviewed and stable Respiratory status: spontaneous breathing, nonlabored ventilation, respiratory function stable and patient connected to nasal cannula oxygen Cardiovascular status: blood pressure returned to baseline and stable Postop Assessment: no apparent nausea or vomiting Anesthetic complications: no   No notable events documented.  Last Vitals:  Vitals:   08/26/22 0728 08/26/22 1127  BP: 106/69 113/61  Pulse: (!) 109 100  Resp: 18 16  Temp: 37.2 C 36.9 C  SpO2: 97% 100%    Last Pain:  Vitals:   08/26/22 1127  TempSrc: Oral  PainSc:                  [name]

## 2022-08-26 NOTE — Discharge Summary (Signed)
Patient ID: [name] MRN: [id number] DOB/AGE: 02-22-1961 61 y.o.  Admit date: 08/25/2022 Discharge date: 08/26/2022  Admission Diagnoses:  Principal Problem:   S/P lumbar fusion   Discharge Diagnoses:  Principal Problem:   S/P lumbar fusion  status post Procedure(s): ANTERIOR LUMBAR FOUR TO FIVE INTERBODY FUSION, ILIAC CRST BONE GRAFT HARVEST HARVEST ILIAC BONE GRAFT ABDOMINAL EXPOSURE  Past Medical History:  Diagnosis Date   Anemia    Arthritis    Breast cancer ([place])    right breast cancer 5 yrs ago   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   APPENDIX IN [facility]   Depression    Dysrhythmia    hx palpitations-too much caffiene   Headache    patient denies this dx   Scoliosis     Surgeries: Procedure(s): ANTERIOR LUMBAR FOUR TO FIVE INTERBODY FUSION, ILIAC CRST BONE GRAFT HARVEST HARVEST ILIAC BONE GRAFT ABDOMINAL EXPOSURE on 08/25/2022   Consultants: Treatment Team:  Marty Heck, MD  Discharged Condition: Improved  Hospital Course: [name] is an 61 y.o. female who was admitted 08/25/2022 for operative treatment of S/P lumbar fusion. Patient failed conservative treatments (please see the history and physical for the specifics) and had severe unremitting pain that affects sleep, daily activities and work/hobbies. After pre-op clearance, the patient was taken to the operating room on 08/25/2022 and underwent  Procedure(s): ANTERIOR LUMBAR FOUR TO FIVE INTERBODY FUSION, ILIAC CRST BONE GRAFT HARVEST HARVEST ILIAC BONE GRAFT ABDOMINAL EXPOSURE.    Patient was given perioperative antibiotics:  Anti-infectives (From admission, onward)    Start     Dose/Rate Route Frequency Ordered Stop   08/25/22 1600  ceFAZolin (ANCEF) IVPB 1 g/50 mL premix        1 g 100 mL/hr over 30 Minutes Intravenous Every 8 hours 08/25/22 1421 08/25/22 2313   08/25/22 0600  ceFAZolin (ANCEF) IVPB 2g/100 mL premix        2 g 200 mL/hr over 30 Minutes Intravenous 30 min pre-op  08/25/22 0600 08/25/22 0820        Patient was given sequential compression devices and early ambulation to prevent DVT.   Patient benefited maximally from hospital stay and there were no complications. At the time of discharge, the patient was urinating/moving their bowels without difficulty, tolerating a regular diet, pain is controlled with oral pain medications and they have been cleared by PT/OT.   Recent vital signs: Patient Vitals for the past 24 hrs:  BP Temp Temp src Pulse Resp SpO2  08/26/22 1127 113/61 98.4 F (36.9 C) Oral 100 16 100 %  08/26/22 0728 106/69 98.9 F (37.2 C) Oral (!) 109 18 97 %  08/26/22 0338 123/74 98.6 F (37 C) Oral 95 18 100 %  08/25/22 2236 110/62 98.4 F (36.9 C) Oral 92 18 99 %  08/25/22 1951 131/73 97.7 F (36.5 C) Oral 100 18 100 %  08/25/22 1748 (!) 105/48 98.2 F (36.8 C) Oral 100 16 95 %  08/25/22 1419 128/73 98 F (36.7 C) Oral 91 18 99 %  08/25/22 1330 115/67 -- -- 94 20 100 %  08/25/22 1300 123/72 -- -- 92 18 99 %  08/25/22 1230 117/67 -- -- 93 12 95 %  08/25/22 1200 106/63 98.4 F (36.9 C) -- 91 13 95 %  08/25/22 1145 113/65 -- -- 93 16 99 %     Recent laboratory studies:  Recent Labs    08/25/22 1529  WBC 8.5  HGB 11.9*  HCT 36.8  PLT 232  CREATININE 0.74     Discharge Medications:   Allergies as of 08/26/2022   No Known Allergies      Medication List     STOP taking these medications    amoxicillin-clavulanate 875-125 MG tablet Commonly known as: AUGMENTIN   cyclobenzaprine 10 MG tablet Commonly known as: FLEXERIL   HYDROcodone-acetaminophen 10-325 MG tablet Commonly known as: NORCO   ibuprofen 800 MG tablet Commonly known as: ADVIL   MULTIVITAMIN WOMEN 50+ PO   sertraline 50 MG tablet Commonly known as: ZOLOFT   triamcinolone cream 0.1 % Commonly known as: KENALOG       TAKE these medications    ALPRAZolam 0.5 MG tablet Commonly known as: XANAX Take 0.5 mg by mouth 3 (three) times daily  as needed for anxiety.   amitriptyline 50 MG tablet Commonly known as: ELAVIL Take 50 mg by mouth at bedtime.   bethanechol 5 MG tablet Commonly known as: URECHOLINE Take 5 mg by mouth 3 (three) times daily.   enoxaparin 40 MG/0.4ML injection Commonly known as: LOVENOX Inject 0.4 mLs (40 mg total) into the skin daily. 10 day supply 1 injection per day   escitalopram 20 MG tablet Commonly known as: LEXAPRO Take 20 mg by mouth daily.   gabapentin 300 MG capsule Commonly known as: NEURONTIN Take 300 mg by mouth 3 (three) times daily as needed (pain).   iron polysaccharides 150 MG capsule Commonly known as: NIFEREX Take 150 mg by mouth daily as needed (fatigue).   methocarbamol 500 MG tablet Commonly known as: ROBAXIN Take 1 tablet (500 mg total) by mouth every 8 (eight) hours as needed for up to 5 days for muscle spasms.   ondansetron 4 MG tablet Commonly known as: Zofran Take 1 tablet (4 mg total) by mouth every 8 (eight) hours as needed for nausea or vomiting.   oxyCODONE-acetaminophen 10-325 MG tablet Commonly known as: Percocet Take 1 tablet by mouth every 6 (six) hours as needed for up to 5 days for pain.        Diagnostic Studies: DG Lumbar Spine 2-3 Views  Result Date: 08/25/2022 CLINICAL DATA:  Anterior L4-L5 interbody fusion EXAM: LUMBAR SPINE - 2-3 VIEW COMPARISON:  04/19/2021; MRI lumbar spine 10/06/2021 Fluoroscopy time: 0 minutes 48 seconds Dose: 29.34 mGy Images: 3 FINDINGS: Prior MR labeled with 5 lumbar vertebra. Images demonstrate placement of a disc prosthesis with anterior plate and screws at A2-Z3. No fracture subluxation. IMPRESSION: Anterior fusion L4-L5. Electronically Signed   By: Lavonia Dana M.D.   On: 08/25/2022 10:54   DG OR LOCAL ABDOMEN  Result Date: 08/25/2022 CLINICAL DATA:  Lumbar surgery. EXAM: OR LOCAL ABDOMEN COMPARISON:  May 15, 2014. FINDINGS: Status post surgical fusion of L4-5. Surgical staples are seen to the left of L5. No  other radiopaque foreign body is noted. IMPRESSION: Status post surgical fusion of L4-5. Surgical staples seen to the left of L5. No other definite radiopaque foreign body seen. These results were called by telephone at the time of interpretation on 08/25/2022 at 10:17 am to provider Page Luan Pulling in OR 4, who verbally acknowledged these results. Electronically Signed   By: Marijo Conception M.D.   On: 08/25/2022 10:17   DG C-Arm 1-60 Min-No Report  Result Date: 08/25/2022 Fluoroscopy was utilized by the requesting physician.  No radiographic interpretation.   DG C-Arm 1-60 Min-No Report  Result Date: 08/25/2022 Fluoroscopy was utilized by the requesting physician.  No radiographic interpretation.   MR BRAIN  WO CONTRAST  Result Date: 08/05/2022 CLINICAL DATA:  Difficulty with finding words. EXAM: MRI HEAD WITHOUT CONTRAST TECHNIQUE: Multiplanar, multiecho pulse sequences of the brain and surrounding structures were obtained without intravenous contrast. COMPARISON:  Same-day CT/CTA head and neck FINDINGS: Brain: There is no acute intracranial hemorrhage, extra-axial fluid collection, or acute infarct. Parenchymal volume is normal. The ventricles are normal in size. Gray-white differentiation is preserved. There are scattered small foci of FLAIR signal abnormality in the right internal capsule and bilateral frontoparietal white matter, nonspecific but likely reflecting sequela of mild chronic small-vessel ischemic change There is no mass lesion.  There is no mass effect or midline shift. Vascular: Normal flow voids. Skull and upper cervical spine: Normal marrow signal. Sinuses/Orbits: The paranasal sinuses are clear. The globes and orbits are unremarkable. Other: None. IMPRESSION: No acute intracranial pathology. Electronically Signed   By: Valetta Mole M.D.   On: 08/05/2022 12:24   CT ANGIO HEAD NECK W WO CM  Result Date: 08/05/2022 CLINICAL DATA:  Stroke follow-up.  Word-finding difficulties. EXAM:  CT ANGIOGRAPHY HEAD AND NECK TECHNIQUE: Multidetector CT imaging of the head and neck was performed using the standard protocol during bolus administration of intravenous contrast. Multiplanar CT image reconstructions and MIPs were obtained to evaluate the vascular anatomy. Carotid stenosis measurements (when applicable) are obtained utilizing NASCET criteria, using the distal internal carotid diameter as the denominator. RADIATION DOSE REDUCTION: This exam was performed according to the departmental dose-optimization program which includes automated exposure control, adjustment of the mA and/or kV according to patient size and/or use of iterative reconstruction technique. CONTRAST:  17m OMNIPAQUE IOHEXOL 350 MG/ML SOLN COMPARISON:  CT Head 11/14/19 FINDINGS: CT HEAD FINDINGS Brain: No evidence of acute infarction, hemorrhage, hydrocephalus, extra-axial collection or mass lesion/mass effect. Note that assessment for the presence of contrast-enhancing lesions is limited due to timing of the contrast bolus. Vascular: No hyperdense vessel or unexpected calcification. Skull: Normal. Negative for fracture or focal lesion. Sinuses/Orbits: No acute finding. Other: None. Review of the MIP images confirms the above findings CTA NECK FINDINGS Aortic arch: Standard branching. Imaged portion shows no evidence of aneurysm or dissection. No significant stenosis of the major arch vessel origins. Right carotid system: No evidence of dissection, stenosis (50% or greater), or occlusion. Left carotid system: No evidence of dissection, stenosis (50% or greater), or occlusion. Vertebral arteries: Codominant. No evidence of dissection, stenosis (50% or greater), or occlusion. Skeleton: Postsurgical changes from C4-C6 ACDF and posterior decompression at C4 and C5. Other neck: Negative. Upper chest: Negative. Review of the MIP images confirms the above findings CTA HEAD FINDINGS Anterior circulation: No significant stenosis, proximal  occlusion, aneurysm, or vascular malformation. Posterior circulation: No significant stenosis, proximal occlusion, aneurysm, or vascular malformation. Venous sinuses: As permitted by contrast timing, patent. Anatomic variants: None Review of the MIP images confirms the above findings IMPRESSION: 1. No acute intracranial abnormality. 2. No intracranial large vessel occlusion, significant stenosis, aneurysm, or vascular malformation. Electronically Signed   By: HMarin RobertsM.D.   On: 08/05/2022 10:12    Discharge Instructions     Incentive spirometry RT   Complete by: As directed         Follow-up Information     BMelina Schools MD. Schedule an appointment as soon as possible for a visit in 2 week(s).   Specialty: Orthopedic Surgery Why: If symptoms worsen, For suture removal, For wound re-check Contact information: [address] [place] [id number] [phone]  Discharge Plan:  discharge to home  Disposition: Patient is doing exceptionally well.  She is voiding spontaneously, positive flatus, and she is ambulating with minimal assistance.  Patient has been seen and cleared for discharge by physical and Occupational Therapy.  Patient's incision is clean dry and intact.  She is tolerating a regular diet and her pain is significantly improved.  Patiently discharged with a 10-day course of Lovenox, Percocet for pain control, Robaxin as a muscle relaxer, and Zofran if she has nausea.  She will follow-up with me in 10 days for reevaluation.    Signed: Dahlia Bailiff for Dr. Melina Schools [facility] [phone] 08/26/2022, 11:41 AM

## 2022-08-26 NOTE — Evaluation (Signed)
Occupational Therapy Evaluation Patient Details Name: [name] MRN: [id number] DOB: [birth year] Today's Date: 08/26/2022   History of Present Illness The pt is a 61 y.o. female with severe back, buttock, and intermittent neuropathic leg pain who has failed conservative management. Now s/p anterior lumbar 4-5 interbody fusion, iliac crest bone graft harvest. PMH significant for anemia, arthritis, breast cancer, depression, dysrhythmia, headache, and scoliosis. (Simultaneous filing. User may not have seen previous data.)   Clinical Impression   PTA, pt lived with husband and was independent. Upon eval, pt performing all ADL with mod I. Pt educated and demonstrating use of compensatory techniques for bed mobility, toileting, LB dressing, car transfers, brace application, grooming within precautions. Husband did independent assist with brace application only. Recommending discharge home with no OT follow up at this time. OT to sign off. Thank you for this order.      Recommendations for follow up therapy are one component of a multi-disciplinary discharge planning process, led by the attending physician.  Recommendations may be updated based on patient status, additional functional criteria and insurance authorization.   Follow Up Recommendations  No OT follow up     Assistance Recommended at Discharge Intermittent Supervision/Assistance  Patient can return home with the following A little help with bathing/dressing/bathroom;Assist for transportation;Help with stairs or ramp for entrance    Functional Status Assessment  Patient has had a recent decline in their functional status and demonstrates the ability to make significant improvements in function in a reasonable and predictable amount of time.  Equipment Recommendations  None recommended by OT    Recommendations for Other Services       Precautions / Restrictions Precautions Precautions: Back (Simultaneous filing. User may not have  seen previous data.) Precaution Booklet Issued: Yes (comment) (Simultaneous filing. User may not have seen previous data.) Precaution Comments: All education reviewed during ADL and handotu provided Required Braces or Orthoses: Spinal Brace (Simultaneous filing. User may not have seen previous data.) Spinal Brace: Lumbar corset;Applied in sitting position (Simultaneous filing. User may not have seen previous data.) Restrictions Weight Bearing Restrictions: No (Simultaneous filing. User may not have seen previous data.)      Mobility Bed Mobility               General bed mobility comments: EOB on arrival and departure, but pt reports she has been doing this without A. Educating husband on appropriate ways to assist if needed in home setting.    Transfers Overall transfer level: Modified independent Equipment used: None                      Balance Overall balance assessment: Modified Independent                                         ADL either performed or assessed with clinical judgement   ADL Overall ADL's : Modified independent                                       General ADL Comments: Pt performing all ADL with mod I and within precautions after initial education     Vision Baseline Vision/History: 1 Wears glasses Ability to See in Adequate Light: 0 Adequate Patient Visual Report: No change from baseline Vision Assessment?: No apparent visual  deficits     Perception Perception Perception Tested?: No   Praxis Praxis Praxis tested?: Not tested    Pertinent Vitals/Pain Pain Assessment Pain Assessment: No/denies pain (Simultaneous filing. User may not have seen previous data.) Pain Intervention(s): Monitored during session     Hand Dominance     Extremity/Trunk Assessment Upper Extremity Assessment Upper Extremity Assessment: Overall WFL for tasks assessed   Lower Extremity Assessment Lower Extremity  Assessment: Defer to PT evaluation   Cervical / Trunk Assessment Cervical / Trunk Assessment: Back Surgery   Communication Communication Communication: No difficulties (Simultaneous filing. User may not have seen previous data.)   Cognition Arousal/Alertness: Awake/alert Behavior During Therapy: WFL for tasks assessed/performed Overall Cognitive Status: Within Functional Limits for tasks assessed                                       General Comments  husband present. Pt concerned that her bowels have not moved yet.    Exercises     Shoulder Instructions      Home Living Family/patient expects to be discharged to:: Private residence (Simultaneous filing. User may not have seen previous data.) Living Arrangements: Spouse/significant other (Simultaneous filing. User may not have seen previous data.) Available Help at Discharge: Family (Simultaneous filing. User may not have seen previous data.) Type of Home: House (Simultaneous filing. User may not have seen previous data.) Home Access: Level entry (perhaps small threshold, but pt reporting level  Simultaneous filing. User may not have seen previous data.) Entrance Stairs-Number of Steps: 1   Home Layout: One level (Simultaneous filing. User may not have seen previous data.)     Bathroom Shower/Tub: Walk-in shower (Simultaneous filing. User may not have seen previous data.)   Bathroom Toilet: Handicapped height     Home Equipment: Shower seat - built in English as a second language teacher. User may not have seen previous data.)          Prior Functioning/Environment Prior Level of Function : Independent/Modified Independent (Simultaneous filing. User may not have seen previous data.)             Mobility Comments: no AD ADLs Comments: Independent        OT Problem List: Decreased strength;Impaired balance (sitting and/or standing);Decreased activity tolerance;Decreased knowledge of precautions      OT  Treatment/Interventions:      OT Goals(Current goals can be found in the care plan section) Acute Rehab OT Goals Patient Stated Goal: have bowel movement OT Goal Formulation: With patient/family  OT Frequency:      Co-evaluation              AM-PAC OT "6 Clicks" Daily Activity     Outcome Measure Help from another person eating meals?: None Help from another person taking care of personal grooming?: None Help from another person toileting, which includes using toliet, bedpan, or urinal?: None Help from another person bathing (including washing, rinsing, drying)?: None Help from another person to put on and taking off regular upper body clothing?: None Help from another person to put on and taking off regular lower body clothing?: None 6 Click Score: 24   End of Session Equipment Utilized During Treatment: Gait belt;Back brace Nurse Communication: Mobility status  Activity Tolerance: Patient tolerated treatment well Patient left: in bed;with call bell/phone within reach;with family/visitor present  OT Visit Diagnosis: Unsteadiness on feet (R26.81);Muscle weakness (generalized) (M62.81)  Time: 2951-8841 OT Time Calculation (min): 25 min Charges:  OT General Charges $OT Visit: 1 Visit OT Evaluation $OT Eval Low Complexity: 1 Low OT Treatments $Self Care/Home Management : 8-22 mins  Elder Cyphers, OTR/L [facility] Acute Rehabilitation Office: [phone]   Magnus Ivan 08/26/2022, 11:43 AM

## 2022-08-26 NOTE — Evaluation (Signed)
Physical Therapy Evaluation and DC Patient Details Name: [name] MRN: [id number] DOB: 20-Apr-1961 Today's Date: 08/26/2022  History of Present Illness  The pt is a 61 y.o. female with severe back, buttock, and intermittent neuropathic leg pain who has failed conservative management. Now s/p anterior lumbar 4-5 interbody fusion, iliac crest bone graft harvest. PMH significant for anemia, arthritis, breast cancer, depression, dysrhythmia, headache, and scoliosis. (Simultaneous filing. User may not have seen previous data.)  Clinical Impression   Patient evaluated by Physical Therapy with no further acute PT needs identified. All education has been completed and the patient has no further questions. Questions solicited and answered; discussed car transfers;  See below for any follow-up Physical Therapy or equipment needs. PT is signing off. Thank you for this referral.        Recommendations for follow up therapy are one component of a multi-disciplinary discharge planning process, led by the attending physician.  Recommendations may be updated based on patient status, additional functional criteria and insurance authorization.  Follow Up Recommendations Other (comment) (The potential need for Outpatient PT can be addressed at Ortho follow-up appointments. )      Assistance Recommended at Discharge PRN  Patient can return home with the following       Equipment Recommendations None recommended by PT  Recommendations for Other Services       Functional Status Assessment Patient has had a recent decline in their functional status and demonstrates the ability to make significant improvements in function in a reasonable and predictable amount of time.     Precautions / Restrictions Precautions Precautions: Back (Simultaneous filing. User may not have seen previous data.) Precaution Booklet Issued: Yes (comment) (Simultaneous filing. User may not have seen previous data.) Precaution  Comments: All education reviewed during ADL and handotu provided Required Braces or Orthoses: Spinal Brace (Simultaneous filing. User may not have seen previous data.) Spinal Brace: Lumbar corset;Applied in sitting position (Simultaneous filing. User may not have seen previous data.) Restrictions Weight Bearing Restrictions: No (Simultaneous filing. User may not have seen previous data.)      Mobility  Bed Mobility                    Transfers                        Ambulation/Gait Ambulation/Gait assistance: Modified independent (Device/Increase time) Gait Distance (Feet): 300 Feet (greater than) Assistive device: None (occasional handheld assist) Gait Pattern/deviations: Step-through pattern       General Gait Details: no difficulty; husband giving good assist  Stairs Stairs: Yes Stairs assistance: Min assist Stair Management: Step to pattern Number of Stairs: 4 General stair comments: Managing well with husband assist  Wheelchair Mobility    Modified Rankin (Stroke Patients Only)       Balance                                             Pertinent Vitals/Pain Pain Assessment Faces Pain Scale: Hurts a little bit Pain Location: Op s    Home Living Family/patient expects to be discharged to:: Private residence (Simultaneous filing. User may not have seen previous data.) Living Arrangements: Spouse/significant other (Simultaneous filing. User may not have seen previous data.) Available Help at Discharge: Family (Simultaneous filing. User may not have seen previous data.) Type of Home:  House (Simultaneous filing. User may not have seen previous data.) Home Access: Level entry (perhaps small threshold, but pt reporting level  Simultaneous filing. User may not have seen previous data.)   Entrance Stairs-Number of Steps: 1   Home Layout: One level (Simultaneous filing. User may not have seen previous data.) Home Equipment:  Shower seat - built in English as a second language teacher. User may not have seen previous data.)      Prior Function Prior Level of Function : Independent/Modified Independent (Simultaneous filing. User may not have seen previous data.)             Mobility Comments: no AD ADLs Comments: Independent     Hand Dominance        Extremity/Trunk Assessment   Upper Extremity Assessment Upper Extremity Assessment: Overall WFL for tasks assessed    Lower Extremity Assessment Lower Extremity Assessment: Defer to PT evaluation    Cervical / Trunk Assessment Cervical / Trunk Assessment: Back Surgery  Communication   Communication: No difficulties (Simultaneous filing. User may not have seen previous data.)  Cognition                                                General Comments General comments (skin integrity, edema, etc.): husband present. Pt concerned that her bowels have not moved yet.    Exercises     Assessment/Plan    PT Assessment Patient does not need any further PT services  PT Problem List         PT Treatment Interventions      PT Goals (Current goals can be found in the Care Plan section)  Acute Rehab PT Goals Patient Stated Goal: Home soon PT Goal Formulation: All assessment and education complete, DC therapy    Frequency       Co-evaluation               AM-PAC PT "6 Clicks" Mobility  Outcome Measure Help needed turning from your back to your side while in a flat bed without using bedrails?: A Little Help needed moving from lying on your back to sitting on the side of a flat bed without using bedrails?: None Help needed moving to and from a bed to a chair (including a wheelchair)?: None Help needed standing up from a chair using your arms (e.g., wheelchair or bedside chair)?: None Help needed to walk in hospital room?: None Help needed climbing 3-5 steps with a railing? : A Little 6 Click Score: 22    End of Session Equipment  Utilized During Treatment: Back brace Activity Tolerance: Patient tolerated treatment well Patient left: in bed;with call bell/phone within reach;with family/visitor present Nurse Communication: Mobility status (ok for DC) PT Visit Diagnosis: Other abnormalities of gait and mobility (R26.89)    Time: 6812-7517 PT Time Calculation (min) (ACUTE ONLY): 17 min   Charges:   PT Evaluation $PT Eval Low Complexity: 1 Low          Roney Marion, [place] [facility] [place] 08/26/2022, 11:47 AM

## 2022-08-26 NOTE — Progress Notes (Signed)
Patient alert and oriented, [name]'s well, voiding adequate amount of urine, swallowing without difficulty, no c/o pain at time of discharge. Patient discharged home with family. Script and discharged instructions given to patient. Patient and family stated understanding of instructions given. Patient has an appointment with Dr. Brooks in 2 weeks 

## 2022-08-26 NOTE — Progress Notes (Addendum)
  Progress Note    08/26/2022 6:47 AM 1 Day Post-Op  Subjective:  walking the hallway with her husband of 40 years hand in hand.  Very appreciative.  Denies any issues.  Ate a full breakfast this morning to get her bowels moving.   afebrile  Vitals:   08/25/22 2236 08/26/22 0338  BP: 110/62 123/74  Pulse: 92 95  Resp: 18 18  Temp: 98.4 F (36.9 C) 98.6 F (37 C)  SpO2: 99% 100%    Physical Exam: General:  no distress Cardiac:  regular Lungs:  non labored Incisions:  honeycomb dressing in place-clean and dry Extremities:  palpable DP pulses bilaterally Abdomen:  soft, NT  CBC    Component Value Date/Time   WBC 8.5 08/25/2022 1529   RBC 3.80 (L) 08/25/2022 1529   HGB 11.9 (L) 08/25/2022 1529   HCT 36.8 08/25/2022 1529   PLT 232 08/25/2022 1529   MCV 96.8 08/25/2022 1529   MCH 31.3 08/25/2022 1529   MCHC 32.3 08/25/2022 1529   RDW 13.4 08/25/2022 1529   LYMPHSABS 1.6 08/05/2022 0842   MONOABS 0.5 08/05/2022 0842   EOSABS 0.0 08/05/2022 0842   BASOSABS 0.1 08/05/2022 0842    BMET    Component Value Date/Time   NA 139 08/05/2022 0842   K 3.3 (L) 08/05/2022 0842   CL 103 08/05/2022 0842   CO2 23 08/05/2022 0842   GLUCOSE 96 08/05/2022 0842   BUN 14 08/05/2022 0842   CREATININE 0.74 08/25/2022 1529   CALCIUM 9.7 08/05/2022 0842   GFRNONAA >60 08/25/2022 1529   GFRAA >60 11/14/2019 1617    INR No results found for: "INR"   Intake/Output Summary (Last 24 hours) at 08/26/2022 0647 Last data filed at 08/25/2022 1845 Gross per 24 hour  Intake 1050 ml  Output 150 ml  Net 900 ml      Assessment/Plan:  61 y.o. female is s/p:  Anterior spine exposure at the L4-L5 disc space via anterior retroperitoneal approach for L4-L5 ALIF   1 Day Post-Op   -pt doing well this morning with palpable DP pulses. -honeycomb dressing is clean and dry -follow up with vascular surgery as needed.     Leontine Locket, PA-C [facility] [phone] 08/26/2022 6:47 AM  I have seen and evaluated the patient. I agree with the PA note as documented above.  Postop day 1 status post anterior spine exposure for L4-L5 ALIF.  Looks good from a vascular standpoint.  No nausea vomiting.  Appropriate postop abdominal incision tenderness.  Left DP palpable.  Marty Heck, MD [facility] [place] Office: [phone]

## 2022-08-29 ENCOUNTER — Encounter (HOSPITAL_COMMUNITY): Payer: Self-pay | Admitting: Orthopedic Surgery

## 2022-10-05 DIAGNOSIS — Z4889 Encounter for other specified surgical aftercare: Secondary | ICD-10-CM | POA: Diagnosis not present

## 2022-10-07 MED FILL — Heparin Sodium (Porcine) Inj 1000 Unit/ML: INTRAMUSCULAR | Qty: 30 | Status: AC

## 2022-10-07 MED FILL — Sodium Chloride IV Soln 0.9%: INTRAVENOUS | Qty: 1000 | Status: AC

## 2022-10-21 DIAGNOSIS — F339 Major depressive disorder, recurrent, unspecified: Secondary | ICD-10-CM | POA: Diagnosis not present

## 2022-10-21 DIAGNOSIS — K219 Gastro-esophageal reflux disease without esophagitis: Secondary | ICD-10-CM | POA: Diagnosis not present

## 2022-10-21 DIAGNOSIS — C50211 Malignant neoplasm of upper-inner quadrant of right female breast: Secondary | ICD-10-CM | POA: Diagnosis not present

## 2022-10-21 DIAGNOSIS — I10 Essential (primary) hypertension: Secondary | ICD-10-CM | POA: Diagnosis not present

## 2022-10-21 DIAGNOSIS — F419 Anxiety disorder, unspecified: Secondary | ICD-10-CM | POA: Diagnosis not present

## 2022-10-21 DIAGNOSIS — Z6823 Body mass index (BMI) 23.0-23.9, adult: Secondary | ICD-10-CM | POA: Diagnosis not present

## 2022-10-21 DIAGNOSIS — G894 Chronic pain syndrome: Secondary | ICD-10-CM | POA: Diagnosis not present

## 2022-10-21 DIAGNOSIS — F320 Major depressive disorder, single episode, mild: Secondary | ICD-10-CM | POA: Diagnosis not present

## 2022-10-24 DIAGNOSIS — M545 Low back pain, unspecified: Secondary | ICD-10-CM | POA: Diagnosis not present

## 2022-10-29 ENCOUNTER — Ambulatory Visit
Admission: EM | Admit: 2022-10-29 | Discharge: 2022-10-29 | Disposition: A | Discharge status: Home / Self Care | Payer: Medicare HMO

## 2022-10-29 DIAGNOSIS — R1013 Epigastric pain: Secondary | ICD-10-CM | POA: Diagnosis not present

## 2022-10-29 DIAGNOSIS — F411 Generalized anxiety disorder: Secondary | ICD-10-CM | POA: Diagnosis not present

## 2022-10-29 LAB — POCT URINALYSIS DIP (MANUAL ENTRY)
Bilirubin, UA: NEGATIVE
Glucose, UA: NEGATIVE mg/dL
Ketones, POC UA: NEGATIVE mg/dL
Leukocytes, UA: NEGATIVE
Nitrite, UA: NEGATIVE
Protein Ur, POC: NEGATIVE mg/dL
Spec Grav, UA: 1.02 (ref 1.010–1.025)
Urobilinogen, UA: 0.2 E.U./dL
pH, UA: 6 (ref 5.0–8.0)

## 2022-10-29 MED ORDER — ONDANSETRON 4 MG PO TBDP: 4.0000 mg | ORAL_TABLET | Freq: Three times a day (TID) | ORAL | 0 refills | Status: DC | PRN

## 2022-10-29 MED ORDER — HYDROXYZINE HCL 25 MG PO TABS: 25.0000 mg | ORAL_TABLET | Freq: Three times a day (TID) | ORAL | 0 refills | Status: DC | PRN

## 2022-10-29 NOTE — ED Triage Notes (Addendum)
Pt report she has been having "movement" in her stomach. "Feels like nerves" x 3 weeks

## 2022-10-29 NOTE — ED Provider Notes (Signed)
[facility]    CSN: [id number] Arrival date & time: 10/29/22  0802      History   Chief Complaint Chief Complaint  Patient presents with   Abdominal Pain    HPI [name] is a 62 y.o. female.   Patient presenting today with 3-week history of feeling like her stomach is churning/jittery with occasional nausea.  States she has been under a lot of stress lately and it feels like her nerves are upsetting her stomach.  Denies vomiting, diarrhea, constipation, fever, chills, body aches, weight changes, melena, urinary symptoms.  She denies any history of similar issues, new medications, vitamin or supplement changes, recent travel, sick contacts.  Not tried anything over-the-counter for symptoms.    Past Medical History:  Diagnosis Date   Anemia    Arthritis    Breast cancer ([place])    right breast cancer 5 yrs ago   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   APPENDIX IN LUQ   Depression    Dysrhythmia    hx palpitations-too much caffiene   Headache    patient denies this dx   Scoliosis     Patient Active Problem List   Diagnosis Date Noted   S/P lumbar fusion 08/25/2022   Low back pain 08/02/2022   Stenosis of lateral recess of lumbar spine 08/02/2022   Pill dysphagia 04/19/2022   Women's annual routine gynecological examination 06/21/2021   Lumbar radiculopathy 06/11/2021   Lumbar pain 04/19/2021   Mass of upper outer quadrant of left breast 04/13/2021   Encounter for screening fecal occult blood testing 06/18/2020   Encounter for gynecological examination with Papanicolaou smear of cervix 06/18/2020   Mass of wrist 03/23/2020   Encounter for orthopedic follow-up care 03/17/2020   Left wrist pain 02/13/2020   NSAID induced gastritis    Anemia 12/20/2019   Dyspepsia 10/23/2019   Simple bone cyst 11/23/2018   Constipation 04/16/2018   Encounter for screening colonoscopy 04/16/2018   Displacement of cervical intervertebral disc without myelopathy  02/20/2017   Cancer of breast, intraductal, right 12/21/2016   Pap smear, as part of routine gynecological examination 12/21/2016   Anxiety 12/09/2015   Muscular aches 01/05/2015   Reactive depression 01/05/2015   Perimenopausal and postmenopausal vasomotor symptoms 10/09/2014   Hot flashes due to tamoxifen 07/28/2014   Memory loss 07/28/2014   Vision changes 07/28/2014   Routine gynecological examination 11/21/2013   Cancer of upper-inner quadrant of female breast ([place]) 10/11/2013   Breast cancer ([place]) 09/04/2013   Cervical spondylosis without myelopathy 04/03/2013   Pain in elbow 04/03/2013   Menorrhagia with irregular cycle 07/10/2012    Past Surgical History:  Procedure Laterality Date   ABDOMINAL EXPOSURE N/A 08/25/2022   Procedure: ABDOMINAL EXPOSURE;  Surgeon: Marty Heck, MD;  Location[facility];  Service: Vascular;  Laterality: N/A;   ANKLE SURGERY     left ankle cyst   ANTERIOR LUMBAR FUSION N/A 08/25/2022   Procedure: ANTERIOR LUMBAR FOUR TO FIVE INTERBODY FUSION, ILIAC CRST BONE GRAFT HARVEST;  Surgeon: Melina Schools, MD;  Location: [place];  Service: Orthopedics;  Laterality: N/A;  4 HRS DR. CLARK TO DO APPROACH 3 C-BED   BACK SURGERY  2000   lumbar disc surgery   BIOPSY  01/07/2020   Procedure: BIOPSY;  Surgeon: Danie Binder, MD;  Location[facility];  Service: Endoscopy;;   BREAST EXCISIONAL BIOPSY Left    benign   BREAST SURGERY Left    left partail mastectomy-cyst  CERVICAL DISC SURGERY  1999   anterior and a posterier cerv fusionx2   CESAREAN SECTION     x2   COLONOSCOPY     COLONOSCOPY WITH PROPOFOL N/A 06/26/2018   Procedure: COLONOSCOPY WITH PROPOFOL;  Surgeon: Danie Binder, MD;  [facility]: [facility];  Service: Endoscopy;  Laterality: N/A;  11:00am   DILATION AND CURETTAGE OF UTERUS     DILITATION & CURRETTAGE/HYSTROSCOPY WITH THERMACHOICE ABLATION  07/10/2012   Procedure: DILATATION & CURETTAGE/HYSTEROSCOPY WITH THERMACHOICE  ABLATION;  Surgeon: [name], MD;  Location: [facility];  Service: Gynecology;  Laterality: N/A;  D5 98m in, 13 ml out, temp 87degree celcius, total therapy time 932m 16sec   ERCP     ESOPHAGOGASTRODUODENOSCOPY (EGD) WITH PROPOFOL N/A 01/07/2020   Procedure: ESOPHAGOGASTRODUODENOSCOPY (EGD) WITH PROPOFOL;  Surgeon: FiDanie BinderMD;  [facility];  Service: Endoscopy;  Laterality: N/A;  2:00pm   EYE SURGERY     HARVEST BONE GRAFT N/A 08/25/2022   Procedure: HARVEST ILIAC BONE GRAFT;  Surgeon: BrMelina SchoolsMD;  [facility] Service: Orthopedics;  Laterality: N/A;   POLYPECTOMY  06/26/2018   Procedure: POLYPECTOMY;  Surgeon: FiDanie BinderMD;  [facility];  Service: Endoscopy;;  colon   TUBAL LIGATION     with last c-section    OB History     Gravida  4   Para  2   Term      Preterm      AB      Living  2      SAB      IAB      Ectopic      Multiple      Live Births  2            Home Medications    Prior to Admission medications   Medication Sig Start Date End Date Taking? Authorizing Provider  cyclobenzaprine (FLEXERIL) 10 MG tablet SMARTSIG:1 Tablet(s) By Mouth 1-3 Times Daily PRN 10/21/22  Yes [provider]  HYDROcodone-acetaminophen (NORCO) 10-325 MG tablet Take 1 tablet by mouth every 6 (six) hours. 10/21/22  Yes [provider]  hydrOXYzine (ATARAX) 25 MG tablet Take 1 tablet (25 mg total) by mouth every 8 (eight) hours as needed. 10/29/22  Yes LaVolney AmericanPA-C  Multiple Vitamin (MULTIVITAMIN) capsule Take 1 capsule by mouth daily.   Yes [provider]  ondansetron (ZOFRAN-ODT) 4 MG disintegrating tablet Take 1 tablet (4 mg total) by mouth every 8 (eight) hours as needed for nausea or vomiting. 10/29/22  Yes LaVolney AmericanPA-C  ALPRAZolam (XDuanne Moron0.5 MG tablet Take 0.5 mg by mouth 3 (three) times daily as needed for anxiety.    [provider]  amitriptyline  (ELAVIL) 50 MG tablet Take 50 mg by mouth at bedtime.    [provider]  bethanechol (URECHOLINE) 5 MG tablet Take 5 mg by mouth 3 (three) times daily.    [provider]  enoxaparin (LOVENOX) 40 MG/0.4ML injection Inject 0.4 mLs (40 mg total) into the skin daily. 10 day supply 1 injection per day 08/25/22   BrMelina SchoolsMD  escitalopram (LEXAPRO) 20 MG tablet Take 20 mg by mouth daily.    [provider]  gabapentin (NEURONTIN) 300 MG capsule Take 300 mg by mouth 3 (three) times daily as needed (pain). 06/08/21   [provider]  iron polysaccharides (NIFEREX) 150 MG capsule Take 150 mg by mouth daily  as needed (fatigue).    [provider]  ondansetron (ZOFRAN) 4 MG tablet Take 1 tablet (4 mg total) by mouth every 8 (eight) hours as needed for nausea or vomiting. 08/25/22   Melina Schools, MD    Family History Family History  Problem Relation Age of Onset   Hypertension Mother    Colon cancer Neg Hx    Gastric cancer Neg Hx    Esophageal cancer Neg Hx     Social History Social History   Tobacco Use   Smoking status: Never   Smokeless tobacco: Never  Vaping Use   Vaping Use: Never used  Substance Use Topics   Alcohol use: No   Drug use: No     Allergies   Patient has no known allergies.   Review of Systems Review of Systems Per HPI  Physical Exam Triage Vital Signs ED Triage Vitals  Enc Vitals Group     BP 10/29/22 0815 108/71     Pulse Rate 10/29/22 0815 (!) 101     Resp 10/29/22 0815 20     Temp 10/29/22 0815 97.9 F (36.6 C)     Temp Source 10/29/22 0815 Oral     SpO2 10/29/22 0815 97 %     Weight --      Height --      Head Circumference --      Peak Flow --      Pain Score 10/29/22 0816 9     Pain Loc --      Pain Edu? --      Excl. in [place]? --    No data found.  Updated Vital Signs BP 108/71 (BP Location: Left Arm)   Pulse (!) 101   Temp 97.9 F (36.6 C) (Oral)   Resp 20   LMP 12/06/2013   SpO2  97%   Visual Acuity Right Eye Distance:   Left Eye Distance:   Bilateral Distance:    Right Eye Near:   Left Eye Near:    Bilateral Near:     Physical Exam Vitals and nursing note reviewed.  Constitutional:      Appearance: Normal appearance. She is not ill-appearing.  HENT:     Head: Atraumatic.     Mouth/Throat:     Mouth: Mucous membranes are moist.  Eyes:     Extraocular Movements: Extraocular movements intact.     Conjunctiva/sclera: Conjunctivae normal.  Cardiovascular:     Rate and Rhythm: Normal rate and regular rhythm.     Heart sounds: Normal heart sounds.  Pulmonary:     Effort: Pulmonary effort is normal.     Breath sounds: Normal breath sounds.  Abdominal:     General: Bowel sounds are normal. There is no distension.     Palpations: Abdomen is soft.     Tenderness: There is abdominal tenderness. There is no right CVA tenderness, left CVA tenderness or guarding.     Comments: Minimal epigastric tenderness to palpation without distention or guarding  Musculoskeletal:        General: Normal range of motion.     Cervical back: Normal range of motion and neck supple.  Skin:    General: Skin is warm and dry.  Neurological:     Mental Status: She is alert and oriented to person, place, and time.  Psychiatric:        Mood and Affect: Mood normal.        Thought Content: Thought content normal.  Judgment: Judgment normal.      UC Treatments / Results  Labs (all labs ordered are listed, but only abnormal results are displayed) Labs Reviewed  POCT URINALYSIS DIP (MANUAL ENTRY) - Abnormal; Notable for the following components:      Result Value   Blood, UA trace-intact (*)    All other components within normal limits  CBC WITH DIFFERENTIAL/PLATELET  COMPREHENSIVE METABOLIC PANEL  LIPASE    EKG   Radiology No results found.  Procedures Procedures (including critical care time)  Medications Ordered in UC Medications - No data to  display  Initial Impression / Assessment and Plan / UC Course  I have reviewed the triage vital signs and the nursing notes.  Pertinent labs & imaging results that were available during my care of the patient were reviewed by me and considered in my medical decision making (see chart for details).     Vital signs and exam reassuring today, urinalysis without evidence of infection or dehydration, labs pending for further rule out.  Do suspect largely anxiety related particularly given her increased stressors at home currently.  Trial hydroxyzine, Zofran both as needed, follow-up with PCP for recheck and further evaluation.  Did recommend counseling to help process her current stressors to see if this helps resolve her abdominal issues.  Final Clinical Impressions(s) / UC Diagnoses   Final diagnoses:  Abdominal pain, epigastric  Anxiety state     Discharge Instructions      You should get your labs back tomorrow and someone will be in touch if there are abnormalities that need to be discussed.  I have sent over hydroxyzine to help with sleep and anxiety and Zofran for nausea as needed.  Follow-up as soon as possible with your primary care provider to recheck your symptoms and discuss further management.    ED Prescriptions     Medication Sig Dispense Auth. Provider   hydrOXYzine (ATARAX) 25 MG tablet Take 1 tablet (25 mg total) by mouth every 8 (eight) hours as needed. 30 tablet Volney American, PA-C   ondansetron (ZOFRAN-ODT) 4 MG disintegrating tablet Take 1 tablet (4 mg total) by mouth every 8 (eight) hours as needed for nausea or vomiting. 20 tablet Volney American, [place]      PDMP not reviewed this encounter.   Merrie Roof [place], [place] 10/29/22 [phone]

## 2022-10-29 NOTE — Discharge Instructions (Addendum)
You should get your labs back tomorrow and someone will be in touch if there are abnormalities that need to be discussed.  I have sent over hydroxyzine to help with sleep and anxiety and Zofran for nausea as needed.  Follow-up as soon as possible with your primary care provider to recheck your symptoms and discuss further management.

## 2022-10-30 LAB — COMPREHENSIVE METABOLIC PANEL
ALT: 14 IU/L (ref 0–32)
AST: 23 IU/L (ref 0–40)
Albumin/Globulin Ratio: 1.7 (ref 1.2–2.2)
Albumin: 4.7 g/dL (ref 3.9–4.9)
Alkaline Phosphatase: 109 IU/L (ref 44–121)
BUN/Creatinine Ratio: 19 (ref 12–28)
BUN: 14 mg/dL (ref 8–27)
Bilirubin Total: 0.4 mg/dL (ref 0.0–1.2)
CO2: 24 mmol/L (ref 20–29)
Calcium: 10 mg/dL (ref 8.7–10.3)
Chloride: 100 mmol/L (ref 96–106)
Creatinine, Ser: 0.73 mg/dL (ref 0.57–1.00)
Globulin, Total: 2.8 g/dL (ref 1.5–4.5)
Glucose: 107 mg/dL — ABNORMAL HIGH (ref 70–99)
Potassium: 3.9 mmol/L (ref 3.5–5.2)
Sodium: 140 mmol/L (ref 134–144)
Total Protein: 7.5 g/dL (ref 6.0–8.5)
eGFR: 93 mL/min/{1.73_m2} (ref >59)

## 2022-10-30 LAB — CBC WITH DIFFERENTIAL/PLATELET
Basophils Absolute: 0.1 10*3/uL (ref 0.0–0.2)
Basos: 1 %
EOS (ABSOLUTE): 0 10*3/uL (ref 0.0–0.4)
Eos: 1 %
Hematocrit: 39.9 % (ref 34.0–46.6)
Hemoglobin: 13.4 g/dL (ref 11.1–15.9)
Immature Grans (Abs): 0 10*3/uL (ref 0.0–0.1)
Immature Granulocytes: 0 %
Lymphocytes Absolute: 1.2 10*3/uL (ref 0.7–3.1)
Lymphs: 27 %
MCH: 31.1 pg (ref 26.6–33.0)
MCHC: 33.6 g/dL (ref 31.5–35.7)
MCV: 93 fL (ref 79–97)
Monocytes Absolute: 0.4 10*3/uL (ref 0.1–0.9)
Monocytes: 8 %
Neutrophils Absolute: 2.9 10*3/uL (ref 1.4–7.0)
Neutrophils: 63 %
Platelets: 313 10*3/uL (ref 150–450)
RBC: 4.31 x10E6/uL (ref 3.77–5.28)
RDW: 12.9 % (ref 11.7–15.4)
WBC: 4.6 10*3/uL (ref 3.4–10.8)

## 2022-10-30 LAB — LIPASE: Lipase: 14 U/L (ref 14–72)

## 2022-11-03 ENCOUNTER — Encounter: Payer: Self-pay | Admitting: Radiology

## 2022-11-16 DIAGNOSIS — Z4889 Encounter for other specified surgical aftercare: Secondary | ICD-10-CM | POA: Diagnosis not present

## 2022-11-29 ENCOUNTER — Emergency Department (HOSPITAL_COMMUNITY)
Admission: EM | Admit: 2022-11-29 | Discharge: 2022-11-29 | Disposition: A | Discharge status: Home / Self Care | Payer: Medicare HMO | Attending: Emergency Medicine | Admitting: Emergency Medicine

## 2022-11-29 ENCOUNTER — Encounter (HOSPITAL_COMMUNITY): Payer: Self-pay | Admitting: Emergency Medicine

## 2022-11-29 ENCOUNTER — Other Ambulatory Visit: Payer: Self-pay

## 2022-11-29 DIAGNOSIS — R109 Unspecified abdominal pain: Secondary | ICD-10-CM | POA: Insufficient documentation

## 2022-11-29 DIAGNOSIS — K5900 Constipation, unspecified: Secondary | ICD-10-CM | POA: Diagnosis not present

## 2022-11-29 DIAGNOSIS — R197 Diarrhea, unspecified: Secondary | ICD-10-CM | POA: Insufficient documentation

## 2022-11-29 DIAGNOSIS — Z6823 Body mass index (BMI) 23.0-23.9, adult: Secondary | ICD-10-CM | POA: Diagnosis not present

## 2022-11-29 DIAGNOSIS — R195 Other fecal abnormalities: Secondary | ICD-10-CM | POA: Insufficient documentation

## 2022-11-29 DIAGNOSIS — Z7901 Long term (current) use of anticoagulants: Secondary | ICD-10-CM | POA: Diagnosis not present

## 2022-11-29 DIAGNOSIS — K921 Melena: Secondary | ICD-10-CM | POA: Diagnosis not present

## 2022-11-29 LAB — URINALYSIS, ROUTINE W REFLEX MICROSCOPIC
Bilirubin Urine: NEGATIVE
Glucose, UA: NEGATIVE mg/dL
Hgb urine dipstick: NEGATIVE
Ketones, ur: NEGATIVE mg/dL
Leukocytes,Ua: NEGATIVE
Nitrite: NEGATIVE
Protein, ur: NEGATIVE mg/dL
Specific Gravity, Urine: 1.017 (ref 1.005–1.030)
pH: 6 (ref 5.0–8.0)

## 2022-11-29 LAB — CBC
HCT: 39.7 % (ref 36.0–46.0)
Hemoglobin: 12.5 g/dL (ref 12.0–15.0)
MCH: 29.9 pg (ref 26.0–34.0)
MCHC: 31.5 g/dL (ref 30.0–36.0)
MCV: 95 fL (ref 80.0–100.0)
Platelets: 305 10*3/uL (ref 150–400)
RBC: 4.18 MIL/uL (ref 3.87–5.11)
RDW: 14 % (ref 11.5–15.5)
WBC: 4.7 10*3/uL (ref 4.0–10.5)
nRBC: 0 % (ref 0.0–0.2)

## 2022-11-29 LAB — COMPREHENSIVE METABOLIC PANEL
ALT: 15 U/L (ref 0–44)
AST: 24 U/L (ref 15–41)
Albumin: 4 g/dL (ref 3.5–5.0)
Alkaline Phosphatase: 77 U/L (ref 38–126)
Anion gap: 10 (ref 5–15)
BUN: 12 mg/dL (ref 8–23)
CO2: 26 mmol/L (ref 22–32)
Calcium: 9.4 mg/dL (ref 8.9–10.3)
Chloride: 103 mmol/L (ref 98–111)
Creatinine, Ser: 0.61 mg/dL (ref 0.44–1.00)
GFR, Estimated: >60 mL/min (ref >60)
Glucose, Bld: 106 mg/dL — ABNORMAL HIGH (ref 70–99)
Potassium: 3.7 mmol/L (ref 3.5–5.1)
Sodium: 139 mmol/L (ref 135–145)
Total Bilirubin: 0.6 mg/dL (ref 0.3–1.2)
Total Protein: 7.7 g/dL (ref 6.5–8.1)

## 2022-11-29 LAB — LIPASE, BLOOD: Lipase: 29 U/L (ref 11–51)

## 2022-11-29 LAB — POC OCCULT BLOOD, ED: Fecal Occult Bld: NEGATIVE

## 2022-11-29 NOTE — Discharge Instructions (Signed)
Stop taking Pepto-bismol If diarrhea occurs again, make sure to drink plenty of fluids

## 2022-11-29 NOTE — ED Triage Notes (Signed)
Pt states she had diarrhea/cramping last week. Pt states she saw bright red blood- and thought it could be because she was straining. Symptoms started again yesterday. Pt states her stool is black this time. Denies dizziness/lightheadedness. Pt states she is on iron supplements.

## 2022-11-29 NOTE — ED Provider Notes (Signed)
[facility] Provider Note   CSN: [id number] Arrival date & time: 11/29/22  1001     History  Chief Complaint  Patient presents with   Abdominal Pain     [name] is a 62 y.o. female with past medical history of constipation and hemorrhoids who presents to the emergency department complaining of cramping abdominal pain and melena. No abdominal pain currently in ED. Patient also endorses scant "strings" of blood when she strains during bowel movements. She denies fever, chills, body aches, dizziness, weakness, nausea, vomiting, recent travel, and sick contacts. She endorses abdominal pain and diarrhea x3 last week and once yesterday. Diarrhea has been relieved by Pepto-bismol. Melena started after diarrheal episodes last week.    Abdominal Pain Associated symptoms: constipation and diarrhea   Associated symptoms: no chills, no dysuria, no fever, no nausea and no vomiting        Home Medications Prior to Admission medications   Medication Sig Start Date End Date Taking? Authorizing Provider  ALPRAZolam Duanne Moron) 0.5 MG tablet Take 0.5 mg by mouth 3 (three) times daily as needed for anxiety.    [provider]  amitriptyline (ELAVIL) 50 MG tablet Take 50 mg by mouth at bedtime.    [provider]  bethanechol (URECHOLINE) 5 MG tablet Take 5 mg by mouth 3 (three) times daily.    [provider]  cyclobenzaprine (FLEXERIL) 10 MG tablet SMARTSIG:1 Tablet(s) By Mouth 1-3 Times Daily PRN 10/21/22   [provider]  enoxaparin (LOVENOX) 40 MG/0.4ML injection Inject 0.4 mLs (40 mg total) into the skin daily. 10 day supply 1 injection per day 08/25/22   Melina Schools, MD  escitalopram (LEXAPRO) 20 MG tablet Take 20 mg by mouth daily.    [provider]  gabapentin (NEURONTIN) 300 MG capsule Take 300 mg by mouth 3 (three) times daily as needed (pain). 06/08/21   [provider]   HYDROcodone-acetaminophen (NORCO) 10-325 MG tablet Take 1 tablet by mouth every 6 (six) hours. 10/21/22   [provider]  hydrOXYzine (ATARAX) 25 MG tablet Take 1 tablet (25 mg total) by mouth every 8 (eight) hours as needed. 10/29/22   Volney American, PA-C  iron polysaccharides (NIFEREX) 150 MG capsule Take 150 mg by mouth daily as needed (fatigue).    [provider]  Multiple Vitamin (MULTIVITAMIN) capsule Take 1 capsule by mouth daily.    [provider]  ondansetron (ZOFRAN) 4 MG tablet Take 1 tablet (4 mg total) by mouth every 8 (eight) hours as needed for nausea or vomiting. 08/25/22   Melina Schools, MD  ondansetron (ZOFRAN-ODT) 4 MG disintegrating tablet Take 1 tablet (4 mg total) by mouth every 8 (eight) hours as needed for nausea or vomiting. 10/29/22   Volney American, PA-C      Allergies    Patient has no known allergies.    Review of Systems   Review of Systems  Constitutional:  Negative for chills and fever.  Cardiovascular:  Negative for palpitations.  Gastrointestinal:  Positive for abdominal pain, blood in stool, constipation and diarrhea. Negative for nausea, rectal pain and vomiting.  Genitourinary:  Negative for dysuria and urgency.  Neurological:  Negative for dizziness and weakness.  Psychiatric/Behavioral:  Negative for confusion.     Physical Exam Updated Vital Signs BP 137/85 (BP Location: Right Arm)   Pulse 97   Temp (!) 97.3 F (36.3 C) (Oral)   Resp 16   LMP 12/06/2013  SpO2 99%  Physical Exam Vitals reviewed. Exam conducted with a chaperone present.  Constitutional:      General: She is not in acute distress.    Appearance: She is well-developed and normal weight. She is not ill-appearing or toxic-appearing.  HENT:     Head: Normocephalic and atraumatic.  Eyes:     Extraocular Movements: Extraocular movements intact.  Cardiovascular:     Rate and Rhythm: Normal rate and regular rhythm.  Pulmonary:      Effort: Pulmonary effort is normal.     Breath sounds: Normal breath sounds.  Abdominal:     General: Abdomen is flat. Bowel sounds are normal. There is no distension.     Palpations: Abdomen is soft. There is no mass.     Tenderness: There is no abdominal tenderness.  Genitourinary:    Rectum: No mass or tenderness.     Comments: Occult blood exam with no obvious blood. Chaperone present. Skin:    General: Skin is warm and dry.  Neurological:     Mental Status: She is alert and oriented to person, place, and time.  Psychiatric:        Mood and Affect: Mood normal.        Behavior: Behavior normal.     ED Results / Procedures / Treatments   Labs (all labs ordered are listed, but only abnormal results are displayed) Labs Reviewed  COMPREHENSIVE METABOLIC PANEL - Abnormal; Notable for the following components:      Result Value   Glucose, Bld 106 (*)    All other components within normal limits  LIPASE, BLOOD  CBC  URINALYSIS, ROUTINE W REFLEX MICROSCOPIC  POC OCCULT BLOOD, ED    EKG None  Radiology No results found.  Procedures Procedures    Medications Ordered in ED Medications - No data to display  ED Course/ Medical Decision Making/ A&P                             Medical Decision Making  Patient complains of abdominal cramping and diarrhea x3 days last week. Patient used pepto-bismol for symptom relief for multiple days. Patient then reported melena since having diarrhea. Diarrhea occurred again yesterday, and continued to be accompanied by melena. Patient did not have bowel movements in between diarrheal episodes. Patient's doctor advised that she go to the ED.   Upon arrival to ED, patient does not report ongoing abdominal pain. No tenderness to palpation of abdomen. Stool Occult sample via rectal exam was obtained with chaperone and was negative. CBC showed no anemia (HCT 39.7), lipase within normal limits (29), and CMP within normal limits. Vital signs  without fever or hypotension.  Lack of fever and abdominal pain makes me less concerned for diverticulosis. History of trace blood in strained bowel movements seem to be due to her past history of hemorrhoids. Although patient had abdominal surgery in December 2023, absence of dizziness, lightheadedness, weakness, anemia and negative occult stool test makes me less concerned of GI bleed. Patient agreed to follow up with her PCP and stop taking Pepto-bismol in the meantime to see if melena symptoms resolve. Patient educated to come back to emergency room if she feels symptoms of anemia (dizziness, weakness, lightheadedness).    Amount and/or Complexity of Data Reviewed Labs: ordered.           Final Clinical Impression(s) / ED Diagnoses Final diagnoses:  Dark stools    Rx / DC  Orders ED Discharge Orders     None         Valente David, [place] 11/29/22 [place], [place], [place] 11/29/22 1445

## 2022-12-09 DIAGNOSIS — F339 Major depressive disorder, recurrent, unspecified: Secondary | ICD-10-CM | POA: Diagnosis not present

## 2022-12-09 DIAGNOSIS — I10 Essential (primary) hypertension: Secondary | ICD-10-CM | POA: Diagnosis not present

## 2022-12-09 DIAGNOSIS — F320 Major depressive disorder, single episode, mild: Secondary | ICD-10-CM | POA: Diagnosis not present

## 2022-12-09 DIAGNOSIS — F419 Anxiety disorder, unspecified: Secondary | ICD-10-CM | POA: Diagnosis not present

## 2022-12-09 DIAGNOSIS — K625 Hemorrhage of anus and rectum: Secondary | ICD-10-CM | POA: Diagnosis not present

## 2022-12-09 DIAGNOSIS — K648 Other hemorrhoids: Secondary | ICD-10-CM | POA: Diagnosis not present

## 2022-12-09 DIAGNOSIS — R109 Unspecified abdominal pain: Secondary | ICD-10-CM | POA: Diagnosis not present

## 2022-12-09 DIAGNOSIS — K219 Gastro-esophageal reflux disease without esophagitis: Secondary | ICD-10-CM | POA: Diagnosis not present

## 2022-12-09 DIAGNOSIS — C50211 Malignant neoplasm of upper-inner quadrant of right female breast: Secondary | ICD-10-CM | POA: Diagnosis not present

## 2022-12-15 DIAGNOSIS — M25552 Pain in left hip: Secondary | ICD-10-CM | POA: Diagnosis not present

## 2022-12-15 DIAGNOSIS — M7918 Myalgia, other site: Secondary | ICD-10-CM | POA: Diagnosis not present

## 2022-12-16 ENCOUNTER — Encounter: Payer: Self-pay | Admitting: *Deleted

## 2023-01-08 NOTE — Progress Notes (Unsigned)
GI Office Note    Referring Provider: Elfredia Nevins, MD Primary Care Physician:  Elfredia Nevins, MD  Primary Gastroenterologist:  Chief Complaint   No chief complaint on file.    History of Present Illness   [name] is a 62 y.o. female presenting today at the request of Dr. Sherwood Gambler for rectal bleeding.    Seen in the ED November 29, 2022 for abdominal pain, melena, blood in the stool.  Had diarrhea the week prior, took Pepto-Bismol.  Stool was Hemoccult negative.  White blood cell count 4700, hemoglobin 12.5,MCV 95, platelets 305,000, BUN 12, creatinine 0.61, LFTs normal, lipase 29.  EGD May 2021: -Mild gastritis, chronic inactive gastritis (possibly due to meloxicam), no H. pylori -Small bowel biopsy negative  Colonoscopy October 2019: -two 4 to 5 mm polyps in the ascending colon -Redundant colon -Small external hemorrhoids -1 simple adenoma on pathology, next colonoscopy between 2024 in 2026     Medications   Current Outpatient Medications  Medication Sig Dispense Refill   ALPRAZolam (XANAX) 0.5 MG tablet Take 0.5 mg by mouth 3 (three) times daily as needed for anxiety.     amitriptyline (ELAVIL) 50 MG tablet Take 50 mg by mouth at bedtime.     bethanechol (URECHOLINE) 5 MG tablet Take 5 mg by mouth 3 (three) times daily.     cyclobenzaprine (FLEXERIL) 10 MG tablet SMARTSIG:1 Tablet(s) By Mouth 1-3 Times Daily PRN     enoxaparin (LOVENOX) 40 MG/0.4ML injection Inject 0.4 mLs (40 mg total) into the skin daily. 10 day supply 1 injection per day 4 mL 0   escitalopram (LEXAPRO) 20 MG tablet Take 20 mg by mouth daily.     gabapentin (NEURONTIN) 300 MG capsule Take 300 mg by mouth 3 (three) times daily as needed (pain).     HYDROcodone-acetaminophen (NORCO) 10-325 MG tablet Take 1 tablet by mouth every 6 (six) hours.     hydrOXYzine (ATARAX) 25 MG tablet Take 1 tablet (25 mg total) by mouth every 8 (eight) hours as needed. 30 tablet 0   iron polysaccharides (NIFEREX)  150 MG capsule Take 150 mg by mouth daily as needed (fatigue).     Multiple Vitamin (MULTIVITAMIN) capsule Take 1 capsule by mouth daily.     ondansetron (ZOFRAN) 4 MG tablet Take 1 tablet (4 mg total) by mouth every 8 (eight) hours as needed for nausea or vomiting. 20 tablet 0   ondansetron (ZOFRAN-ODT) 4 MG disintegrating tablet Take 1 tablet (4 mg total) by mouth every 8 (eight) hours as needed for nausea or vomiting. 20 tablet 0   No current facility-administered medications for this visit.    Allergies   Allergies as of 01/09/2023   (No Known Allergies)    Past Medical History   Past Medical History:  Diagnosis Date   Anemia    Arthritis    Breast cancer (HCC)    right breast cancer 5 yrs ago   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   APPENDIX IN LUQ   Depression    Dysrhythmia    hx palpitations-too much caffiene   Headache    patient denies this dx   Scoliosis     Past Surgical History   Past Surgical History:  Procedure Laterality Date   ABDOMINAL EXPOSURE N/A 08/25/2022   Procedure: ABDOMINAL EXPOSURE;  Surgeon: Cephus Shelling, MD;  Location: [facility] OR;  Service: Vascular;  Laterality: N/A;   ANKLE SURGERY     left ankle cyst  ANTERIOR LUMBAR FUSION N/A 08/25/2022   Procedure: ANTERIOR LUMBAR FOUR TO FIVE INTERBODY FUSION, ILIAC CRST BONE GRAFT HARVEST;  Surgeon: Venita Lick, MD;  Location: [facility] OR;  Service: Orthopedics;  Laterality: N/A;  4 HRS DR. CLARK TO DO APPROACH 3 C-BED   BACK SURGERY  2000   lumbar disc surgery   BIOPSY  01/07/2020   Procedure: BIOPSY;  Surgeon: West Bali, MD;  Location: [facility] ENDO SUITE;  Service: Endoscopy;;   BREAST EXCISIONAL BIOPSY Left    benign   BREAST SURGERY Left    left partail mastectomy-cyst   CERVICAL DISC SURGERY  1999   anterior and a posterier cerv fusionx2   CESAREAN SECTION     x2   COLONOSCOPY     COLONOSCOPY WITH PROPOFOL N/A 06/26/2018   Procedure: COLONOSCOPY WITH PROPOFOL;  Surgeon:  West Bali, MD;  Location: [facility];  Service: Endoscopy;  Laterality: N/A;  11:00am   DILATION AND CURETTAGE OF UTERUS     DILITATION & CURRETTAGE/HYSTROSCOPY WITH THERMACHOICE ABLATION  07/10/2012   Procedure: DILATATION & CURETTAGE/HYSTEROSCOPY WITH THERMACHOICE ABLATION;  Surgeon: [name], MD;  Location: [facility] ORS;  Service: Gynecology;  Laterality: N/A;  D5 13ml in, 13 ml out, temp 87degree celcius, total therapy time 16sec   ERCP     ESOPHAGOGASTRODUODENOSCOPY (EGD) WITH PROPOFOL N/A 01/07/2020   Procedure: ESOPHAGOGASTRODUODENOSCOPY (EGD) WITH PROPOFOL;  Surgeon: West Bali, MD;  Location: [facility];  Service: Endoscopy;  Laterality: N/A;  2:00pm   EYE SURGERY     HARVEST BONE GRAFT N/A 08/25/2022   Procedure: HARVEST ILIAC BONE GRAFT;  Surgeon: Venita Lick, MD;  Location: [facility] OR;  Service: Orthopedics;  Laterality: N/A;   POLYPECTOMY  06/26/2018   Procedure: POLYPECTOMY;  Surgeon: West Bali, MD;  Location: [facility];  Service: Endoscopy;;  colon   TUBAL LIGATION     with last c-section    Past Family History   Family History  Problem Relation Age of Onset   Hypertension Mother    Colon cancer Neg Hx    Gastric cancer Neg Hx    Esophageal cancer Neg Hx     Past Social History   Social History   Socioeconomic History   Marital status: Married    Spouse name: Not on file   Number of children: 2   Years of education: Not on file   Highest education level: Not on file  Occupational History   Not on file  Tobacco Use   Smoking status: Never   Smokeless tobacco: Never  Vaping Use   Vaping Use: Never used  Substance and Sexual Activity   Alcohol use: No   Drug use: No   Sexual activity: Yes    Birth control/protection: Surgical, Post-menopausal    Comment: tubal ligation & ablation  Other Topics Concern   Not on file  Social History Narrative   Not on file   Social Determinants of Health   Financial Resource Strain:  Medium Risk (08/08/2022)   Overall Financial Resource Strain (CARDIA)    Difficulty of Paying Living Expenses: Somewhat hard  Food Insecurity: Food Insecurity Present (08/08/2022)   Hunger Vital Sign    Worried About Running Out of Food in the Last Year: Sometimes true    Ran Out of Food in the Last Year: Often true  Transportation Needs: No Transportation Needs (08/08/2022)   PRAPARE - Administrator, Civil Service (Medical): No  Lack of Transportation (Non-Medical): No  Physical Activity: Inactive (08/08/2022)   Exercise Vital Sign    Days of Exercise per Week: 0 days    Minutes of Exercise per Session: 0 min  Stress: No Stress Concern Present (08/08/2022)   [employer] of Occupational Health - Occupational Stress Questionnaire    Feeling of Stress : Not at all  Social Connections: Moderately Integrated (08/08/2022)   Social Connection and Isolation Panel [NHANES]    Frequency of Communication with Friends and Family: More than three times a week    Frequency of Social Gatherings with Friends and Family: Three times a week    Attends Religious Services: 1 to 4 times per year    Active Member of Clubs or Organizations: No    Attends Banker Meetings: Never    Marital Status: Married  Catering manager Violence: Not At Risk (08/08/2022)   Humiliation, Afraid, Rape, and Kick questionnaire    Fear of Current or Ex-Partner: No    Emotionally Abused: No    Physically Abused: No    Sexually Abused: No    Review of Systems   General: Negative for anorexia, weight loss, fever, chills, fatigue, weakness. Eyes: Negative for vision changes.  ENT: Negative for hoarseness, difficulty swallowing , nasal congestion. CV: Negative for chest pain, angina, palpitations, dyspnea on exertion, peripheral edema.  Respiratory: Negative for dyspnea at rest, dyspnea on exertion, cough, sputum, wheezing.  GI: See history of present illness. GU:  Negative for dysuria,  hematuria, urinary incontinence, urinary frequency, nocturnal urination.  MS: Negative for joint pain, low back pain.  Derm: Negative for rash or itching.  Neuro: Negative for weakness, abnormal sensation, seizure, frequent headaches, memory loss,  confusion.  Psych: Negative for anxiety, depression, suicidal ideation, hallucinations.  Endo: Negative for unusual weight change.  Heme: Negative for bruising or bleeding. Allergy: Negative for rash or hives.  Physical Exam   LMP 12/06/2013    General: Well-nourished, well-developed in no acute distress.  Head: Normocephalic, atraumatic.   Eyes: Conjunctiva pink, no icterus. Mouth: Oropharyngeal mucosa moist and pink , no lesions erythema or exudate. Neck: Supple without thyromegaly, masses, or lymphadenopathy.  Lungs: Clear to auscultation bilaterally.  Heart: Regular rate and rhythm, no murmurs rubs or gallops.  Abdomen: Bowel sounds are normal, nontender, nondistended, no hepatosplenomegaly or masses,  no abdominal bruits or hernia, no rebound or guarding.   Rectal: *** Extremities: No lower extremity edema. No clubbing or deformities.  Neuro: Alert and oriented x 4 , grossly normal neurologically.  Skin: Warm and dry, no rash or jaundice.   Psych: Alert and cooperative, normal mood and affect.  Labs   *** Imaging Studies   No results found.  Assessment       PLAN   ***   Leanna Battles. Melvyn Neth, MHS, PA-C [facility]

## 2023-01-09 ENCOUNTER — Encounter: Payer: Self-pay | Admitting: *Deleted

## 2023-01-09 ENCOUNTER — Telehealth: Payer: Self-pay | Admitting: *Deleted

## 2023-01-09 ENCOUNTER — Other Ambulatory Visit: Payer: Self-pay | Admitting: Internal Medicine

## 2023-01-09 ENCOUNTER — Other Ambulatory Visit: Payer: Self-pay | Admitting: *Deleted

## 2023-01-09 ENCOUNTER — Ambulatory Visit: Payer: Medicare HMO | Admitting: Gastroenterology

## 2023-01-09 ENCOUNTER — Encounter: Payer: Self-pay | Admitting: Gastroenterology

## 2023-01-09 VITALS — BP 128/82 | HR 102 | Temp 97.9°F | Ht 67.0 in | Wt 141.2 lb

## 2023-01-09 DIAGNOSIS — K625 Hemorrhage of anus and rectum: Secondary | ICD-10-CM

## 2023-01-09 DIAGNOSIS — Z86010 Personal history of colonic polyps: Secondary | ICD-10-CM

## 2023-01-09 DIAGNOSIS — K219 Gastro-esophageal reflux disease without esophagitis: Secondary | ICD-10-CM | POA: Diagnosis not present

## 2023-01-09 DIAGNOSIS — R1013 Epigastric pain: Secondary | ICD-10-CM | POA: Diagnosis not present

## 2023-01-09 DIAGNOSIS — K5900 Constipation, unspecified: Secondary | ICD-10-CM | POA: Diagnosis not present

## 2023-01-09 MED ORDER — LUBIPROSTONE 24 MCG PO CAPS: 24.0000 ug | ORAL_CAPSULE | Freq: Two times a day (BID) | ORAL | 5 refills | Status: DC

## 2023-01-09 MED ORDER — PEG 3350-KCL-NA BICARB-NACL 420 G PO SOLR: 4000.0000 mL | Freq: Once | ORAL | 0 refills | Status: AC

## 2023-01-09 NOTE — Patient Instructions (Addendum)
Start back on omeprazole 20mg  once daily before breakfast. RX sent to pharmacy. Looks like insurance pays for [employer] for constipation. I have sent in RX, if it is not effective, we can try to get one of the other options covered.  Colonoscopy and upper endoscopy to be scheduled.  Do not use Pepto as this can turn stools black and make constipation worse.

## 2023-01-09 NOTE — Telephone Encounter (Signed)
PA Submitted via cohere for TCS/EGD.  Authorization #[id number], DOS: 02/07/2023 - 04/09/2023

## 2023-01-10 ENCOUNTER — Encounter: Payer: Self-pay | Admitting: Gastroenterology

## 2023-01-10 DIAGNOSIS — M5416 Radiculopathy, lumbar region: Secondary | ICD-10-CM | POA: Diagnosis not present

## 2023-01-12 DIAGNOSIS — M5416 Radiculopathy, lumbar region: Secondary | ICD-10-CM | POA: Diagnosis not present

## 2023-02-02 DIAGNOSIS — M5416 Radiculopathy, lumbar region: Secondary | ICD-10-CM | POA: Diagnosis not present

## 2023-02-02 DIAGNOSIS — M961 Postlaminectomy syndrome, not elsewhere classified: Secondary | ICD-10-CM | POA: Diagnosis not present

## 2023-02-03 DIAGNOSIS — M5459 Other low back pain: Secondary | ICD-10-CM | POA: Diagnosis not present

## 2023-02-06 ENCOUNTER — Telehealth: Payer: Self-pay | Admitting: *Deleted

## 2023-02-06 NOTE — Telephone Encounter (Signed)
Patient called in. She stated she is not able to read. Husband "forgot" to tell her to start clear liquid diet after lunch yesterday. She had oatmeal this AM at 10:30am. Spoke with Dr. Marletta Lor and if patient wishes she can proceed but if not cleaned out will have to repeat.   Pt aware and she still wants to proceed. I discussed with pt in length regarding instructions for prep. She voiced understanding.

## 2023-02-07 ENCOUNTER — Encounter (HOSPITAL_COMMUNITY): Payer: Self-pay

## 2023-02-07 ENCOUNTER — Encounter (HOSPITAL_COMMUNITY)
Admission: RE | Disposition: A | Discharge status: Home / Self Care | Payer: Self-pay | Source: Home / Self Care | Attending: Internal Medicine

## 2023-02-07 ENCOUNTER — Ambulatory Visit (HOSPITAL_BASED_OUTPATIENT_CLINIC_OR_DEPARTMENT_OTHER): Payer: Medicare HMO | Admitting: Certified Registered"

## 2023-02-07 ENCOUNTER — Other Ambulatory Visit: Payer: Self-pay

## 2023-02-07 ENCOUNTER — Ambulatory Visit (HOSPITAL_COMMUNITY)
Admission: RE | Admit: 2023-02-07 | Discharge: 2023-02-07 | Disposition: A | Discharge status: Home / Self Care | Payer: Medicare HMO | Attending: Internal Medicine | Admitting: Internal Medicine

## 2023-02-07 ENCOUNTER — Ambulatory Visit (HOSPITAL_COMMUNITY): Payer: Medicare HMO | Admitting: Certified Registered"

## 2023-02-07 DIAGNOSIS — K2950 Unspecified chronic gastritis without bleeding: Secondary | ICD-10-CM | POA: Insufficient documentation

## 2023-02-07 DIAGNOSIS — D759 Disease of blood and blood-forming organs, unspecified: Secondary | ICD-10-CM | POA: Insufficient documentation

## 2023-02-07 DIAGNOSIS — K649 Unspecified hemorrhoids: Secondary | ICD-10-CM

## 2023-02-07 DIAGNOSIS — F418 Other specified anxiety disorders: Secondary | ICD-10-CM | POA: Insufficient documentation

## 2023-02-07 DIAGNOSIS — R1013 Epigastric pain: Secondary | ICD-10-CM | POA: Diagnosis not present

## 2023-02-07 DIAGNOSIS — K2990 Gastroduodenitis, unspecified, without bleeding: Secondary | ICD-10-CM | POA: Diagnosis not present

## 2023-02-07 DIAGNOSIS — F32A Depression, unspecified: Secondary | ICD-10-CM | POA: Insufficient documentation

## 2023-02-07 DIAGNOSIS — K2970 Gastritis, unspecified, without bleeding: Secondary | ICD-10-CM | POA: Diagnosis not present

## 2023-02-07 DIAGNOSIS — K219 Gastro-esophageal reflux disease without esophagitis: Secondary | ICD-10-CM | POA: Insufficient documentation

## 2023-02-07 DIAGNOSIS — K648 Other hemorrhoids: Secondary | ICD-10-CM | POA: Diagnosis not present

## 2023-02-07 DIAGNOSIS — K625 Hemorrhage of anus and rectum: Secondary | ICD-10-CM | POA: Insufficient documentation

## 2023-02-07 DIAGNOSIS — D649 Anemia, unspecified: Secondary | ICD-10-CM | POA: Diagnosis not present

## 2023-02-07 HISTORY — PX: BIOPSY: SHX5522

## 2023-02-07 HISTORY — PX: COLONOSCOPY WITH PROPOFOL: SHX5780

## 2023-02-07 HISTORY — PX: ESOPHAGOGASTRODUODENOSCOPY (EGD) WITH PROPOFOL: SHX5813

## 2023-02-07 SURGERY — COLONOSCOPY WITH PROPOFOL
Anesthesia: General

## 2023-02-07 MED ORDER — PROPOFOL 10 MG/ML IV BOLUS
INTRAVENOUS | Status: DC | PRN
  Administered 2023-02-07: 50 mg via INTRAVENOUS
  Administered 2023-02-07: 100 mg via INTRAVENOUS

## 2023-02-07 MED ORDER — LIDOCAINE HCL (CARDIAC) PF 100 MG/5ML IV SOSY
PREFILLED_SYRINGE | INTRAVENOUS | Status: DC | PRN
  Administered 2023-02-07: 50 mg via INTRAVENOUS

## 2023-02-07 MED ORDER — LACTATED RINGERS IV SOLN: INTRAVENOUS | Status: DC | PRN

## 2023-02-07 MED ORDER — LACTATED RINGERS IV SOLN: INTRAVENOUS | Status: DC

## 2023-02-07 MED ORDER — PROPOFOL 500 MG/50ML IV EMUL
INTRAVENOUS | Status: DC | PRN
  Administered 2023-02-07: 150 ug/kg/min via INTRAVENOUS

## 2023-02-07 NOTE — Op Note (Signed)
[facility] Patient Name: [name] Procedure Date: 02/07/2023 12:22 PM MRN: [id number] Date of Birth: 09/28/1960 Attending MD: Hennie Duos. Marletta Lor , [place], [id number] CSN: [id number] Age: 62 Admit Type: Outpatient Procedure:                Upper GI endoscopy Indications:              Epigastric abdominal pain, Heartburn Providers:                Hennie Duos. Marletta Lor, DO, Sheran Fava, Lennice Sites Technician, Technician Referring MD:              Medicines:                See the Anesthesia note for documentation of the                            administered medications Complications:            No immediate complications. Estimated Blood Loss:     Estimated blood loss was minimal. Procedure:                Pre-Anesthesia Assessment:                           - The anesthesia plan was to use monitored                            anesthesia care (MAC).                           After obtaining informed consent, the endoscope was                            passed under direct vision. Throughout the                            procedure, the patient's blood pressure, pulse, and                            oxygen saturations were monitored continuously. The                            [id number]-[id number] ([id number]) scope was introduced through the                            mouth, and advanced to the second part of duodenum.                            The upper GI endoscopy was accomplished without                            difficulty. The patient tolerated the procedure                            well. Scope In:  12:41:10 PM Scope Out: 12:44:33 PM Total Procedure Duration: 0 hours 3 minutes 23 seconds  Findings:      The Z-line was regular and was found 35 cm from the incisors.      Patchy minimal inflammation characterized by erythema was found in the       gastric body and in the gastric antrum. Biopsies were taken with a cold       forceps for Helicobacter pylori  testing.      The duodenal bulb, first portion of the duodenum and second portion of       the duodenum were normal. Impression:               - Z-line regular, 35 cm from the incisors.                           - Gastritis. Biopsied.                           - Normal duodenal bulb, first portion of the                            duodenum and second portion of the duodenum. Moderate Sedation:      Per Anesthesia Care Recommendation:           - Patient has a contact number available for                            emergencies. The signs and symptoms of potential                            delayed complications were discussed with the                            patient. Return to normal activities tomorrow.                            Written discharge instructions were provided to the                            patient.                           - Resume previous diet.                           - Continue present medications.                           - Await pathology results.                           - Use Prilosec (omeprazole) 20 mg PO daily.                           - Return to GI clinic in 3 months. Procedure Code(s):        --- Professional ---  16109, Esophagogastroduodenoscopy, flexible,                            transoral; with biopsy, single or multiple Diagnosis Code(s):        --- Professional ---                           K29.70, Gastritis, unspecified, without bleeding                           R10.13, Epigastric pain                           R12, Heartburn CPT copyright 2022 American Medical Association. All rights reserved. The codes documented in this report are preliminary and upon coder review may  be revised to meet current compliance requirements. Hennie Duos. Marletta Lor, DO Hennie Duos. Marletta Lor, DO 02/07/2023 12:46:33 PM This report has been signed electronically. Number of Addenda: 0

## 2023-02-07 NOTE — Transfer of Care (Signed)
Immediate Anesthesia Transfer of Care Note  Patient: [name]  Procedure(s) Performed: COLONOSCOPY WITH PROPOFOL ESOPHAGOGASTRODUODENOSCOPY (EGD) WITH PROPOFOL BIOPSY  Patient Location: Endoscopy Unit  Anesthesia Type:General  Level of Consciousness: awake  Airway & Oxygen Therapy: Patient Spontanous Breathing  Post-op Assessment: Report given to RN and Post -op Vital signs reviewed and stable  Post vital signs: Reviewed and stable  Last Vitals:  Vitals Value Taken Time  BP 94/73 02/07/23 1311  Temp 36.7 C 02/07/23 1311  Pulse 83 02/07/23 1311  Resp 16 02/07/23 1311  SpO2 100 % 02/07/23 1311    Last Pain:  Vitals:   02/07/23 1311  TempSrc: Oral  PainSc: 5       Patients Stated Pain Goal: 9 (02/07/23 1131)  Complications: No notable events documented.

## 2023-02-07 NOTE — Discharge Instructions (Addendum)
EGD Discharge instructions Please read the instructions outlined below and refer to this sheet in the next few weeks. These discharge instructions provide you with general information on caring for yourself after you leave the hospital. Your doctor may also give you specific instructions. While your treatment has been planned according to the most current medical practices available, unavoidable complications occasionally occur. If you have any problems or questions after discharge, please call your doctor. ACTIVITY You may resume your regular activity but move at a slower pace for the next 24 hours.  Take frequent rest periods for the next 24 hours.  Walking will help expel (get rid of) the air and reduce the bloated feeling in your abdomen.  No driving for 24 hours (because of the anesthesia (medicine) used during the test).  You may shower.  Do not sign any important legal documents or operate any machinery for 24 hours (because of the anesthesia used during the test).  NUTRITION Drink plenty of fluids.  You may resume your normal diet.  Begin with a light meal and progress to your normal diet.  Avoid alcoholic beverages for 24 hours or as instructed by your caregiver.  MEDICATIONS You may resume your normal medications unless your caregiver tells you otherwise.  WHAT YOU CAN EXPECT TODAY You may experience abdominal discomfort such as a feeling of fullness or "gas" pains.  FOLLOW-UP Your doctor will discuss the results of your test with you.  SEEK IMMEDIATE MEDICAL ATTENTION IF ANY OF THE FOLLOWING OCCUR: Excessive nausea (feeling sick to your stomach) and/or vomiting.  Severe abdominal pain and distention (swelling).  Trouble swallowing.  Temperature over 101 F (37.8 C).  Rectal bleeding or vomiting of blood.    Colonoscopy Discharge Instructions  Read the instructions outlined below and refer to this sheet in the next few weeks. These discharge instructions provide you with  general information on caring for yourself after you leave the hospital. Your doctor may also give you specific instructions. While your treatment has been planned according to the most current medical practices available, unavoidable complications occasionally occur.   ACTIVITY You may resume your regular activity, but move at a slower pace for the next 24 hours.  Take frequent rest periods for the next 24 hours.  Walking will help get rid of the air and reduce the bloated feeling in your belly (abdomen).  No driving for 24 hours (because of the medicine (anesthesia) used during the test).   Do not sign any important legal documents or operate any machinery for 24 hours (because of the anesthesia used during the test).  NUTRITION Drink plenty of fluids.  You may resume your normal diet as instructed by your doctor.  Begin with a light meal and progress to your normal diet. Heavy or fried foods are harder to digest and may make you feel sick to your stomach (nauseated).  Avoid alcoholic beverages for 24 hours or as instructed.  MEDICATIONS You may resume your normal medications unless your doctor tells you otherwise.  WHAT YOU CAN EXPECT TODAY Some feelings of bloating in the abdomen.  Passage of more gas than usual.  Spotting of blood in your stool or on the toilet paper.  IF YOU HAD POLYPS REMOVED DURING THE COLONOSCOPY: No aspirin products for 7 days or as instructed.  No alcohol for 7 days or as instructed.  Eat a soft diet for the next 24 hours.  FINDING OUT THE RESULTS OF YOUR TEST Not all test results are available  during your visit. If your test results are not back during the visit, make an appointment with your caregiver to find out the results. Do not assume everything is normal if you have not heard from your caregiver or the medical facility. It is important for you to follow up on all of your test results.  SEEK IMMEDIATE MEDICAL ATTENTION IF: You have more than a spotting of  blood in your stool.  Your belly is swollen (abdominal distention).  You are nauseated or vomiting.  You have a temperature over 101.  You have abdominal pain or discomfort that is severe or gets worse throughout the day.   Your EGD revealed mild amount inflammation in your stomach.  I took biopsies of this to rule out infection with a bacteria called H. pylori.  Await pathology results, my office will contact you.  Continue omeprazole as needed.  Overall your colon looked healthy.  No polyps or evidence of colon cancer.  Recommend repeat in 5 years given history of polyps prior.  You do have internal hemorrhoids.  We can consider hemorrhoid banding if bleeding continues.  Follow-up in GI office in 3 months.  I hope you have a great rest of your week!  Hennie Duos. Marletta Lor, D.O. Gastroenterology and Hepatology [facility]

## 2023-02-07 NOTE — Op Note (Signed)
[facility] Patient Name: [name] Procedure Date: 02/07/2023 12:21 PM MRN: [id number] Date of Birth: 07-02-1961 Attending MD: Hennie Duos. Marletta Lor , [place], [id number] CSN: [id number] Age: 62 Admit Type: Outpatient Procedure:                Colonoscopy Indications:              Rectal bleeding Providers:                Hennie Duos. Marletta Lor, DO, Sheran Fava, Lennice Sites Technician, Technician Referring MD:              Medicines:                See the Anesthesia note for documentation of the                            administered medications Complications:            No immediate complications. Estimated Blood Loss:     Estimated blood loss: none. Procedure:                Pre-Anesthesia Assessment:                           - The anesthesia plan was to use monitored                            anesthesia care (MAC).                           After obtaining informed consent, the colonoscope                            was passed under direct vision. Throughout the                            procedure, the patient's blood pressure, pulse, and                            oxygen saturations were monitored continuously. The                            [id number]-HQ190L ([id number]) scope was introduced through                            the anus and advanced to the the cecum, identified                            by appendiceal orifice and ileocecal valve. The                            colonoscopy was performed without difficulty. The                            patient tolerated the procedure well. The quality  of the bowel preparation was evaluated using the                            BBPS [facility] Bowel Preparation Scale) with scores                            of: Right Colon = 2 (minor amount of residual                            staining, small fragments of stool and/or opaque                            liquid, but mucosa seen well),  Transverse Colon = 2                            (minor amount of residual staining, small fragments                            of stool and/or opaque liquid, but mucosa seen                            well) and Left Colon = 2 (minor amount of residual                            staining, small fragments of stool and/or opaque                            liquid, but mucosa seen well). The total BBPS score                            equals 6. The quality of the bowel preparation was                            good. Scope In: 12:51:51 PM Scope Out: 1:08:23 PM Scope Withdrawal Time: 0 hours 8 minutes 26 seconds  Total Procedure Duration: 0 hours 16 minutes 32 seconds  Findings:      Non-bleeding internal hemorrhoids were found during retroflexion.      The exam was otherwise without abnormality. Impression:               - Non-bleeding internal hemorrhoids.                           - The examination was otherwise normal.                           - No specimens collected. Moderate Sedation:      Per Anesthesia Care Recommendation:           - Patient has a contact number available for                            emergencies. The signs and symptoms of potential  delayed complications were discussed with the                            patient. Return to normal activities tomorrow.                            Written discharge instructions were provided to the                            patient.                           - Resume previous diet.                           - Continue present medications.                           - Repeat colonoscopy in 5 years for surveillance.                           - Return to GI clinic in 3 months. Procedure Code(s):        --- Professional ---                           [phone], Colonoscopy, flexible; diagnostic, including                            collection of specimen(s) by brushing or washing,                            when  performed (separate procedure) Diagnosis Code(s):        --- Professional ---                           K64.8, Other hemorrhoids                           K62.5, Hemorrhage of anus and rectum CPT copyright 2022 American Medical Association. All rights reserved. The codes documented in this report are preliminary and upon coder review may  be revised to meet current compliance requirements. Hennie Duos. Marletta Lor, DO Hennie Duos. Marletta Lor, DO 02/07/2023 1:15:15 PM This report has been signed electronically. Number of Addenda: 0

## 2023-02-07 NOTE — Anesthesia Procedure Notes (Signed)
Date/Time: 02/07/2023 12:43 PM  Performed by: Julian Reil, CRNAPre-anesthesia Checklist: Patient identified, Emergency Drugs available, Suction available and Patient being monitored Patient Re-evaluated:Patient Re-evaluated prior to induction Oxygen Delivery Method: Nasal cannula Induction Type: IV induction Placement Confirmation: positive ETCO2

## 2023-02-07 NOTE — Anesthesia Preprocedure Evaluation (Signed)
Anesthesia Evaluation  Patient identified by MRN, date of birth, ID band Patient awake    Reviewed: Allergy & Precautions, H&P , NPO status , Patient's Chart, lab work & pertinent test results, reviewed documented beta blocker date and time   Airway Mallampati: II  TM Distance: >3 FB Neck ROM: full    Dental no notable dental hx.    Pulmonary neg pulmonary ROS   Pulmonary exam normal breath sounds clear to auscultation       Cardiovascular Exercise Tolerance: Good negative cardio ROS + dysrhythmias  Rhythm:regular Rate:Normal     Neuro/Psych  Headaches PSYCHIATRIC DISORDERS Anxiety Depression     Neuromuscular disease negative neurological ROS  negative psych ROS   GI/Hepatic negative GI ROS, Neg liver ROS,GERD  ,,  Endo/Other  negative endocrine ROS    Renal/GU negative Renal ROS  negative genitourinary   Musculoskeletal   Abdominal   Peds  Hematology negative hematology ROS (+) Blood dyscrasia, anemia   Anesthesia Other Findings   Reproductive/Obstetrics negative OB ROS                             Anesthesia Physical Anesthesia Plan  ASA: 2  Anesthesia Plan: General   Post-op Pain Management:    Induction:   PONV Risk Score and Plan: Propofol infusion  Airway Management Planned:   Additional Equipment:   Intra-op Plan:   Post-operative Plan:   Informed Consent: I have reviewed the patients History and Physical, chart, labs and discussed the procedure including the risks, benefits and alternatives for the proposed anesthesia with the patient or authorized representative who has indicated his/her understanding and acceptance.     Dental Advisory Given  Plan Discussed with: CRNA  Anesthesia Plan Comments:         Anesthesia Quick Evaluation

## 2023-02-07 NOTE — H&P (Signed)
Primary Care Physician:  Elfredia Nevins, MD Primary Gastroenterologist:  Dr. Marletta Lor  Pre-Procedure History & Physical: HPI:  [name] is a 62 y.o. female is here for an EGD to be performed for GERD/epigastric pain and colonoscopy for rectal bleeding.  Past Medical History:  Diagnosis Date   Anemia    Arthritis    Breast cancer (HCC)    right breast cancer 5 yrs ago   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   APPENDIX IN LUQ   Depression    Dysrhythmia    hx palpitations-too much caffiene   Headache    patient denies this dx   Scoliosis     Past Surgical History:  Procedure Laterality Date   ABDOMINAL EXPOSURE N/A 08/25/2022   Procedure: ABDOMINAL EXPOSURE;  Surgeon: Cephus Shelling, MD;  Location: [facility] OR;  Service: Vascular;  Laterality: N/A;   ANKLE SURGERY     left ankle cyst   ANTERIOR LUMBAR FUSION N/A 08/25/2022   Procedure: ANTERIOR LUMBAR FOUR TO FIVE INTERBODY FUSION, ILIAC CRST BONE GRAFT HARVEST;  Surgeon: Venita Lick, MD;  Location: [facility] OR;  Service: Orthopedics;  Laterality: N/A;  4 HRS DR. CLARK TO DO APPROACH 3 C-BED   BACK SURGERY  2000   lumbar disc surgery   BIOPSY  01/07/2020   Procedure: BIOPSY;  Surgeon: West Bali, MD;  Location: [facility] ENDO SUITE;  Service: Endoscopy;;   BREAST EXCISIONAL BIOPSY Left    benign   BREAST SURGERY Left    left partail mastectomy-cyst   CERVICAL DISC SURGERY  1999   anterior and a posterier cerv fusionx2   CESAREAN SECTION     x2   COLONOSCOPY     COLONOSCOPY WITH PROPOFOL N/A 06/26/2018   Procedure: COLONOSCOPY WITH PROPOFOL;  Surgeon: West Bali, MD;  Location: [facility] SUITE;  Service: Endoscopy;  Laterality: N/A;  11:00am   DILATION AND CURETTAGE OF UTERUS     DILITATION & CURRETTAGE/HYSTROSCOPY WITH THERMACHOICE ABLATION  07/10/2012   Procedure: DILATATION & CURETTAGE/HYSTEROSCOPY WITH THERMACHOICE ABLATION;  Surgeon: [name], MD;  Location: [facility] ORS;  Service: Gynecology;  Laterality: N/A;   D5 13ml in, 13 ml out, temp 87degree celcius, total therapy time 16sec   ERCP     ESOPHAGOGASTRODUODENOSCOPY (EGD) WITH PROPOFOL N/A 01/07/2020   Procedure: ESOPHAGOGASTRODUODENOSCOPY (EGD) WITH PROPOFOL;  Surgeon: West Bali, MD;  Location: [facility];  Service: Endoscopy;  Laterality: N/A;  2:00pm   EYE SURGERY     HARVEST BONE GRAFT N/A 08/25/2022   Procedure: HARVEST ILIAC BONE GRAFT;  Surgeon: Venita Lick, MD;  Location: [facility] OR;  Service: Orthopedics;  Laterality: N/A;   POLYPECTOMY  06/26/2018   Procedure: POLYPECTOMY;  Surgeon: West Bali, MD;  Location: [facility];  Service: Endoscopy;;  colon   TUBAL LIGATION     with last c-section    Prior to Admission medications   Medication Sig Start Date End Date Taking? Authorizing Provider  ALPRAZolam Prudy Feeler) 0.5 MG tablet Take 0.25 mg by mouth 3 (three) times daily as needed for anxiety.   Yes [provider]  amitriptyline (ELAVIL) 50 MG tablet Take 50 mg by mouth at bedtime.   Yes [provider]  bethanechol (URECHOLINE) 5 MG tablet Take 5 mg by mouth in the morning and at bedtime.   Yes [provider]  calcium carbonate (OSCAL) 1500 (600 Ca) MG TABS tablet Take 600 mg by mouth in the morning.   Yes [provider]  celecoxib (CELEBREX) 200 MG capsule Take 200 mg by mouth in the morning. 11/16/22  Yes [provider]  cholecalciferol (GNP VITAMIN D3) 10 MCG (400 UNIT) TABS tablet Take 400 Units by mouth in the morning.   Yes [provider]  cyclobenzaprine (FLEXERIL) 10 MG tablet Take 10 mg by mouth 3 (three) times daily as needed for muscle spasms. 10/21/22  Yes [provider]  dicyclomine (BENTYL) 10 MG capsule Take 10 mg by mouth 4 (four) times daily as needed for spasms. 12/09/22  Yes [provider]  gabapentin (NEURONTIN) 300 MG capsule Take 300 mg by mouth 3 (three) times daily.   Yes [provider]  ibuprofen (ADVIL) 800 MG  tablet Take 800 mg by mouth 2 (two) times daily as needed (pain.).   Yes [provider]  iron polysaccharides (NIFEREX) 150 MG capsule Take 150 mg by mouth daily as needed (low iron).   Yes [provider]  loratadine (CLARITIN) 10 MG tablet Take 10 mg by mouth daily as needed for allergies.   Yes [provider]  lubiprostone (AMITIZA) 24 MCG capsule Take 1 capsule (24 mcg total) by mouth 2 (two) times daily with a meal. Patient taking differently: Take 24 mcg by mouth 2 (two) times daily as needed (constipation.). 01/09/23  Yes Tiffany Kocher, PA-C  omeprazole (PRILOSEC) 20 MG capsule TAKE (1) CAPSULE BY MOUTH ONCE DAILY 30 MINUTES PRIOR TO FIRST MEAL. Patient taking differently: Take 20 mg by mouth daily as needed (indigestion/heartburn.). 01/11/23  Yes Tiffany Kocher, PA-C  Polyethyl Glycol-Propyl Glycol (LUBRICANT EYE DROPS) 0.4-0.3 % SOLN Place 1-2 drops into both eyes 3 (three) times daily as needed (dry/irritated eyes.).   Yes [provider]  escitalopram (LEXAPRO) 10 MG tablet Take 10 mg by mouth daily.    [provider]  GAVILYTE-G 236 g solution SMARTSIG:Milliliter(s) By Mouth 01/09/23   [provider]    Allergies as of 01/09/2023   (No Known Allergies)    Family History  Problem Relation Age of Onset   Hypertension Mother    Colon cancer Neg Hx    Gastric cancer Neg Hx    Esophageal cancer Neg Hx     Social History   Socioeconomic History   Marital status: Married    Spouse name: Not on file   Number of children: 2   Years of education: Not on file   Highest education level: Not on file  Occupational History   Not on file  Tobacco Use   Smoking status: Never   Smokeless tobacco: Never  Vaping Use   Vaping Use: Never used  Substance and Sexual Activity   Alcohol use: No   Drug use: No   Sexual activity: Yes    Birth control/protection: Surgical, Post-menopausal    Comment: tubal ligation & ablation  Other  Topics Concern   Not on file  Social History Narrative   Not on file   Social Determinants of Health   Financial Resource Strain: Medium Risk (08/08/2022)   Overall Financial Resource Strain (CARDIA)    Difficulty of Paying Living Expenses: Somewhat hard  Food Insecurity: Food Insecurity Present (08/08/2022)   Hunger Vital Sign    Worried About Running Out of Food in the Last Year: Sometimes true    Ran Out of Food in the Last Year: Often true  Transportation Needs: No Transportation Needs (08/08/2022)   PRAPARE - Administrator, Civil Service (Medical): No  Lack of Transportation (Non-Medical): No  Physical Activity: Inactive (08/08/2022)   Exercise Vital Sign    Days of Exercise per Week: 0 days    Minutes of Exercise per Session: 0 min  Stress: No Stress Concern Present (08/08/2022)   [employer] of Occupational Health - Occupational Stress Questionnaire    Feeling of Stress : Not at all  Social Connections: Moderately Integrated (08/08/2022)   Social Connection and Isolation Panel [NHANES]    Frequency of Communication with Friends and Family: More than three times a week    Frequency of Social Gatherings with Friends and Family: Three times a week    Attends Religious Services: 1 to 4 times per year    Active Member of Clubs or Organizations: No    Attends Banker Meetings: Never    Marital Status: Married  Catering manager Violence: Not At Risk (08/08/2022)   Humiliation, Afraid, Rape, and Kick questionnaire    Fear of Current or Ex-Partner: No    Emotionally Abused: No    Physically Abused: No    Sexually Abused: No    Review of Systems: General: Negative for fever, chills, fatigue, weakness. Eyes: Negative for vision changes.  ENT: Negative for hoarseness, difficulty swallowing , nasal congestion. CV: Negative for chest pain, angina, palpitations, dyspnea on exertion, peripheral edema.  Respiratory: Negative for dyspnea at rest, dyspnea  on exertion, cough, sputum, wheezing.  GI: See history of present illness. GU:  Negative for dysuria, hematuria, urinary incontinence, urinary frequency, nocturnal urination.  MS: Negative for joint pain, low back pain.  Derm: Negative for rash or itching.  Neuro: Negative for weakness, abnormal sensation, seizure, frequent headaches, memory loss, confusion.  Psych: Negative for anxiety, depression Endo: Negative for unusual weight change.  Heme: Negative for bruising or bleeding. Allergy: Negative for rash or hives.  Physical Exam: Vital signs in last 24 hours: Temp:  [98 F (36.7 C)] 98 F (36.7 C) (06/04 1143) Pulse Rate:  [97] 97 (06/04 1143) Resp:  [18] 18 (06/04 1143) BP: (127)/(87) 127/87 (06/04 1143) SpO2:  [97 %] 97 % (06/04 1143) Weight:  [65.8 kg] 65.8 kg (06/04 1143)   General:   Alert,  Well-developed, well-nourished, pleasant and cooperative in NAD Head:  Normocephalic and atraumatic. Eyes:  Sclera clear, no icterus.   Conjunctiva pink. Ears:  Normal auditory acuity. Nose:  No deformity, discharge,  or lesions. Msk:  Symmetrical without gross deformities. Normal posture. Extremities:  Without clubbing or edema. Neurologic:  Alert and  oriented x4;  grossly normal neurologically. Skin:  Intact without significant lesions or rashes. Psych:  Alert and cooperative. Normal mood and affect.   Impression/Plan: [name] is here for an EGD to be performed for GERD/epigastric pain and colonoscopy for rectal bleeding.  Risks, benefits, limitations, imponderables and alternatives regarding EGD have been reviewed with the patient. Questions have been answered. All parties agreeable.

## 2023-02-08 LAB — SURGICAL PATHOLOGY

## 2023-02-10 NOTE — Anesthesia Postprocedure Evaluation (Signed)
Anesthesia Post Note  Patient: [name]  Procedure(s) Performed: COLONOSCOPY WITH PROPOFOL ESOPHAGOGASTRODUODENOSCOPY (EGD) WITH PROPOFOL BIOPSY  Patient location during evaluation: Phase II Anesthesia Type: General Level of consciousness: awake Pain management: pain level controlled Vital Signs Assessment: post-procedure vital signs reviewed and stable Respiratory status: spontaneous breathing and respiratory function stable Cardiovascular status: blood pressure returned to baseline and stable Postop Assessment: no headache and no apparent nausea or vomiting Anesthetic complications: no Comments: Late entry   No notable events documented.   Last Vitals:  Vitals:   02/07/23 1143 02/07/23 1311  BP: 127/87 94/73  Pulse: 97 83  Resp: 18 16  Temp: 36.7 C 36.7 C  SpO2: 97% 100%    Last Pain:  Vitals:   02/07/23 1311  TempSrc: Oral  PainSc: 5                  Windell Norfolk

## 2023-02-14 ENCOUNTER — Encounter (HOSPITAL_COMMUNITY): Payer: Self-pay | Admitting: Internal Medicine

## 2023-02-20 DIAGNOSIS — M545 Low back pain, unspecified: Secondary | ICD-10-CM | POA: Diagnosis not present

## 2023-02-28 DIAGNOSIS — Z8739 Personal history of other diseases of the musculoskeletal system and connective tissue: Secondary | ICD-10-CM | POA: Diagnosis not present

## 2023-02-28 DIAGNOSIS — Z09 Encounter for follow-up examination after completed treatment for conditions other than malignant neoplasm: Secondary | ICD-10-CM | POA: Diagnosis not present

## 2023-03-02 DIAGNOSIS — M5416 Radiculopathy, lumbar region: Secondary | ICD-10-CM | POA: Diagnosis not present

## 2023-03-05 DIAGNOSIS — K219 Gastro-esophageal reflux disease without esophagitis: Secondary | ICD-10-CM | POA: Diagnosis not present

## 2023-03-05 DIAGNOSIS — I10 Essential (primary) hypertension: Secondary | ICD-10-CM | POA: Diagnosis not present

## 2023-03-05 DIAGNOSIS — M1991 Primary osteoarthritis, unspecified site: Secondary | ICD-10-CM | POA: Diagnosis not present

## 2023-03-05 DIAGNOSIS — F419 Anxiety disorder, unspecified: Secondary | ICD-10-CM | POA: Diagnosis not present

## 2023-03-05 DIAGNOSIS — R109 Unspecified abdominal pain: Secondary | ICD-10-CM | POA: Diagnosis not present

## 2023-03-07 DIAGNOSIS — M5416 Radiculopathy, lumbar region: Secondary | ICD-10-CM | POA: Diagnosis not present

## 2023-03-17 DIAGNOSIS — H40013 Open angle with borderline findings, low risk, bilateral: Secondary | ICD-10-CM | POA: Diagnosis not present

## 2023-03-17 DIAGNOSIS — H2513 Age-related nuclear cataract, bilateral: Secondary | ICD-10-CM | POA: Diagnosis not present

## 2023-03-28 ENCOUNTER — Other Ambulatory Visit (HOSPITAL_COMMUNITY): Payer: Self-pay | Admitting: Internal Medicine

## 2023-03-28 DIAGNOSIS — F339 Major depressive disorder, recurrent, unspecified: Secondary | ICD-10-CM | POA: Diagnosis not present

## 2023-03-28 DIAGNOSIS — I6930 Unspecified sequelae of cerebral infarction: Secondary | ICD-10-CM

## 2023-03-28 DIAGNOSIS — Z9229 Personal history of other drug therapy: Secondary | ICD-10-CM | POA: Diagnosis not present

## 2023-03-28 DIAGNOSIS — C50211 Malignant neoplasm of upper-inner quadrant of right female breast: Secondary | ICD-10-CM | POA: Diagnosis not present

## 2023-03-28 DIAGNOSIS — Z6823 Body mass index (BMI) 23.0-23.9, adult: Secondary | ICD-10-CM | POA: Diagnosis not present

## 2023-03-28 DIAGNOSIS — F419 Anxiety disorder, unspecified: Secondary | ICD-10-CM | POA: Diagnosis not present

## 2023-03-28 DIAGNOSIS — N319 Neuromuscular dysfunction of bladder, unspecified: Secondary | ICD-10-CM | POA: Diagnosis not present

## 2023-03-28 DIAGNOSIS — G894 Chronic pain syndrome: Secondary | ICD-10-CM | POA: Diagnosis not present

## 2023-03-28 DIAGNOSIS — M5416 Radiculopathy, lumbar region: Secondary | ICD-10-CM | POA: Diagnosis not present

## 2023-03-28 DIAGNOSIS — I10 Essential (primary) hypertension: Secondary | ICD-10-CM | POA: Diagnosis not present

## 2023-03-29 ENCOUNTER — Other Ambulatory Visit (HOSPITAL_COMMUNITY): Payer: Self-pay | Admitting: Internal Medicine

## 2023-03-29 DIAGNOSIS — I6930 Unspecified sequelae of cerebral infarction: Secondary | ICD-10-CM

## 2023-03-30 DIAGNOSIS — R3911 Hesitancy of micturition: Secondary | ICD-10-CM | POA: Diagnosis not present

## 2023-03-30 DIAGNOSIS — Z6823 Body mass index (BMI) 23.0-23.9, adult: Secondary | ICD-10-CM | POA: Diagnosis not present

## 2023-03-30 DIAGNOSIS — I10 Essential (primary) hypertension: Secondary | ICD-10-CM | POA: Diagnosis not present

## 2023-03-30 DIAGNOSIS — I6930 Unspecified sequelae of cerebral infarction: Secondary | ICD-10-CM | POA: Diagnosis not present

## 2023-03-30 DIAGNOSIS — R300 Dysuria: Secondary | ICD-10-CM | POA: Diagnosis not present

## 2023-04-02 ENCOUNTER — Ambulatory Visit (HOSPITAL_COMMUNITY)
Admission: RE | Admit: 2023-04-02 | Discharge: 2023-04-02 | Disposition: A | Discharge status: Home / Self Care | Payer: Medicare HMO | Source: Ambulatory Visit | Attending: Internal Medicine | Admitting: Internal Medicine

## 2023-04-02 DIAGNOSIS — I6930 Unspecified sequelae of cerebral infarction: Secondary | ICD-10-CM | POA: Insufficient documentation

## 2023-04-03 ENCOUNTER — Encounter (HOSPITAL_COMMUNITY): Payer: Self-pay

## 2023-04-03 ENCOUNTER — Other Ambulatory Visit: Payer: Self-pay

## 2023-04-03 ENCOUNTER — Emergency Department (HOSPITAL_COMMUNITY)
Admission: EM | Admit: 2023-04-03 | Discharge: 2023-04-03 | Disposition: A | Discharge status: Home / Self Care | Payer: Medicare HMO | Attending: Emergency Medicine | Admitting: Emergency Medicine

## 2023-04-03 ENCOUNTER — Emergency Department (HOSPITAL_COMMUNITY): Payer: Medicare HMO

## 2023-04-03 DIAGNOSIS — R479 Unspecified speech disturbances: Secondary | ICD-10-CM | POA: Diagnosis not present

## 2023-04-03 DIAGNOSIS — D72819 Decreased white blood cell count, unspecified: Secondary | ICD-10-CM | POA: Insufficient documentation

## 2023-04-03 DIAGNOSIS — Z853 Personal history of malignant neoplasm of breast: Secondary | ICD-10-CM | POA: Diagnosis not present

## 2023-04-03 DIAGNOSIS — R202 Paresthesia of skin: Secondary | ICD-10-CM | POA: Diagnosis not present

## 2023-04-03 DIAGNOSIS — R531 Weakness: Secondary | ICD-10-CM | POA: Diagnosis not present

## 2023-04-03 LAB — DIFFERENTIAL
Abs Immature Granulocytes: 0.01 10*3/uL (ref 0.00–0.07)
Basophils Absolute: 0.1 10*3/uL (ref 0.0–0.1)
Basophils Relative: 1 %
Eosinophils Absolute: 0 10*3/uL (ref 0.0–0.5)
Eosinophils Relative: 1 %
Immature Granulocytes: 0 %
Lymphocytes Relative: 40 %
Lymphs Abs: 1.4 10*3/uL (ref 0.7–4.0)
Monocytes Absolute: 0.4 10*3/uL (ref 0.1–1.0)
Monocytes Relative: 11 %
Neutro Abs: 1.6 10*3/uL — ABNORMAL LOW (ref 1.7–7.7)
Neutrophils Relative %: 47 %

## 2023-04-03 LAB — COMPREHENSIVE METABOLIC PANEL
ALT: 16 U/L (ref 0–44)
AST: 27 U/L (ref 15–41)
Albumin: 3.7 g/dL (ref 3.5–5.0)
Alkaline Phosphatase: 75 U/L (ref 38–126)
Anion gap: 6 (ref 5–15)
BUN: 11 mg/dL (ref 8–23)
CO2: 22 mmol/L (ref 22–32)
Calcium: 8.5 mg/dL — ABNORMAL LOW (ref 8.9–10.3)
Chloride: 107 mmol/L (ref 98–111)
Creatinine, Ser: 0.59 mg/dL (ref 0.44–1.00)
GFR, Estimated: >60 mL/min (ref >60)
Glucose, Bld: 151 mg/dL — ABNORMAL HIGH (ref 70–99)
Potassium: 3.5 mmol/L (ref 3.5–5.1)
Sodium: 135 mmol/L (ref 135–145)
Total Bilirubin: 0.6 mg/dL (ref 0.3–1.2)
Total Protein: 7.1 g/dL (ref 6.5–8.1)

## 2023-04-03 LAB — CBG MONITORING, ED: Glucose-Capillary: 155 mg/dL — ABNORMAL HIGH (ref 70–99)

## 2023-04-03 LAB — ETHANOL: Alcohol, Ethyl (B): <10 mg/dL (ref <10)

## 2023-04-03 LAB — CBC
HCT: 35.1 % — ABNORMAL LOW (ref 36.0–46.0)
Hemoglobin: 11.5 g/dL — ABNORMAL LOW (ref 12.0–15.0)
MCH: 31.6 pg (ref 26.0–34.0)
MCHC: 32.8 g/dL (ref 30.0–36.0)
MCV: 96.4 fL (ref 80.0–100.0)
Platelets: 237 10*3/uL (ref 150–400)
RBC: 3.64 MIL/uL — ABNORMAL LOW (ref 3.87–5.11)
RDW: 13.5 % (ref 11.5–15.5)
WBC: 3.5 10*3/uL — ABNORMAL LOW (ref 4.0–10.5)
nRBC: 0 % (ref 0.0–0.2)

## 2023-04-03 LAB — URINALYSIS, ROUTINE W REFLEX MICROSCOPIC
Bilirubin Urine: NEGATIVE
Glucose, UA: NEGATIVE mg/dL
Hgb urine dipstick: NEGATIVE
Ketones, ur: NEGATIVE mg/dL
Leukocytes,Ua: NEGATIVE
Nitrite: NEGATIVE
Protein, ur: NEGATIVE mg/dL
Specific Gravity, Urine: 1.011 (ref 1.005–1.030)
pH: 7 (ref 5.0–8.0)

## 2023-04-03 LAB — RAPID URINE DRUG SCREEN, HOSP PERFORMED
Amphetamines: NOT DETECTED
Barbiturates: NOT DETECTED
Benzodiazepines: NOT DETECTED
Cocaine: NOT DETECTED
Opiates: POSITIVE — AB
Tetrahydrocannabinol: NOT DETECTED

## 2023-04-03 LAB — PROTIME-INR
INR: 0.9 (ref 0.8–1.2)
Prothrombin Time: 12.5 seconds (ref 11.4–15.2)

## 2023-04-03 LAB — APTT: aPTT: 29 seconds (ref 24–36)

## 2023-04-03 NOTE — ED Triage Notes (Signed)
Pt states she had stroke 2 weeks ago and was seen by PCP. Pt having difficulty with speech for two days now and reports left sided weakness.

## 2023-04-03 NOTE — ED Notes (Signed)
Patient transported to CT 

## 2023-04-03 NOTE — Discharge Instructions (Addendum)
You were seen in the emergency department for evaluation of some difficulty with your speech along with some weakness in your left leg and numbness in your left arm and leg.  You had an MRI yesterday that did not show any signs of stroke.  You had a CAT scan and labs today that did not show any significant abnormalities other than your blood sugar being mildly elevated.  Please continue regular medications and follow-up with your primary care doctor.  We have also put a referral in for you to follow-up with a neurologist.  Return to the emergency department if any worsening or concerning symptoms

## 2023-04-03 NOTE — ED Provider Notes (Signed)
[place] EMERGENCY DEPARTMENT AT [facility] Provider Note   CSN: [id number] Arrival date & time: 04/03/23  0701     History  Chief Complaint  Patient presents with   Aphasia    Pt states she having difficulty speaking    [name] is a 62 y.o. female.  She has a history of breast cancer status post resection.  She had back surgery in December and she continues to have pain and weakness in her left leg, gives way at times.  She said for the past 2 weeks she has had difficulty with her speech and increased weakness on her left side.  She also feels some numbness on the left side, tongue is numb.  She tells me she saw Dr. Sherwood Gambler few days ago and he told her she might of had a stroke.  He ordered her an MRI which she had yesterday.  It did not show any signs of acute stroke.  Symptoms worsened today.  She denies any headache blurry vision chest pain shortness of breath abdominal pain urinary symptoms.  She has had recent falls which she attributes to her left leg weakness.  She also follows with the pain clinic.  The history is provided by the patient.  Cerebrovascular Accident This is a new problem. The current episode started more than 1 week ago. The problem occurs constantly. The problem has been gradually worsening. Pertinent negatives include no chest pain, no abdominal pain, no headaches and no shortness of breath. The symptoms are aggravated by walking (speaking). Nothing relieves the symptoms. She has tried nothing for the symptoms. The treatment provided no relief.       Home Medications Prior to Admission medications   Medication Sig Start Date End Date Taking? Authorizing Provider  ALPRAZolam Prudy Feeler) 0.5 MG tablet Take 0.25 mg by mouth 3 (three) times daily as needed for anxiety.    [provider]  amitriptyline (ELAVIL) 50 MG tablet Take 50 mg by mouth at bedtime.    [provider]  bethanechol (URECHOLINE) 5 MG tablet Take 5 mg by mouth in the  morning and at bedtime.    [provider]  calcium carbonate (OSCAL) 1500 (600 Ca) MG TABS tablet Take 600 mg by mouth in the morning.    [provider]  celecoxib (CELEBREX) 200 MG capsule Take 200 mg by mouth in the morning. 11/16/22   [provider]  cholecalciferol (GNP VITAMIN D3) 10 MCG (400 UNIT) TABS tablet Take 400 Units by mouth in the morning.    [provider]  cyclobenzaprine (FLEXERIL) 10 MG tablet Take 10 mg by mouth 3 (three) times daily as needed for muscle spasms. 10/21/22   [provider]  dicyclomine (BENTYL) 10 MG capsule Take 10 mg by mouth 4 (four) times daily as needed for spasms. 12/09/22   [provider]  escitalopram (LEXAPRO) 10 MG tablet Take 10 mg by mouth daily.    [provider]  gabapentin (NEURONTIN) 300 MG capsule Take 300 mg by mouth 3 (three) times daily.    [provider]  ibuprofen (ADVIL) 800 MG tablet Take 800 mg by mouth 2 (two) times daily as needed (pain.).    [provider]  iron polysaccharides (NIFEREX) 150 MG capsule Take 150 mg by mouth daily as needed (low iron).    [provider]  loratadine (CLARITIN) 10 MG tablet Take 10 mg by mouth daily as needed for allergies.    [provider]  lubiprostone (AMITIZA) 24 MCG capsule Take 1 capsule (24 mcg total) by mouth 2 (two) times daily with a meal. Patient taking differently: Take 24 mcg by mouth 2 (two) times daily as needed (constipation.). 01/09/23   Tiffany Kocher, PA-C  omeprazole (PRILOSEC) 20 MG capsule TAKE (1) CAPSULE BY MOUTH ONCE DAILY 30 MINUTES PRIOR TO FIRST MEAL. Patient taking differently: Take 20 mg by mouth daily as needed (indigestion/heartburn.). 01/11/23   Tiffany Kocher, PA-C  Polyethyl Glycol-Propyl Glycol (LUBRICANT EYE DROPS) 0.4-0.3 % SOLN Place 1-2 drops into both eyes 3 (three) times daily as needed (dry/irritated eyes.).    [provider]      Allergies    Patient  has no known allergies.    Review of Systems   Review of Systems  Constitutional:  Negative for fever.  Eyes:  Negative for visual disturbance.  Respiratory:  Negative for shortness of breath.   Cardiovascular:  Negative for chest pain.  Gastrointestinal:  Negative for abdominal pain.  Genitourinary:  Negative for dysuria.  Musculoskeletal:  Positive for back pain and gait problem.  Neurological:  Positive for speech difficulty, weakness and numbness. Negative for facial asymmetry and headaches.    Physical Exam Updated Vital Signs BP (!) 144/79 (BP Location: Left Arm)   Pulse 94   Temp 98.1 F (36.7 C) (Oral)   Resp 20   Ht 5\' 7"  (1.702 m)   Wt 64.9 kg   LMP 12/06/2013   SpO2 99%   BMI 22.40 kg/m  Physical Exam Vitals and nursing note reviewed.  Constitutional:      General: She is not in acute distress.    Appearance: Normal appearance. She is well-developed.  HENT:     Head: Normocephalic and atraumatic.  Eyes:     Conjunctiva/sclera: Conjunctivae normal.  Cardiovascular:     Rate and Rhythm: Normal rate and regular rhythm.     Heart sounds: No murmur heard. Pulmonary:     Effort: Pulmonary effort is normal. No respiratory distress.     Breath sounds: Normal breath sounds.  Abdominal:     Palpations: Abdomen is soft.     Tenderness: There is no abdominal tenderness. There is no guarding or rebound.  Musculoskeletal:        General: No swelling.     Cervical back: Neck supple.  Skin:    General: Skin is warm and dry.     Capillary Refill: Capillary refill takes less than 2 seconds.  Neurological:     Mental Status: She is alert and oriented to person, place, and time.     Cranial Nerves: Cranial nerve deficit present.     Sensory: Sensory deficit present.     Motor: Weakness present.     Comments: She is awake and alert.  She has some stuttering speech at times, not slurred.  Upper extremity strength 5 out of 5 no drift.  Cranial nerves otherwise normal.   Lower extremity strength she is 5 out of 5 but gives way on left leg due to pain.  Sensation is diminished on her thigh left and her left upper arm.      ED Results / Procedures / Treatments   Labs (all labs ordered are listed, but only abnormal results are displayed) Labs Reviewed  CBC - Abnormal; Notable for the following components:      Result Value   WBC 3.5 (*)    RBC 3.64 (*)    Hemoglobin 11.5 (*)    HCT  35.1 (*)    All other components within normal limits  DIFFERENTIAL - Abnormal; Notable for the following components:   Neutro Abs 1.6 (*)    All other components within normal limits  COMPREHENSIVE METABOLIC PANEL - Abnormal; Notable for the following components:   Glucose, Bld 151 (*)    Calcium 8.5 (*)    All other components within normal limits  RAPID URINE DRUG SCREEN, HOSP PERFORMED - Abnormal; Notable for the following components:   Opiates POSITIVE (*)    All other components within normal limits  URINALYSIS, ROUTINE W REFLEX MICROSCOPIC - Abnormal; Notable for the following components:   Color, Urine STRAW (*)    All other components within normal limits  CBG MONITORING, ED - Abnormal; Notable for the following components:   Glucose-Capillary 155 (*)    All other components within normal limits  ETHANOL  PROTIME-INR  APTT    EKG EKG Interpretation Date/Time:  Monday April 03 2023 07:23:38 EDT Ventricular Rate:  92 PR Interval:  163 QRS Duration:  94 QT Interval:  376 QTC Calculation: 466 R Axis:   66  Text Interpretation: Sinus rhythm Right atrial enlargement No significant change since prior 12/23 Confirmed by Meridee Score [phone]) on 04/03/2023 7:25:56 AM  Radiology CT HEAD WO CONTRAST  Result Date: 04/03/2023 CLINICAL DATA:  62 year old female with weakness on the left side. Neurologic deficit. EXAM: CT HEAD WITHOUT CONTRAST TECHNIQUE: Contiguous axial images were obtained from the base of the skull through the vertex without intravenous  contrast. RADIATION DOSE REDUCTION: This exam was performed according to the departmental dose-optimization program which includes automated exposure control, adjustment of the mA and/or kV according to patient size and/or use of iterative reconstruction technique. COMPARISON:  [facility] Brain MRI yesterday. Head CT 08/05/2022. FINDINGS: Brain: Cerebral volume remains within normal limits for age. No midline shift, ventriculomegaly, mass effect, evidence of mass lesion, intracranial hemorrhage or evidence of cortically based acute infarction. Patchy white matter hypodensity including in the anterior right internal capsule, corresponding to T2/FLAIR abnormality on the MRI yesterday and appears stable by CT since last year. Stable gray-white matter differentiation throughout the brain. Vascular: No suspicious intracranial vascular hyperdensity. Mild Calcified atherosclerosis at the skull base. Skull: No acute osseous abnormality identified. Sinuses/Orbits: Visualized paranasal sinuses and mastoids are stable and well aerated. Other: Visualized orbits and scalp soft tissues are within normal limits. IMPRESSION: Stable noncontrast Head CT since last year, and stable when compared to Brain MRI yesterday (please see that report). Electronically Signed   By: Odessa Fleming M.D.   On: 04/03/2023 07:55   MR BRAIN WO CONTRAST  Result Date: 04/02/2023 CLINICAL DATA:  Provided history: Sequela of cerebrovascular accident. EXAM: MRI HEAD WITHOUT CONTRAST TECHNIQUE: Multiplanar, multiecho pulse sequences of the brain and surrounding structures were obtained without intravenous contrast. COMPARISON:  Brain MRI 08/05/2021 FINDINGS: Brain: No age advanced or lobar predominant parenchymal atrophy. Multifocal T2 FLAIR hyperintense signal abnormality within the cerebral white matter and pons, mild-to-moderate for age. No cortical encephalomalacia is identified. There is no acute infarct. No evidence of an intracranial mass. No  chronic intracranial blood products. No extra-axial fluid collection. No midline shift. Vascular: Maintained flow voids within the proximal large arterial vessels. Skull and upper cervical spine: No focal suspicious marrow lesion. Susceptibility artifact arising from cervical spinal fusion hardware. Sinuses/Orbits: No mass or acute finding within the imaged orbits. No significant paranasal sinus disease. Other: Trace fluid within the bilateral mastoid air cells. IMPRESSION: 1.  No evidence of an acute intracranial abnormality. 2. Multifocal T2 FLAIR hyperintense signal abnormality within the cerebral white matter and pons, mild-to-moderate for age. These signal changes are nonspecific, but most often secondary to chronic small vessel ischemia. Findings are similar to the prior brain MRI of 08/05/2022. Electronically Signed   By: Jackey Loge D.O.   On: 04/02/2023 09:52    Procedures Procedures    Medications Ordered in ED Medications - No data to display  ED Course/ Medical Decision Making/ A&P Clinical Course as of 04/03/23 1707  Mon Apr 03, 2023  0829 EKG did not cross into epic.  Normal sinus rhythm rate of 92 no acute ST-T changes. [MB]  [id number] Reviewed results of workup with patient.  She is comfortable plan for discharge and outpatient follow-up with her PCP.  Will also put her in for referral with neurology.  Return instructions discussed [MB]    Clinical Course User Index [MB] Terrilee Files, MD                             Medical Decision Making Amount and/or Complexity of Data Reviewed Labs: ordered. Radiology: ordered.   This patient complains of general weakness left-sided weakness numbness difficulty with speech; this involves an extensive number of treatment Options and is a complaint that carries with it a high risk of complications and morbidity. The differential includes stroke, bleed, tumor, seizure, metabolic derangement, infection  I ordered, reviewed and  interpreted labs, which included CBC with low white count low hemoglobin, chemistries with mildly elevated glucose, urinalysis without signs of infection I ordered imaging studies which included CT head and I independently    visualized and interpreted imaging which showed no acute findings Additional history obtained from patient's companion Previous records obtained and reviewed in epic including recent neurosurgery and PCP notes Cardiac monitoring reviewed, sinus rhythm Social determinants considered, patient physically inactive and has food insecurity Critical Interventions: None  After the interventions stated above, I reevaluated the patient and found patient's speech to still be altered although better than when she got here Admission and further testing considered, no indications for admission at this time.  Will put her in for referral to outpatient neurology.  Recommended close follow-up with PCP.  Return instructions discussed         Final Clinical Impression(s) / ED Diagnoses Final diagnoses:  Speech disturbance, unspecified type  Paresthesia    Rx / DC Orders ED Discharge Orders     None         Terrilee Files, MD 04/03/23 1709

## 2023-04-04 DIAGNOSIS — I10 Essential (primary) hypertension: Secondary | ICD-10-CM | POA: Diagnosis not present

## 2023-04-04 DIAGNOSIS — F321 Major depressive disorder, single episode, moderate: Secondary | ICD-10-CM | POA: Diagnosis not present

## 2023-04-04 DIAGNOSIS — G894 Chronic pain syndrome: Secondary | ICD-10-CM | POA: Diagnosis not present

## 2023-04-04 DIAGNOSIS — Z9229 Personal history of other drug therapy: Secondary | ICD-10-CM | POA: Diagnosis not present

## 2023-04-04 DIAGNOSIS — R269 Unspecified abnormalities of gait and mobility: Secondary | ICD-10-CM | POA: Diagnosis not present

## 2023-04-04 DIAGNOSIS — Z6823 Body mass index (BMI) 23.0-23.9, adult: Secondary | ICD-10-CM | POA: Diagnosis not present

## 2023-04-05 ENCOUNTER — Ambulatory Visit (HOSPITAL_COMMUNITY)
Admission: RE | Admit: 2023-04-05 | Discharge: 2023-04-05 | Disposition: A | Discharge status: Home / Self Care | Payer: Medicare HMO | Source: Ambulatory Visit | Attending: Internal Medicine | Admitting: Internal Medicine

## 2023-04-05 DIAGNOSIS — I6529 Occlusion and stenosis of unspecified carotid artery: Secondary | ICD-10-CM | POA: Diagnosis not present

## 2023-04-05 DIAGNOSIS — I6930 Unspecified sequelae of cerebral infarction: Secondary | ICD-10-CM | POA: Diagnosis not present

## 2023-04-12 DIAGNOSIS — H2513 Age-related nuclear cataract, bilateral: Secondary | ICD-10-CM | POA: Diagnosis not present

## 2023-04-12 DIAGNOSIS — R29898 Other symptoms and signs involving the musculoskeletal system: Secondary | ICD-10-CM | POA: Diagnosis not present

## 2023-04-12 DIAGNOSIS — H40013 Open angle with borderline findings, low risk, bilateral: Secondary | ICD-10-CM | POA: Diagnosis not present

## 2023-04-12 DIAGNOSIS — R4789 Other speech disturbances: Secondary | ICD-10-CM | POA: Diagnosis not present

## 2023-04-12 DIAGNOSIS — Z0100 Encounter for examination of eyes and vision without abnormal findings: Secondary | ICD-10-CM | POA: Diagnosis not present

## 2023-04-20 ENCOUNTER — Encounter: Payer: Self-pay | Admitting: Gastroenterology

## 2023-05-10 ENCOUNTER — Ambulatory Visit: Payer: Medicare HMO | Admitting: Gastroenterology

## 2023-05-10 ENCOUNTER — Telehealth: Payer: Self-pay | Admitting: *Deleted

## 2023-05-10 ENCOUNTER — Other Ambulatory Visit: Payer: Self-pay | Admitting: *Deleted

## 2023-05-10 ENCOUNTER — Encounter: Payer: Self-pay | Admitting: Gastroenterology

## 2023-05-10 VITALS — BP 132/84 | HR 93 | Temp 98.6°F | Ht 67.0 in | Wt 142.4 lb

## 2023-05-10 DIAGNOSIS — K2960 Other gastritis without bleeding: Secondary | ICD-10-CM

## 2023-05-10 DIAGNOSIS — R1904 Left lower quadrant abdominal swelling, mass and lump: Secondary | ICD-10-CM | POA: Diagnosis not present

## 2023-05-10 DIAGNOSIS — T39395A Adverse effect of other nonsteroidal anti-inflammatory drugs [NSAID], initial encounter: Secondary | ICD-10-CM

## 2023-05-10 DIAGNOSIS — K5900 Constipation, unspecified: Secondary | ICD-10-CM

## 2023-05-10 DIAGNOSIS — K219 Gastro-esophageal reflux disease without esophagitis: Secondary | ICD-10-CM | POA: Diagnosis not present

## 2023-05-10 NOTE — Addendum Note (Signed)
Addended by: Elinor Dodge on: 05/10/2023 01:59 PM   Modules accepted: Orders

## 2023-05-10 NOTE — Patient Instructions (Addendum)
Please follow up with your PCP regarding your medications to make sure you are taking what you should be taking. For your stomach, continue lubiprostone twice daily as needed for constipation. Please restart omeprazole 20mg  daily for gastritis. Add a good probiotic such as Restora, Align, [employer] and take daily for four weeks to see if this helps your stomach issues. CT scan to look at the fullness in the left lower abdomen.

## 2023-05-10 NOTE — Telephone Encounter (Signed)
[employer] PA for CT: Approved Authorization #[id number]  Tracking #[id number] Dates of service 05/10/2023 - 07/09/2023

## 2023-05-10 NOTE — Progress Notes (Signed)
GI Office Note    Referring Provider: Elfredia Nevins, MD Primary Care Physician:  Elfredia Nevins, MD  Primary Gastroenterologist: Hennie Duos. Marletta Lor, DO   Chief Complaint   Chief Complaint  Patient presents with   Constipation    Having issues with constipation and states that her stomach feels jumpy     History of Present Illness   [name] is a 62 y.o. female presenting today here for follow-up.  Last seen in the office back in May 2024.  Seen at that time for worsening constipation, rectal bleeding, black stools (in the setting of recent Pepto and iron use).  In the past she was managed with Linzess 72 mcg and Amitiza 24 mcg which would help but seem to lose efficacy.  Also was complaining of epigastric pain/reflux off PPI.  Using NSAIDs sometimes Celebrex and ibuprofen.  We started her on omeprazole 20 mg daily.  Started Amitiza 24 mcg twice daily and advised her to stop bismuth products.  She completed EGD and colonoscopy June 2024.  She was noted to have gastritis (focal mild chronic inactive gastritis but no H. pylori) nonbleeding internal hemorrhoids.  Plans to repeat colonoscopy in 5 years because of personal history of adenomatous colon polyps.  Presents today overall doing ok. She is bothered by constant rumbling and movement throughout her stomach. Feels like her stomach is nervous all the time. All she can think about. BM 2-3 times per week, productive soft stools. No straining. Takes Amitiza about once per week. If she takes regularly her stools get loose. No melena, brbpr. No abdominal pain. She is concerned about a fullness in her LLQ. Denies heartburn. No dysphagia. No n/v.  She seems confused about her medications. She tells me she doesn't take any of them regularly. States her mind keeps her from taking them.     Medications   Current Outpatient Medications  Medication Sig Dispense Refill   ALPRAZolam (XANAX) 0.5 MG tablet Take 0.5 mg by mouth 3 (three)  times daily as needed for anxiety.     amitriptyline (ELAVIL) 50 MG tablet Take 50 mg by mouth at bedtime.     calcium carbonate (OSCAL) 1500 (600 Ca) MG TABS tablet Take 600 mg by mouth in the morning.     celecoxib (CELEBREX) 200 MG capsule Take 200 mg by mouth in the morning.     cholecalciferol (GNP VITAMIN D3) 10 MCG (400 UNIT) TABS tablet Take 400 Units by mouth in the morning.     cyclobenzaprine (FLEXERIL) 10 MG tablet Take 10 mg by mouth 3 (three) times daily as needed for muscle spasms.     dicyclomine (BENTYL) 10 MG capsule Take 10 mg by mouth 4 (four) times daily as needed for spasms.     HYDROcodone-acetaminophen (NORCO) 10-325 MG tablet Take 1 tablet by mouth every 6 (six) hours as needed.     iron polysaccharides (NIFEREX) 150 MG capsule Take 150 mg by mouth daily as needed (low iron).     lubiprostone (AMITIZA) 24 MCG capsule Take 1 capsule (24 mcg total) by mouth 2 (two) times daily with a meal. (Patient taking differently: Take 24 mcg by mouth 2 (two) times daily as needed (constipation.).) 60 capsule 5   bethanechol (URECHOLINE) 5 MG tablet Take 5 mg by mouth in the morning and at bedtime. (Patient not taking: Reported on 05/10/2023)     buPROPion (WELLBUTRIN XL) 150 MG 24 hr tablet Take 150 mg by mouth daily. (Patient not taking:  Reported on 05/10/2023)     omeprazole (PRILOSEC) 20 MG capsule TAKE (1) CAPSULE BY MOUTH ONCE DAILY 30 MINUTES PRIOR TO FIRST MEAL. (Patient not taking: Reported on 05/10/2023) 30 capsule 11   rosuvastatin (CRESTOR) 10 MG tablet Take 10 mg by mouth daily. (Patient not taking: Reported on 05/10/2023)     No current facility-administered medications for this visit.    Allergies   Allergies as of 05/10/2023   (No Known Allergies)        Review of Systems   General: Negative for anorexia, weight loss, fever, chills, fatigue, weakness. ENT: Negative for hoarseness, difficulty swallowing , nasal congestion. CV: Negative for chest pain, angina,  palpitations, dyspnea on exertion, peripheral edema.  Respiratory: Negative for dyspnea at rest, dyspnea on exertion, cough, sputum, wheezing.  GI: See history of present illness. GU:  Negative for dysuria, hematuria, urinary incontinence, urinary frequency, nocturnal urination.  Endo: Negative for unusual weight change.     Physical Exam   BP 132/84 (BP Location: Left Arm, Patient Position: Sitting, Cuff Size: Normal)   Pulse 93   Temp 98.6 F (37 C) (Oral)   Ht 5\' 7"  (1.702 m)   Wt 142 lb 6.4 oz (64.6 kg)   LMP 12/06/2013   SpO2 100%   BMI 22.30 kg/m    General: Well-nourished, well-developed in no acute distress.  Eyes: No icterus. Mouth: Oropharyngeal mucosa moist and pink  Lungs: Clear to auscultation bilaterally.  Heart: Regular rate and rhythm, no murmurs rubs or gallops.  Abdomen: Bowel sounds are normal, nontender, nondistended, no hepatosplenomegaly,  no abdominal bruits or hernia , no rebound or guarding. Fullness in the LLQ at location of abdominal incision. No discrete mass but full, hardened. Rectal: not performed  Extremities: No lower extremity edema. No clubbing or deformities. Neuro: Alert and oriented x 4   Skin: Warm and dry, no jaundice.   Psych: Alert and cooperative, normal mood and affect.  Labs   Lab Results  Component Value Date   NA 135 04/03/2023   CL 107 04/03/2023   K 3.5 04/03/2023   CO2 22 04/03/2023   BUN 11 04/03/2023   CREATININE 0.59 04/03/2023   GFRNONAA >60 04/03/2023   CALCIUM 8.5 (L) 04/03/2023   ALBUMIN 3.7 04/03/2023   GLUCOSE 151 (H) 04/03/2023   Lab Results  Component Value Date   ALT 16 04/03/2023   AST 27 04/03/2023   ALKPHOS 75 04/03/2023   BILITOT 0.6 04/03/2023   Lab Results  Component Value Date   WBC 3.5 (L) 04/03/2023   HGB 11.5 (L) 04/03/2023   HCT 35.1 (L) 04/03/2023   MCV 96.4 04/03/2023   PLT 237 04/03/2023   Labs from March 28, 2023: Total bilirubin 0.3, alkaline phosphatase 94, AST 23, ALT 9, total  cholesterol 213, LDL 103, B12 1134, folate greater than 20, TSH 1.850, vitamin D 38.6, sed rate 12, white blood cell count 3500, hemoglobin 12.1, MCV 95, platelets 283,000, creatinine 0.68, albumin 4.3.  Imaging Studies   No results found.  Assessment   *GERD *NSAID induced gastritis *Constipation *LLQ abdominal fullness/mass  Patient reports not taking any of her medications regularly. Reported that her mind won't let her. Forgets to take it. She states she believes she is on way to much. I have suggested that she see her PCP to "regroup" and determine what she should be on. Some of her medications clearly are not prn medications.  Constipation doing okay at this time. Using amitiza only as needed.  No longer on PPI, would recommend restarting given gastritis and possible ongoing celebrex use.  Complains of abdominal rolling/movement likely in part due to IBS/gas. Trial of probiotics planned.  LLQ abdominal wall fullness/hardness at site of her prior incision. Etiology unclea   PLAN     Follow up with PCP to discuss medications. Continue amitiza bid prn constipation. Restart omeprazole 20mg  daily. Add probiotic for four weeks. Provided her with some Restora samples.  CT A/P with IV contrast.  Leanna Battles. Melvyn Neth, MHS, PA-C [facility]

## 2023-05-10 NOTE — Addendum Note (Signed)
Addended by: Elinor Dodge on: 05/10/2023 02:15 PM   Modules accepted: Orders

## 2023-05-19 ENCOUNTER — Ambulatory Visit (HOSPITAL_COMMUNITY): Payer: Medicare HMO

## 2023-06-12 DIAGNOSIS — N390 Urinary tract infection, site not specified: Secondary | ICD-10-CM | POA: Diagnosis not present

## 2023-06-12 DIAGNOSIS — Z6822 Body mass index (BMI) 22.0-22.9, adult: Secondary | ICD-10-CM | POA: Diagnosis not present

## 2023-06-12 DIAGNOSIS — R3911 Hesitancy of micturition: Secondary | ICD-10-CM | POA: Diagnosis not present

## 2023-06-12 DIAGNOSIS — R109 Unspecified abdominal pain: Secondary | ICD-10-CM | POA: Diagnosis not present

## 2023-06-12 DIAGNOSIS — I10 Essential (primary) hypertension: Secondary | ICD-10-CM | POA: Diagnosis not present

## 2023-06-20 ENCOUNTER — Other Ambulatory Visit: Payer: Self-pay

## 2023-06-20 ENCOUNTER — Encounter (HOSPITAL_COMMUNITY): Payer: Self-pay | Admitting: Emergency Medicine

## 2023-06-20 ENCOUNTER — Emergency Department (HOSPITAL_COMMUNITY)
Admission: EM | Admit: 2023-06-20 | Discharge: 2023-06-20 | Disposition: A | Discharge status: Home / Self Care | Payer: Medicare HMO | Attending: Emergency Medicine | Admitting: Emergency Medicine

## 2023-06-20 ENCOUNTER — Emergency Department (HOSPITAL_COMMUNITY): Payer: Medicare HMO

## 2023-06-20 DIAGNOSIS — R1010 Upper abdominal pain, unspecified: Secondary | ICD-10-CM | POA: Diagnosis not present

## 2023-06-20 DIAGNOSIS — R1013 Epigastric pain: Secondary | ICD-10-CM | POA: Diagnosis not present

## 2023-06-20 DIAGNOSIS — R9431 Abnormal electrocardiogram [ECG] [EKG]: Secondary | ICD-10-CM | POA: Diagnosis not present

## 2023-06-20 DIAGNOSIS — Q438 Other specified congenital malformations of intestine: Secondary | ICD-10-CM | POA: Diagnosis not present

## 2023-06-20 DIAGNOSIS — K589 Irritable bowel syndrome without diarrhea: Secondary | ICD-10-CM | POA: Diagnosis not present

## 2023-06-20 LAB — COMPREHENSIVE METABOLIC PANEL
ALT: 17 U/L (ref 0–44)
AST: 22 U/L (ref 15–41)
Albumin: 4 g/dL (ref 3.5–5.0)
Alkaline Phosphatase: 71 U/L (ref 38–126)
Anion gap: 9 (ref 5–15)
BUN: 9 mg/dL (ref 8–23)
CO2: 26 mmol/L (ref 22–32)
Calcium: 9 mg/dL (ref 8.9–10.3)
Chloride: 100 mmol/L (ref 98–111)
Creatinine, Ser: 0.6 mg/dL (ref 0.44–1.00)
GFR, Estimated: >60 mL/min (ref >60)
Glucose, Bld: 100 mg/dL — ABNORMAL HIGH (ref 70–99)
Potassium: 3.4 mmol/L — ABNORMAL LOW (ref 3.5–5.1)
Sodium: 135 mmol/L (ref 135–145)
Total Bilirubin: 0.4 mg/dL (ref 0.3–1.2)
Total Protein: 7.4 g/dL (ref 6.5–8.1)

## 2023-06-20 LAB — CBC
HCT: 36.3 % (ref 36.0–46.0)
Hemoglobin: 11.8 g/dL — ABNORMAL LOW (ref 12.0–15.0)
MCH: 31.4 pg (ref 26.0–34.0)
MCHC: 32.5 g/dL (ref 30.0–36.0)
MCV: 96.5 fL (ref 80.0–100.0)
Platelets: 237 10*3/uL (ref 150–400)
RBC: 3.76 MIL/uL — ABNORMAL LOW (ref 3.87–5.11)
RDW: 13.9 % (ref 11.5–15.5)
WBC: 3.8 10*3/uL — ABNORMAL LOW (ref 4.0–10.5)
nRBC: 0 % (ref 0.0–0.2)

## 2023-06-20 LAB — URINALYSIS, ROUTINE W REFLEX MICROSCOPIC
Bilirubin Urine: NEGATIVE
Glucose, UA: NEGATIVE mg/dL
Hgb urine dipstick: NEGATIVE
Ketones, ur: NEGATIVE mg/dL
Leukocytes,Ua: NEGATIVE
Nitrite: NEGATIVE
Protein, ur: NEGATIVE mg/dL
Specific Gravity, Urine: 1.015 (ref 1.005–1.030)
pH: 6 (ref 5.0–8.0)

## 2023-06-20 LAB — LIPASE, BLOOD: Lipase: 23 U/L (ref 11–51)

## 2023-06-20 MED ORDER — IOHEXOL 300 MG/ML  SOLN
100.0000 mL | Freq: Once | INTRAMUSCULAR | Status: AC | PRN
  Administered 2023-06-20: 100 mL via INTRAVENOUS

## 2023-06-20 MED ORDER — PANTOPRAZOLE SODIUM 40 MG IV SOLR
40.0000 mg | Freq: Once | INTRAVENOUS | Status: AC
  Administered 2023-06-20: 40 mg via INTRAVENOUS
  Filled 2023-06-20: qty 10

## 2023-06-20 MED ORDER — PANTOPRAZOLE SODIUM 20 MG PO TBEC: 20.0000 mg | DELAYED_RELEASE_TABLET | Freq: Every day | ORAL | 0 refills | Status: DC

## 2023-06-20 NOTE — Discharge Instructions (Signed)
Follow-up with Dr. Levon Hedger in the next couple weeks or you can see one of his colleagues

## 2023-06-20 NOTE — ED Provider Notes (Signed)
[place] [facility]PARTMENT AT [facility] Provider Note   CSN: [id number] Arrival date & time: 06/20/23  1478     History {Add pertinent medical, surgical, social history, OB history to HPI:1} Chief Complaint  Patient presents with   Abdominal Pain    [name] is a 62 y.o. female.  Patient with abdominal pain and weight loss for a number of weeks.   Abdominal Pain      Home Medications Prior to Admission medications   Medication Sig Start Date End Date Taking? Authorizing Provider  pantoprazole (PROTONIX) 20 MG tablet Take 1 tablet (20 mg total) by mouth daily. 06/20/23  Yes Bethann Berkshire, MD  ALPRAZolam Prudy Feeler) 0.5 MG tablet Take 0.5 mg by mouth 3 (three) times daily as needed for anxiety.    [provider]  amitriptyline (ELAVIL) 50 MG tablet Take 50 mg by mouth at bedtime.    [provider]  buPROPion (WELLBUTRIN XL) 150 MG 24 hr tablet Take 150 mg by mouth daily. Patient not taking: Reported on 05/10/2023 03/28/23   [provider]  calcium carbonate (OSCAL) 1500 (600 Ca) MG TABS tablet Take 600 mg by mouth in the morning.    [provider]  celecoxib (CELEBREX) 200 MG capsule Take 200 mg by mouth in the morning. 11/16/22   [provider]  cholecalciferol (GNP VITAMIN D3) 10 MCG (400 UNIT) TABS tablet Take 400 Units by mouth in the morning.    [provider]  cyclobenzaprine (FLEXERIL) 10 MG tablet Take 10 mg by mouth 3 (three) times daily as needed for muscle spasms. 10/21/22   [provider]  dicyclomine (BENTYL) 10 MG capsule Take 10 mg by mouth 4 (four) times daily as needed for spasms. 12/09/22   [provider]  HYDROcodone-acetaminophen (NORCO) 10-325 MG tablet Take 1 tablet by mouth every 6 (six) hours as needed.    [provider]  iron polysaccharides (NIFEREX) 150 MG capsule Take 150 mg by mouth daily as needed (low iron).    [provider]  lubiprostone  (AMITIZA) 24 MCG capsule Take 1 capsule (24 mcg total) by mouth 2 (two) times daily with a meal. Patient taking differently: Take 24 mcg by mouth 2 (two) times daily as needed (constipation.). 01/09/23   Tiffany Kocher, PA-C  omeprazole (PRILOSEC) 20 MG capsule TAKE (1) CAPSULE BY MOUTH ONCE DAILY 30 MINUTES PRIOR TO FIRST MEAL. 01/11/23   Tiffany Kocher, PA-C  rosuvastatin (CRESTOR) 10 MG tablet Take 10 mg by mouth daily. 03/30/23   [provider]      Allergies    Patient has no known allergies.    Review of Systems   Review of Systems  Gastrointestinal:  Positive for abdominal pain.    Physical Exam Updated Vital Signs BP 115/81 (BP Location: Left Arm)   Pulse 85   Temp 98.1 F (36.7 C) (Oral)   Resp 20   Ht 5\' 7"  (1.702 m)   Wt 63.5 kg   LMP 12/06/2013   SpO2 99%   BMI 21.93 kg/m  Physical Exam  ED Results / Procedures / Treatments   Labs (all labs ordered are listed, but only abnormal results are displayed) Labs Reviewed  COMPREHENSIVE METABOLIC PANEL - Abnormal; Notable for the following components:      Result Value   Potassium 3.4 (*)    Glucose, Bld 100 (*)    All other components within normal limits  CBC - Abnormal; Notable for the  following components:   WBC 3.8 (*)    RBC 3.76 (*)    Hemoglobin 11.8 (*)    All other components within normal limits  LIPASE, BLOOD  URINALYSIS, ROUTINE W REFLEX MICROSCOPIC    EKG EKG Interpretation Date/Time:  Tuesday June 20 2023 09:00:44 EDT Ventricular Rate:  82 PR Interval:  151 QRS Duration:  98 QT Interval:  400 QTC Calculation: 468 R Axis:   66  Text Interpretation: Sinus rhythm Consider left atrial enlargement Confirmed by Bethann Berkshire [phone]) on 06/20/2023 1:24:41 PM  Radiology CT ABDOMEN PELVIS W CONTRAST  Result Date: 06/20/2023 CLINICAL DATA:  Abdominal pain for a month. Previous UTI. Some blood in stool. Loss of appetite and weight loss. History of IBS EXAM: CT ABDOMEN AND PELVIS WITH  CONTRAST TECHNIQUE: Multidetector CT imaging of the abdomen and pelvis was performed using the standard protocol following bolus administration of intravenous contrast. RADIATION DOSE REDUCTION: This exam was performed according to the departmental dose-optimization program which includes automated exposure control, adjustment of the mA and/or kV according to patient size and/or use of iterative reconstruction technique. CONTRAST:  OMNIPAQUE IOHEXOL 300 MG/ML  SOLN COMPARISON:  CT abdomen pelvis November 2019 FINDINGS: Lower chest: There is some breathing motion at the lung bases. No pleural effusion. Hepatobiliary: No focal liver abnormality is seen. No gallstones, gallbladder wall thickening, or biliary dilatation. Pancreas: Unremarkable. No pancreatic ductal dilatation or surrounding inflammatory changes. Spleen: Normal in size without focal abnormality. Adrenals/Urinary Tract: Adrenal glands are preserved. No enhancing renal mass or collecting system dilatation. Preserved contours of the urinary bladder. Stomach/Bowel: Large amount of diffuse colonic stool. Redundant course of the colon with cecum in the left midabdomen as seen previously. Small bowel is nondilated. Stomach is nondilated. Vascular/Lymphatic: Normal caliber aorta and IVC with mild vascular calcifications. There is some edema along the retroperitoneum. This has not seen previously. Reproductive: Uterus and bilateral adnexa are unremarkable. Other: No free intra-air. No free fluid. There is some linear thickening along the subcutaneous fat along the anterior aspect of the left hemipelvis. Please correlate with any previous intervention or other process. Musculoskeletal: Since the prior CT of the abdomen pelvis there has been new anterior hardware fusion at L4-5. Multilevel degenerative changes. Please correlate with the time course of surgery from December 2023. IMPRESSION: Large amount of diffuse colonic stool. No bowel obstruction. Of note  once again the cecum is in the left midabdomen, unchanged from previous. Retroperitoneal stranding and mild edema identified of uncertain etiology and significance. Please correlate with clinical presentation. There is placement of anterior fusion at L4-5 of the spine since December 2023. overall would recommend short follow up evaluation and correlate with specific clinical presentation Electronically Signed   By: Karen Kays M.D.   On: 06/20/2023 12:41    Procedures Procedures  {Document cardiac monitor, telemetry assessment procedure when appropriate:1}  Medications Ordered in ED Medications  pantoprazole (PROTONIX) injection 40 mg (40 mg Intravenous Given 06/20/23 0936)  iohexol (OMNIPAQUE) 300 MG/ML solution 100 mL (100 mLs Intravenous Contrast Given 06/20/23 1022)    ED Course/ Medical Decision Making/ A&P   {   Click here for ABCD2, HEART and other calculatorsREFRESH Note before signing :1}                              Medical Decision Making Amount and/or Complexity of Data Reviewed Labs: ordered. Radiology: ordered.  Risk Prescription drug management.   Patient  with weight loss, gastritis and constipation.  She is given Protonix and will follow-up with GI  {Document critical care time when appropriate:1} {Document review of labs and clinical decision tools ie heart score, Chads2Vasc2 etc:1}  {Document your independent review of radiology images, and any outside records:1} {Document your discussion with family members, caretakers, and with consultants:1} {Document social determinants of health affecting pt's care:1} {Document your decision making why or why not admission, treatments were needed:1} Final Clinical Impression(s) / ED Diagnoses Final diagnoses:  Pain of upper abdomen    Rx / DC Orders ED Discharge Orders          Ordered    pantoprazole (PROTONIX) 20 MG tablet  Daily        06/20/23 1325

## 2023-06-20 NOTE — ED Triage Notes (Signed)
Pt presents with abdominal pain x 1 month, place on antibiotics for possible UTI 06/12/23, also having blood in stools, loss of appetite, and unexplained weight loss, history of IBS.

## 2023-07-03 ENCOUNTER — Ambulatory Visit: Payer: Medicare HMO | Admitting: Gastroenterology

## 2023-07-03 ENCOUNTER — Encounter: Payer: Self-pay | Admitting: Gastroenterology

## 2023-07-03 VITALS — BP 119/81 | HR 94 | Temp 98.1°F | Ht 67.0 in | Wt 140.6 lb

## 2023-07-03 DIAGNOSIS — R634 Abnormal weight loss: Secondary | ICD-10-CM | POA: Diagnosis not present

## 2023-07-03 DIAGNOSIS — K5909 Other constipation: Secondary | ICD-10-CM

## 2023-07-03 DIAGNOSIS — R630 Anorexia: Secondary | ICD-10-CM

## 2023-07-03 DIAGNOSIS — Z8719 Personal history of other diseases of the digestive system: Secondary | ICD-10-CM | POA: Diagnosis not present

## 2023-07-03 DIAGNOSIS — K589 Irritable bowel syndrome without diarrhea: Secondary | ICD-10-CM | POA: Insufficient documentation

## 2023-07-03 DIAGNOSIS — K581 Irritable bowel syndrome with constipation: Secondary | ICD-10-CM

## 2023-07-03 DIAGNOSIS — R609 Edema, unspecified: Secondary | ICD-10-CM

## 2023-07-03 DIAGNOSIS — R935 Abnormal findings on diagnostic imaging of other abdominal regions, including retroperitoneum: Secondary | ICD-10-CM

## 2023-07-03 DIAGNOSIS — K219 Gastro-esophageal reflux disease without esophagitis: Secondary | ICD-10-CM | POA: Diagnosis not present

## 2023-07-03 MED ORDER — LUBIPROSTONE 8 MCG PO CAPS: 8.0000 ug | ORAL_CAPSULE | Freq: Two times a day (BID) | ORAL | 11 refills | Status: DC

## 2023-07-03 NOTE — Progress Notes (Signed)
GI Office Note    Referring Provider: Elfredia Nevins, MD Primary Care Physician:  Elfredia Nevins, MD  Primary Gastroenterologist: Hennie Duos. Marletta Lor, DO   Chief Complaint   Chief Complaint  Patient presents with   ER Follow up    Was seen in the ER for abdominal pain, still having issues.     History of Present Illness   [name] is a 62 y.o. female presenting today for follow up for abdominal pain. Last seen 05/2023. CT ordered after last ov but not completed.  Seen in ED last month at which time CT was completed.  She had large amt of diffuse colonic stool, cecum in left midabdomen unchanged. There was retroperitoneal stranding and mild edema identified of uncertain etiology and significant, also noting placement of anterior fusion at L4-5 of spine since 08/2022, overall would recommend short follow up evaluation and correlate with specific clinical presentation.   She completed EGD and colonoscopy June 2024. She was noted to have gastritis (focal mild chronic inactive gastritis but no H. pylori) nonbleeding internal hemorrhoids. Plans to repeat colonoscopy in 5 years because of personal history of adenomatous colon polyps.   Today:  No abdominal pain. Stomach is always rumbling, movement in the belly. This is getting on her nerves. She is also having trouble sleeping because the neighbors dog barks constantly and she is not sure what to do about it. Stresses her out. She is having a BM about 1-2 times per week. Never been regular. At times stools can be hard. She thought her bowels were moving adequately enough though. Recent CT with large stool burden. Some days no appetite. No heartburn. In ED, started on pantoprazole 20mg  daily. Feels a little nausea. No recent blood in stool, melena. Has been off [employer]. Finds if she has to take something to move her bowels she will generally have to stay at home. No melena, brbpr. Appetite is not good. Doesn't get hungry. Weight is  down 5 pounds in past four months.     Wt Readings from Last 10 Encounters:  07/03/23 140 lb 9.6 oz (63.8 kg)  06/20/23 140 lb (63.5 kg)  05/10/23 142 lb 6.4 oz (64.6 kg)  04/03/23 143 lb (64.9 kg)  02/07/23 145 lb (65.8 kg)  01/09/23 141 lb 3.2 oz (64 kg)  08/25/22 143 lb (64.9 kg)  08/18/22 143 lb (64.9 kg)  08/08/22 146 lb (66.2 kg)  08/05/22 146 lb (66.2 kg)    Medications   Current Outpatient Medications  Medication Sig Dispense Refill   ALPRAZolam (XANAX) 0.5 MG tablet Take 0.5 mg by mouth 3 (three) times daily as needed for anxiety.     amitriptyline (ELAVIL) 50 MG tablet Take 50 mg by mouth at bedtime.     bethanechol (URECHOLINE) 5 MG tablet Take 5 mg by mouth 3 (three) times daily.     calcium carbonate (OSCAL) 1500 (600 Ca) MG TABS tablet Take 600 mg by mouth in the morning.     cholecalciferol (GNP VITAMIN D3) 10 MCG (400 UNIT) TABS tablet Take 400 Units by mouth in the morning.     cyclobenzaprine (FLEXERIL) 10 MG tablet Take 10 mg by mouth 3 (three) times daily as needed for muscle spasms.     escitalopram (LEXAPRO) 20 MG tablet Take 20 mg by mouth daily as needed.     HYDROcodone-acetaminophen (NORCO) 10-325 MG tablet Take 1 tablet by mouth every 6 (six) hours as needed.     iron  polysaccharides (NIFEREX) 150 MG capsule Take 150 mg by mouth daily as needed (low iron).            pantoprazole (PROTONIX) 20 MG tablet Take 1 tablet (20 mg total) by mouth daily. 30 tablet 0   rosuvastatin (CRESTOR) 10 MG tablet Take 10 mg by mouth daily.     venlafaxine XR (EFFEXOR-XR) 150 MG 24 hr capsule Take 150 mg by mouth daily.     No current facility-administered medications for this visit.    Allergies   Allergies as of 07/03/2023   (No Known Allergies)         Review of Systems   General: Negative for anorexia, weight loss, fever, chills, fatigue, weakness. ENT: Negative for hoarseness, difficulty swallowing , nasal congestion. CV: Negative for chest pain, angina,  palpitations, dyspnea on exertion, peripheral edema.  Respiratory: Negative for dyspnea at rest, dyspnea on exertion, cough, sputum, wheezing.  GI: See history of present illness. GU:  Negative for dysuria, hematuria, urinary incontinence, urinary frequency, nocturnal urination.  Endo: Negative for unusual weight change.     Physical Exam   BP 119/81 (BP Location: Right Arm, Patient Position: Sitting, Cuff Size: Normal)   Pulse 94   Temp 98.1 F (36.7 C) (Oral)   Ht 5\' 7"  (1.702 m)   Wt 140 lb 9.6 oz (63.8 kg)   LMP 12/06/2013   SpO2 96%   BMI 22.02 kg/m    General: Well-nourished, well-developed in no acute distress.  Eyes: No icterus. Mouth: Oropharyngeal mucosa moist and pink   Heart: Regular rate and rhythm, no murmurs rubs or gallops.  Abdomen: Bowel sounds are normal, nontender, nondistended, no hepatosplenomegaly or masses,  no abdominal bruits or hernia , no rebound or guarding.  Rectal: not performed  Extremities: No lower extremity edema. No clubbing or deformities. Neuro: Alert and oriented x 4   Skin: Warm and dry, no jaundice.   Psych: Alert and cooperative, normal mood and affect.  Labs   Lab Results  Component Value Date   NA 135 06/20/2023   CL 100 06/20/2023   K 3.4 (L) 06/20/2023   CO2 26 06/20/2023   BUN 9 06/20/2023   CREATININE 0.60 06/20/2023   GFRNONAA >60 06/20/2023   CALCIUM 9.0 06/20/2023   ALBUMIN 4.0 06/20/2023   GLUCOSE 100 (H) 06/20/2023   Lab Results  Component Value Date   ALT 17 06/20/2023   AST 22 06/20/2023   ALKPHOS 71 06/20/2023   BILITOT 0.4 06/20/2023   Lab Results  Component Value Date   LIPASE 23 06/20/2023   Lab Results  Component Value Date   WBC 3.8 (L) 06/20/2023   HGB 11.8 (L) 06/20/2023   HCT 36.3 06/20/2023   MCV 96.5 06/20/2023   PLT 237 06/20/2023    Imaging Studies   CT ABDOMEN PELVIS W CONTRAST  Result Date: 06/20/2023 CLINICAL DATA:  Abdominal pain for a month. Previous UTI. Some blood in  stool. Loss of appetite and weight loss. History of IBS EXAM: CT ABDOMEN AND PELVIS WITH CONTRAST TECHNIQUE: Multidetector CT imaging of the abdomen and pelvis was performed using the standard protocol following bolus administration of intravenous contrast. RADIATION DOSE REDUCTION: This exam was performed according to the departmental dose-optimization program which includes automated exposure control, adjustment of the mA and/or kV according to patient size and/or use of iterative reconstruction technique. CONTRAST:  OMNIPAQUE IOHEXOL 300 MG/ML  SOLN COMPARISON:  CT abdomen pelvis November 2019 FINDINGS: Lower chest: There is some  breathing motion at the lung bases. No pleural effusion. Hepatobiliary: No focal liver abnormality is seen. No gallstones, gallbladder wall thickening, or biliary dilatation. Pancreas: Unremarkable. No pancreatic ductal dilatation or surrounding inflammatory changes. Spleen: Normal in size without focal abnormality. Adrenals/Urinary Tract: Adrenal glands are preserved. No enhancing renal mass or collecting system dilatation. Preserved contours of the urinary bladder. Stomach/Bowel: Large amount of diffuse colonic stool. Redundant course of the colon with cecum in the left midabdomen as seen previously. Small bowel is nondilated. Stomach is nondilated. Vascular/Lymphatic: Normal caliber aorta and IVC with mild vascular calcifications. There is some edema along the retroperitoneum. This has not seen previously. Reproductive: Uterus and bilateral adnexa are unremarkable. Other: No free intra-air. No free fluid. There is some linear thickening along the subcutaneous fat along the anterior aspect of the left hemipelvis. Please correlate with any previous intervention or other process. Musculoskeletal: Since the prior CT of the abdomen pelvis there has been new anterior hardware fusion at L4-5. Multilevel degenerative changes. Please correlate with the time course of surgery from  December 2023. IMPRESSION: Large amount of diffuse colonic stool. No bowel obstruction. Of note once again the cecum is in the left midabdomen, unchanged from previous. Retroperitoneal stranding and mild edema identified of uncertain etiology and significance. Please correlate with clinical presentation. There is placement of anterior fusion at L4-5 of the spine since December 2023. overall would recommend short follow up evaluation and correlate with specific clinical presentation Electronically Signed   By: Karen Kays M.D.   On: 06/20/2023 12:41    Assessment/Plan:   GERD/gastritis -patient off PPI at some point. No heartburn. Agree with coverage with PPI for next couple of months given history of gastritis  Constipation -chronic constipation with history of IBS -complains of excessive rumbling/peristalsis, feeling unsteady, CT reassuring. She does have cecum in left midabdomen unchanged from 2019 CT.  -large stool burden on recent CT. Reports 1-2 stools weekly. -encouraged amitiza BID. Will see more immediate relief with constipation, other benefits for IBS discomfort occur much later. Encouraged her to stick with medication to see if her symptoms settle down. She will reach out in one month with a progress report. Could consider empiric treatment for SIBO if still has concerns for excessive gas/rumbling etc.  Weight loss -appetite poor -encouraged adequate calories to maintain weight -return ov in 2 months.   CT with retroperitoneal stranding and mild edema of uncertain etiology and significance, ?related to anterior fusion back in 08/2022. To discuss with Dr. Marletta Lor.       Leanna Battles. Melvyn Neth, MHS, PA-C [facility]

## 2023-07-03 NOTE — Patient Instructions (Signed)
Continue pantoprazole 20mg  daily for at least 8 weeks before stopping. Start lubiprostone (amitiza) for IBS and constipation. Take with food twice daily. At first you may have several stools or loose stools each day but that should improve after you have been on medication for 1-2 weeks. After being on medication for 6-8 weeks, you will notice maximum benefit of bloating, abdominal discomfort due to IBS.  Try to continue to eat adequate enough to maintain your weight.  Call with progress report in 3-4 weeks.  Return office visit in 2 months.

## 2023-07-14 ENCOUNTER — Telehealth: Payer: Self-pay | Admitting: Gastroenterology

## 2023-07-14 NOTE — Telephone Encounter (Signed)
Please let pt know that I spoke with Dr. Marletta Lor about the retroperitoneal stranding and mild edema noted in lower abdomen on CT last month, he is advising repeat CT in 09/2023 to follow up on this finding. Plan for CT A/P with contrast.  09/2023.   Please NIC.

## 2023-07-17 ENCOUNTER — Ambulatory Visit: Payer: Medicare HMO | Admitting: Gastroenterology

## 2023-07-17 NOTE — Progress Notes (Deleted)
GI Office Note    Referring Provider: Elfredia Nevins, MD Primary Care Physician:  Elfredia Nevins, MD  Primary Gastroenterologist: Hennie Duos. Marletta Lor, DO   Chief Complaint   No chief complaint on file.   History of Present Illness   [name] is a 62 y.o. female presenting today for follow up. Last seen 10/24.   Seen in ED last month at which time CT was completed.  She had large amt of diffuse colonic stool, cecum in left midabdomen unchanged. There was retroperitoneal stranding and mild edema identified of uncertain etiology and significant, also noting placement of anterior fusion at L4-5 of spine since 08/2022, overall would recommend short follow up evaluation and correlate with specific clinical presentation.    She completed EGD and colonoscopy June 2024. She was noted to have gastritis (focal mild chronic inactive gastritis but no H. pylori) nonbleeding internal hemorrhoids. Plans to repeat colonoscopy in 5 years because of personal history of adenomatous colon polyps.       Medications   Current Outpatient Medications  Medication Sig Dispense Refill   ALPRAZolam (XANAX) 0.5 MG tablet Take 0.5 mg by mouth 3 (three) times daily as needed for anxiety.     amitriptyline (ELAVIL) 50 MG tablet Take 50 mg by mouth at bedtime.     bethanechol (URECHOLINE) 5 MG tablet Take 5 mg by mouth 3 (three) times daily.     calcium carbonate (OSCAL) 1500 (600 Ca) MG TABS tablet Take 600 mg by mouth in the morning.     cholecalciferol (GNP VITAMIN D3) 10 MCG (400 UNIT) TABS tablet Take 400 Units by mouth in the morning.     cyclobenzaprine (FLEXERIL) 10 MG tablet Take 10 mg by mouth 3 (three) times daily as needed for muscle spasms.     escitalopram (LEXAPRO) 20 MG tablet Take 20 mg by mouth daily as needed.     HYDROcodone-acetaminophen (NORCO) 10-325 MG tablet Take 1 tablet by mouth every 6 (six) hours as needed.     iron polysaccharides (NIFEREX) 150 MG capsule Take 150 mg by mouth  daily as needed (low iron).     lubiprostone (AMITIZA) 8 MCG capsule Take 1 capsule (8 mcg total) by mouth 2 (two) times daily with a meal. 60 capsule 11   pantoprazole (PROTONIX) 20 MG tablet Take 1 tablet (20 mg total) by mouth daily. 30 tablet 0   rosuvastatin (CRESTOR) 10 MG tablet Take 10 mg by mouth daily.     venlafaxine XR (EFFEXOR-XR) 150 MG 24 hr capsule Take 150 mg by mouth daily.     No current facility-administered medications for this visit.    Allergies   Allergies as of 07/17/2023   (No Known Allergies)     Past Medical History   Past Medical History:  Diagnosis Date   Anemia    Arthritis    Breast cancer (HCC)    right breast cancer 5 yrs ago   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   APPENDIX IN LUQ   Depression    Dysrhythmia    hx palpitations-too much caffiene   Headache    patient denies this dx   Scoliosis     Past Surgical History   Past Surgical History:  Procedure Laterality Date   ABDOMINAL EXPOSURE N/A 08/25/2022   Procedure: ABDOMINAL EXPOSURE;  Surgeon: Cephus Shelling, MD;  Location: [facility] OR;  Service: Vascular;  Laterality: N/A;   ANKLE SURGERY     left ankle cyst  ANTERIOR LUMBAR FUSION N/A 08/25/2022   Procedure: ANTERIOR LUMBAR FOUR TO FIVE INTERBODY FUSION, ILIAC CRST BONE GRAFT HARVEST;  Surgeon: Venita Lick, MD;  Location: [facility] OR;  Service: Orthopedics;  Laterality: N/A;  4 HRS DR. CLARK TO DO APPROACH 3 C-BED   BACK SURGERY  2000   lumbar disc surgery   BIOPSY  01/07/2020   Procedure: BIOPSY;  Surgeon: West Bali, MD;  Location: [facility];  Service: Endoscopy;;   BIOPSY  02/07/2023   Procedure: BIOPSY;  Surgeon: Lanelle Bal, DO;  Location: [facility];  Service: Endoscopy;;   BREAST EXCISIONAL BIOPSY Left    benign   BREAST SURGERY Left    left partail mastectomy-cyst   CERVICAL DISC SURGERY  1999   anterior and a posterier cerv fusionx2   CESAREAN SECTION     x2   COLONOSCOPY     COLONOSCOPY  WITH PROPOFOL N/A 06/26/2018   Procedure: COLONOSCOPY WITH PROPOFOL;  Surgeon: West Bali, MD;  Location: [facility];  Service: Endoscopy;  Laterality: N/A;  11:00am   COLONOSCOPY WITH PROPOFOL N/A 02/07/2023   Procedure: COLONOSCOPY WITH PROPOFOL;  Surgeon: Lanelle Bal, DO;  Location: [facility];  Service: Endoscopy;  Laterality: N/A;  115pm, asa 2   DILATION AND CURETTAGE OF UTERUS     DILITATION & CURRETTAGE/HYSTROSCOPY WITH THERMACHOICE ABLATION  07/10/2012   Procedure: DILATATION & CURETTAGE/HYSTEROSCOPY WITH THERMACHOICE ABLATION;  Surgeon: [name], MD;  Location: [facility];  Service: Gynecology;  Laterality: N/A;  D5 13ml in, 13 ml out, temp 87degree celcius, total therapy time 16sec   ERCP     ESOPHAGOGASTRODUODENOSCOPY (EGD) WITH PROPOFOL N/A 01/07/2020   Procedure: ESOPHAGOGASTRODUODENOSCOPY (EGD) WITH PROPOFOL;  Surgeon: West Bali, MD;  Location: [facility];  Service: Endoscopy;  Laterality: N/A;  2:00pm   ESOPHAGOGASTRODUODENOSCOPY (EGD) WITH PROPOFOL N/A 02/07/2023   Procedure: ESOPHAGOGASTRODUODENOSCOPY (EGD) WITH PROPOFOL;  Surgeon: Lanelle Bal, DO;  Location: [facility];  Service: Endoscopy;  Laterality: N/A;   EYE SURGERY     HARVEST BONE GRAFT N/A 08/25/2022   Procedure: HARVEST ILIAC BONE GRAFT;  Surgeon: Venita Lick, MD;  Location: [facility] OR;  Service: Orthopedics;  Laterality: N/A;   POLYPECTOMY  06/26/2018   Procedure: POLYPECTOMY;  Surgeon: West Bali, MD;  Location: [facility];  Service: Endoscopy;;  colon   TUBAL LIGATION     with last c-section    Past Family History   Family History  Problem Relation Age of Onset   Hypertension Mother    Colon cancer Neg Hx    Gastric cancer Neg Hx    Esophageal cancer Neg Hx     Past Social History   Social History   Socioeconomic History   Marital status: Married    Spouse name: Not on file   Number of children: 2   Years of education: Not on file   Highest education  level: Not on file  Occupational History   Not on file  Tobacco Use   Smoking status: Never   Smokeless tobacco: Never  Vaping Use   Vaping status: Never Used  Substance and Sexual Activity   Alcohol use: No   Drug use: No   Sexual activity: Yes    Birth control/protection: Surgical, Post-menopausal    Comment: tubal ligation & ablation  Other Topics Concern   Not on file  Social History Narrative   Not on file   Social Determinants of  Health   Financial Resource Strain: Medium Risk (08/08/2022)   Overall Financial Resource Strain (CARDIA)    Difficulty of Paying Living Expenses: Somewhat hard  Food Insecurity: Food Insecurity Present (08/08/2022)   Hunger Vital Sign    Worried About Running Out of Food in the Last Year: Sometimes true    Ran Out of Food in the Last Year: Often true  Transportation Needs: No Transportation Needs (08/08/2022)   PRAPARE - Administrator, Civil Service (Medical): No    Lack of Transportation (Non-Medical): No  Physical Activity: Inactive (08/08/2022)   Exercise Vital Sign    Days of Exercise per Week: 0 days    Minutes of Exercise per Session: 0 min  Stress: No Stress Concern Present (08/08/2022)   [employer] of Occupational Health - Occupational Stress Questionnaire    Feeling of Stress : Not at all  Social Connections: Moderately Integrated (08/08/2022)   Social Connection and Isolation Panel [NHANES]    Frequency of Communication with Friends and Family: More than three times a week    Frequency of Social Gatherings with Friends and Family: Three times a week    Attends Religious Services: 1 to 4 times per year    Active Member of Clubs or Organizations: No    Attends Banker Meetings: Never    Marital Status: Married  Catering manager Violence: Not At Risk (08/08/2022)   Humiliation, Afraid, Rape, and Kick questionnaire    Fear of Current or Ex-Partner: No    Emotionally Abused: No    Physically Abused: No     Sexually Abused: No    Review of Systems   General: Negative for anorexia, weight loss, fever, chills, fatigue, weakness. ENT: Negative for hoarseness, difficulty swallowing , nasal congestion. CV: Negative for chest pain, angina, palpitations, dyspnea on exertion, peripheral edema.  Respiratory: Negative for dyspnea at rest, dyspnea on exertion, cough, sputum, wheezing.  GI: See history of present illness. GU:  Negative for dysuria, hematuria, urinary incontinence, urinary frequency, nocturnal urination.  Endo: Negative for unusual weight change.     Physical Exam   LMP 12/06/2013    General: Well-nourished, well-developed in no acute distress.  Eyes: No icterus. Mouth: Oropharyngeal mucosa moist and pink , no lesions erythema or exudate. Lungs: Clear to auscultation bilaterally.  Heart: Regular rate and rhythm, no murmurs rubs or gallops.  Abdomen: Bowel sounds are normal, nontender, nondistended, no hepatosplenomegaly or masses,  no abdominal bruits or hernia , no rebound or guarding.  Rectal: ***  Extremities: No lower extremity edema. No clubbing or deformities. Neuro: Alert and oriented x 4   Skin: Warm and dry, no jaundice.   Psych: Alert and cooperative, normal mood and affect.  Labs   *** Imaging Studies   CT ABDOMEN PELVIS W CONTRAST  Result Date: 06/20/2023 CLINICAL DATA:  Abdominal pain for a month. Previous UTI. Some blood in stool. Loss of appetite and weight loss. History of IBS EXAM: CT ABDOMEN AND PELVIS WITH CONTRAST TECHNIQUE: Multidetector CT imaging of the abdomen and pelvis was performed using the standard protocol following bolus administration of intravenous contrast. RADIATION DOSE REDUCTION: This exam was performed according to the departmental dose-optimization program which includes automated exposure control, adjustment of the mA and/or kV according to patient size and/or use of iterative reconstruction technique. CONTRAST:  OMNIPAQUE  IOHEXOL 300 MG/ML  SOLN COMPARISON:  CT abdomen pelvis November 2019 FINDINGS: Lower chest: There is some breathing motion at the lung bases.  No pleural effusion. Hepatobiliary: No focal liver abnormality is seen. No gallstones, gallbladder wall thickening, or biliary dilatation. Pancreas: Unremarkable. No pancreatic ductal dilatation or surrounding inflammatory changes. Spleen: Normal in size without focal abnormality. Adrenals/Urinary Tract: Adrenal glands are preserved. No enhancing renal mass or collecting system dilatation. Preserved contours of the urinary bladder. Stomach/Bowel: Large amount of diffuse colonic stool. Redundant course of the colon with cecum in the left midabdomen as seen previously. Small bowel is nondilated. Stomach is nondilated. Vascular/Lymphatic: Normal caliber aorta and IVC with mild vascular calcifications. There is some edema along the retroperitoneum. This has not seen previously. Reproductive: Uterus and bilateral adnexa are unremarkable. Other: No free intra-air. No free fluid. There is some linear thickening along the subcutaneous fat along the anterior aspect of the left hemipelvis. Please correlate with any previous intervention or other process. Musculoskeletal: Since the prior CT of the abdomen pelvis there has been new anterior hardware fusion at L4-5. Multilevel degenerative changes. Please correlate with the time course of surgery from December 2023. IMPRESSION: Large amount of diffuse colonic stool. No bowel obstruction. Of note once again the cecum is in the left midabdomen, unchanged from previous. Retroperitoneal stranding and mild edema identified of uncertain etiology and significance. Please correlate with clinical presentation. There is placement of anterior fusion at L4-5 of the spine since December 2023. overall would recommend short follow up evaluation and correlate with specific clinical presentation Electronically Signed   By: Karen Kays M.D.   On:  06/20/2023 12:41    Assessment       PLAN   ***   Leanna Battles. Melvyn Neth, MHS, PA-C [facility]

## 2023-07-17 NOTE — Telephone Encounter (Signed)
Pt was made aware and verbalized understanding.  

## 2023-07-18 DIAGNOSIS — G8929 Other chronic pain: Secondary | ICD-10-CM | POA: Diagnosis not present

## 2023-07-18 DIAGNOSIS — I6930 Unspecified sequelae of cerebral infarction: Secondary | ICD-10-CM | POA: Diagnosis not present

## 2023-07-18 DIAGNOSIS — F321 Major depressive disorder, single episode, moderate: Secondary | ICD-10-CM | POA: Diagnosis not present

## 2023-07-18 DIAGNOSIS — Z6822 Body mass index (BMI) 22.0-22.9, adult: Secondary | ICD-10-CM | POA: Diagnosis not present

## 2023-07-18 DIAGNOSIS — I10 Essential (primary) hypertension: Secondary | ICD-10-CM | POA: Diagnosis not present

## 2023-07-18 DIAGNOSIS — F419 Anxiety disorder, unspecified: Secondary | ICD-10-CM | POA: Diagnosis not present

## 2023-07-31 DIAGNOSIS — M5416 Radiculopathy, lumbar region: Secondary | ICD-10-CM | POA: Diagnosis not present

## 2023-08-02 ENCOUNTER — Other Ambulatory Visit (HOSPITAL_COMMUNITY): Payer: Self-pay | Admitting: Surgery

## 2023-08-02 DIAGNOSIS — M5416 Radiculopathy, lumbar region: Secondary | ICD-10-CM

## 2023-08-10 ENCOUNTER — Encounter: Payer: Self-pay | Admitting: Gastroenterology

## 2023-08-10 ENCOUNTER — Ambulatory Visit: Payer: Medicare HMO | Admitting: Obstetrics & Gynecology

## 2023-08-14 ENCOUNTER — Ambulatory Visit (HOSPITAL_COMMUNITY)
Admission: RE | Admit: 2023-08-14 | Discharge: 2023-08-14 | Disposition: A | Discharge status: Home / Self Care | Payer: Medicare HMO | Source: Ambulatory Visit | Attending: Surgery | Admitting: Surgery

## 2023-08-14 DIAGNOSIS — M5416 Radiculopathy, lumbar region: Secondary | ICD-10-CM | POA: Insufficient documentation

## 2023-08-14 DIAGNOSIS — M5137 Other intervertebral disc degeneration, lumbosacral region with discogenic back pain only: Secondary | ICD-10-CM | POA: Diagnosis not present

## 2023-08-14 DIAGNOSIS — Z981 Arthrodesis status: Secondary | ICD-10-CM | POA: Diagnosis not present

## 2023-08-14 DIAGNOSIS — M48061 Spinal stenosis, lumbar region without neurogenic claudication: Secondary | ICD-10-CM | POA: Diagnosis not present

## 2023-08-14 DIAGNOSIS — M4187 Other forms of scoliosis, lumbosacral region: Secondary | ICD-10-CM | POA: Diagnosis not present

## 2023-08-14 MED ORDER — GADOBUTROL 1 MMOL/ML IV SOLN
7.0000 mL | Freq: Once | INTRAVENOUS | Status: AC | PRN
  Administered 2023-08-14: 7 mL via INTRAVENOUS

## 2023-09-01 ENCOUNTER — Encounter: Payer: Self-pay | Admitting: Gastroenterology

## 2023-09-01 ENCOUNTER — Ambulatory Visit: Payer: Medicare HMO | Admitting: Gastroenterology

## 2023-09-01 NOTE — Progress Notes (Deleted)
GI Office Note    Referring Provider: Elfredia Nevins, MD Primary Care Physician:  Elfredia Nevins, MD  Primary Gastroenterologist: Hennie Duos. Marletta Lor, DO   Chief Complaint   No chief complaint on file.   History of Present Illness   [name] is a 62 y.o. female presenting today for follow up. Last seen in 06/2023. CT in 05/2023 large amt of diffuse colonic stool, cecum in left midabdomen unchanged. There was retroperitoneal stranding and mild edema identified of uncertain etiology and significant, also noting placement of anterior fusion at L4-5 of spine since 08/2022, overall would recommend short follow up evaluation and correlate with specific clinical presentation.    She completed EGD and colonoscopy June 2024. She was noted to have gastritis (focal mild chronic inactive gastritis but no H. pylori) nonbleeding internal hemorrhoids. Plans to repeat colonoscopy in 5 years because of personal history of adenomatous colon polyps.     Started PPI at last ov. Encouraged amitiza BID. Plans for repeat CT next month to follow up on prior CT findings.     Medications   Current Outpatient Medications  Medication Sig Dispense Refill   ALPRAZolam (XANAX) 0.5 MG tablet Take 0.5 mg by mouth 3 (three) times daily as needed for anxiety.     amitriptyline (ELAVIL) 50 MG tablet Take 50 mg by mouth at bedtime.     bethanechol (URECHOLINE) 5 MG tablet Take 5 mg by mouth 3 (three) times daily.     calcium carbonate (OSCAL) 1500 (600 Ca) MG TABS tablet Take 600 mg by mouth in the morning.     cholecalciferol (GNP VITAMIN D3) 10 MCG (400 UNIT) TABS tablet Take 400 Units by mouth in the morning.     cyclobenzaprine (FLEXERIL) 10 MG tablet Take 10 mg by mouth 3 (three) times daily as needed for muscle spasms.     escitalopram (LEXAPRO) 20 MG tablet Take 20 mg by mouth daily as needed.     HYDROcodone-acetaminophen (NORCO) 10-325 MG tablet Take 1 tablet by mouth every 6 (six) hours as  needed.     iron polysaccharides (NIFEREX) 150 MG capsule Take 150 mg by mouth daily as needed (low iron).     lubiprostone (AMITIZA) 8 MCG capsule Take 1 capsule (8 mcg total) by mouth 2 (two) times daily with a meal. 60 capsule 11   pantoprazole (PROTONIX) 20 MG tablet Take 1 tablet (20 mg total) by mouth daily. 30 tablet 0   rosuvastatin (CRESTOR) 10 MG tablet Take 10 mg by mouth daily.     venlafaxine XR (EFFEXOR-XR) 150 MG 24 hr capsule Take 150 mg by mouth daily.     No current facility-administered medications for this visit.    Allergies   Allergies as of 09/01/2023   (No Known Allergies)     Past Medical History   Past Medical History:  Diagnosis Date   Anemia    Arthritis    Breast cancer (HCC)    right breast cancer 5 yrs ago   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   APPENDIX IN LUQ   Depression    Dysrhythmia    hx palpitations-too much caffiene   Headache    patient denies this dx   Scoliosis     Past Surgical History   Past Surgical History:  Procedure Laterality Date   ABDOMINAL EXPOSURE N/A 08/25/2022   Procedure: ABDOMINAL EXPOSURE;  Surgeon: Cephus Shelling, MD;  Location: [facility];  Service: Vascular;  Laterality: N/A;  ANKLE SURGERY     left ankle cyst   ANTERIOR LUMBAR FUSION N/A 08/25/2022   Procedure: ANTERIOR LUMBAR FOUR TO FIVE INTERBODY FUSION, ILIAC CRST BONE GRAFT HARVEST;  Surgeon: Venita Lick, MD;  Location: [facility] OR;  Service: Orthopedics;  Laterality: N/A;  4 HRS DR. CLARK TO DO APPROACH 3 C-BED   BACK SURGERY  2000   lumbar disc surgery   BIOPSY  01/07/2020   Procedure: BIOPSY;  Surgeon: West Bali, MD;  Location: [facility];  Service: Endoscopy;;   BIOPSY  02/07/2023   Procedure: BIOPSY;  Surgeon: Lanelle Bal, DO;  Location: [facility];  Service: Endoscopy;;   BREAST EXCISIONAL BIOPSY Left    benign   BREAST SURGERY Left    left partail mastectomy-cyst   CERVICAL DISC SURGERY  1999   anterior and a  posterier cerv fusionx2   CESAREAN SECTION     x2   COLONOSCOPY     COLONOSCOPY WITH PROPOFOL N/A 06/26/2018   Procedure: COLONOSCOPY WITH PROPOFOL;  Surgeon: West Bali, MD;  Location: [facility];  Service: Endoscopy;  Laterality: N/A;  11:00am   COLONOSCOPY WITH PROPOFOL N/A 02/07/2023   Procedure: COLONOSCOPY WITH PROPOFOL;  Surgeon: Lanelle Bal, DO;  Location: [facility];  Service: Endoscopy;  Laterality: N/A;  115pm, asa 2   DILATION AND CURETTAGE OF UTERUS     DILITATION & CURRETTAGE/HYSTROSCOPY WITH THERMACHOICE ABLATION  07/10/2012   Procedure: DILATATION & CURETTAGE/HYSTEROSCOPY WITH THERMACHOICE ABLATION;  Surgeon: [name], MD;  Location: [facility];  Service: Gynecology;  Laterality: N/A;  D5 13ml in, 13 ml out, temp 87degree celcius, total therapy time 16sec   ERCP     ESOPHAGOGASTRODUODENOSCOPY (EGD) WITH PROPOFOL N/A 01/07/2020   Procedure: ESOPHAGOGASTRODUODENOSCOPY (EGD) WITH PROPOFOL;  Surgeon: West Bali, MD;  Location: [facility];  Service: Endoscopy;  Laterality: N/A;  2:00pm   ESOPHAGOGASTRODUODENOSCOPY (EGD) WITH PROPOFOL N/A 02/07/2023   Procedure: ESOPHAGOGASTRODUODENOSCOPY (EGD) WITH PROPOFOL;  Surgeon: Lanelle Bal, DO;  Location: [facility];  Service: Endoscopy;  Laterality: N/A;   EYE SURGERY     HARVEST BONE GRAFT N/A 08/25/2022   Procedure: HARVEST ILIAC BONE GRAFT;  Surgeon: Venita Lick, MD;  Location: [facility] OR;  Service: Orthopedics;  Laterality: N/A;   POLYPECTOMY  06/26/2018   Procedure: POLYPECTOMY;  Surgeon: West Bali, MD;  Location: [facility];  Service: Endoscopy;;  colon   TUBAL LIGATION     with last c-section    Past Family History   Family History  Problem Relation Age of Onset   Hypertension Mother    Colon cancer Neg Hx    Gastric cancer Neg Hx    Esophageal cancer Neg Hx     Past Social History   Social History   Socioeconomic History   Marital status: Married    Spouse name: Not on  file   Number of children: 2   Years of education: Not on file   Highest education level: Not on file  Occupational History   Not on file  Tobacco Use   Smoking status: Never   Smokeless tobacco: Never  Vaping Use   Vaping status: Never Used  Substance and Sexual Activity   Alcohol use: No   Drug use: No   Sexual activity: Yes    Birth control/protection: Surgical, Post-menopausal    Comment: tubal ligation & ablation  Other Topics Concern   Not on file  Social History  Narrative   Not on file   Social Drivers of Health   Financial Resource Strain: Medium Risk (08/08/2022)   Overall Financial Resource Strain (CARDIA)    Difficulty of Paying Living Expenses: Somewhat hard  Food Insecurity: Food Insecurity Present (08/08/2022)   Hunger Vital Sign    Worried About Running Out of Food in the Last Year: Sometimes true    Ran Out of Food in the Last Year: Often true  Transportation Needs: No Transportation Needs (08/08/2022)   PRAPARE - Administrator, Civil Service (Medical): No    Lack of Transportation (Non-Medical): No  Physical Activity: Inactive (08/08/2022)   Exercise Vital Sign    Days of Exercise per Week: 0 days    Minutes of Exercise per Session: 0 min  Stress: No Stress Concern Present (08/08/2022)   [employer] of Occupational Health - Occupational Stress Questionnaire    Feeling of Stress : Not at all  Social Connections: Moderately Integrated (08/08/2022)   Social Connection and Isolation Panel [NHANES]    Frequency of Communication with Friends and Family: More than three times a week    Frequency of Social Gatherings with Friends and Family: Three times a week    Attends Religious Services: 1 to 4 times per year    Active Member of Clubs or Organizations: No    Attends Banker Meetings: Never    Marital Status: Married  Catering manager Violence: Not At Risk (08/08/2022)   Humiliation, Afraid, Rape, and Kick questionnaire    Fear of  Current or Ex-Partner: No    Emotionally Abused: No    Physically Abused: No    Sexually Abused: No    Review of Systems   General: Negative for anorexia, weight loss, fever, chills, fatigue, weakness. ENT: Negative for hoarseness, difficulty swallowing , nasal congestion. CV: Negative for chest pain, angina, palpitations, dyspnea on exertion, peripheral edema.  Respiratory: Negative for dyspnea at rest, dyspnea on exertion, cough, sputum, wheezing.  GI: See history of present illness. GU:  Negative for dysuria, hematuria, urinary incontinence, urinary frequency, nocturnal urination.  Endo: Negative for unusual weight change.     Physical Exam   LMP 12/06/2013    General: Well-nourished, well-developed in no acute distress.  Eyes: No icterus. Mouth: Oropharyngeal mucosa moist and pink , no lesions erythema or exudate. Lungs: Clear to auscultation bilaterally.  Heart: Regular rate and rhythm, no murmurs rubs or gallops.  Abdomen: Bowel sounds are normal, nontender, nondistended, no hepatosplenomegaly or masses,  no abdominal bruits or hernia , no rebound or guarding.  Rectal: ***  Extremities: No lower extremity edema. No clubbing or deformities. Neuro: Alert and oriented x 4   Skin: Warm and dry, no jaundice.   Psych: Alert and cooperative, normal mood and affect.  Labs   Lab Results  Component Value Date   NA 135 06/20/2023   CL 100 06/20/2023   K 3.4 (L) 06/20/2023   CO2 26 06/20/2023   BUN 9 06/20/2023   CREATININE 0.60 06/20/2023   GFRNONAA >60 06/20/2023   CALCIUM 9.0 06/20/2023   ALBUMIN 4.0 06/20/2023   GLUCOSE 100 (H) 06/20/2023   Lab Results  Component Value Date   ALT 17 06/20/2023   AST 22 06/20/2023   ALKPHOS 71 06/20/2023   BILITOT 0.4 06/20/2023   Lab Results  Component Value Date   LIPASE 23 06/20/2023   Lab Results  Component Value Date   WBC 3.8 (L) 06/20/2023   HGB 11.8 (  L) 06/20/2023   HCT 36.3 06/20/2023   MCV 96.5 06/20/2023   PLT  237 06/20/2023    No results found for: "VITAMINB12" No results found for: "FOLATE"  Imaging Studies   MR Lumbar Spine W Wo Contrast Result Date: 08/26/2023 CLINICAL DATA:  Low back pain.  History of back surgery. EXAM: MRI LUMBAR SPINE WITHOUT AND WITH CONTRAST TECHNIQUE: Multiplanar and multiecho pulse sequences of the lumbar spine were obtained without and with intravenous contrast. CONTRAST:  7mL GADAVIST GADOBUTROL 1 MMOL/ML IV SOLN COMPARISON:  Abdominopelvic CT 06/20/2023. Lumbar spine radiographs 08/25/2022 and lumbar MRI 10/06/2021. FINDINGS: Segmentation: Conventional anatomy assumed, with the last open disc space designated L5-S1.Concordant with prior imaging. Alignment: Stable mild convex left scoliosis. No focal angulation or listhesis. Vertebrae: No worrisome osseous lesion, acute fracture or pars defect. The visualized sacroiliac joints appear unremarkable. Conus medullaris: Extends to the L1-2 level and appears normal. No abnormal intradural enhancement. Paraspinal and other soft tissues: No significant paraspinal findings. A large amount of stool is again noted throughout the visualized colon. Disc levels: Sagittal images demonstrate no significant disc space findings within the visualized lower thoracic spine. L1-2: Normal interspace. L2-3: Stable postsurgical changes from previous left laminotomy. There is loss of disc height with annular disc bulging and endplate osteophytes asymmetric to the right. Mild facet hypertrophy and ligamentum thickening on the right. Stable mild chronic right lateral recess and right foraminal narrowing without definite nerve root encroachment. L3-4: Similar chronic loss of disc height with annular disc bulging eccentric to the right, facet and ligamentous hypertrophy. Unchanged mild narrowing of the lateral recesses and right foramen. L4-5: Interval anterior lumbar interbody fusion. Remote left laminectomy. Interval improved patency of the left lateral  recess and left foramen. The spinal canal and right foramen are widely patent. L5-S1: Chronic loss of disc height with annular disc bulging and endplate osteophytes. A right paracentral disc extrusion has mildly enlarged from the previous MRI, with mildly increased mass effect on the right S1 nerve root. No significant change in chronic mild left greater than right foraminal narrowing. IMPRESSION: 1. Interval anterior lumbar interbody fusion at L4-5 with improved patency of the left lateral recess and foramen. 2. Mild enlargement of a chronic right paracentral disc extrusion at L5-S1 with mildly increased mass effect on the right S1 nerve root. 3. Stable mild chronic right lateral recess and right foraminal narrowing at L2-3 and L3-4. 4. No acute findings. Electronically Signed   By: Carey Bullocks M.D.   On: 08/26/2023 13:50    Assessment       PLAN   ***   Leanna Battles. Melvyn Neth, MHS, PA-C [facility]

## 2023-09-04 ENCOUNTER — Ambulatory Visit: Payer: Medicare HMO | Admitting: Gastroenterology

## 2023-09-05 DIAGNOSIS — K219 Gastro-esophageal reflux disease without esophagitis: Secondary | ICD-10-CM | POA: Diagnosis not present

## 2023-09-05 DIAGNOSIS — I10 Essential (primary) hypertension: Secondary | ICD-10-CM | POA: Diagnosis not present

## 2023-09-07 ENCOUNTER — Ambulatory Visit: Payer: Medicare HMO | Admitting: Obstetrics & Gynecology

## 2023-09-20 DIAGNOSIS — M5416 Radiculopathy, lumbar region: Secondary | ICD-10-CM | POA: Diagnosis not present

## 2023-10-03 DIAGNOSIS — M5416 Radiculopathy, lumbar region: Secondary | ICD-10-CM | POA: Diagnosis not present

## 2023-10-20 DIAGNOSIS — F320 Major depressive disorder, single episode, mild: Secondary | ICD-10-CM | POA: Diagnosis not present

## 2023-10-20 DIAGNOSIS — I10 Essential (primary) hypertension: Secondary | ICD-10-CM | POA: Diagnosis not present

## 2023-10-20 DIAGNOSIS — Z6822 Body mass index (BMI) 22.0-22.9, adult: Secondary | ICD-10-CM | POA: Diagnosis not present

## 2023-10-20 DIAGNOSIS — F419 Anxiety disorder, unspecified: Secondary | ICD-10-CM | POA: Diagnosis not present

## 2023-10-20 DIAGNOSIS — K219 Gastro-esophageal reflux disease without esophagitis: Secondary | ICD-10-CM | POA: Diagnosis not present

## 2023-10-20 DIAGNOSIS — I6930 Unspecified sequelae of cerebral infarction: Secondary | ICD-10-CM | POA: Diagnosis not present

## 2023-10-20 DIAGNOSIS — M549 Dorsalgia, unspecified: Secondary | ICD-10-CM | POA: Diagnosis not present

## 2023-10-20 DIAGNOSIS — R413 Other amnesia: Secondary | ICD-10-CM | POA: Diagnosis not present

## 2023-10-30 DIAGNOSIS — M5416 Radiculopathy, lumbar region: Secondary | ICD-10-CM | POA: Diagnosis not present

## 2023-11-02 ENCOUNTER — Ambulatory Visit: Payer: Self-pay | Admitting: Obstetrics & Gynecology

## 2023-11-03 DIAGNOSIS — I10 Essential (primary) hypertension: Secondary | ICD-10-CM | POA: Diagnosis not present

## 2023-11-03 DIAGNOSIS — I6930 Unspecified sequelae of cerebral infarction: Secondary | ICD-10-CM | POA: Diagnosis not present

## 2023-11-03 DIAGNOSIS — G8929 Other chronic pain: Secondary | ICD-10-CM | POA: Diagnosis not present

## 2023-11-10 DIAGNOSIS — D518 Other vitamin B12 deficiency anemias: Secondary | ICD-10-CM | POA: Diagnosis not present

## 2023-11-10 DIAGNOSIS — I6930 Unspecified sequelae of cerebral infarction: Secondary | ICD-10-CM | POA: Diagnosis not present

## 2023-11-10 DIAGNOSIS — Z6822 Body mass index (BMI) 22.0-22.9, adult: Secondary | ICD-10-CM | POA: Diagnosis not present

## 2023-11-10 DIAGNOSIS — I10 Essential (primary) hypertension: Secondary | ICD-10-CM | POA: Diagnosis not present

## 2023-11-10 DIAGNOSIS — K219 Gastro-esophageal reflux disease without esophagitis: Secondary | ICD-10-CM | POA: Diagnosis not present

## 2023-11-10 DIAGNOSIS — F419 Anxiety disorder, unspecified: Secondary | ICD-10-CM | POA: Diagnosis not present

## 2023-11-10 DIAGNOSIS — E063 Autoimmune thyroiditis: Secondary | ICD-10-CM | POA: Diagnosis not present

## 2023-11-10 DIAGNOSIS — N390 Urinary tract infection, site not specified: Secondary | ICD-10-CM | POA: Diagnosis not present

## 2023-11-10 DIAGNOSIS — F321 Major depressive disorder, single episode, moderate: Secondary | ICD-10-CM | POA: Diagnosis not present

## 2023-11-10 DIAGNOSIS — E559 Vitamin D deficiency, unspecified: Secondary | ICD-10-CM | POA: Diagnosis not present

## 2023-11-10 DIAGNOSIS — Z0001 Encounter for general adult medical examination with abnormal findings: Secondary | ICD-10-CM | POA: Diagnosis not present

## 2023-11-10 DIAGNOSIS — Z1331 Encounter for screening for depression: Secondary | ICD-10-CM | POA: Diagnosis not present

## 2023-11-10 DIAGNOSIS — G9332 Myalgic encephalomyelitis/chronic fatigue syndrome: Secondary | ICD-10-CM | POA: Diagnosis not present

## 2023-11-13 ENCOUNTER — Other Ambulatory Visit: Payer: Self-pay | Admitting: Internal Medicine

## 2023-11-13 DIAGNOSIS — Z1231 Encounter for screening mammogram for malignant neoplasm of breast: Secondary | ICD-10-CM

## 2023-11-17 ENCOUNTER — Ambulatory Visit
Admission: RE | Admit: 2023-11-17 | Discharge: 2023-11-17 | Disposition: A | Discharge status: Home / Self Care | Payer: Self-pay | Source: Ambulatory Visit | Attending: Internal Medicine | Admitting: Internal Medicine

## 2023-11-17 DIAGNOSIS — Z1231 Encounter for screening mammogram for malignant neoplasm of breast: Secondary | ICD-10-CM | POA: Diagnosis not present

## 2023-11-28 DIAGNOSIS — T50905A Adverse effect of unspecified drugs, medicaments and biological substances, initial encounter: Secondary | ICD-10-CM | POA: Diagnosis not present

## 2023-11-28 DIAGNOSIS — Z6822 Body mass index (BMI) 22.0-22.9, adult: Secondary | ICD-10-CM | POA: Diagnosis not present

## 2023-11-28 DIAGNOSIS — E538 Deficiency of other specified B group vitamins: Secondary | ICD-10-CM | POA: Diagnosis not present

## 2023-11-28 DIAGNOSIS — R42 Dizziness and giddiness: Secondary | ICD-10-CM | POA: Diagnosis not present

## 2023-11-29 ENCOUNTER — Encounter: Payer: Self-pay | Admitting: Physician Assistant

## 2023-12-02 ENCOUNTER — Ambulatory Visit
Admission: EM | Admit: 2023-12-02 | Discharge: 2023-12-02 | Disposition: A | Discharge status: Home / Self Care | Attending: Nurse Practitioner | Admitting: Nurse Practitioner

## 2023-12-02 ENCOUNTER — Encounter: Payer: Self-pay | Admitting: Emergency Medicine

## 2023-12-02 DIAGNOSIS — J069 Acute upper respiratory infection, unspecified: Secondary | ICD-10-CM | POA: Diagnosis not present

## 2023-12-02 LAB — POC COVID19/FLU A&B COMBO
Covid Antigen, POC: NEGATIVE
Influenza A Antigen, POC: NEGATIVE
Influenza B Antigen, POC: NEGATIVE

## 2023-12-02 MED ORDER — CETIRIZINE HCL 10 MG PO TABS: 10.0000 mg | ORAL_TABLET | Freq: Every day | ORAL | 0 refills | Status: DC

## 2023-12-02 MED ORDER — PSEUDOEPH-BROMPHEN-DM 30-2-10 MG/5ML PO SYRP: 5.0000 mL | ORAL_SOLUTION | Freq: Four times a day (QID) | ORAL | 0 refills | Status: DC | PRN

## 2023-12-02 MED ORDER — FLUTICASONE PROPIONATE 50 MCG/ACT NA SUSP: 2.0000 | Freq: Every day | NASAL | 0 refills | Status: DC

## 2023-12-02 NOTE — ED Provider Notes (Signed)
 [facility]    CSN: [id number] Arrival date & time: 12/02/23  1116      History   Chief Complaint No chief complaint on file.   HPI [name] is a 63 y.o. female.   The history is provided by the patient.   Patient presents with a 1 day history of cough, postnasal drainage, and nasal congestion.  She denies fever, chills, headache, ear pain, sore throat, wheezing, difficulty breathing, chest pain, abdominal pain, nausea, vomiting, diarrhea, or rash.  Patient states that she took Benadryl and aspirin for her symptoms.  She denies any obvious known sick contacts.  Past Medical History:  Diagnosis Date   Anemia    Arthritis    Breast cancer (HCC)    right breast cancer 5 yrs ago   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   APPENDIX IN LUQ   Depression    Dysrhythmia    hx palpitations-too much caffiene   Headache    patient denies this dx   Scoliosis     Patient Active Problem List   Diagnosis Date Noted   IBS (irritable colon syndrome) 07/03/2023   Loss of weight 07/03/2023   Abdominal mass, left lower quadrant 05/10/2023   GERD (gastroesophageal reflux disease) 01/09/2023   Rectal bleeding 01/09/2023   H/O adenomatous polyp of colon 01/09/2023   S/P lumbar fusion 08/25/2022   Low back pain 08/02/2022   Stenosis of lateral recess of lumbar spine 08/02/2022   Pill dysphagia 04/19/2022   Women's annual routine gynecological examination 06/21/2021   Lumbar radiculopathy 06/11/2021   Lumbar pain 04/19/2021   Mass of upper outer quadrant of left breast 04/13/2021   Encounter for screening fecal occult blood testing 06/18/2020   Encounter for gynecological examination with Papanicolaou smear of cervix 06/18/2020   Mass of wrist 03/23/2020   Encounter for orthopedic follow-up care 03/17/2020   Left wrist pain 02/13/2020   NSAID induced gastritis    Anemia 12/20/2019   Dyspepsia 10/23/2019   Simple bone cyst 11/23/2018   Constipation 04/16/2018    Encounter for screening colonoscopy 04/16/2018   Displacement of cervical intervertebral disc without myelopathy 02/20/2017   Cancer of breast, intraductal, right 12/21/2016   Pap smear, as part of routine gynecological examination 12/21/2016   Anxiety 12/09/2015   Muscular aches 01/05/2015   Reactive depression 01/05/2015   Perimenopausal and postmenopausal vasomotor symptoms 10/09/2014   Hot flashes due to tamoxifen 07/28/2014   Memory loss 07/28/2014   Vision changes 07/28/2014   Encounter for routine gynecological examination 11/21/2013   Cancer of upper-inner quadrant of female breast (HCC) 10/11/2013   Breast cancer (HCC) 09/04/2013   Cervical spondylosis without myelopathy 04/03/2013   Pain in elbow 04/03/2013   Menorrhagia with irregular cycle 07/10/2012    Past Surgical History:  Procedure Laterality Date   ABDOMINAL EXPOSURE N/A 08/25/2022   Procedure: ABDOMINAL EXPOSURE;  Surgeon: Cephus Shelling, MD;  Location: [facility] OR;  Service: Vascular;  Laterality: N/A;   ANKLE SURGERY     left ankle cyst   ANTERIOR LUMBAR FUSION N/A 08/25/2022   Procedure: ANTERIOR LUMBAR FOUR TO FIVE INTERBODY FUSION, ILIAC CRST BONE GRAFT HARVEST;  Surgeon: Venita Lick, MD;  Location: [facility] OR;  Service: Orthopedics;  Laterality: N/A;  4 HRS DR. CLARK TO DO APPROACH 3 C-BED   BACK SURGERY  2000   lumbar disc surgery   BIOPSY  01/07/2020   Procedure: BIOPSY;  Surgeon: West Bali, MD;  Location: [facility];  Service: Endoscopy;;   BIOPSY  02/07/2023   Procedure: BIOPSY;  Surgeon: Lanelle Bal, DO;  Location: [facility];  Service: Endoscopy;;   BREAST EXCISIONAL BIOPSY Left    benign   BREAST SURGERY Left    left partail mastectomy-cyst   CERVICAL DISC SURGERY  1999   anterior and a posterier cerv fusionx2   CESAREAN SECTION     x2   COLONOSCOPY     COLONOSCOPY WITH PROPOFOL N/A 06/26/2018   Procedure: COLONOSCOPY WITH PROPOFOL;  Surgeon: West Bali, MD;  Location: [facility];  Service: Endoscopy;  Laterality: N/A;  11:00am   COLONOSCOPY WITH PROPOFOL N/A 02/07/2023   Procedure: COLONOSCOPY WITH PROPOFOL;  Surgeon: Lanelle Bal, DO;  Location: [facility];  Service: Endoscopy;  Laterality: N/A;  115pm, asa 2   DILATION AND CURETTAGE OF UTERUS     DILITATION & CURRETTAGE/HYSTROSCOPY WITH THERMACHOICE ABLATION  07/10/2012   Procedure: DILATATION & CURETTAGE/HYSTEROSCOPY WITH THERMACHOICE ABLATION;  Surgeon: [name], MD;  Location: [facility];  Service: Gynecology;  Laterality: N/A;  D5 13ml in, 13 ml out, temp 87degree celcius, total therapy time 16sec   ERCP     ESOPHAGOGASTRODUODENOSCOPY (EGD) WITH PROPOFOL N/A 01/07/2020   Procedure: ESOPHAGOGASTRODUODENOSCOPY (EGD) WITH PROPOFOL;  Surgeon: West Bali, MD;  Location: [facility];  Service: Endoscopy;  Laterality: N/A;  2:00pm   ESOPHAGOGASTRODUODENOSCOPY (EGD) WITH PROPOFOL N/A 02/07/2023   Procedure: ESOPHAGOGASTRODUODENOSCOPY (EGD) WITH PROPOFOL;  Surgeon: Lanelle Bal, DO;  Location: [facility];  Service: Endoscopy;  Laterality: N/A;   EYE SURGERY     HARVEST BONE GRAFT N/A 08/25/2022   Procedure: HARVEST ILIAC BONE GRAFT;  Surgeon: Venita Lick, MD;  Location: [facility];  Service: Orthopedics;  Laterality: N/A;   POLYPECTOMY  06/26/2018   Procedure: POLYPECTOMY;  Surgeon: West Bali, MD;  Location: [facility];  Service: Endoscopy;;  colon   TUBAL LIGATION     with last c-section    OB History     Gravida  4   Para  2   Term      Preterm      AB      Living  2      SAB      IAB      Ectopic      Multiple      Live Births  2            Home Medications    Prior to Admission medications   Medication Sig Start Date End Date Taking? Authorizing Provider  brompheniramine-pseudoephedrine-DM 30-2-10 MG/5ML syrup Take 5 mLs by mouth 4 (four) times daily as needed. 12/02/23  Yes Leath-Warren, Sadie Haber, NP  cetirizine (ZYRTEC) 10 MG tablet  Take 1 tablet (10 mg total) by mouth daily. 12/02/23  Yes Leath-Warren, Sadie Haber, NP  fluticasone (FLONASE) 50 MCG/ACT nasal spray Place 2 sprays into both nostrils daily. 12/02/23  Yes Leath-Warren, Sadie Haber, NP  ALPRAZolam Prudy Feeler) 0.5 MG tablet Take 0.5 mg by mouth 3 (three) times daily as needed for anxiety.    [provider]  amitriptyline (ELAVIL) 50 MG tablet Take 50 mg by mouth at bedtime.    [provider]  bethanechol (URECHOLINE) 5 MG tablet Take 5 mg by mouth 3 (three) times daily.    [provider]  calcium carbonate (OSCAL) 1500 (600 Ca) MG TABS tablet Take 600 mg by mouth in the morning.  [provider]  cholecalciferol (GNP VITAMIN D3) 10 MCG (400 UNIT) TABS tablet Take 400 Units by mouth in the morning.    [provider]  cyclobenzaprine (FLEXERIL) 10 MG tablet Take 10 mg by mouth 3 (three) times daily as needed for muscle spasms. 10/21/22   [provider]  escitalopram (LEXAPRO) 20 MG tablet Take 20 mg by mouth daily as needed.    [provider]  HYDROcodone-acetaminophen (NORCO) 10-325 MG tablet Take 1 tablet by mouth every 6 (six) hours as needed.    [provider]  iron polysaccharides (NIFEREX) 150 MG capsule Take 150 mg by mouth daily as needed (low iron).    [provider]  lubiprostone (AMITIZA) 8 MCG capsule Take 1 capsule (8 mcg total) by mouth 2 (two) times daily with a meal. 07/03/23   Tiffany Kocher, PA-C    Family History Family History  Problem Relation Age of Onset   Hypertension Mother    Colon cancer Neg Hx    Gastric cancer Neg Hx    Esophageal cancer Neg Hx     Social History Social History   Tobacco Use   Smoking status: Never   Smokeless tobacco: Never  Vaping Use   Vaping status: Never Used  Substance Use Topics   Alcohol use: No   Drug use: No     Allergies   Patient has no known allergies.   Review of Systems Review of Systems Per  HPI  Physical Exam Triage Vital Signs ED Triage Vitals  Encounter Vitals Group     BP 12/02/23 1152 112/77     Systolic BP Percentile --      Diastolic BP Percentile --      Pulse Rate 12/02/23 1152 100     Resp 12/02/23 1152 18     Temp 12/02/23 1152 98.4 F (36.9 C)     Temp Source 12/02/23 1152 Oral     SpO2 12/02/23 1152 98 %     Weight --      Height --      Head Circumference --      Peak Flow --      Pain Score 12/02/23 1153 0     Pain Loc --      Pain Education --      Exclude from Growth Chart --    No data found.  Updated Vital Signs BP 112/77 (BP Location: Left Arm)   Pulse 100   Temp 98.4 F (36.9 C) (Oral)   Resp 18   LMP 12/06/2013   SpO2 98%   Visual Acuity Right Eye Distance:   Left Eye Distance:   Bilateral Distance:    Right Eye Near:   Left Eye Near:    Bilateral Near:     Physical Exam Vitals and nursing note reviewed.  Constitutional:      General: She is not in acute distress.    Appearance: Normal appearance.  HENT:     Head: Normocephalic.     Right Ear: Tympanic membrane, ear canal and external ear normal.     Left Ear: Tympanic membrane, ear canal and external ear normal.     Nose: Congestion present.     Right Turbinates: Enlarged and swollen.     Left Turbinates: Enlarged.     Right Sinus: No maxillary sinus tenderness or frontal sinus tenderness.     Left Sinus: No maxillary sinus tenderness or frontal sinus tenderness.     Mouth/Throat:  Lips: Pink.     Mouth: Mucous membranes are moist.     Pharynx: Uvula midline. Postnasal drip present. No pharyngeal swelling, oropharyngeal exudate, posterior oropharyngeal erythema or uvula swelling.     Comments: Cobblestoning present to posterior oropharynx  Eyes:     Extraocular Movements: Extraocular movements intact.     Conjunctiva/sclera: Conjunctivae normal.     Pupils: Pupils are equal, round, and reactive to light.  Cardiovascular:     Rate and Rhythm: Normal rate and  regular rhythm.     Pulses: Normal pulses.     Heart sounds: Normal heart sounds.  Pulmonary:     Effort: Pulmonary effort is normal. No respiratory distress.     Breath sounds: Normal breath sounds. No stridor. No wheezing, rhonchi or rales.  Abdominal:     General: Bowel sounds are normal.     Palpations: Abdomen is soft.     Tenderness: There is no abdominal tenderness.  Musculoskeletal:     Cervical back: Normal range of motion.  Skin:    General: Skin is warm and dry.  Neurological:     General: No focal deficit present.     Mental Status: She is alert and oriented to person, place, and time.  Psychiatric:        Mood and Affect: Mood normal.        Behavior: Behavior normal.      UC Treatments / Results  Labs (all labs ordered are listed, but only abnormal results are displayed) Labs Reviewed  POC COVID19/FLU A&B COMBO    EKG   Radiology No results found.  Procedures Procedures (including critical care time)  Medications Ordered in UC Medications - No data to display  Initial Impression / Assessment and Plan / UC Course  I have reviewed the triage vital signs and the nursing notes.  Pertinent labs & imaging results that were available during my care of the patient were reviewed by me and considered in my medical decision making (see chart for details).  COVID/flu test is negative.  Suspect a viral URI with cough at this time.  Will provide symptomatic treatment with Bromfed-DM for the cough, fluticasone 50 mcg nasal spray for nasal congestion, and cetirizine 10 mg as an antihistamine.  Supportive care recommendations were provided and discussed with the patient to include fluids, rest, over-the-counter Tylenol, normal saline nasal spray, and sleeping elevated at bedtime.  Discussed indications with patient regarding follow-up.  Patient was in agreement with this plan of care and verbalized understanding.  All questions were answered.  Patient stable for  discharge.  Final Clinical Impressions(s) / UC Diagnoses   Final diagnoses:  Viral URI with cough     Discharge Instructions      The COVID/flu test was negative. Take medication as prescribed. Increase fluids and allow for plenty of rest. Recommend over-the-counter Tylenol as needed for pain, fever, or general discomfort. May use normal saline nasal spray throughout the day for nasal congestion and runny nose. For your cough, recommend use of a humidifier at nighttime during sleep and sleeping elevated while symptoms persist. If symptoms do not improve over the next several days, or appear to be worsening, you may follow-up in this clinic or with your primary care physician for further evaluation. Follow-up as needed.     ED Prescriptions     Medication Sig Dispense Auth. Provider   brompheniramine-pseudoephedrine-DM 30-2-10 MG/5ML syrup Take 5 mLs by mouth 4 (four) times daily as needed. 140 mL Leath-Warren, Lorene Dy  J, NP   fluticasone (FLONASE) 50 MCG/ACT nasal spray Place 2 sprays into both nostrils daily. 16 g Leath-Warren, Sadie Haber, NP   cetirizine (ZYRTEC) 10 MG tablet Take 1 tablet (10 mg total) by mouth daily. 30 tablet Leath-Warren, Sadie Haber, NP      PDMP not reviewed this encounter.   Abran Cantor, NP 12/02/23 [phone]

## 2023-12-02 NOTE — Discharge Instructions (Signed)
 The COVID/flu test was negative. Take medication as prescribed. Increase fluids and allow for plenty of rest. Recommend over-the-counter Tylenol as needed for pain, fever, or general discomfort. May use normal saline nasal spray throughout the day for nasal congestion and runny nose. For your cough, recommend use of a humidifier at nighttime during sleep and sleeping elevated while symptoms persist. If symptoms do not improve over the next several days, or appear to be worsening, you may follow-up in this clinic or with your primary care physician for further evaluation. Follow-up as needed.

## 2023-12-02 NOTE — ED Triage Notes (Signed)
 Cough and congestion since yesterday.  Tried over the counter cough medication without relief.

## 2024-01-08 ENCOUNTER — Emergency Department (HOSPITAL_COMMUNITY)

## 2024-01-08 ENCOUNTER — Ambulatory Visit: Admitting: Physician Assistant

## 2024-01-08 ENCOUNTER — Ambulatory Visit

## 2024-01-08 ENCOUNTER — Ambulatory Visit: Admission: EM | Admit: 2024-01-08 | Discharge: 2024-01-08 | Disposition: A | Discharge status: Home / Self Care

## 2024-01-08 ENCOUNTER — Emergency Department (HOSPITAL_COMMUNITY)
Admission: EM | Admit: 2024-01-08 | Discharge: 2024-01-08 | Disposition: A | Discharge status: Home / Self Care | Attending: Emergency Medicine | Admitting: Emergency Medicine

## 2024-01-08 ENCOUNTER — Encounter (HOSPITAL_COMMUNITY): Payer: Self-pay

## 2024-01-08 ENCOUNTER — Other Ambulatory Visit: Payer: Self-pay

## 2024-01-08 DIAGNOSIS — R202 Paresthesia of skin: Secondary | ICD-10-CM | POA: Diagnosis not present

## 2024-01-08 DIAGNOSIS — R42 Dizziness and giddiness: Secondary | ICD-10-CM

## 2024-01-08 DIAGNOSIS — R55 Syncope and collapse: Secondary | ICD-10-CM | POA: Diagnosis not present

## 2024-01-08 DIAGNOSIS — R531 Weakness: Secondary | ICD-10-CM | POA: Diagnosis not present

## 2024-01-08 DIAGNOSIS — D72819 Decreased white blood cell count, unspecified: Secondary | ICD-10-CM | POA: Insufficient documentation

## 2024-01-08 DIAGNOSIS — Z853 Personal history of malignant neoplasm of breast: Secondary | ICD-10-CM | POA: Diagnosis not present

## 2024-01-08 DIAGNOSIS — M79605 Pain in left leg: Secondary | ICD-10-CM | POA: Insufficient documentation

## 2024-01-08 LAB — URINALYSIS, ROUTINE W REFLEX MICROSCOPIC
Bilirubin Urine: NEGATIVE
Glucose, UA: NEGATIVE mg/dL
Hgb urine dipstick: NEGATIVE
Ketones, ur: NEGATIVE mg/dL
Leukocytes,Ua: NEGATIVE
Nitrite: NEGATIVE
Protein, ur: NEGATIVE mg/dL
Specific Gravity, Urine: 1.015 (ref 1.005–1.030)
pH: 7 (ref 5.0–8.0)

## 2024-01-08 LAB — CBC WITH DIFFERENTIAL/PLATELET
Abs Immature Granulocytes: 0.01 10*3/uL (ref 0.00–0.07)
Basophils Absolute: 0.1 10*3/uL (ref 0.0–0.1)
Basophils Relative: 1 %
Eosinophils Absolute: 0 10*3/uL (ref 0.0–0.5)
Eosinophils Relative: 0 %
HCT: 36.8 % (ref 36.0–46.0)
Hemoglobin: 12 g/dL (ref 12.0–15.0)
Immature Granulocytes: 0 %
Lymphocytes Relative: 31 %
Lymphs Abs: 1.2 10*3/uL (ref 0.7–4.0)
MCH: 32 pg (ref 26.0–34.0)
MCHC: 32.6 g/dL (ref 30.0–36.0)
MCV: 98.1 fL (ref 80.0–100.0)
Monocytes Absolute: 0.4 10*3/uL (ref 0.1–1.0)
Monocytes Relative: 10 %
Neutro Abs: 2.2 10*3/uL (ref 1.7–7.7)
Neutrophils Relative %: 58 %
Platelets: 242 10*3/uL (ref 150–400)
RBC: 3.75 MIL/uL — ABNORMAL LOW (ref 3.87–5.11)
RDW: 14 % (ref 11.5–15.5)
WBC: 3.8 10*3/uL — ABNORMAL LOW (ref 4.0–10.5)
nRBC: 0 % (ref 0.0–0.2)

## 2024-01-08 LAB — COMPREHENSIVE METABOLIC PANEL WITH GFR
ALT: 18 U/L (ref 0–44)
AST: 25 U/L (ref 15–41)
Albumin: 4.1 g/dL (ref 3.5–5.0)
Alkaline Phosphatase: 76 U/L (ref 38–126)
Anion gap: 11 (ref 5–15)
BUN: 10 mg/dL (ref 8–23)
CO2: 27 mmol/L (ref 22–32)
Calcium: 9.6 mg/dL (ref 8.9–10.3)
Chloride: 99 mmol/L (ref 98–111)
Creatinine, Ser: 0.61 mg/dL (ref 0.44–1.00)
GFR, Estimated: >60 mL/min (ref >60)
Glucose, Bld: 95 mg/dL (ref 70–99)
Potassium: 3.7 mmol/L (ref 3.5–5.1)
Sodium: 137 mmol/L (ref 135–145)
Total Bilirubin: 0.7 mg/dL (ref 0.0–1.2)
Total Protein: 7.6 g/dL (ref 6.5–8.1)

## 2024-01-08 LAB — D-DIMER, QUANTITATIVE: D-Dimer, Quant: >20 ug{FEU}/mL — ABNORMAL HIGH (ref 0.00–0.50)

## 2024-01-08 LAB — LIPASE, BLOOD: Lipase: 24 U/L (ref 11–51)

## 2024-01-08 MED ORDER — LACTATED RINGERS IV BOLUS
1000.0000 mL | Freq: Once | INTRAVENOUS | Status: AC
  Administered 2024-01-08: 1000 mL via INTRAVENOUS

## 2024-01-08 MED ORDER — IOHEXOL 350 MG/ML SOLN
75.0000 mL | Freq: Once | INTRAVENOUS | Status: AC | PRN
  Administered 2024-01-08: 75 mL via INTRAVENOUS

## 2024-01-08 NOTE — ED Notes (Signed)
 Pt reports concerns for possible Vertigo, but is unsure, and UC wasn't going to be able to evaluate the Pt correctly due to available resources.

## 2024-01-08 NOTE — ED Provider Notes (Signed)
 [facility]    CSN: [id number] Arrival date & time: 01/08/24  1000      History   Chief Complaint No chief complaint on file.   HPI [name] is a 63 y.o. female.   The history is provided by the patient.   Patient presents with a 1 week history of dizziness, change in her mental status, brain fog, and difficulty concentrating.  Patient also reports that she "feels like she is going to pass out."  Patient denies headache, ear pain, ear drainage, nausea, vomiting, blurred vision, or lightheadedness.  Patient states that she feels like this may be vertigo.  Patient's husband states that "she has not been acting like herself." Past Medical History:  Diagnosis Date   Anemia    Arthritis    Breast cancer (HCC)    right breast cancer 5 yrs ago   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   APPENDIX IN LUQ   Depression    Dysrhythmia    hx palpitations-too much caffiene   Headache    patient denies this dx   Scoliosis     Patient Active Problem List   Diagnosis Date Noted   IBS (irritable colon syndrome) 07/03/2023   Loss of weight 07/03/2023   Abdominal mass, left lower quadrant 05/10/2023   GERD (gastroesophageal reflux disease) 01/09/2023   Rectal bleeding 01/09/2023   H/O adenomatous polyp of colon 01/09/2023   S/P lumbar fusion 08/25/2022   Low back pain 08/02/2022   Stenosis of lateral recess of lumbar spine 08/02/2022   Pill dysphagia 04/19/2022   Women's annual routine gynecological examination 06/21/2021   Lumbar radiculopathy 06/11/2021   Lumbar pain 04/19/2021   Mass of upper outer quadrant of left breast 04/13/2021   Encounter for screening fecal occult blood testing 06/18/2020   Encounter for gynecological examination with Papanicolaou smear of cervix 06/18/2020   Mass of wrist 03/23/2020   Encounter for orthopedic follow-up care 03/17/2020   Left wrist pain 02/13/2020   NSAID induced gastritis    Anemia 12/20/2019   Dyspepsia 10/23/2019    Simple bone cyst 11/23/2018   Constipation 04/16/2018   Encounter for screening colonoscopy 04/16/2018   Displacement of cervical intervertebral disc without myelopathy 02/20/2017   Cancer of breast, intraductal, right 12/21/2016   Pap smear, as part of routine gynecological examination 12/21/2016   Anxiety 12/09/2015   Muscular aches 01/05/2015   Reactive depression 01/05/2015   Perimenopausal and postmenopausal vasomotor symptoms 10/09/2014   Hot flashes due to tamoxifen 07/28/2014   Memory loss 07/28/2014   Vision changes 07/28/2014   Encounter for routine gynecological examination 11/21/2013   Cancer of upper-inner quadrant of female breast (HCC) 10/11/2013   Breast cancer (HCC) 09/04/2013   Cervical spondylosis without myelopathy 04/03/2013   Pain in elbow 04/03/2013   Menorrhagia with irregular cycle 07/10/2012    Past Surgical History:  Procedure Laterality Date   ABDOMINAL EXPOSURE N/A 08/25/2022   Procedure: ABDOMINAL EXPOSURE;  Surgeon: Young Hensen, MD;  Location: [facility] OR;  Service: Vascular;  Laterality: N/A;   ANKLE SURGERY     left ankle cyst   ANTERIOR LUMBAR FUSION N/A 08/25/2022   Procedure: ANTERIOR LUMBAR FOUR TO FIVE INTERBODY FUSION, ILIAC CRST BONE GRAFT HARVEST;  Surgeon: Mort Ards, MD;  Location: [facility] OR;  Service: Orthopedics;  Laterality: N/A;  4 HRS DR. CLARK TO DO APPROACH 3 C-BED   BACK SURGERY  2000   lumbar disc surgery   BIOPSY  01/07/2020   Procedure:  BIOPSY;  Surgeon: Alyce Jubilee, MD;  Location: [facility];  Service: Endoscopy;;   BIOPSY  02/07/2023   Procedure: BIOPSY;  Surgeon: Vinetta Greening, DO;  Location: [facility];  Service: Endoscopy;;   BREAST EXCISIONAL BIOPSY Left    benign   BREAST SURGERY Left    left partail mastectomy-cyst   CERVICAL DISC SURGERY  1999   anterior and a posterier cerv fusionx2   CESAREAN SECTION     x2   COLONOSCOPY     COLONOSCOPY WITH PROPOFOL  N/A 06/26/2018   Procedure: COLONOSCOPY  WITH PROPOFOL ;  Surgeon: Alyce Jubilee, MD;  Location: [facility];  Service: Endoscopy;  Laterality: N/A;  11:00am   COLONOSCOPY WITH PROPOFOL  N/A 02/07/2023   Procedure: COLONOSCOPY WITH PROPOFOL ;  Surgeon: Vinetta Greening, DO;  Location: [facility];  Service: Endoscopy;  Laterality: N/A;  115pm, asa 2   DILATION AND CURETTAGE OF UTERUS     DILITATION & CURRETTAGE/HYSTROSCOPY WITH THERMACHOICE ABLATION  07/10/2012   Procedure: DILATATION & CURETTAGE/HYSTEROSCOPY WITH THERMACHOICE ABLATION;  Surgeon: Albino Hum, MD;  Location: [facility];  Service: Gynecology;  Laterality: N/A;  D5 13ml in, 13 ml out, temp 87degree celcius, total therapy time 16sec   ERCP     ESOPHAGOGASTRODUODENOSCOPY (EGD) WITH PROPOFOL  N/A 01/07/2020   Procedure: ESOPHAGOGASTRODUODENOSCOPY (EGD) WITH PROPOFOL ;  Surgeon: Alyce Jubilee, MD;  [facility];  Service: Endoscopy;  Laterality: N/A;  2:00pm   ESOPHAGOGASTRODUODENOSCOPY (EGD) WITH PROPOFOL  N/A 02/07/2023   Procedure: ESOPHAGOGASTRODUODENOSCOPY (EGD) WITH PROPOFOL ;  Surgeon: Vinetta Greening, DO;  [facility];  Service: Endoscopy;  Laterality: N/A;   EYE SURGERY     HARVEST BONE GRAFT N/A 08/25/2022   Procedure: HARVEST ILIAC BONE GRAFT;  Surgeon: Mort Ards, MD;  Location: [facility] OR;  Service: Orthopedics;  Laterality: N/A;   POLYPECTOMY  06/26/2018   Procedure: POLYPECTOMY;  Surgeon: Alyce Jubilee, MD;  Location: [facility];  Service: Endoscopy;;  colon   TUBAL LIGATION     with last c-section    OB History     Gravida  4   Para  2   Term      Preterm      AB      Living  2      SAB      IAB      Ectopic      Multiple      Live Births  2            Home Medications    Prior to Admission medications   Medication Sig Start Date End Date Taking? Authorizing Provider  ALPRAZolam  (XANAX ) 0.5 MG tablet Take 0.5 mg by mouth 3 (three) times daily as needed for anxiety.    [provider]   amitriptyline  (ELAVIL ) 50 MG tablet Take 50 mg by mouth at bedtime.    [provider]  bethanechol  (URECHOLINE ) 5 MG tablet Take 5 mg by mouth 3 (three) times daily.    [provider]  brompheniramine-pseudoephedrine-DM 30-2-10 MG/5ML syrup Take 5 mLs by mouth 4 (four) times daily as needed. 12/02/23   Leath-Warren, Belen Bowers, NP  calcium carbonate (OSCAL) 1500 (600 Ca) MG TABS tablet Take 600 mg by mouth in the morning.    [provider]  cetirizine  (ZYRTEC ) 10 MG tablet Take 1 tablet (10 mg total) by mouth daily. 12/02/23   Leath-Warren, Belen Bowers, NP  cholecalciferol (GNP VITAMIN D3)  10 MCG (400 UNIT) TABS tablet Take 400 Units by mouth in the morning.    [provider]  cyclobenzaprine  (FLEXERIL ) 10 MG tablet Take 10 mg by mouth 3 (three) times daily as needed for muscle spasms. 10/21/22   [provider]  escitalopram  (LEXAPRO ) 20 MG tablet Take 20 mg by mouth daily as needed.    [provider]  fluticasone  (FLONASE ) 50 MCG/ACT nasal spray Place 2 sprays into both nostrils daily. 12/02/23   Leath-Warren, Belen Bowers, NP  HYDROcodone -acetaminophen  (NORCO) 10-325 MG tablet Take 1 tablet by mouth every 6 (six) hours as needed.    [provider]  iron polysaccharides (NIFEREX) 150 MG capsule Take 150 mg by mouth daily as needed (low iron).    [provider]  lubiprostone  (AMITIZA ) 8 MCG capsule Take 1 capsule (8 mcg total) by mouth 2 (two) times daily with a meal. 07/03/23   Lanney Pitts, PA-C    Family History Family History  Problem Relation Age of Onset   Hypertension Mother    Colon cancer Neg Hx    Gastric cancer Neg Hx    Esophageal cancer Neg Hx     Social History Social History   Tobacco Use   Smoking status: Never   Smokeless tobacco: Never  Vaping Use   Vaping status: Never Used  Substance Use Topics   Alcohol use: No   Drug use: No     Allergies   Patient has no known  allergies.   Review of Systems Review of Systems Per HPI  Physical Exam Triage Vital Signs ED Triage Vitals  Encounter Vitals Group     BP 01/08/24 1031 120/83     Systolic BP Percentile --      Diastolic BP Percentile --      Pulse Rate 01/08/24 1031 88     Resp 01/08/24 1031 16     Temp 01/08/24 1031 98.3 F (36.8 C)     Temp Source 01/08/24 1031 Oral     SpO2 01/08/24 1031 96 %     Weight --      Height --      Head Circumference --      Peak Flow --      Pain Score 01/08/24 1034 0     Pain Loc --      Pain Education --      Exclude from Growth Chart --    No data found.  Updated Vital Signs BP 120/83 (BP Location: Left Arm)   Pulse 88   Temp 98.3 F (36.8 C) (Oral)   Resp 16   LMP 12/06/2013   SpO2 96%   Visual Acuity Right Eye Distance:   Left Eye Distance:   Bilateral Distance:    Right Eye Near:   Left Eye Near:    Bilateral Near:     Physical Exam Vitals and nursing note reviewed.  Constitutional:      General: She is not in acute distress.    Appearance: Normal appearance.  HENT:     Head: Normocephalic.     Right Ear: Tympanic membrane, ear canal and external ear normal.     Left Ear: Tympanic membrane, ear canal and external ear normal.     Nose: Nose normal.     Mouth/Throat:     Mouth: Mucous membranes are moist.  Eyes:     Extraocular Movements: Extraocular movements intact.     Pupils: Pupils are equal, round, and reactive to light.  Cardiovascular:     Rate and Rhythm: Normal rate and regular rhythm.     Pulses: Normal pulses.     Heart sounds: Normal heart sounds.  Pulmonary:     Effort: Pulmonary effort is normal. No respiratory distress.     Breath sounds: Normal breath sounds. No stridor. No wheezing, rhonchi or rales.  Abdominal:     General: Bowel sounds are normal.     Palpations: Abdomen is soft.     Tenderness: There is no abdominal tenderness.  Musculoskeletal:     Cervical back: Normal range of motion.  Skin:     General: Skin is warm and dry.  Neurological:     General: No focal deficit present.     Mental Status: She is alert and oriented to person, place, and time.     GCS: GCS eye subscore is 4. GCS verbal subscore is 5. GCS motor subscore is 6.     Cranial Nerves: Cranial nerves 2-12 are intact.     Sensory: Sensation is intact.     Motor: Motor function is intact.     Coordination: Coordination is intact.     Gait: Gait is intact.  Psychiatric:        Mood and Affect: Mood normal.        Behavior: Behavior normal.      UC Treatments / Results  Labs (all labs ordered are listed, but only abnormal results are displayed) Labs Reviewed - No data to display  EKG   Radiology No results found.  Procedures Procedures (including critical care time)  Medications Ordered in UC Medications - No data to display  Initial Impression / Assessment and Plan / UC Course  I have reviewed the triage vital signs and the nursing notes.  Pertinent labs & imaging results that were available during my care of the patient were reviewed by me and considered in my medical decision making (see chart for details).  Patient presents for complaints of dizziness, brain fog, and feeling as if she is going to pass out.  Symptoms have been present for the past week.  On exam, lung sounds are clear throughout, neurological exam is without any deficits.  Given patient's current ongoing symptoms, it is recommended that patient follow-up in the emergency department for further evaluation.  Differential diagnoses include TIA, vertigo and CVA.  Will have patient go to the emergency department for further evaluation.  Patient and spouse are in agreement with this plan of care and verbalized understanding.  Patient's vital signs are stable at discharge.  Patient ambulatory at discharge.  Final Clinical Impressions(s) / UC Diagnoses   Final diagnoses:  None   Discharge Instructions   None    ED Prescriptions    None    PDMP not reviewed this encounter.   Hardy Lia, NP 01/09/24 1131

## 2024-01-08 NOTE — ED Notes (Signed)
 Patient is being discharged from the Urgent Care and sent to the Emergency Department via PV . Per provider Gerlene Koh., patient is in need of higher level of care due to dizziness. Patient is aware and verbalizes understanding of plan of care.  Vitals:   01/08/24 1031  BP: 120/83  Pulse: 88  Resp: 16  Temp: 98.3 F (36.8 C)  SpO2: 96%

## 2024-01-08 NOTE — ED Triage Notes (Signed)
 Pt arrived via POV from UC for further evaluation of on-going dizziness and tingling sensation in her left foot. Pt reports mild weakness as well.

## 2024-01-08 NOTE — ED Provider Notes (Signed)
 [place] EMERGENCY DEPARTMENT AT [facility] Provider Note   CSN: [id number] Arrival date & time: 01/08/24  1136     History  Chief Complaint  Patient presents with   Dizziness    [name] is a 63 y.o. female. She presents the ER today from urgent care.  She went there complaining of left leg pain and weakness, this is a chronic problem for her.  She also mention to them that she was having episodes of lightheadedness recently as she sent to the ER for further workup.  States is happening approximately 2-3 times a day, happens whether she is sitting down or standing up, feels like she is going to pass out, denies spinning sensation or problems with balance when she ambulates.  Denies headache, denies blurry vision, denies weakness in her extremities, she does have some baseline tingling in her left toes which is a chronic problem.  No chest pain or shortness of breath, no fevers or chills, no nausea or vomiting.  She states been eating and drinking well, no diarrhea, no URI symptoms.  Dizziness      Home Medications Prior to Admission medications   Medication Sig Start Date End Date Taking? Authorizing Provider  ALPRAZolam  (XANAX ) 0.5 MG tablet Take 0.5 mg by mouth 3 (three) times daily as needed for anxiety.    [provider]  amitriptyline  (ELAVIL ) 50 MG tablet Take 50 mg by mouth at bedtime.    [provider]  bethanechol  (URECHOLINE ) 5 MG tablet Take 5 mg by mouth 3 (three) times daily.    [provider]  brompheniramine-pseudoephedrine-DM 30-2-10 MG/5ML syrup Take 5 mLs by mouth 4 (four) times daily as needed. 12/02/23   Leath-Warren, Belen Bowers, NP  calcium carbonate (OSCAL) 1500 (600 Ca) MG TABS tablet Take 600 mg by mouth in the morning.    [provider]  cetirizine  (ZYRTEC ) 10 MG tablet Take 1 tablet (10 mg total) by mouth daily. 12/02/23   Leath-Warren, Belen Bowers, NP  cholecalciferol (GNP VITAMIN D3) 10 MCG (400 UNIT) TABS  tablet Take 400 Units by mouth in the morning.    [provider]  cyclobenzaprine  (FLEXERIL ) 10 MG tablet Take 10 mg by mouth 3 (three) times daily as needed for muscle spasms. 10/21/22   [provider]  escitalopram  (LEXAPRO ) 20 MG tablet Take 20 mg by mouth daily as needed.    [provider]  fluticasone  (FLONASE ) 50 MCG/ACT nasal spray Place 2 sprays into both nostrils daily. 12/02/23   Leath-Warren, Belen Bowers, NP  HYDROcodone -acetaminophen  (NORCO) 10-325 MG tablet Take 1 tablet by mouth every 6 (six) hours as needed.    [provider]  iron polysaccharides (NIFEREX) 150 MG capsule Take 150 mg by mouth daily as needed (low iron).    [provider]  lubiprostone  (AMITIZA ) 8 MCG capsule Take 1 capsule (8 mcg total) by mouth 2 (two) times daily with a meal. 07/03/23   Lanney Pitts, PA-C      Allergies    Patient has no known allergies.    Review of Systems   Review of Systems  Neurological:  Positive for dizziness.    Physical Exam Updated Vital Signs BP 135/66   Pulse 93   Temp 98.2 F (36.8 C) (Oral)   Resp 20   Ht 5\' 7"  (1.702 m)   Wt 63.8 kg   LMP 12/06/2013   SpO2 100%   BMI 22.03 kg/m  Physical Exam Vitals and nursing note  reviewed.  Constitutional:      General: She is not in acute distress.    Appearance: She is well-developed.  HENT:     Head: Normocephalic and atraumatic.     Mouth/Throat:     Mouth: Mucous membranes are moist.  Eyes:     Conjunctiva/sclera: Conjunctivae normal.  Cardiovascular:     Rate and Rhythm: Normal rate and regular rhythm.     Heart sounds: No murmur heard. Pulmonary:     Effort: Pulmonary effort is normal. No respiratory distress.     Breath sounds: Normal breath sounds.  Abdominal:     Palpations: Abdomen is soft.     Tenderness: There is no abdominal tenderness.  Musculoskeletal:        General: No swelling. Normal range of motion.     Cervical back: Neck supple.      Comments: Normal range of motion bilateral hips knees and ankles.  Normal sensation left lower extremity to light touch.  DP and PT also intact in left.  Skin:    General: Skin is warm and dry.     Capillary Refill: Capillary refill takes less than 2 seconds.  Neurological:     General: No focal deficit present.     Mental Status: She is alert and oriented to person, place, and time.     Sensory: No sensory deficit.     Motor: No weakness.  Psychiatric:        Mood and Affect: Mood normal.     ED Results / Procedures / Treatments   Labs (all labs ordered are listed, but only abnormal results are displayed) Labs Reviewed  CBC WITH DIFFERENTIAL/PLATELET - Abnormal; Notable for the following components:      Result Value   WBC 3.8 (*)    RBC 3.75 (*)    All other components within normal limits  URINALYSIS, ROUTINE W REFLEX MICROSCOPIC  COMPREHENSIVE METABOLIC PANEL WITH GFR  LIPASE, BLOOD    EKG None  Radiology No results found.  Procedures Procedures    Medications Ordered in ED Medications - No data to display  ED Course/ Medical Decision Making/ A&P                                 Medical Decision Making This patient presents to the ED for concern of near syncope, this involves an extensive number of treatment options, and is a complaint that carries with it a high risk of complications and morbidity.  The differential diagnosis includes arrhythmia, PE, orthostatic hypotension, other   Co morbidities that complicate the patient evaluation  Remote history of breast cancer GERD, anemia, mild lumbar radiculopathy   Additional history obtained:  Additional history obtained from EMR External records from outside source obtained and reviewed including prior notes, labs   Lab Tests:  I Ordered, and personally interpreted labs.  The pertinent results include: CBC CMP-no anemia, mild leukopenia, CMP normal, lipase normal, UA normal      Problem List / ED  Course / Critical interventions / Medication management   Near Syncope-patient having lightheadedness that feels like she going to pass out, denies any spinning or vertigo sensation, normal neuroexam.  No headaches.  Has normal EKG, will continue to maintain on cardiac monitor, orthostatics did show blood pressure dropped by 20 points systolically but normal heart rate.  Will get D-dimer to help rule out PE as possible cause of dizziness and near  syncope.  If normal and she is feeling better we will plan to discharge with cardiac follow-up for ambulatory cardiac monitoring.  Left leg pain with toe tingling-patient has known chronic pain, known left leg symptoms chronically.  She has no swelling, no tenderness.  Normal sensation.  She is able to bear weight and ambulate.  Will have her follow-up outpatient. Signed out to PA Ula Gambler pending results of chest x-ray and D-dimer I have reviewed the patients home medicines and have made adjustments as needed      Amount and/or Complexity of Data Reviewed Labs: ordered. Radiology: ordered.           Final Clinical Impression(s) / ED Diagnoses Final diagnoses:  None    Rx / DC Orders ED Discharge Orders     None         Aimee Houseman, PA-C 01/08/24 1738    Iva Mariner, MD 01/09/24 0010

## 2024-01-08 NOTE — Discharge Instructions (Signed)
 Go to the emergency department for further evaluation.

## 2024-01-08 NOTE — ED Triage Notes (Signed)
 Pt reports dizziness, difficulty concentrating, and feeling like she is "going to blank out" x 1 week.

## 2024-01-08 NOTE — Discharge Instructions (Signed)
 You were seen for episodes of lightheadedness.  Fortunately your workup here was reassuring, you should follow-up with the cardiologist because you could be having abnormal heart rhythms causing this.  If you do have an episode where you lose consciousness or have this happen again where it does not go away please come back to the ER right away.  With your PCP for your low back pain flank pain.

## 2024-01-08 NOTE — ED Provider Notes (Signed)
 Care assumed from [facility] [place] at shift change.  Please see their note for further information.  Physical Exam  BP 136/79   Pulse 80   Temp 98.2 F (36.8 C) (Oral)   Resp 15   Ht 5\' 7"  (1.702 m)   Wt 63.8 kg   LMP 12/06/2013   SpO2 99%   BMI 22.03 kg/m   Physical Exam  Procedures  Procedures  ED Course / MDM    Medical Decision Making Amount and/or Complexity of Data Reviewed Labs: ordered. Radiology: ordered.  Risk Prescription drug management.   Briefly: Patient with near syncopal episodes a few times a day. No other associated symptoms.   Plan: Patient with slight drop in orthostatics on testing, plan for fluids, and reassess. D dimer also pending. If work-up negative and she feels better with fluids, likely d/c.  Patient d dimer elevated at >20.00, CTA ordered and is pending.   CTA has resulted and reveals  No acute intrathoracic abnormality; specifically, no pulmonary embolism, pneumonia, or pleural effusion.  I have personally reviewed and interpreted this imaging and agree with radiology interpretation.  Upon reassessment of patient, she is feeling substantially improved and ready to go home.  Specifically, she has not had any further episodes of lightheadedness since being here despite getting up and going to the bathroom a few times.  Plan for discharge with cardiology referral per previous providers recommendations. Evaluation and diagnostic testing in the emergency department does not suggest an emergent condition requiring admission or immediate intervention beyond what has been performed at this time.  Plan for discharge with close PCP follow-up.  Patient is understanding and amenable with plan, educated on red flag symptoms that would prompt immediate return.  Patient discharged in stable condition.     Armonee Bojanowski A, PA-C 01/08/24 2137    Iva Mariner, MD 01/09/24 Emelie Hanger

## 2024-01-08 NOTE — ED Notes (Signed)
 Pt denies dizziness or shob at this time

## 2024-01-10 DIAGNOSIS — I10 Essential (primary) hypertension: Secondary | ICD-10-CM | POA: Diagnosis not present

## 2024-01-10 DIAGNOSIS — F419 Anxiety disorder, unspecified: Secondary | ICD-10-CM | POA: Diagnosis not present

## 2024-01-10 DIAGNOSIS — R269 Unspecified abnormalities of gait and mobility: Secondary | ICD-10-CM | POA: Diagnosis not present

## 2024-01-10 DIAGNOSIS — Z6822 Body mass index (BMI) 22.0-22.9, adult: Secondary | ICD-10-CM | POA: Diagnosis not present

## 2024-01-10 DIAGNOSIS — R42 Dizziness and giddiness: Secondary | ICD-10-CM | POA: Diagnosis not present

## 2024-01-25 DIAGNOSIS — M5416 Radiculopathy, lumbar region: Secondary | ICD-10-CM | POA: Diagnosis not present

## 2024-02-07 DIAGNOSIS — M5416 Radiculopathy, lumbar region: Secondary | ICD-10-CM | POA: Diagnosis not present

## 2024-02-07 DIAGNOSIS — Z6823 Body mass index (BMI) 23.0-23.9, adult: Secondary | ICD-10-CM | POA: Diagnosis not present

## 2024-02-07 DIAGNOSIS — M549 Dorsalgia, unspecified: Secondary | ICD-10-CM | POA: Diagnosis not present

## 2024-02-08 ENCOUNTER — Other Ambulatory Visit (HOSPITAL_COMMUNITY): Payer: Self-pay | Admitting: Internal Medicine

## 2024-02-08 DIAGNOSIS — M549 Dorsalgia, unspecified: Secondary | ICD-10-CM

## 2024-02-16 ENCOUNTER — Ambulatory Visit (HOSPITAL_COMMUNITY)
Admission: RE | Admit: 2024-02-16 | Discharge: 2024-02-16 | Disposition: A | Discharge status: Home / Self Care | Source: Ambulatory Visit | Attending: Internal Medicine | Admitting: Internal Medicine

## 2024-02-16 DIAGNOSIS — M4807 Spinal stenosis, lumbosacral region: Secondary | ICD-10-CM | POA: Diagnosis not present

## 2024-02-16 DIAGNOSIS — M47816 Spondylosis without myelopathy or radiculopathy, lumbar region: Secondary | ICD-10-CM | POA: Diagnosis not present

## 2024-02-16 DIAGNOSIS — M549 Dorsalgia, unspecified: Secondary | ICD-10-CM | POA: Insufficient documentation

## 2024-02-16 DIAGNOSIS — M48061 Spinal stenosis, lumbar region without neurogenic claudication: Secondary | ICD-10-CM | POA: Diagnosis not present

## 2024-02-16 DIAGNOSIS — M51379 Other intervertebral disc degeneration, lumbosacral region without mention of lumbar back pain or lower extremity pain: Secondary | ICD-10-CM | POA: Diagnosis not present

## 2024-03-04 DIAGNOSIS — K219 Gastro-esophageal reflux disease without esophagitis: Secondary | ICD-10-CM | POA: Diagnosis not present

## 2024-03-04 DIAGNOSIS — M5416 Radiculopathy, lumbar region: Secondary | ICD-10-CM | POA: Diagnosis not present

## 2024-03-04 DIAGNOSIS — I10 Essential (primary) hypertension: Secondary | ICD-10-CM | POA: Diagnosis not present

## 2024-03-04 DIAGNOSIS — M549 Dorsalgia, unspecified: Secondary | ICD-10-CM | POA: Diagnosis not present

## 2024-03-05 DIAGNOSIS — M545 Low back pain, unspecified: Secondary | ICD-10-CM | POA: Diagnosis not present

## 2024-03-05 DIAGNOSIS — Z981 Arthrodesis status: Secondary | ICD-10-CM | POA: Diagnosis not present

## 2024-03-11 ENCOUNTER — Encounter: Payer: Self-pay | Admitting: Cardiology

## 2024-03-11 ENCOUNTER — Ambulatory Visit: Attending: Cardiology | Admitting: Cardiology

## 2024-03-11 VITALS — BP 118/75 | HR 104 | Ht 67.0 in | Wt 145.4 lb

## 2024-03-11 DIAGNOSIS — R42 Dizziness and giddiness: Secondary | ICD-10-CM

## 2024-03-11 NOTE — Patient Instructions (Signed)
Medication Instructions:  Continue all current medications.  Labwork: none  Testing/Procedures: none  Follow-Up: As needed.    Any Other Special Instructions Will Be Listed Below (If Applicable).  If you need a refill on your cardiac medications before your next appointment, please call your pharmacy.  

## 2024-03-11 NOTE — Progress Notes (Signed)
 Clinical Summary [name] is a 63 y.o.female seen today as a new patient for the following medical problems.   1.Dizziness/Near syncope - ER visit 01/08/24 with dizziness - from ER note SBP dropped 20 points with standing - EKG NSR - elevated Ddimer, CT PE was negative  -orthostatics are abnormal today. SBP 133-->100 and DBP 85-->69 lying to sitting.  - reports limited oral hydration.    Med side effects: amitryptiline but has not taken in months. Has flexeril , does take nightly   Past Medical History:  Diagnosis Date   Anemia    Arthritis    Breast cancer (HCC)    right breast cancer 5 yrs ago   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   APPENDIX IN LUQ   Depression    Dysrhythmia    hx palpitations-too much caffiene   Headache    patient denies this dx   Scoliosis      No Known Allergies   Current Outpatient Medications  Medication Sig Dispense Refill   ALPRAZolam  (XANAX ) 0.5 MG tablet Take 0.5 mg by mouth 3 (three) times daily as needed for anxiety.     amitriptyline  (ELAVIL ) 50 MG tablet Take 50 mg by mouth at bedtime.     bethanechol  (URECHOLINE ) 5 MG tablet Take 5 mg by mouth 3 (three) times daily.     brompheniramine-pseudoephedrine-DM 30-2-10 MG/5ML syrup Take 5 mLs by mouth 4 (four) times daily as needed. 140 mL 0   calcium carbonate (OSCAL) 1500 (600 Ca) MG TABS tablet Take 600 mg by mouth in the morning.     cetirizine  (ZYRTEC ) 10 MG tablet Take 1 tablet (10 mg total) by mouth daily. 30 tablet 0   cholecalciferol (GNP VITAMIN D3) 10 MCG (400 UNIT) TABS tablet Take 400 Units by mouth in the morning.     cyclobenzaprine  (FLEXERIL ) 10 MG tablet Take 10 mg by mouth 3 (three) times daily as needed for muscle spasms.     escitalopram  (LEXAPRO ) 20 MG tablet Take 20 mg by mouth daily as needed.     fluticasone  (FLONASE ) 50 MCG/ACT nasal spray Place 2 sprays into both nostrils daily. 16 g 0   HYDROcodone -acetaminophen  (NORCO) 10-325 MG tablet Take 1 tablet  by mouth every 6 (six) hours as needed.     iron polysaccharides (NIFEREX) 150 MG capsule Take 150 mg by mouth daily as needed (low iron).     lubiprostone  (AMITIZA ) 8 MCG capsule Take 1 capsule (8 mcg total) by mouth 2 (two) times daily with a meal. 60 capsule 11   No current facility-administered medications for this visit.     Past Surgical History:  Procedure Laterality Date   ABDOMINAL EXPOSURE N/A 08/25/2022   Procedure: ABDOMINAL EXPOSURE;  Surgeon: Gretta Lonni PARAS, MD;  Location: [facility] OR;  Service: Vascular;  Laterality: N/A;   ANKLE SURGERY     left ankle cyst   ANTERIOR LUMBAR FUSION N/A 08/25/2022   Procedure: ANTERIOR LUMBAR FOUR TO FIVE INTERBODY FUSION, ILIAC CRST BONE GRAFT HARVEST;  Surgeon: Burnetta Aures, MD;  Location: [facility] OR;  Service: Orthopedics;  Laterality: N/A;  4 HRS DR. CLARK TO DO APPROACH 3 C-BED   BACK SURGERY  2000   lumbar disc surgery   BIOPSY  01/07/2020   Procedure: BIOPSY;  Surgeon: Harvey Margo CROME, MD;  [facility]: [facility];  Service: Endoscopy;;   BIOPSY  02/07/2023   Procedure: BIOPSY;  Surgeon: Cindie Carlin POUR, DO;  [facility]: [facility];  Service:  Endoscopy;;   BREAST EXCISIONAL BIOPSY Left    benign   BREAST SURGERY Left    left partail mastectomy-cyst   CERVICAL DISC SURGERY  1999   anterior and a posterier cerv fusionx2   CESAREAN SECTION     x2   COLONOSCOPY     COLONOSCOPY WITH PROPOFOL  N/A 06/26/2018   Procedure: COLONOSCOPY WITH PROPOFOL ;  Surgeon: Harvey Margo CROME, MD;  [facility]: [facility];  Service: Endoscopy;  Laterality: N/A;  11:00am   COLONOSCOPY WITH PROPOFOL  N/A 02/07/2023   Procedure: COLONOSCOPY WITH PROPOFOL ;  Surgeon: Cindie Carlin POUR, DO;  [facility]: [facility];  Service: Endoscopy;  Laterality: N/A;  115pm, asa 2   DILATION AND CURETTAGE OF UTERUS     DILITATION & CURRETTAGE/HYSTROSCOPY WITH THERMACHOICE ABLATION  07/10/2012   Procedure: DILATATION & CURETTAGE/HYSTEROSCOPY WITH THERMACHOICE ABLATION;   Surgeon: Norleen LULLA Server, MD;  Location: [facility];  Service: Gynecology;  Laterality: N/A;  D5 13ml in, 13 ml out, temp 87degree celcius, total therapy time 16sec   ERCP     ESOPHAGOGASTRODUODENOSCOPY (EGD) WITH PROPOFOL  N/A 01/07/2020   Procedure: ESOPHAGOGASTRODUODENOSCOPY (EGD) WITH PROPOFOL ;  Surgeon: Harvey Margo CROME, MD;  [facility];  Service: Endoscopy;  Laterality: N/A;  2:00pm   ESOPHAGOGASTRODUODENOSCOPY (EGD) WITH PROPOFOL  N/A 02/07/2023   Procedure: ESOPHAGOGASTRODUODENOSCOPY (EGD) WITH PROPOFOL ;  Surgeon: Cindie Carlin POUR, DO;  [facility];  Service: Endoscopy;  Laterality: N/A;   EYE SURGERY     HARVEST BONE GRAFT N/A 08/25/2022   Procedure: HARVEST ILIAC BONE GRAFT;  Surgeon: Burnetta Aures, MD;  Location: [facility] OR;  Service: Orthopedics;  Laterality: N/A;   POLYPECTOMY  06/26/2018   Procedure: POLYPECTOMY;  Surgeon: Harvey Margo CROME, MD;  [facility]: [facility];  Service: Endoscopy;;  colon   TUBAL LIGATION     with last c-section     No Known Allergies    Family History  Problem Relation Age of Onset   Hypertension Mother    Colon cancer Neg Hx    Gastric cancer Neg Hx    Esophageal cancer Neg Hx      Social History [name] reports that she has never smoked. She has never used smokeless tobacco. [name] reports no history of alcohol use.    Physical Examination Today's Vitals   03/11/24 1017 03/11/24 1054  BP: (!) 148/70 118/75  Pulse: (!) 104   SpO2: 98%   Weight: 145 lb 6.4 oz (66 kg)   Height: 5' 7 (1.702 m)    Body mass index is 22.77 kg/m.  Gen: resting comfortably, no acute distress HEENT: no scleral icterus, pupils equal round and reactive, no palptable cervical adenopathy,  CV: RRR, no m/rg, no jvd Resp: Clear to auscultation bilaterally GI: abdomen is soft, non-tender, non-distended, normal bowel sounds, no hepatosplenomegaly MSK: extremities are warm, no edema.  Skin: warm, no rash Neuro:  no focal  deficits Psych: appropriate affect    Assessment and Plan  1.Orthostatic dizziness - likely secondary to poor oral hydration, regular cyclobenzaprine  use - orthostatics remains abnormal today but she has been asymptomatic since her ER visit - encouraged regular hydration, at least 2L of water  daily. Monitor symptoms - no indication for additional cardiac testing at this time   F/u as needed      Dorn PHEBE Ross, M.D.

## 2024-03-28 DIAGNOSIS — I10 Essential (primary) hypertension: Secondary | ICD-10-CM | POA: Diagnosis not present

## 2024-03-28 DIAGNOSIS — M5416 Radiculopathy, lumbar region: Secondary | ICD-10-CM | POA: Diagnosis not present

## 2024-03-28 DIAGNOSIS — F419 Anxiety disorder, unspecified: Secondary | ICD-10-CM | POA: Diagnosis not present

## 2024-03-28 DIAGNOSIS — M62830 Muscle spasm of back: Secondary | ICD-10-CM | POA: Diagnosis not present

## 2024-03-28 DIAGNOSIS — Z6823 Body mass index (BMI) 23.0-23.9, adult: Secondary | ICD-10-CM | POA: Diagnosis not present

## 2024-03-30 ENCOUNTER — Encounter: Payer: Self-pay | Admitting: Emergency Medicine

## 2024-03-30 ENCOUNTER — Other Ambulatory Visit: Payer: Self-pay

## 2024-03-30 ENCOUNTER — Ambulatory Visit (INDEPENDENT_AMBULATORY_CARE_PROVIDER_SITE_OTHER)

## 2024-03-30 ENCOUNTER — Ambulatory Visit
Admission: EM | Admit: 2024-03-30 | Discharge: 2024-03-30 | Disposition: A | Discharge status: Home / Self Care | Attending: Family Medicine | Admitting: Family Medicine

## 2024-03-30 DIAGNOSIS — M25552 Pain in left hip: Secondary | ICD-10-CM

## 2024-03-30 MED ORDER — TIZANIDINE HCL 4 MG PO CAPS: 4.0000 mg | ORAL_CAPSULE | Freq: Three times a day (TID) | ORAL | 0 refills | Status: DC | PRN

## 2024-03-30 MED ORDER — PREDNISONE 20 MG PO TABS: 40.0000 mg | ORAL_TABLET | Freq: Every day | ORAL | 0 refills | Status: DC

## 2024-03-30 NOTE — Discharge Instructions (Signed)
 In addition to the prescribed medications, you may use heat, massage, stretches, warm Epsom salt baths.  Follow-up with orthopedics if still not resolving.  Your x-ray was negative for any acute bony injury

## 2024-03-30 NOTE — ED Triage Notes (Signed)
 Pt reports tripped and fell a few months ago and reports recently pain has increased. Pt reports left hip pain that radiates to left leg and foot.

## 2024-03-31 NOTE — ED Provider Notes (Signed)
 [facility]    CSN: [id number] Arrival date & time: 03/30/24  0850      History   Chief Complaint Chief Complaint  Patient presents with   Hip Pain    HPI [name] is a 63 y.o. female.   Patient presenting today with ongoing left hip and buttock pain radiating to the left leg since a fall onto this hip a few months ago.  Denies bruising or skin changes, deformity, loss of range of motion, abnormal gait or balance, weakness numbness or tingling, bowel bladder incontinence, saddle anesthesia.  So far trying topical rubs with no relief.    Past Medical History:  Diagnosis Date   Anemia    Arthritis    Breast cancer (HCC)    right breast cancer 5 yrs ago   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   APPENDIX IN LUQ   Depression    Dysrhythmia    hx palpitations-too much caffiene   Headache    patient denies this dx   Scoliosis     Patient Active Problem List   Diagnosis Date Noted   IBS (irritable colon syndrome) 07/03/2023   Loss of weight 07/03/2023   Abdominal mass, left lower quadrant 05/10/2023   GERD (gastroesophageal reflux disease) 01/09/2023   Rectal bleeding 01/09/2023   H/O adenomatous polyp of colon 01/09/2023   S/P lumbar fusion 08/25/2022   Low back pain 08/02/2022   Stenosis of lateral recess of lumbar spine 08/02/2022   Pill dysphagia 04/19/2022   Women's annual routine gynecological examination 06/21/2021   Lumbar radiculopathy 06/11/2021   Lumbar pain 04/19/2021   Mass of upper outer quadrant of left breast 04/13/2021   Encounter for screening fecal occult blood testing 06/18/2020   Encounter for gynecological examination with Papanicolaou smear of cervix 06/18/2020   Mass of wrist 03/23/2020   Encounter for orthopedic follow-up care 03/17/2020   Left wrist pain 02/13/2020   NSAID induced gastritis    Anemia 12/20/2019   Dyspepsia 10/23/2019   Simple bone cyst 11/23/2018   Constipation 04/16/2018   Encounter for screening  colonoscopy 04/16/2018   Displacement of cervical intervertebral disc without myelopathy 02/20/2017   Cancer of breast, intraductal, right 12/21/2016   Pap smear, as part of routine gynecological examination 12/21/2016   Anxiety 12/09/2015   Muscular aches 01/05/2015   Reactive depression 01/05/2015   Perimenopausal and postmenopausal vasomotor symptoms 10/09/2014   Hot flashes due to tamoxifen 07/28/2014   Memory loss 07/28/2014   Vision changes 07/28/2014   Encounter for routine gynecological examination 11/21/2013   Cancer of upper-inner quadrant of female breast (HCC) 10/11/2013   Breast cancer (HCC) 09/04/2013   Cervical spondylosis without myelopathy 04/03/2013   Pain in elbow 04/03/2013   Menorrhagia with irregular cycle 07/10/2012    Past Surgical History:  Procedure Laterality Date   ABDOMINAL EXPOSURE N/A 08/25/2022   Procedure: ABDOMINAL EXPOSURE;  Surgeon: Gretta Lonni PARAS, MD;  Location: [facility] OR;  Service: Vascular;  Laterality: N/A;   ANKLE SURGERY     left ankle cyst   ANTERIOR LUMBAR FUSION N/A 08/25/2022   Procedure: ANTERIOR LUMBAR FOUR TO FIVE INTERBODY FUSION, ILIAC CRST BONE GRAFT HARVEST;  Surgeon: Burnetta Aures, MD;  Location: [facility] OR;  Service: Orthopedics;  Laterality: N/A;  4 HRS DR. CLARK TO DO APPROACH 3 C-BED   BACK SURGERY  2000   lumbar disc surgery   BIOPSY  01/07/2020   Procedure: BIOPSY;  Surgeon: Harvey Margo CROME, MD;  [facility];  Service: Endoscopy;;   BIOPSY  02/07/2023   Procedure: BIOPSY;  Surgeon: Cindie Carlin POUR, DO;  [facility];  Service: Endoscopy;;   BREAST EXCISIONAL BIOPSY Left    benign   BREAST SURGERY Left    left partail mastectomy-cyst   CERVICAL DISC SURGERY  1999   anterior and a posterier cerv fusionx2   CESAREAN SECTION     x2   COLONOSCOPY     COLONOSCOPY WITH PROPOFOL  N/A 06/26/2018   Procedure: COLONOSCOPY WITH PROPOFOL ;  Surgeon: Harvey Margo CROME, MD;  [facility];  Service:  Endoscopy;  Laterality: N/A;  11:00am   COLONOSCOPY WITH PROPOFOL  N/A 02/07/2023   Procedure: COLONOSCOPY WITH PROPOFOL ;  Surgeon: Cindie Carlin POUR, DO;  [facility];  Service: Endoscopy;  Laterality: N/A;  115pm, asa 2   DILATION AND CURETTAGE OF UTERUS     DILITATION & CURRETTAGE/HYSTROSCOPY WITH THERMACHOICE ABLATION  07/10/2012   Procedure: DILATATION & CURETTAGE/HYSTEROSCOPY WITH THERMACHOICE ABLATION;  Surgeon: Norleen LULLA Server, MD;  Location: [facility];  Service: Gynecology;  Laterality: N/A;  D5 13ml in, 13 ml out, temp 87degree celcius, total therapy time 16sec   ERCP     ESOPHAGOGASTRODUODENOSCOPY (EGD) WITH PROPOFOL  N/A 01/07/2020   Procedure: ESOPHAGOGASTRODUODENOSCOPY (EGD) WITH PROPOFOL ;  Surgeon: Harvey Margo CROME, MD;  [facility];  Service: Endoscopy;  Laterality: N/A;  2:00pm   ESOPHAGOGASTRODUODENOSCOPY (EGD) WITH PROPOFOL  N/A 02/07/2023   Procedure: ESOPHAGOGASTRODUODENOSCOPY (EGD) WITH PROPOFOL ;  Surgeon: Cindie Carlin POUR, DO;  [facility];  Service: Endoscopy;  Laterality: N/A;   EYE SURGERY     HARVEST BONE GRAFT N/A 08/25/2022   Procedure: HARVEST ILIAC BONE GRAFT;  Surgeon: Burnetta Aures, MD;  Location: [facility];  Service: Orthopedics;  Laterality: N/A;   POLYPECTOMY  06/26/2018   Procedure: POLYPECTOMY;  Surgeon: Harvey Margo CROME, MD;  [facility];  Service: Endoscopy;;  colon   TUBAL LIGATION     with last c-section    OB History     Gravida  4   Para  2   Term      Preterm      AB      Living  2      SAB      IAB      Ectopic      Multiple      Live Births  2            Home Medications    Prior to Admission medications   Medication Sig Start Date End Date Taking? Authorizing Provider  predniSONE  (DELTASONE ) 20 MG tablet Take 2 tablets (40 mg total) by mouth daily with breakfast. 03/30/24  Yes Stuart Vernell Norris, PA-C  tiZANidine  (ZANAFLEX ) 4 MG capsule Take 1 capsule (4 mg total) by  mouth 3 (three) times daily as needed for muscle spasms. Do not drink alcohol or drive while taking this medication.  May cause drowsiness. 03/30/24  Yes Stuart Vernell Norris, PA-C  ALPRAZolam  (XANAX ) 0.5 MG tablet Take 0.5 mg by mouth 3 (three) times daily as needed for anxiety.    [provider]  amitriptyline  (ELAVIL ) 50 MG tablet Take 50 mg by mouth at bedtime.    [provider]  bethanechol  (URECHOLINE ) 5 MG tablet Take 5 mg by mouth 3 (three) times daily.    [provider]  brompheniramine-pseudoephedrine-DM 30-2-10 MG/5ML syrup Take 5 mLs by mouth 4 (four) times daily as needed. 12/02/23  Leath-Warren, Etta PARAS, NP  calcium carbonate (OSCAL) 1500 (600 Ca) MG TABS tablet Take 600 mg by mouth in the morning.    [provider]  cetirizine  (ZYRTEC ) 10 MG tablet Take 1 tablet (10 mg total) by mouth daily. 12/02/23   Leath-Warren, Etta PARAS, NP  cholecalciferol (GNP VITAMIN D3) 10 MCG (400 UNIT) TABS tablet Take 400 Units by mouth in the morning.    [provider]  cyclobenzaprine  (FLEXERIL ) 10 MG tablet Take 10 mg by mouth 3 (three) times daily as needed for muscle spasms. 10/21/22   [provider]  escitalopram  (LEXAPRO ) 20 MG tablet Take 20 mg by mouth daily as needed.    [provider]  fluticasone  (FLONASE ) 50 MCG/ACT nasal spray Place 2 sprays into both nostrils daily. 12/02/23   Leath-Warren, Etta PARAS, NP  HYDROcodone -acetaminophen  (NORCO) 10-325 MG tablet Take 1 tablet by mouth every 6 (six) hours as needed.    [provider]  iron polysaccharides (NIFEREX) 150 MG capsule Take 150 mg by mouth daily as needed (low iron).    [provider]  lubiprostone  (AMITIZA ) 8 MCG capsule Take 1 capsule (8 mcg total) by mouth 2 (two) times daily with a meal. 07/03/23   Ezzard Sonny RAMAN, PA-C    Family History Family History  Problem Relation Age of Onset   Hypertension Mother    Colon cancer Neg Hx    Gastric  cancer Neg Hx    Esophageal cancer Neg Hx     Social History Social History   Tobacco Use   Smoking status: Never   Smokeless tobacco: Never  Vaping Use   Vaping status: Never Used  Substance Use Topics   Alcohol use: No   Drug use: No     Allergies   Patient has no known allergies.   Review of Systems Review of Systems Per HPI  Physical Exam Triage Vital Signs ED Triage Vitals  Encounter Vitals Group     BP 03/30/24 0859 107/72     Girls Systolic BP Percentile --      Girls Diastolic BP Percentile --      Boys Systolic BP Percentile --      Boys Diastolic BP Percentile --      Pulse Rate 03/30/24 0859 92     Resp 03/30/24 0859 20     Temp 03/30/24 0859 97.8 F (36.6 C)     Temp Source 03/30/24 0859 Oral     SpO2 03/30/24 0859 97 %     Weight --      Height --      Head Circumference --      Peak Flow --      Pain Score 03/30/24 0900 10     Pain Loc --      Pain Education --      Exclude from Growth Chart --    No data found.  Updated Vital Signs BP 107/72 (BP Location: Right Arm)   Pulse 92   Temp 97.8 F (36.6 C) (Oral)   Resp 20   LMP 12/06/2013   SpO2 97%   Visual Acuity Right Eye Distance:   Left Eye Distance:   Bilateral Distance:    Right Eye Near:   Left Eye Near:    Bilateral Near:     Physical Exam Vitals and nursing note reviewed.  Constitutional:      Appearance: Normal appearance. She is not ill-appearing.  HENT:     Head: Atraumatic.  Eyes:  Extraocular Movements: Extraocular movements intact.     Conjunctiva/sclera: Conjunctivae normal.  Cardiovascular:     Rate and Rhythm: Normal rate.  Pulmonary:     Effort: Pulmonary effort is normal.  Musculoskeletal:        General: Tenderness and signs of injury present. No swelling or deformity. Normal range of motion.     Cervical back: Normal range of motion and neck supple.     Comments: Left lumbar and buttock tenderness to palpation extending down into hip and left  lower extremity.  Range of motion of left lower extremity intact both passive and active, no midline spinal tenderness to palpation diffusely, negative straight leg raise bilateral lower extremities.  Skin:    General: Skin is warm and dry.     Findings: No bruising or erythema.  Neurological:     Mental Status: She is alert and oriented to person, place, and time.     Comments: Bilateral lower extremities neurovascularly intact  Psychiatric:        Mood and Affect: Mood normal.        Thought Content: Thought content normal.        Judgment: Judgment normal.      UC Treatments / Results  Labs (all labs ordered are listed, but only abnormal results are displayed) Labs Reviewed - No data to display  EKG   Radiology DG Hip Unilat With Pelvis 2-3 Views Left Result Date: 03/30/2024 CLINICAL DATA:  Left hip pain. EXAM: DG HIP (WITH OR WITHOUT PELVIS) 2-3V LEFT COMPARISON:  None Available. FINDINGS: There is no evidence of hip fracture or dislocation. There is no evidence of arthropathy or other focal bone abnormality. Prominent stool volume in the distal colon and rectum. IMPRESSION: No acute bony abnormality. Electronically Signed   By: Camellia Candle M.D.   On: 03/30/2024 10:02    Procedures Procedures (including critical care time)  Medications Ordered in UC Medications - No data to display  Initial Impression / Assessment and Plan / UC Course  I have reviewed the triage vital signs and the nursing notes.  Pertinent labs & imaging results that were available during my care of the patient were reviewed by me and considered in my medical decision making (see chart for details).     X-ray of the left hip negative for acute bony abnormality, suspect contusion from the recent fall.  No red flag findings on exam today.  Will treat with prednisone , Zanaflex , heat, stretches, rest.  Return for worsening symptoms. Final Clinical Impressions(s) / UC Diagnoses   Final diagnoses:  Left  hip pain     Discharge Instructions      In addition to the prescribed medications, you may use heat, massage, stretches, warm Epsom salt baths.  Follow-up with orthopedics if still not resolving.  Your x-ray was negative for any acute bony injury    ED Prescriptions     Medication Sig Dispense Auth. Provider   tiZANidine  (ZANAFLEX ) 4 MG capsule Take 1 capsule (4 mg total) by mouth 3 (three) times daily as needed for muscle spasms. Do not drink alcohol or drive while taking this medication.  May cause drowsiness. 15 capsule Stuart Vernell Norris, PA-C   predniSONE  (DELTASONE ) 20 MG tablet Take 2 tablets (40 mg total) by mouth daily with breakfast. 10 tablet Stuart Vernell Norris, [place]      PDMP not reviewed this encounter.   Stuart Vernell Norris, [place] 03/31/24 1506

## 2024-04-02 ENCOUNTER — Ambulatory Visit: Admitting: Cardiology

## 2024-05-07 DIAGNOSIS — M545 Low back pain, unspecified: Secondary | ICD-10-CM | POA: Diagnosis not present

## 2024-05-15 DIAGNOSIS — M545 Low back pain, unspecified: Secondary | ICD-10-CM | POA: Diagnosis not present

## 2024-05-20 ENCOUNTER — Ambulatory Visit: Payer: Self-pay

## 2024-05-20 NOTE — Telephone Encounter (Signed)
 FYI Only or Action Required?: FYI only for provider.  Patient was last seen in primary care on new patient.  Called Nurse Triage reporting Back Pain.  Symptoms began several years ago.  Interventions attempted: Prescription medications: flexeril , vicodin, xanax .  Symptoms are: unchanged.  Triage Disposition: See HCP Within 4 Hours (Or PCP Triage)  Patient/caregiver understands and will follow disposition?: No, wishes to speak with PCP   Copied from CRM [id number] [id number]. Topic: Clinical - Red Word Triage >> May 20, 2024  2:33 PM Tobias L wrote: Red Word that prompted transfer to Nurse Triage: patient having severe back pain Reason for Disposition  [1] Pain radiates into the thigh or further down the leg AND [2] both legs  Answer Assessment - Initial Assessment Questions 1. ONSET: When did the pain begin? (e.g., minutes, hours, days)     After surgery 1.5 years ago 2. LOCATION: Where does it hurt? (upper, mid or lower back)     Lower back 3. SEVERITY: How bad is the pain?  (e.g., Scale 1-10; mild, moderate, or severe)     10/10 4. PATTERN: Is the pain constant? (e.g., yes, no; constant, intermittent)      constant 5. RADIATION: Does the pain shoot into your legs or somewhere else?     Left leg 6. CAUSE:  What do you think is causing the back pain?      Unsure, started after back surgery one an one half years ago 7. BACK OVERUSE:  Any recent lifting of heavy objects, strenuous work or exercise?     denies 8. MEDICINES: What have you taken so far for the pain? (e.g., nothing, acetaminophen , NSAIDS)     Attempted PT, but caused increased pain and difficulty walking.  Has also be prescribed vidodin, flexeril  and xanax  9. NEUROLOGIC SYMPTOMS: Do you have any weakness, numbness, or problems with bowel/bladder control?     Tingling down left leg 10. OTHER SYMPTOMS: Do you have any other symptoms? (e.g., fever, abdomen pain, burning with urination, blood in urine)        denies 11. PREGNANCY: Is there any chance you are pregnant? When was your last menstrual period?       N/a  Protocols used: Back Pain-A-AH

## 2024-05-24 DIAGNOSIS — M545 Low back pain, unspecified: Secondary | ICD-10-CM | POA: Diagnosis not present

## 2024-05-28 ENCOUNTER — Encounter: Payer: Self-pay | Admitting: Family Medicine

## 2024-05-28 ENCOUNTER — Ambulatory Visit: Admitting: Family Medicine

## 2024-05-28 VITALS — BP 130/90 | HR 93 | Temp 98.5°F | Ht 67.0 in | Wt 147.5 lb

## 2024-05-28 DIAGNOSIS — M545 Low back pain, unspecified: Secondary | ICD-10-CM

## 2024-05-28 DIAGNOSIS — R4789 Other speech disturbances: Secondary | ICD-10-CM | POA: Insufficient documentation

## 2024-05-28 DIAGNOSIS — Z7689 Persons encountering health services in other specified circumstances: Secondary | ICD-10-CM

## 2024-05-28 DIAGNOSIS — R29898 Other symptoms and signs involving the musculoskeletal system: Secondary | ICD-10-CM | POA: Insufficient documentation

## 2024-05-28 DIAGNOSIS — G8929 Other chronic pain: Secondary | ICD-10-CM | POA: Diagnosis not present

## 2024-05-28 DIAGNOSIS — Z23 Encounter for immunization: Secondary | ICD-10-CM

## 2024-05-28 DIAGNOSIS — R4189 Other symptoms and signs involving cognitive functions and awareness: Secondary | ICD-10-CM

## 2024-05-28 DIAGNOSIS — Z923 Personal history of irradiation: Secondary | ICD-10-CM | POA: Insufficient documentation

## 2024-05-28 DIAGNOSIS — F419 Anxiety disorder, unspecified: Secondary | ICD-10-CM | POA: Diagnosis not present

## 2024-05-28 DIAGNOSIS — E785 Hyperlipidemia, unspecified: Secondary | ICD-10-CM

## 2024-05-28 DIAGNOSIS — N951 Menopausal and female climacteric states: Secondary | ICD-10-CM | POA: Diagnosis not present

## 2024-05-28 MED ORDER — PREGABALIN 50 MG PO CAPS: 50.0000 mg | ORAL_CAPSULE | Freq: Two times a day (BID) | ORAL | 0 refills | Status: DC

## 2024-05-28 MED ORDER — CYCLOBENZAPRINE HCL 10 MG PO TABS: 10.0000 mg | ORAL_TABLET | Freq: Three times a day (TID) | ORAL | 0 refills | Status: AC | PRN

## 2024-05-28 MED ORDER — BUSPIRONE HCL 5 MG PO TABS: 5.0000 mg | ORAL_TABLET | Freq: Two times a day (BID) | ORAL | 0 refills | Status: DC

## 2024-05-28 NOTE — Progress Notes (Addendum)
 Patient Office Visit  Assessment & Plan:  Encounter to establish care -     CBC with Differential/Platelet -     Comprehensive metabolic panel with GFR -     TSH -     VITAMIN D  25 Hydroxy (Vit-D Deficiency, Fractures)  Immunization due -     Flu vaccine trivalent PF, 6mos and older(Flulaval,Afluria ,Fluarix,Fluzone)  Chronic bilateral low back pain without sciatica -     Cyclobenzaprine  HCl; Take 1 tablet (10 mg total) by mouth 3 (three) times daily as needed for muscle spasms.  Dispense: 90 tablet; Refill: 0 -     Ambulatory referral to Pain Clinic -     Pregabalin ; Take 1 capsule (50 mg total) by mouth 2 (two) times daily.  Dispense: 60 capsule; Refill: 0  Anxiety -     busPIRone  HCl; Take 1 tablet (5 mg total) by mouth 2 (two) times daily.  Dispense: 60 tablet; Refill: 0  Hyperlipidemia, unspecified hyperlipidemia type -     Lipid panel  Need for pneumococcal 20-valent conjugate vaccination -     Pneumococcal conjugate vaccine 20-valent  Cognitive change -     Vitamin B12   Assessment and Plan    Chronic low back pain with radiculopathy and lumbar spondylosis Persistent severe pain post-surgery with inadequate current management. MRI shows arthritis and bulging discs. - Refer to pain management clinic for further evaluation and management. - Initiate Lyrica  50 mg twice daily, adjust dosage based on response. - Discontinue gabapentin . - Consider tramadol  for pain management.  Opioid-induced constipation Intermittent constipation due to chronic hydrocodone  use. - Continue laxatives as needed.  Cognitive impairment due to polypharmacy Cognitive impairment likely from hydrocodone  and Xanax  use, with overdose history. - Discontinue Xanax . - Prescribe Buspar  for anxiety. - Educated on polypharmacy risks including cognitive impairment and respiratory depression.  Generalized anxiety disorder and depression Managed with Lexapro , transition from Xanax  to Buspar . -  Continue Lexapro  20 mg daily. - Prescribe Buspar  to replace Xanax .     Test results were reviewed and analyzed as part of the medical decision making of this visit.  Reviewed MRI of the lumbar spine during the office visit. Start Lyrica  50 mg twice a day may need to increase this.  Continue Flexeril  at night for now BuSpar  5 mg twice a day as needed for anxiety instead of the Xanax .  Pain management consult ordered.  Flu shot pneumococcal vaccines given.  Return in next 2 to 3 weeks or sooner if necessary.     No follow-ups on file.   Subjective:    Patient ID: [name], female    DOB: Jul 06, 1961  Age: 64 y.o. MRN: [id number]  Chief Complaint  Patient presents with   Establish Care   Medical Management of Chronic Issues    HPI Discussed the use of AI scribe software for clinical note transcription with the patient, who gave verbal consent to proceed.  History of Present Illness        [name] is a 63 year old female with chronic pain and anxiety who presents for pain management and medication review. Patient is here to establish primary care.   She experiences chronic pain primarily in her lower back, rated as a constant 10 out of 10, since an injury in 1999. She has been taking hydrocodone  every other day since the injury, but it no longer alleviates her pain. She has tried gabapentin  in the past without relief but has not tried Lyrica  or Cymbalta. She  also experiences numbness and tingling in her left leg and toes, which she attributes to her back pain. She reports that she underwent back surgery about a year and a half ago, which did not provide relief. patient has seen Neurosurgeon in [place] and was told that surgery may not be a good option. That neurosurgeon has retired since her consultation. She does have upcoming appt with Dr. Burnetta at [facility] Per husband patient took both her hydrocodone  and xanax  together which caused her to be confused and pass out leading to ER visit this  year. Patient does not like the way she feels the following morning after taking her pain med with xanax  and flexeril . She feels like she is in a fog and people think she has dementia or memory loss.   She has a history of anxiety and has been taking Xanax  daily since 1999. She also takes Lexapro  20 mg daily, which helps with both anxiety and depression. She experiences memory fog, which she attributes to her medication regimen by taking xanax , hydrocodone  and flexeril , and has been told by others that she appears to have memory issues.  She has a history of breast cancer, for which she underwent surgery to remove a mass in the right breast. She did not require chemotherapy or radiation. She had a mammogram in March of this year. She also had a colonoscopy three years ago, which was normal.  She reports sleep disturbances and takes a combination of medications, including Flexeril  and Xanax , to aid sleep. She wakes up frequently during the night due to pain. She has not tried other sleep aids like trazodone. She does find that Flexeril  makes her very sedated in feeling like she is a fog. patient is not taking Zanaflex ( t is listed in EPIC)  She experiences occasional constipation, which she manages with laxatives, attributing this to her hydrocodone  use.  She maintains a diet that includes fruits and vegetables but consumes a lot of sweets. She had a tubal ligation but retains her uterus.  She has been on Crestor for cholesterol management, prescribed by a previous doctor, Dr. Lucas. She is tolerating this medication so far.  Physical Exam Results RADIOLOGY Mammogram: Normal (11/2023) Lumbar spine MRI: Degenerative changes consistent with arthritis in the lumbar region (02/2024) MPRESSION: Moderate degenerative disc disease L5-S1 and L2-3 with mild discogenic endplate marrow edema. Multilevel degenerative spondylosis with levels described in detail above.   L5-S1 has a small right paracentral  protrusion with possible contact of the right S1 nerve. No impingement.   L2-3 has a small distal right foraminal protrusion causing mild foraminal narrowing. Electronically signed by: Reyes Frees MD 02/28/2024 10:59 AM EDT RP Workstation: [id number]          DIAGNOSTIC Colonoscopy: Normal (2024) Assessment & Plan Chronic low back pain with radiculopathy and lumbar spondylosis Persistent severe pain post-surgery with inadequate current management. MRI shows arthritis and bulging discs. - Refer to pain management clinic for further evaluation and management. - Initiate Lyrica  50 mg twice daily, adjust dosage based on response. - Discontinue gabapentin . - Consider tramadol  for pain management.  Opioid-induced constipation Intermittent constipation due to chronic hydrocodone  use. - Continue laxatives as needed.  Cognitive impairment due to polypharmacy Cognitive impairment likely from hydrocodone  and Xanax  use, with overdose history. - Discontinue Xanax . - Prescribe Buspar  for anxiety. - Educated on polypharmacy risks including cognitive impairment and respiratory depression.  Generalized anxiety disorder and depression Managed with Lexapro , transition from Xanax  to Buspar . - Continue Lexapro   20 mg daily. - Prescribe Buspar  to replace Xanax .  The 10-year ASCVD risk score (Arnett DK, et al., 2019) is: 7.2%   Values used to calculate the score:     Age: 44 years     Clincally relevant sex: Female     Is Non-Hispanic African American: Yes     Diabetic: No     Tobacco smoker: No     Systolic Blood Pressure: 130 mmHg     Is BP treated: No     HDL Cholesterol: 97 mg/dL     Total Cholesterol: 227 mg/dL   The ASCVD Risk score (Arnett DK, et al., 2019) failed to calculate for the following reasons:   Cannot find a previous HDL lab   Cannot find a previous total cholesterol lab  Past Medical History:  Diagnosis Date   Anemia    Arthritis    Breast cancer (HCC)     right breast cancer 5 yrs ago   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   APPENDIX IN [facility]   Depression    Dysrhythmia    hx palpitations-too much caffiene   Headache    patient denies this dx   Scoliosis    Past Surgical History:  Procedure Laterality Date   ABDOMINAL EXPOSURE N/A 08/25/2022   Procedure: ABDOMINAL EXPOSURE;  Surgeon: Gretta Lonni PARAS, MD;  Location: [facility];  Service: Vascular;  Laterality: N/A;   ANKLE SURGERY     left ankle cyst   ANTERIOR LUMBAR FUSION N/A 08/25/2022   Procedure: ANTERIOR LUMBAR FOUR TO FIVE INTERBODY FUSION, ILIAC CRST BONE GRAFT HARVEST;  Surgeon: Burnetta Aures, MD;  Location: [facility] OR;  Service: Orthopedics;  Laterality: N/A;  4 HRS DR. CLARK TO DO APPROACH 3 C-BED   BACK SURGERY  2000   lumbar disc surgery   BIOPSY  01/07/2020   Procedure: BIOPSY;  Surgeon: Harvey Margo CROME, MD;  [facility]: [facility];  Service: Endoscopy;;   BIOPSY  02/07/2023   Procedure: BIOPSY;  Surgeon: Cindie Carlin POUR, DO;  [facility];  Service: Endoscopy;;   BREAST EXCISIONAL BIOPSY Left    benign   BREAST SURGERY Left    left partail mastectomy-cyst   CERVICAL DISC SURGERY  1999   anterior and a posterier cerv fusionx2   CESAREAN SECTION     x2   COLONOSCOPY     COLONOSCOPY WITH PROPOFOL  N/A 06/26/2018   Procedure: COLONOSCOPY WITH PROPOFOL ;  Surgeon: Harvey Margo CROME, MD;  [facility];  Service: Endoscopy;  Laterality: N/A;  11:00am   COLONOSCOPY WITH PROPOFOL  N/A 02/07/2023   Procedure: COLONOSCOPY WITH PROPOFOL ;  Surgeon: Cindie Carlin POUR, DO;  [facility];  Service: Endoscopy;  Laterality: N/A;  115pm, asa 2   DILATION AND CURETTAGE OF UTERUS     DILITATION & CURRETTAGE/HYSTROSCOPY WITH THERMACHOICE ABLATION  07/10/2012   Procedure: DILATATION & CURETTAGE/HYSTEROSCOPY WITH THERMACHOICE ABLATION;  Surgeon: Norleen LULLA Server, MD;  Location: [facility];  Service: Gynecology;  Laterality: N/A;  D5 13ml in, 13 ml out, temp  87degree celcius, total therapy time 16sec   ERCP     ESOPHAGOGASTRODUODENOSCOPY (EGD) WITH PROPOFOL  N/A 01/07/2020   Procedure: ESOPHAGOGASTRODUODENOSCOPY (EGD) WITH PROPOFOL ;  Surgeon: Harvey Margo CROME, MD;  [facility];  Service: Endoscopy;  Laterality: N/A;  2:00pm   ESOPHAGOGASTRODUODENOSCOPY (EGD) WITH PROPOFOL  N/A 02/07/2023   Procedure: ESOPHAGOGASTRODUODENOSCOPY (EGD) WITH PROPOFOL ;  Surgeon: Cindie Carlin POUR, DO;  [facility];  Service: Endoscopy;  Laterality: N/A;   EYE SURGERY     HARVEST BONE GRAFT N/A 08/25/2022   Procedure: HARVEST ILIAC BONE GRAFT;  Surgeon: Burnetta Aures, MD;  Location: [facility];  Service: Orthopedics;  Laterality: N/A;   POLYPECTOMY  06/26/2018   Procedure: POLYPECTOMY;  Surgeon: Harvey Margo CROME, MD;  [facility];  Service: Endoscopy;;  colon   TUBAL LIGATION     with last c-section   Social History   Tobacco Use   Smoking status: Never   Smokeless tobacco: Never  Vaping Use   Vaping status: Never Used  Substance Use Topics   Alcohol use: No   Drug use: No   Family History  Problem Relation Age of Onset   Hypertension Mother    Colon cancer Neg Hx    Gastric cancer Neg Hx    Esophageal cancer Neg Hx    No Known Allergies  ROS    Objective:    BP (!) 130/90   Pulse 93   Temp 98.5 F (36.9 C)   Ht 5' 7 (1.702 m)   Wt 147 lb 8 oz (66.9 kg)   LMP 12/06/2013   SpO2 99%   BMI 23.10 kg/m  BP Readings from Last 3 Encounters:  05/28/24 (!) 130/90  03/30/24 107/72  03/11/24 118/75   Wt Readings from Last 3 Encounters:  05/28/24 147 lb 8 oz (66.9 kg)  03/11/24 145 lb 6.4 oz (66 kg)  01/08/24 140 lb 10.5 oz (63.8 kg)    Physical Exam Vitals and nursing note reviewed.  Constitutional:      General: She is not in acute distress.    Appearance: Normal appearance.     Comments: Comes in with her husband (he answers a lot of my questions)  HENT:     Head: Normocephalic.     Right Ear: Tympanic  membrane, ear canal and external ear normal.     Left Ear: Tympanic membrane, ear canal and external ear normal.  Eyes:     Extraocular Movements: Extraocular movements intact.     Conjunctiva/sclera: Conjunctivae normal.     Pupils: Pupils are equal, round, and reactive to light.  Cardiovascular:     Rate and Rhythm: Normal rate and regular rhythm.     Heart sounds: Normal heart sounds.  Pulmonary:     Effort: Pulmonary effort is normal.     Breath sounds: Normal breath sounds. No wheezing.  Musculoskeletal:     Lumbar back: Spasms and tenderness present.     Right lower leg: No edema.     Left lower leg: No edema.     Comments: Patient has left sided low back pain, states the pain is 10/10 sitting in chair. Surgical scar noted over midline   Neurological:     General: No focal deficit present.     Mental Status: She is alert and oriented to person, place, and time.  Psychiatric:        Attention and Perception: Attention normal.        Mood and Affect: Mood normal.        Speech: Speech normal.        Behavior: Behavior normal.     Comments: Patient does appear sleepy this AM      No results found for any visits on 05/28/24.

## 2024-05-29 ENCOUNTER — Ambulatory Visit: Payer: Self-pay | Admitting: Family Medicine

## 2024-05-29 LAB — COMPREHENSIVE METABOLIC PANEL WITH GFR
AG Ratio: 1.6 (calc) (ref 1.0–2.5)
ALT: 13 U/L (ref 6–29)
AST: 22 U/L (ref 10–35)
Albumin: 4.4 g/dL (ref 3.6–5.1)
Alkaline phosphatase (APISO): 71 U/L (ref 37–153)
BUN: 9 mg/dL (ref 7–25)
CO2: 30 mmol/L (ref 20–32)
Calcium: 9.6 mg/dL (ref 8.6–10.4)
Chloride: 103 mmol/L (ref 98–110)
Creat: 0.52 mg/dL (ref 0.50–1.05)
Globulin: 2.7 g/dL (ref 1.9–3.7)
Glucose, Bld: 105 mg/dL — ABNORMAL HIGH (ref 65–99)
Potassium: 4.1 mmol/L (ref 3.5–5.3)
Sodium: 140 mmol/L (ref 135–146)
Total Bilirubin: 0.7 mg/dL (ref 0.2–1.2)
Total Protein: 7.1 g/dL (ref 6.1–8.1)
eGFR: 104 mL/min/1.73m2 (ref >=60)

## 2024-05-29 LAB — CBC WITH DIFFERENTIAL/PLATELET
Absolute Lymphocytes: 1811 {cells}/uL (ref 850–3900)
Absolute Monocytes: 421 {cells}/uL (ref 200–950)
Basophils Absolute: 61 {cells}/uL (ref 0–200)
Basophils Relative: 1.7 %
Eosinophils Absolute: 79 {cells}/uL (ref 15–500)
Eosinophils Relative: 2.2 %
HCT: 37.5 % (ref 35.0–45.0)
Hemoglobin: 12.1 g/dL (ref 11.7–15.5)
MCH: 30.9 pg (ref 27.0–33.0)
MCHC: 32.3 g/dL (ref 32.0–36.0)
MCV: 95.9 fL (ref 80.0–100.0)
MPV: 9.5 fL (ref 7.5–12.5)
Monocytes Relative: 11.7 %
Neutro Abs: 1228 {cells}/uL — ABNORMAL LOW (ref 1500–7800)
Neutrophils Relative %: 34.1 %
Platelets: 282 Thousand/uL (ref 140–400)
RBC: 3.91 Million/uL (ref 3.80–5.10)
RDW: 12.7 % (ref 11.0–15.0)
Total Lymphocyte: 50.3 %
WBC: 3.6 Thousand/uL — ABNORMAL LOW (ref 3.8–10.8)

## 2024-05-29 LAB — VITAMIN B12: Vitamin B-12: 1045 pg/mL (ref 200–1100)

## 2024-05-29 LAB — LIPID PANEL
Cholesterol: 227 mg/dL — ABNORMAL HIGH (ref <200)
HDL: 97 mg/dL (ref >=50)
LDL Cholesterol (Calc): 114 mg/dL — ABNORMAL HIGH
Non-HDL Cholesterol (Calc): 130 mg/dL — ABNORMAL HIGH (ref <130)
Total CHOL/HDL Ratio: 2.3 (calc) (ref <5.0)
Triglycerides: 66 mg/dL (ref <150)

## 2024-05-29 LAB — VITAMIN D 25 HYDROXY (VIT D DEFICIENCY, FRACTURES): Vit D, 25-Hydroxy: 38 ng/mL (ref 30–100)

## 2024-05-29 LAB — TSH: TSH: 2.7 m[IU]/L (ref 0.40–4.50)

## 2024-06-04 ENCOUNTER — Telehealth: Payer: Self-pay

## 2024-06-04 NOTE — Telephone Encounter (Signed)
 Copied from CRM [id number]. Topic: Referral - Question >> Jun 04, 2024  3:45 PM Pinkey ORN wrote: Reason for CRM: Referral Question >> Jun 04, 2024  3:47 PM Pinkey ORN wrote: Patient's husband is wanting to know if Aletha Bene, MD could recommend patient over to a Neuro surgeon for a second opinion. Please follow up [phone]

## 2024-06-04 NOTE — Telephone Encounter (Signed)
 Copied from CRM #[id number]. Topic: General - Other >> Jun 04, 2024  3:48 PM Pinkey ORN wrote: Reason for CRM: Message For Aletha Bene, MD >> Jun 04, 2024  3:50 PM Pinkey ORN wrote: Patient states the medications busPIRone  (BUSPAR ) 5 MG tablet and pregabalin  (LYRICA ) 50 MG capsule aren't helping with her pain.SABRA

## 2024-06-05 NOTE — Telephone Encounter (Signed)
 Lvm for pt to return call

## 2024-06-05 NOTE — Telephone Encounter (Signed)
 LVM for pt to return call

## 2024-06-06 ENCOUNTER — Other Ambulatory Visit: Payer: Self-pay

## 2024-06-06 ENCOUNTER — Telehealth: Payer: Self-pay

## 2024-06-06 DIAGNOSIS — G8929 Other chronic pain: Secondary | ICD-10-CM

## 2024-06-06 NOTE — Telephone Encounter (Signed)
 Pt is requesting a new prescription: Prescription Request  06/06/2024  LOV: 05/28/24  What is the name of the medication or equipment? HYDROcodone -acetaminophen  (NORCO) 10-325 MG tablet [[id number]]   Have you contacted your pharmacy to request a refill? Yes   Which pharmacy would you like this sent to?  [facility] [place], [place] - [id number] [place] [address] Phone: [phone] Fax: [phone]    Patient notified that their request is being sent to the clinical staff for review and that they should receive a response within 2 business days.   Please advise at [facility] [phone]

## 2024-06-06 NOTE — Telephone Encounter (Signed)
 Addressed in separate encounter.

## 2024-06-06 NOTE — Telephone Encounter (Signed)
 Gave pt the # for pain management and also entered new referral for neurosurgery.

## 2024-06-06 NOTE — Telephone Encounter (Signed)
 Copied from CRM #[id number]. Topic: Referral - Status >> Jun 06, 2024 10:26 AM Amy B wrote: Reason for CRM: Patient's husband returned call from [facility].  He states they have not received a call back from pain management and they do want her to be referred to a different neurosurgeon for a second opinion.

## 2024-06-07 ENCOUNTER — Other Ambulatory Visit: Payer: Self-pay | Admitting: Family Medicine

## 2024-06-07 ENCOUNTER — Other Ambulatory Visit: Payer: Self-pay

## 2024-06-07 MED ORDER — HYDROCODONE-ACETAMINOPHEN 10-325 MG PO TABS: 1.0000 | ORAL_TABLET | Freq: Four times a day (QID) | ORAL | 0 refills | Status: DC | PRN

## 2024-06-07 NOTE — Telephone Encounter (Signed)
 Pt husband states that the Lyrica  does not help but the hydrocodone  calms down her pain.

## 2024-06-07 NOTE — Telephone Encounter (Signed)
 Copied from CRM #[id number]. Topic: Clinical - Medication Refill >> Jun 07, 2024 12:04 PM Amy B wrote: Medication: HYDROcodone -acetaminophen  (NORCO) 10-325 MG tablet  Has the patient contacted their pharmacy? Yes (Agent: If no, request that the patient contact the pharmacy for the refill. If patient does not wish to contact the pharmacy document the reason why and proceed with request.) (Agent: If yes, when and what did the pharmacy advise?)  This is the patient's preferred pharmacy:  [facility] [place], [place] - [id number] [place] [address] Phone: [phone] Fax: [phone]  Is this the correct pharmacy for this prescription? Yes If no, delete pharmacy and type the correct one.   Has the prescription been filled recently? No  Is the patient out of the medication? Yes  Has the patient been seen for an appointment in the last year OR does the patient have an upcoming appointment? Yes  Can we respond through MyChart? No  Agent: Please be advised that Rx refills may take up to 3 business days. We ask that you follow-up with your pharmacy.

## 2024-06-07 NOTE — Telephone Encounter (Signed)
 Spoke with pt husband. He verbalized understanding. Hydrocodone  pended for provider to sign.

## 2024-06-12 ENCOUNTER — Telehealth: Payer: Self-pay | Admitting: Family Medicine

## 2024-06-12 NOTE — Telephone Encounter (Signed)
 Referral placed for 2nd opinion. Pt husband informed.

## 2024-06-12 NOTE — Telephone Encounter (Signed)
 Copied from CRM [id number]. Topic: Referral - Question >> Jun 04, 2024  3:45 PM Pinkey ORN wrote: Reason for CRM: Referral Question  Patient's husband is wanting to know if Aletha Bene, MD could recommend patient over to a Neuro surgeon for a second opinion. Please follow up [phone]

## 2024-06-17 ENCOUNTER — Encounter: Payer: Self-pay | Admitting: Family Medicine

## 2024-06-17 ENCOUNTER — Ambulatory Visit (INDEPENDENT_AMBULATORY_CARE_PROVIDER_SITE_OTHER): Admitting: Family Medicine

## 2024-06-17 VITALS — BP 118/70 | HR 88 | Temp 98.5°F | Ht 67.0 in | Wt 151.0 lb

## 2024-06-17 DIAGNOSIS — Z23 Encounter for immunization: Secondary | ICD-10-CM

## 2024-06-17 DIAGNOSIS — M5416 Radiculopathy, lumbar region: Secondary | ICD-10-CM | POA: Diagnosis not present

## 2024-06-17 DIAGNOSIS — G8929 Other chronic pain: Secondary | ICD-10-CM

## 2024-06-17 MED ORDER — PREGABALIN 100 MG PO CAPS: 100.0000 mg | ORAL_CAPSULE | Freq: Two times a day (BID) | ORAL | 0 refills | Status: DC

## 2024-06-17 NOTE — Progress Notes (Signed)
 Patient Office Visit  Assessment & Plan:  Other chronic pain -     Pregabalin ; Take 1 capsule (100 mg total) by mouth 2 (two) times daily.  Dispense: 60 capsule; Refill: 0  Lumbar radiculopathy -     Pregabalin ; Take 1 capsule (100 mg total) by mouth 2 (two) times daily.  Dispense: 60 capsule; Refill: 0  Need for shingles vaccine -     Varicella-zoster vaccine IM   Assessment and Plan    Chronic lower back pain Chronic lower back pain, severe, partially relieved by hydrocodone . Pregabalin  50 mg twice daily ineffective. Awaiting neurosurgical consultation. - Increase pregabalin  to 100 mg twice daily. - Continue hydrocodone  as needed. - Await neurosurgical consultation, 2nd opinion  - Encourage water -based exercises at [facility].  Memory impairment Worsening memory impairment without medication-related cause. Concern about progression.  Hyperlipidemia Hyperlipidemia with slightly elevated cholesterol. Recently started on medication. Dietary habits include fried chicken twice a week.  Anxiety Anxiety well-managed. Previously prescribed Buspar  5 mg twice daily. Reports feeling less anxious.  General Health Maintenance Received flu shot. Interested in shingles vaccine if covered by insurance. - Check insurance coverage for Shingrix vaccine. - Administer Shingrix vaccine if covered.     Will increase the Lyrica  100 mg twice a day.  Patient is awaiting second opinion from neurosurgery.  Will follow-up after she sees a Midwife. Return if symptoms worsen or fail to improve.   Subjective:    Patient ID: [name], female    DOB: 08/31/1961  Age: 63 y.o. MRN: [id number]  Chief Complaint  Patient presents with   Medical Management of Chronic Issues    HPI Discussed the use of AI scribe software for clinical note transcription with the patient, who gave verbal consent to proceed.  History of Present Illness        History of Present Illness [name] is a 63 year old female  who presents with chronic lower back pain.  She experiences persistent lower back pain with a current intensity of 9 out of 10, which worsens with standing and is alleviated by sitting. The pain has been present for a long time  and is described as severe. She takes hydrocodone  twice daily, which provides pain relief for about four to five hours, reducing the pain level to 4 or 5 out of 10. However, she did not take it today, resulting in a pain level of 9 out of 10. Patient is more alert due to not taking her pain med.   She has been prescribed pregabalin  (Lyrica ) at a dose of 50 mg twice daily, but reports no significant improvement in her symptoms. She has previously tried gabapentin  without relief. She also started on cyclobenzaprine , a muscle relaxant, but its effectiveness is unclear. She reports feeling 'like a zombie' when taking hydrocodone  but does give her some relief.   Her sleep is reportedly good, averaging about eight hours per night, and the pain does not wake her up. No constipation, stating she is regular, going every other day. She reports some memory issues, feeling that 'something ain't right', and believes it is worsening, though she does not attribute this to her medication.  She has a history of anxiety and was prescribed Buspar  (buspirone ) at 5 mg twice daily, but she is unsure if she is currently taking it and does not feel anxious. She has gained some weight, which she attributes to decreased physical activity due to pain and a diet that includes fried chicken twice a week.  She has received a flu shot and her labs from the last visit showed good B12, vitamin D , and thyroid  levels, but slightly elevated cholesterol. She has recently started cholesterol medication.  Results LABS Cholesterol: elevated  Assessment and Plan Chronic lower back pain Chronic lower back pain, severe, partially relieved by hydrocodone . Pregabalin  50 mg twice daily ineffective. Awaiting  neurosurgical consultation. - Increase pregabalin  to 100 mg twice daily. - Continue hydrocodone  as needed. - Await neurosurgical consultation, 2nd opinion  - Encourage water -based exercises at [facility].  Memory impairment Worsening memory impairment without medication-related cause. Concern about progression.  Will order neurology consult in the near future if necessary.  Discussed that hydrocodone  is likely contributing to this.  Hyperlipidemia Hyperlipidemia with slightly elevated cholesterol. Recently started on medication. Dietary habits include fried chicken twice a week.  Anxiety Anxiety well-managed. Previously prescribed Buspar  5 mg twice daily. Reports feeling less anxious.  General Health Maintenance Received flu shot. Interested in shingles vaccine if covered by insurance. - Check insurance coverage for Shingrix vaccine. - Administer Shingrix vaccine if covered.   The 10-year ASCVD risk score (Arnett DK, et al., 2019) is: 5.7%  Past Medical History:  Diagnosis Date   Anemia    Arthritis    Breast cancer (HCC)    right breast cancer 5 yrs ago   Congenital malrotation of intestine (HCC) 07/2018   APPENDIX IN LUQ   Depression    Dysrhythmia    hx palpitations-too much caffiene   Headache    patient denies this dx   Scoliosis    Past Surgical History:  Procedure Laterality Date   ABDOMINAL EXPOSURE N/A 08/25/2022   Procedure: ABDOMINAL EXPOSURE;  Surgeon: Gretta Lonni PARAS, MD;  Location: [facility] OR;  Service: Vascular;  Laterality: N/A;   ANKLE SURGERY     left ankle cyst   ANTERIOR LUMBAR FUSION N/A 08/25/2022   Procedure: ANTERIOR LUMBAR FOUR TO FIVE INTERBODY FUSION, ILIAC CRST BONE GRAFT HARVEST;  Surgeon: Burnetta Aures, MD;  Location: [facility] OR;  Service: Orthopedics;  Laterality: N/A;  4 HRS DR. CLARK TO DO APPROACH 3 C-BED   BACK SURGERY  2000   lumbar disc surgery   BIOPSY  01/07/2020   Procedure: BIOPSY;  Surgeon: Harvey Margo CROME, MD;  Location: [facility];  Service: Endoscopy;;   BIOPSY  02/07/2023   Procedure: BIOPSY;  Surgeon: Cindie Carlin POUR, DO;  Location: [facility];  Service: Endoscopy;;   BREAST EXCISIONAL BIOPSY Left    benign   BREAST SURGERY Left    left partail mastectomy-cyst   CERVICAL DISC SURGERY  1999   anterior and a posterier cerv fusionx2   CESAREAN SECTION     x2   COLONOSCOPY     COLONOSCOPY WITH PROPOFOL  N/A 06/26/2018   Procedure: COLONOSCOPY WITH PROPOFOL ;  Surgeon: Harvey Margo CROME, MD;  Location: [facility];  Service: Endoscopy;  Laterality: N/A;  11:00am   COLONOSCOPY WITH PROPOFOL  N/A 02/07/2023   Procedure: COLONOSCOPY WITH PROPOFOL ;  Surgeon: Cindie Carlin POUR, DO;  Location: [facility];  Service: Endoscopy;  Laterality: N/A;  115pm, asa 2   DILATION AND CURETTAGE OF UTERUS     DILITATION & CURRETTAGE/HYSTROSCOPY WITH THERMACHOICE ABLATION  07/10/2012   Procedure: DILATATION & CURETTAGE/HYSTEROSCOPY WITH THERMACHOICE ABLATION;  Surgeon: Norleen LULLA Server, MD;  Location: [facility] ORS;  Service: Gynecology;  Laterality: N/A;  D5 13ml in, 13 ml out, temp 87degree celcius, total therapy time 16sec   ERCP  ESOPHAGOGASTRODUODENOSCOPY (EGD) WITH PROPOFOL  N/A 01/07/2020   Procedure: ESOPHAGOGASTRODUODENOSCOPY (EGD) WITH PROPOFOL ;  Surgeon: Harvey Margo CROME, MD;  [facility];  Service: Endoscopy;  Laterality: N/A;  2:00pm   ESOPHAGOGASTRODUODENOSCOPY (EGD) WITH PROPOFOL  N/A 02/07/2023   Procedure: ESOPHAGOGASTRODUODENOSCOPY (EGD) WITH PROPOFOL ;  Surgeon: Cindie Carlin POUR, DO;  [facility];  Service: Endoscopy;  Laterality: N/A;   EYE SURGERY     HARVEST BONE GRAFT N/A 08/25/2022   Procedure: HARVEST ILIAC BONE GRAFT;  Surgeon: Burnetta Aures, MD;  Location: [facility] OR;  Service: Orthopedics;  Laterality: N/A;   POLYPECTOMY  06/26/2018   Procedure: POLYPECTOMY;  Surgeon: Harvey Margo CROME, MD;  Location: [facility];  Service: Endoscopy;;  colon   TUBAL LIGATION     with last c-section    Social History   Tobacco Use   Smoking status: Never   Smokeless tobacco: Never  Vaping Use   Vaping status: Never Used  Substance Use Topics   Alcohol use: No   Drug use: No   Family History  Problem Relation Age of Onset   Hypertension Mother    Colon cancer Neg Hx    Gastric cancer Neg Hx    Esophageal cancer Neg Hx    No Known Allergies  ROS    Objective:    BP 118/70   Pulse 88   Temp 98.5 F (36.9 C)   Ht 5' 7 (1.702 m)   Wt 151 lb (68.5 kg)   LMP 12/06/2013   SpO2 98%   BMI 23.65 kg/m  BP Readings from Last 3 Encounters:  06/17/24 118/70  05/28/24 (!) 130/90  03/30/24 107/72   Wt Readings from Last 3 Encounters:  06/17/24 151 lb (68.5 kg)  05/28/24 147 lb 8 oz (66.9 kg)  03/11/24 145 lb 6.4 oz (66 kg)    Physical Exam Vitals and nursing note reviewed.  Constitutional:      General: She is not in acute distress.    Appearance: Normal appearance.     Comments: Comes in with her husband.  Patient appears more alert this a.m.  HENT:     Head: Normocephalic.     Right Ear: Tympanic membrane, ear canal and external ear normal.     Left Ear: Tympanic membrane, ear canal and external ear normal.  Eyes:     Extraocular Movements: Extraocular movements intact.     Conjunctiva/sclera: Conjunctivae normal.     Pupils: Pupils are equal, round, and reactive to light.  Cardiovascular:     Rate and Rhythm: Normal rate and regular rhythm.     Heart sounds: Normal heart sounds.  Pulmonary:     Effort: Pulmonary effort is normal.     Breath sounds: Normal breath sounds.  Musculoskeletal:     Lumbar back: Tenderness and bony tenderness present.     Right lower leg: No edema.     Left lower leg: No edema.     Comments: Patient is sitting in chair   Neurological:     General: No focal deficit present.     Mental Status: She is alert and oriented to person, place, and time.  Psychiatric:        Mood and Affect: Mood normal.        Behavior: Behavior  normal.      No results found for any visits on 06/17/24.

## 2024-06-18 ENCOUNTER — Telehealth: Payer: Self-pay | Admitting: Family Medicine

## 2024-06-18 NOTE — Telephone Encounter (Signed)
 Copied from CRM [id number]. Topic: Referral - Question >> Jun 04, 2024  3:45 PM Pinkey ORN wrote: Reason for CRM: Referral Question  Patient's husband is wanting to know if Aletha Bene, MD could recommend patient over to a Neuro surgeon for a second opinion. Please follow up [phone]

## 2024-06-19 NOTE — Telephone Encounter (Signed)
 Referral for neurosurgery has been sent and awaiting approval.

## 2024-06-21 ENCOUNTER — Ambulatory Visit: Payer: Self-pay | Admitting: Family Medicine

## 2024-06-21 NOTE — Telephone Encounter (Signed)
 FYI Only or Action Required?: Action required by provider: medication refill request.  Patient was last seen in primary care on 06/17/2024 by Aletha Bene, MD.  Called Nurse Triage reporting Back Pain.  Symptoms began Ongoing.  Interventions attempted: Prescription medications: Oxycodone .  Symptoms are: unchanged.  Triage Disposition: Call PCP When Office is Open  Patient/caregiver understands and will follow disposition?: No, wishes to speak with PCP  **Patient seeking Oxycodone  refill now, and wants a stronger dosage**                      Copied from CRM #[id number]. Topic: Clinical - Red Word Triage >> Jun 21, 2024  2:16 PM Selinda RAMAN wrote: Garen the spouse of the patient called in originally asking for a refill on his wife's pain meds. After talking with him she is out of her pain meds and in extreme pain. He says she has severe back pain and is waiting to see a neurosurgeon. I will transfer him to [facility] Reason for Disposition  Caller requesting a CONTROLLED substance prescription refill (e.g., narcotics, ADHD medicines)  Answer Assessment - Initial Assessment Questions 1. ONSET: When did the pain begin? (e.g., minutes, hours, days)       2. LOCATION: Where does it hurt? (upper, mid or lower back)       3. SEVERITY: How bad is the pain?  (e.g., Scale 1-10; mild, moderate, or severe)       4. PATTERN: Is the pain constant? (e.g., yes, no; constant, intermittent)        5. RADIATION: Does the pain shoot into your legs or somewhere else?       6. CAUSE:  What do you think is causing the back pain?        7. BACK OVERUSE:  Any recent lifting of heavy objects, strenuous work or exercise?       8. MEDICINES: What have you taken so far for the pain? (e.g., nothing, acetaminophen , NSAIDS)       9. NEUROLOGIC SYMPTOMS: Do you have any weakness, numbness, or problems with bowel/bladder control?       10. OTHER SYMPTOMS: Do you  have any other symptoms? (e.g., fever, abdomen pain, burning with urination, blood in urine)  Answer Assessment - Initial Assessment Questions 1. REASON FOR CALL: What is the main reason for your call? or How can I best help you?   Patient is seeking refill on Oxycodone  prescription, was advised the refill request takes up to 3 days to process. They are wanting the medication filled now, as she is in pain. She is also requesting a stronger dose of the Oxycodone  as well.  Protocols used: Back Pain-A-AH, Information Only Call - No Triage-A-AH, Medication Refill and Renewal Call-A-AH

## 2024-06-24 ENCOUNTER — Encounter: Payer: Self-pay | Admitting: Physical Medicine and Rehabilitation

## 2024-06-24 NOTE — Telephone Encounter (Signed)
 Pt and pt husband informed to go to [facility] since pt is in severe pain. I informed both that Dr Aletha is out of the office and unable to send in medication.

## 2024-06-26 ENCOUNTER — Ambulatory Visit
Admission: EM | Admit: 2024-06-26 | Discharge: 2024-06-26 | Disposition: A | Discharge status: Home / Self Care | Attending: Family Medicine | Admitting: Family Medicine

## 2024-06-26 DIAGNOSIS — M5412 Radiculopathy, cervical region: Secondary | ICD-10-CM | POA: Diagnosis not present

## 2024-06-26 MED ORDER — TIZANIDINE HCL 4 MG PO CAPS
4.0000 mg | ORAL_CAPSULE | Freq: Three times a day (TID) | ORAL | 0 refills | Status: DC | PRN
Start: 2024-06-26 — End: 2024-07-05

## 2024-06-26 MED ORDER — DEXAMETHASONE SOD PHOSPHATE PF 10 MG/ML IJ SOLN
10.0000 mg | Freq: Once | INTRAMUSCULAR | Status: AC
  Administered 2024-06-26: 10 mg via INTRAMUSCULAR

## 2024-06-26 NOTE — ED Triage Notes (Signed)
 Pt reports severe pain in the right arm from the neck down through the arm. x 1 week, denies injury, feels like a sharp stinging pain

## 2024-06-26 NOTE — ED Provider Notes (Signed)
 [facility]    CSN: [id number] Arrival date & time: 06/26/24  9176      History   Chief Complaint No chief complaint on file.   HPI [name] is a 64 y.o. female.   Patient presenting today with about 1 week history of right sided neck stiffness soreness and localized pain now with pain radiating down the right arm particular with movement.  Denies weakness, numbness, tingling, swelling, discoloration, known injury, headache, dizziness, chest pain, shortness of breath, palpitations.  So far not trying anything over-the-counter for symptoms.  Does have a history of this degeneration in the cervical region status postsurgical repair.    Past Medical History:  Diagnosis Date   Anemia    Arthritis    Breast cancer (HCC)    right breast cancer 5 yrs ago   Congenital malrotation of intestine (HCC) 07/2018   APPENDIX IN LUQ   Depression    Dysrhythmia    hx palpitations-too much caffiene   Headache    patient denies this dx   Scoliosis     Patient Active Problem List   Diagnosis Date Noted   History of radiation therapy 05/28/2024   Word finding difficulty 05/28/2024   IBS (irritable colon syndrome) 07/03/2023   Lumbar post-laminectomy syndrome 02/02/2023   GERD (gastroesophageal reflux disease) 01/09/2023   Rectal bleeding 01/09/2023   H/O adenomatous polyp of colon 01/09/2023   S/P lumbar fusion 08/25/2022   Low back pain 08/02/2022   Stenosis of lateral recess of lumbar spine 08/02/2022   Pill dysphagia 04/19/2022   Women's annual routine gynecological examination 06/21/2021   Lumbar radiculopathy 06/11/2021   Lumbar pain 04/19/2021   Mass of upper outer quadrant of left breast 04/13/2021   Encounter for screening fecal occult blood testing 06/18/2020   Encounter for gynecological examination with Papanicolaou smear of cervix 06/18/2020   Mass of wrist 03/23/2020   Encounter for orthopedic follow-up care 03/17/2020   NSAID induced gastritis    Anemia  12/20/2019   Dyspepsia 10/23/2019   Simple bone cyst 11/23/2018   Constipation 04/16/2018   Encounter for screening colonoscopy 04/16/2018   Displacement of cervical intervertebral disc without myelopathy 02/20/2017   Cancer of breast, intraductal, right 12/21/2016   Pap smear, as part of routine gynecological examination 12/21/2016   Anxiety 12/09/2015   Muscular aches 01/05/2015   Reactive depression 01/05/2015   Perimenopausal and postmenopausal vasomotor symptoms 10/09/2014   Hot flashes due to tamoxifen 07/28/2014   Memory loss 07/28/2014   Vision changes 07/28/2014   Encounter for routine gynecological examination 11/21/2013   Cancer of upper-inner quadrant of female breast (HCC) 10/11/2013   Breast cancer (HCC) 09/04/2013   Cervical spondylosis without myelopathy 04/03/2013   Pain in elbow 04/03/2013    Past Surgical History:  Procedure Laterality Date   ABDOMINAL EXPOSURE N/A 08/25/2022   Procedure: ABDOMINAL EXPOSURE;  Surgeon: Gretta Lonni PARAS, MD;  Location: [facility] OR;  Service: Vascular;  Laterality: N/A;   ANKLE SURGERY     left ankle cyst   ANTERIOR LUMBAR FUSION N/A 08/25/2022   Procedure: ANTERIOR LUMBAR FOUR TO FIVE INTERBODY FUSION, ILIAC CRST BONE GRAFT HARVEST;  Surgeon: Burnetta Aures, MD;  Location: [facility] OR;  Service: Orthopedics;  Laterality: N/A;  4 HRS DR. CLARK TO DO APPROACH 3 C-BED   BACK SURGERY  2000   lumbar disc surgery   BIOPSY  01/07/2020   Procedure: BIOPSY;  Surgeon: Harvey Margo CROME, MD;  Location: [facility];  Service:  Endoscopy;;   BIOPSY  02/07/2023   Procedure: BIOPSY;  Surgeon: Cindie Carlin POUR, DO;  Location: [facility];  Service: Endoscopy;;   BREAST EXCISIONAL BIOPSY Left    benign   BREAST SURGERY Left    left partail mastectomy-cyst   CERVICAL DISC SURGERY  1999   anterior and a posterier cerv fusionx2   CESAREAN SECTION     x2   COLONOSCOPY     COLONOSCOPY WITH PROPOFOL  N/A 06/26/2018   Procedure: COLONOSCOPY WITH  PROPOFOL ;  Surgeon: Harvey Margo CROME, MD;  Location: [facility];  Service: Endoscopy;  Laterality: N/A;  11:00am   COLONOSCOPY WITH PROPOFOL  N/A 02/07/2023   Procedure: COLONOSCOPY WITH PROPOFOL ;  Surgeon: Cindie Carlin POUR, DO;  Location: [facility];  Service: Endoscopy;  Laterality: N/A;  115pm, asa 2   DILATION AND CURETTAGE OF UTERUS     DILITATION & CURRETTAGE/HYSTROSCOPY WITH THERMACHOICE ABLATION  07/10/2012   Procedure: DILATATION & CURETTAGE/HYSTEROSCOPY WITH THERMACHOICE ABLATION;  Surgeon: Norleen LULLA Server, MD;  Location: [facility];  Service: Gynecology;  Laterality: N/A;  D5 13ml in, 13 ml out, temp 87degree celcius, total therapy time 16sec   ERCP     ESOPHAGOGASTRODUODENOSCOPY (EGD) WITH PROPOFOL  N/A 01/07/2020   Procedure: ESOPHAGOGASTRODUODENOSCOPY (EGD) WITH PROPOFOL ;  Surgeon: Harvey Margo CROME, MD;  [facility];  Service: Endoscopy;  Laterality: N/A;  2:00pm   ESOPHAGOGASTRODUODENOSCOPY (EGD) WITH PROPOFOL  N/A 02/07/2023   Procedure: ESOPHAGOGASTRODUODENOSCOPY (EGD) WITH PROPOFOL ;  Surgeon: Cindie Carlin POUR, DO;  [facility];  Service: Endoscopy;  Laterality: N/A;   EYE SURGERY     HARVEST BONE GRAFT N/A 08/25/2022   Procedure: HARVEST ILIAC BONE GRAFT;  Surgeon: Burnetta Aures, MD;  Location: [facility] OR;  Service: Orthopedics;  Laterality: N/A;   POLYPECTOMY  06/26/2018   Procedure: POLYPECTOMY;  Surgeon: Harvey Margo CROME, MD;  Location: [facility];  Service: Endoscopy;;  colon   TUBAL LIGATION     with last c-section    OB History     Gravida  4   Para  2   Term      Preterm      AB      Living  2      SAB      IAB      Ectopic      Multiple      Live Births  2            Home Medications    Prior to Admission medications   Medication Sig Start Date End Date Taking? Authorizing Provider  tiZANidine  (ZANAFLEX ) 4 MG capsule Take 1 capsule (4 mg total) by mouth 3 (three) times daily as needed for muscle spasms. Do not  drink alcohol or drive while taking this medication.  May cause drowsiness. 06/26/24  Yes Stuart Vernell Norris, PA-C  ALPRAZolam  (XANAX ) 0.5 MG tablet Take 0.5 mg by mouth 3 (three) times daily as needed for anxiety.    [provider]  bethanechol  (URECHOLINE ) 5 MG tablet Take 5 mg by mouth 3 (three) times daily.    [provider]  busPIRone  (BUSPAR ) 5 MG tablet Take 1 tablet (5 mg total) by mouth 2 (two) times daily. 05/28/24   Aletha Bene, MD  cetirizine  (ZYRTEC ) 10 MG tablet Take 1 tablet (10 mg total) by mouth daily. Patient not taking: Reported on 06/17/2024 12/02/23   Leath-Warren, Etta PARAS, NP  cholecalciferol (GNP VITAMIN D3) 10 MCG (400 UNIT) TABS tablet  Take 400 Units by mouth in the morning.    [provider]  cyclobenzaprine  (FLEXERIL ) 10 MG tablet Take 1 tablet (10 mg total) by mouth 3 (three) times daily as needed for muscle spasms. 05/28/24   Aletha Bene, MD  escitalopram  (LEXAPRO ) 20 MG tablet Take 20 mg by mouth daily as needed. Patient not taking: Reported on 06/17/2024    [provider]  fluticasone  (FLONASE ) 50 MCG/ACT nasal spray Place 2 sprays into both nostrils daily. Patient not taking: Reported on 06/17/2024 12/02/23   Leath-Warren, Etta PARAS, NP  HYDROcodone -acetaminophen  (NORCO) 10-325 MG tablet Take 1 tablet by mouth every 6 (six) hours as needed. 06/07/24   Aletha Bene, MD  iron polysaccharides (NIFEREX) 150 MG capsule Take 150 mg by mouth daily as needed (low iron).    [provider]  predniSONE  (DELTASONE ) 20 MG tablet Take 2 tablets (40 mg total) by mouth daily with breakfast. Patient not taking: Reported on 06/17/2024 03/30/24   Stuart Vernell Norris, PA-C  pregabalin  (LYRICA ) 100 MG capsule Take 1 capsule (100 mg total) by mouth 2 (two) times daily. 06/17/24   Aletha Bene, MD  pregabalin  (LYRICA ) 50 MG capsule Take 1 capsule (50 mg total) by mouth 2 (two) times daily. Patient not taking: Reported on  06/17/2024 05/28/24   Aguiar, Rafaela, MD  rosuvastatin (CRESTOR) 10 MG tablet Take 10 mg by mouth daily. 04/22/24   [provider]  tiZANidine  (ZANAFLEX ) 4 MG capsule Take 1 capsule (4 mg total) by mouth 3 (three) times daily as needed for muscle spasms. Do not drink alcohol or drive while taking this medication.  May cause drowsiness. Patient not taking: Reported on 06/17/2024 03/30/24   Stuart Vernell Norris, PA-C    Family History Family History  Problem Relation Age of Onset   Hypertension Mother    Colon cancer Neg Hx    Gastric cancer Neg Hx    Esophageal cancer Neg Hx     Social History Social History   Tobacco Use   Smoking status: Never   Smokeless tobacco: Never  Vaping Use   Vaping status: Never Used  Substance Use Topics   Alcohol use: No   Drug use: No     Allergies   Patient has no known allergies.   Review of Systems Review of Systems Per HPI  Physical Exam Triage Vital Signs ED Triage Vitals  Encounter Vitals Group     BP 06/26/24 0854 128/85     Girls Systolic BP Percentile --      Girls Diastolic BP Percentile --      Boys Systolic BP Percentile --      Boys Diastolic BP Percentile --      Pulse Rate 06/26/24 0854 94     Resp 06/26/24 0854 20     Temp 06/26/24 0854 98.3 F (36.8 C)     Temp Source 06/26/24 0854 Oral     SpO2 06/26/24 0854 98 %     Weight --      Height --      Head Circumference --      Peak Flow --      Pain Score 06/26/24 0859 10     Pain Loc --      Pain Education --      Exclude from Growth Chart --    No data found.  Updated Vital Signs BP 128/85 (BP Location: Right Arm)   Pulse 94   Temp 98.3 F (36.8 C) (Oral)  Resp 20   LMP 12/06/2013   SpO2 98%   Visual Acuity Right Eye Distance:   Left Eye Distance:   Bilateral Distance:    Right Eye Near:   Left Eye Near:    Bilateral Near:     Physical Exam Vitals and nursing note reviewed.  Constitutional:      Appearance: Normal appearance.  She is not ill-appearing.  HENT:     Head: Atraumatic.  Eyes:     Extraocular Movements: Extraocular movements intact.     Conjunctiva/sclera: Conjunctivae normal.  Cardiovascular:     Rate and Rhythm: Normal rate.  Pulmonary:     Effort: Pulmonary effort is normal.  Musculoskeletal:        General: Tenderness present. Normal range of motion.     Cervical back: Normal range of motion and neck supple.     Comments: Localized area of tenderness to palpation to the right lateral neck musculature down toward base of neck causing shooting pains down the right arm.  Range of motion to the right upper extremity intact, no midline spinal tenderness to palpation diffusely.  Grip strength when able bilateral hands  Skin:    General: Skin is warm and dry.     Findings: No bruising or erythema.  Neurological:     Mental Status: She is alert and oriented to person, place, and time.     Comments: Right upper extremity neurovascularly intact  Psychiatric:        Mood and Affect: Mood normal.        Thought Content: Thought content normal.        Judgment: Judgment normal.      UC Treatments / Results  Labs (all labs ordered are listed, but only abnormal results are displayed) Labs Reviewed - No data to display  EKG   Radiology No results found.  Procedures Procedures (including critical care time)  Medications Ordered in UC Medications  dexamethasone  (DECADRON ) injection 10 mg (10 mg Intramuscular Given 06/26/24 0937)    Initial Impression / Assessment and Plan / UC Course  I have reviewed the triage vital signs and the nursing notes.  Pertinent labs & imaging results that were available during my care of the patient were reviewed by me and considered in my medical decision making (see chart for details).     Suspect cervical radiculopathy, treat with IM Decadron , Zanaflex , heat, soft, stretches, rest.  Return for worsening symptoms.  Final Clinical Impressions(s) / UC  Diagnoses   Final diagnoses:  Cervical radiculopathy   Discharge Instructions   None    ED Prescriptions     Medication Sig Dispense Auth. Provider   tiZANidine  (ZANAFLEX ) 4 MG capsule Take 1 capsule (4 mg total) by mouth 3 (three) times daily as needed for muscle spasms. Do not drink alcohol or drive while taking this medication.  May cause drowsiness. 15 capsule Stuart Vernell Norris, [place]      PDMP not reviewed this encounter.   Stuart Vernell Junior, [place] 06/26/24 [phone]

## 2024-07-01 ENCOUNTER — Telehealth: Payer: Self-pay

## 2024-07-01 ENCOUNTER — Ambulatory Visit (INDEPENDENT_AMBULATORY_CARE_PROVIDER_SITE_OTHER)

## 2024-07-01 ENCOUNTER — Other Ambulatory Visit: Payer: Self-pay | Admitting: Family Medicine

## 2024-07-01 ENCOUNTER — Ambulatory Visit: Payer: Self-pay

## 2024-07-01 ENCOUNTER — Ambulatory Visit
Admission: EM | Admit: 2024-07-01 | Discharge: 2024-07-01 | Disposition: A | Discharge status: Home / Self Care | Attending: Family Medicine | Admitting: Family Medicine

## 2024-07-01 DIAGNOSIS — M25511 Pain in right shoulder: Secondary | ICD-10-CM

## 2024-07-01 MED ORDER — DEXAMETHASONE SOD PHOSPHATE PF 10 MG/ML IJ SOLN
10.0000 mg | Freq: Once | INTRAMUSCULAR | Status: AC
  Administered 2024-07-01: 10 mg via INTRAMUSCULAR

## 2024-07-01 NOTE — Discharge Instructions (Addendum)
 Your x-ray did not show any evidence of bony injury.  We have tried 1 more round of steroid and I recommend following up with orthopedics if this is not resolved.  Continue heat, massage, stretches, gentle range of motion exercises

## 2024-07-01 NOTE — Telephone Encounter (Signed)
 Pt called to tell doctor she had UTI s/s and forgot to inform provider of these s/s. This RN informed pt that a new visit would need to created for provider to properly assess patient and patient s/s. Pt verbalized understanding and is agreeable to plan.

## 2024-07-01 NOTE — Telephone Encounter (Signed)
 FYI Only or Action Required?: Action required by provider: medication refill request. Norco.   Patient was last seen in primary care on 06/17/2024 by Aletha Bene, MD.  Called Nurse Triage reporting Shoulder Pain.  Symptoms began several weeks ago.  Interventions attempted: Prescription medications: was seen at urgent care, states the shots they give her are not enough.  States she usually takes 1 norco in AM and 1 norco in PM and that controls her pain  Symptoms are: gradually worsening.  Triage Disposition: See Physician Within 24 Hours  Patient/caregiver understands and will follow disposition?: No, wishes to speak with PCP  Copied from CRM #[id number]. Topic: Clinical - Red Word Triage >> Jul 01, 2024  1:49 PM Berwyn MATSU wrote: Red Word that prompted transfer to Nurse Triage: pain in shoulder and going down arm. Reason for Disposition  [1] Unable to use arm at all AND [2] because of shoulder pain or stiffness  Answer Assessment - Initial Assessment Questions 1. ONSET: When did the pain start?     2.5 weeks 2. LOCATION: Where is the pain located?     Shoulder pain, R  3. PAIN: How bad is the pain? (Scale 1-10; or mild, moderate, severe)     10 4. WORK OR EXERCISE: Has there been any recent work or exercise that involved this part of the body?     Denies, dx with arthritis.  5. CAUSE: What do you think is causing the shoulder pain?     arthritis 6. OTHER SYMPTOMS: Do you have any other symptoms? (e.g., neck pain, swelling, rash, fever, numbness, weakness)     Denies SOB, denies N/V, denies dizziness.   Pt states she was seen in UC for this and dx with arthritis. Pt requesting pain medication. Pt has had narcotic pain medication for this same shoulder pain in the past.  Protocols used: Shoulder Pain-A-AH

## 2024-07-01 NOTE — ED Triage Notes (Signed)
 Pt reports being seen in UC for R arm from neck throughout arm for 2 weeks. Pt denies known injury. Pt reports being seen here for same. Pt reports taking medication with no relief. Pulse and sensation intact.

## 2024-07-01 NOTE — Telephone Encounter (Signed)
 Copied from CRM [id number]. Topic: Clinical - Medication Refill >> Jul 01, 2024  1:44 PM Berwyn MATSU wrote: Medication: HYDROcodone -acetaminophen  (NORCO) 10-325 MG tablet   Has the patient contacted their pharmacy? Yes (Agent: If no, request that the patient contact the pharmacy for the refill. If patient does not wish to contact the pharmacy document the reason why and proceed with request.) (Agent: If yes, when and what did the pharmacy advise?)  This is the patient's preferred pharmacy:  [facility] [place], [place] - [id number] [place] [address] Phone: [phone] Fax: [phone]  Is this the correct pharmacy for this prescription? Yes If no, delete pharmacy and type the correct one.   Has the prescription been filled recently? Yes  Is the patient out of the medication? No  Has the patient been seen for an appointment in the last year OR does the patient have an upcoming appointment? Yes  Can we respond through MyChart? No  Agent: Please be advised that Rx refills may take up to 3 business days. We ask that you follow-up with your pharmacy.

## 2024-07-01 NOTE — Telephone Encounter (Signed)
 Requested medication (s) are due for refill today: yes  Requested medication (s) are on the active medication list: yes  Last refill:  06/07/24  Future visit scheduled: no  Notes to clinic:  Unable to refill per protocol, cannot delegate.      Requested Prescriptions  Pending Prescriptions Disp Refills   HYDROcodone -acetaminophen  (NORCO) 10-325 MG tablet 30 tablet 0    Sig: Take 1 tablet by mouth every 6 (six) hours as needed.     Not Delegated - Analgesics:  Opioid Agonist Combinations Failed - 07/01/2024  2:35 PM      Failed - This refill cannot be delegated      Failed - Urine Drug Screen completed in last 360 days      Passed - Valid encounter within last 3 months    Recent Outpatient Visits           2 weeks ago Other chronic pain   [place] [facility] Aletha Bene, MD   1 month ago Encounter to establish care   [place] [facility] Aletha Bene, MD       Future Appointments             In 1 month Raulkar, Sven SQUIBB, MD [facility], [facility]

## 2024-07-01 NOTE — Telephone Encounter (Signed)
 Prescription Request  07/01/2024  LOV: 06/17/24  What is the name of the medication or equipment? HYDROcodone -acetaminophen  (NORCO) 10-325 MG tablet [[id number]]   Have you contacted your pharmacy to request a refill? Yes   Which pharmacy would you like this sent to?  [facility] [place], [place] - [id number] [place] [address] Phone: [phone] Fax: [phone]    Patient notified that their request is being sent to the clinical staff for review and that they should receive a response within 2 business days.   Please advise at [facility] [phone]

## 2024-07-01 NOTE — ED Provider Notes (Signed)
 [facility]    CSN: [id number] Arrival date & time: 07/01/24  9160      History   Chief Complaint Chief Complaint  Patient presents with   Shoulder Pain    HPI [name] is a 63 y.o. female.   Presenting today with ongoing right shoulder pain with pain radiating down the right arm for the past 2 weeks.  Was seen 1 week ago and given a steroid shot and muscle relaxers for suspected cervical radiculopathy but denies any improvement in symptoms.  Denies swelling, discoloration, numbness, tingling, injury to the area, headache, decreased range of motion.  Not tried anything over-the-counter for relief.    Past Medical History:  Diagnosis Date   Anemia    Arthritis    Breast cancer (HCC)    right breast cancer 5 yrs ago   Congenital malrotation of intestine (HCC) 07/2018   APPENDIX IN LUQ   Depression    Dysrhythmia    hx palpitations-too much caffiene   Headache    patient denies this dx   Scoliosis     Patient Active Problem List   Diagnosis Date Noted   History of radiation therapy 05/28/2024   Word finding difficulty 05/28/2024   IBS (irritable colon syndrome) 07/03/2023   Lumbar post-laminectomy syndrome 02/02/2023   GERD (gastroesophageal reflux disease) 01/09/2023   Rectal bleeding 01/09/2023   H/O adenomatous polyp of colon 01/09/2023   S/P lumbar fusion 08/25/2022   Low back pain 08/02/2022   Stenosis of lateral recess of lumbar spine 08/02/2022   Pill dysphagia 04/19/2022   Women's annual routine gynecological examination 06/21/2021   Lumbar radiculopathy 06/11/2021   Lumbar pain 04/19/2021   Mass of upper outer quadrant of left breast 04/13/2021   Encounter for screening fecal occult blood testing 06/18/2020   Encounter for gynecological examination with Papanicolaou smear of cervix 06/18/2020   Mass of wrist 03/23/2020   Encounter for orthopedic follow-up care 03/17/2020   NSAID induced gastritis    Anemia 12/20/2019   Dyspepsia  10/23/2019   Simple bone cyst 11/23/2018   Constipation 04/16/2018   Encounter for screening colonoscopy 04/16/2018   Displacement of cervical intervertebral disc without myelopathy 02/20/2017   Cancer of breast, intraductal, right 12/21/2016   Pap smear, as part of routine gynecological examination 12/21/2016   Anxiety 12/09/2015   Muscular aches 01/05/2015   Reactive depression 01/05/2015   Perimenopausal and postmenopausal vasomotor symptoms 10/09/2014   Hot flashes due to tamoxifen 07/28/2014   Memory loss 07/28/2014   Vision changes 07/28/2014   Encounter for routine gynecological examination 11/21/2013   Cancer of upper-inner quadrant of female breast (HCC) 10/11/2013   Breast cancer (HCC) 09/04/2013   Cervical spondylosis without myelopathy 04/03/2013   Pain in elbow 04/03/2013    Past Surgical History:  Procedure Laterality Date   ABDOMINAL EXPOSURE N/A 08/25/2022   Procedure: ABDOMINAL EXPOSURE;  Surgeon: Gretta Lonni PARAS, MD;  Location: [facility] OR;  Service: Vascular;  Laterality: N/A;   ANKLE SURGERY     left ankle cyst   ANTERIOR LUMBAR FUSION N/A 08/25/2022   Procedure: ANTERIOR LUMBAR FOUR TO FIVE INTERBODY FUSION, ILIAC CRST BONE GRAFT HARVEST;  Surgeon: Burnetta Aures, MD;  Location: [facility] OR;  Service: Orthopedics;  Laterality: N/A;  4 HRS DR. CLARK TO DO APPROACH 3 C-BED   BACK SURGERY  2000   lumbar disc surgery   BIOPSY  01/07/2020   Procedure: BIOPSY;  Surgeon: Harvey Margo CROME, MD;  Location: [facility];  Service: Endoscopy;;   BIOPSY  02/07/2023   Procedure: BIOPSY;  Surgeon: Cindie Carlin POUR, DO;  Location: [facility];  Service: Endoscopy;;   BREAST EXCISIONAL BIOPSY Left    benign   BREAST SURGERY Left    left partail mastectomy-cyst   CERVICAL DISC SURGERY  1999   anterior and a posterier cerv fusionx2   CESAREAN SECTION     x2   COLONOSCOPY     COLONOSCOPY WITH PROPOFOL  N/A 06/26/2018   Procedure: COLONOSCOPY WITH PROPOFOL ;  Surgeon: Harvey Margo CROME, MD;  Location: [facility];  Service: Endoscopy;  Laterality: N/A;  11:00am   COLONOSCOPY WITH PROPOFOL  N/A 02/07/2023   Procedure: COLONOSCOPY WITH PROPOFOL ;  Surgeon: Cindie Carlin POUR, DO;  Location: [facility];  Service: Endoscopy;  Laterality: N/A;  115pm, asa 2   DILATION AND CURETTAGE OF UTERUS     DILITATION & CURRETTAGE/HYSTROSCOPY WITH THERMACHOICE ABLATION  07/10/2012   Procedure: DILATATION & CURETTAGE/HYSTEROSCOPY WITH THERMACHOICE ABLATION;  Surgeon: Norleen LULLA Server, MD;  Location: [facility];  Service: Gynecology;  Laterality: N/A;  D5 13ml in, 13 ml out, temp 87degree celcius, total therapy time 16sec   ERCP     ESOPHAGOGASTRODUODENOSCOPY (EGD) WITH PROPOFOL  N/A 01/07/2020   Procedure: ESOPHAGOGASTRODUODENOSCOPY (EGD) WITH PROPOFOL ;  Surgeon: Harvey Margo CROME, MD;  [facility];  Service: Endoscopy;  Laterality: N/A;  2:00pm   ESOPHAGOGASTRODUODENOSCOPY (EGD) WITH PROPOFOL  N/A 02/07/2023   Procedure: ESOPHAGOGASTRODUODENOSCOPY (EGD) WITH PROPOFOL ;  Surgeon: Cindie Carlin POUR, DO;  [facility]: [facility];  Service: Endoscopy;  Laterality: N/A;   EYE SURGERY     HARVEST BONE GRAFT N/A 08/25/2022   Procedure: HARVEST ILIAC BONE GRAFT;  Surgeon: Burnetta Aures, MD;  Location: [facility] OR;  Service: Orthopedics;  Laterality: N/A;   POLYPECTOMY  06/26/2018   Procedure: POLYPECTOMY;  Surgeon: Harvey Margo CROME, MD;  Location: [facility];  Service: Endoscopy;;  colon   TUBAL LIGATION     with last c-section    OB History     Gravida  4   Para  2   Term      Preterm      AB      Living  2      SAB      IAB      Ectopic      Multiple      Live Births  2            Home Medications    Prior to Admission medications   Medication Sig Start Date End Date Taking? Authorizing Provider  ALPRAZolam  (XANAX ) 0.5 MG tablet Take 0.5 mg by mouth 3 (three) times daily as needed for anxiety.    [provider]  bethanechol  (URECHOLINE ) 5 MG  tablet Take 5 mg by mouth 3 (three) times daily. Patient not taking: Reported on 07/01/2024    [provider]  busPIRone  (BUSPAR ) 5 MG tablet Take 1 tablet (5 mg total) by mouth 2 (two) times daily. Patient not taking: Reported on 07/01/2024 05/28/24   Aletha Bene, MD  cetirizine  (ZYRTEC ) 10 MG tablet Take 1 tablet (10 mg total) by mouth daily. Patient not taking: Reported on 06/17/2024 12/02/23   Leath-Warren, Etta PARAS, NP  cholecalciferol (GNP VITAMIN D3) 10 MCG (400 UNIT) TABS tablet Take 400 Units by mouth in the morning. Patient not taking: Reported on 07/01/2024    [provider]  cyclobenzaprine  (FLEXERIL ) 10 MG tablet Take 1 tablet (10 mg  total) by mouth 3 (three) times daily as needed for muscle spasms. 05/28/24   Aletha Bene, MD  escitalopram  (LEXAPRO ) 20 MG tablet Take 20 mg by mouth daily as needed. Patient not taking: Reported on 06/17/2024    [provider]  fluticasone  (FLONASE ) 50 MCG/ACT nasal spray Place 2 sprays into both nostrils daily. Patient not taking: Reported on 06/17/2024 12/02/23   Leath-Warren, Etta PARAS, NP  HYDROcodone -acetaminophen  (NORCO) 10-325 MG tablet Take 1 tablet by mouth every 6 (six) hours as needed. Patient not taking: Reported on 07/01/2024 06/07/24   Aletha Bene, MD  iron polysaccharides (NIFEREX) 150 MG capsule Take 150 mg by mouth daily as needed (low iron).    [provider]  predniSONE  (DELTASONE ) 20 MG tablet Take 2 tablets (40 mg total) by mouth daily with breakfast. Patient not taking: Reported on 06/17/2024 03/30/24   Stuart Vernell Norris, PA-C  pregabalin  (LYRICA ) 100 MG capsule Take 1 capsule (100 mg total) by mouth 2 (two) times daily. 06/17/24   Aletha Bene, MD  pregabalin  (LYRICA ) 50 MG capsule Take 1 capsule (50 mg total) by mouth 2 (two) times daily. Patient not taking: Reported on 06/17/2024 05/28/24   Aguiar, Rafaela, MD  rosuvastatin (CRESTOR) 10 MG tablet Take 10 mg by mouth  daily. Patient not taking: Reported on 07/01/2024 04/22/24   [provider]  tiZANidine  (ZANAFLEX ) 4 MG capsule Take 1 capsule (4 mg total) by mouth 3 (three) times daily as needed for muscle spasms. Do not drink alcohol or drive while taking this medication.  May cause drowsiness. Patient not taking: Reported on 06/17/2024 03/30/24   Stuart Vernell Norris, PA-C  tiZANidine  (ZANAFLEX ) 4 MG capsule Take 1 capsule (4 mg total) by mouth 3 (three) times daily as needed for muscle spasms. Do not drink alcohol or drive while taking this medication.  May cause drowsiness. Patient not taking: Reported on 07/01/2024 06/26/24   Stuart Vernell Norris, PA-C    Family History Family History  Problem Relation Age of Onset   Hypertension Mother    Colon cancer Neg Hx    Gastric cancer Neg Hx    Esophageal cancer Neg Hx     Social History Social History   Tobacco Use   Smoking status: Never   Smokeless tobacco: Never  Vaping Use   Vaping status: Never Used  Substance Use Topics   Alcohol use: No   Drug use: No     Allergies   Patient has no known allergies.   Review of Systems Review of Systems PER HPI  Physical Exam Triage Vital Signs ED Triage Vitals  Encounter Vitals Group     BP 07/01/24 0924 110/78     Girls Systolic BP Percentile --      Girls Diastolic BP Percentile --      Boys Systolic BP Percentile --      Boys Diastolic BP Percentile --      Pulse Rate 07/01/24 0924 (!) 105     Resp 07/01/24 0924 20     Temp 07/01/24 0924 97.6 F (36.4 C)     Temp Source 07/01/24 0924 Oral     SpO2 07/01/24 0924 98 %     Weight --      Height --      Head Circumference --      Peak Flow --      Pain Score 07/01/24 0919 10     Pain Loc --      Pain Education --  Exclude from Growth Chart --    No data found.  Updated Vital Signs BP 110/78 (BP Location: Left Arm)   Pulse (!) 105   Temp 97.6 F (36.4 C) (Oral)   Resp 20   LMP 12/06/2013   SpO2 98%    Visual Acuity Right Eye Distance:   Left Eye Distance:   Bilateral Distance:    Right Eye Near:   Left Eye Near:    Bilateral Near:     Physical Exam Vitals and nursing note reviewed.  Constitutional:      Appearance: Normal appearance. She is not ill-appearing.  HENT:     Head: Atraumatic.     Nose: Nose normal.     Mouth/Throat:     Mouth: Mucous membranes are moist.  Eyes:     Extraocular Movements: Extraocular movements intact.     Conjunctiva/sclera: Conjunctivae normal.  Cardiovascular:     Rate and Rhythm: Normal rate.  Pulmonary:     Effort: Pulmonary effort is normal.  Musculoskeletal:        General: Tenderness present. No swelling, deformity or signs of injury. Normal range of motion.     Cervical back: Normal range of motion and neck supple.     Comments: Generalized tenderness to palpation to the right shoulder.  Grips strength full and equal  Skin:    General: Skin is warm and dry.     Findings: No bruising or erythema.  Neurological:     Mental Status: She is alert and oriented to person, place, and time.     Comments: Right upper extremity neurovascularly intact  Psychiatric:        Mood and Affect: Mood normal.        Thought Content: Thought content normal.        Judgment: Judgment normal.    UC Treatments / Results  Labs (all labs ordered are listed, but only abnormal results are displayed) Labs Reviewed - No data to display  EKG   Radiology DG Shoulder Right Result Date: 07/01/2024 EXAM: 1 VIEW XRAY OF THE RIGHT SHOULDER 07/01/2024 10:26:56 AM COMPARISON: None available. CLINICAL HISTORY: Right shoulder pain for 2 weeks. FINDINGS: BONES AND JOINTS: Glenohumeral joint is normally aligned. No acute fracture or dislocation. Curved (type 2) acromial undersurface. SOFT TISSUES: No abnormal calcifications. Visualized lung is unremarkable. THORACIC SPINE: Mild thoracic spondylosis. CERVICAL SPINE: Lower cervical plate and screw fixator.  IMPRESSION: 1. No acute osseous abnormality of the shoulder radiographically identified. 2. Mild thoracic spondylosis. Electronically signed by: Ryan Salvage MD 07/01/2024 10:53 AM EDT RP Workstation: [id number]    Procedures Procedures (including critical care time)  Medications Ordered in UC Medications  dexamethasone  (DECADRON ) injection 10 mg (10 mg Intramuscular Given 07/01/24 1114)    Initial Impression / Assessment and Plan / UC Course  I have reviewed the triage vital signs and the nursing notes.  Pertinent labs & imaging results that were available during my care of the patient were reviewed by me and considered in my medical decision making (see chart for details).     X-ray of the right shoulder negative for acute bony abnormality, will try IM Decadron , supportive over-the-counter medications and home care and close orthopedic follow-up if still not resolving.  Final Clinical Impressions(s) / UC Diagnoses   Final diagnoses:  Acute pain of right shoulder     Discharge Instructions      Your x-ray did not show any evidence of bony injury.  We have tried 1 more round  of steroid and I recommend following up with orthopedics if this is not resolved.  Continue heat, massage, stretches, gentle range of motion exercises    ED Prescriptions   None    PDMP not reviewed this encounter.   Stuart Vernell Norris, [place] 07/01/24 1127

## 2024-07-02 ENCOUNTER — Ambulatory Visit
Admission: EM | Admit: 2024-07-02 | Discharge: 2024-07-02 | Disposition: A | Discharge status: Home / Self Care | Attending: Family Medicine | Admitting: Family Medicine

## 2024-07-02 DIAGNOSIS — N3001 Acute cystitis with hematuria: Secondary | ICD-10-CM | POA: Insufficient documentation

## 2024-07-02 LAB — POCT URINE DIPSTICK
Bilirubin, UA: NEGATIVE
Glucose, UA: NEGATIVE mg/dL
Ketones, POC UA: NEGATIVE mg/dL
Nitrite, UA: POSITIVE — AB
POC PROTEIN,UA: 30 — AB
Spec Grav, UA: 1.015 (ref 1.010–1.025)
Urobilinogen, UA: 0.2 U/dL
pH, UA: 6.5 (ref 5.0–8.0)

## 2024-07-02 MED ORDER — CEPHALEXIN 500 MG PO CAPS: 500.0000 mg | ORAL_CAPSULE | Freq: Two times a day (BID) | ORAL | 0 refills | Status: AC

## 2024-07-02 NOTE — Discharge Instructions (Addendum)
 You have a urinary tract infection.  Take the Keflex  twice daily for 5 days as prescribed.  We will contact you if the urine culture shows we need to change the antibiotic.  In the meantime, avoid tea and drink plenty of water .  If you develop nausea/vomiting and are unable to keep fluids down or severe pain, please seek care emergently.

## 2024-07-02 NOTE — Telephone Encounter (Signed)
 Lvm for pt to return call

## 2024-07-02 NOTE — ED Triage Notes (Signed)
 Pt reports she has painful urination x 1 week

## 2024-07-02 NOTE — ED Provider Notes (Signed)
 [facility]    CSN: [id number] Arrival date & time: 07/02/24  9164      History   Chief Complaint No chief complaint on file.   HPI [name] is a 63 y.o. female.   Patient presents today with husband for 2-week history of burning with urination, increased urinary frequency and urgency, hematuria.  She denies foul urinary odor, abdominal pain, flank pain, fever, nausea/vomiting, and vaginal discharge.  No concern for STI today.  Last UTI greater than 1 year ago.  Reports memory is not the same as it once was and she has been forgetting to come in and be evaluated for urinary symptoms.  Per chart review, PCP is adjusting medications to help with memory issues possibly due to polypharmacy.     Past Medical History:  Diagnosis Date   Anemia    Arthritis    Breast cancer (HCC)    right breast cancer 5 yrs ago   Congenital malrotation of intestine (HCC) 07/2018   APPENDIX IN LUQ   Depression    Dysrhythmia    hx palpitations-too much caffiene   Headache    patient denies this dx   Scoliosis     Patient Active Problem List   Diagnosis Date Noted   History of radiation therapy 05/28/2024   Word finding difficulty 05/28/2024   IBS (irritable colon syndrome) 07/03/2023   Lumbar post-laminectomy syndrome 02/02/2023   GERD (gastroesophageal reflux disease) 01/09/2023   Rectal bleeding 01/09/2023   H/O adenomatous polyp of colon 01/09/2023   S/P lumbar fusion 08/25/2022   Low back pain 08/02/2022   Stenosis of lateral recess of lumbar spine 08/02/2022   Pill dysphagia 04/19/2022   Women's annual routine gynecological examination 06/21/2021   Lumbar radiculopathy 06/11/2021   Lumbar pain 04/19/2021   Mass of upper outer quadrant of left breast 04/13/2021   Encounter for screening fecal occult blood testing 06/18/2020   Encounter for gynecological examination with Papanicolaou smear of cervix 06/18/2020   Mass of wrist 03/23/2020   Encounter for orthopedic  follow-up care 03/17/2020   NSAID induced gastritis    Anemia 12/20/2019   Dyspepsia 10/23/2019   Simple bone cyst 11/23/2018   Constipation 04/16/2018   Encounter for screening colonoscopy 04/16/2018   Displacement of cervical intervertebral disc without myelopathy 02/20/2017   Cancer of breast, intraductal, right 12/21/2016   Pap smear, as part of routine gynecological examination 12/21/2016   Anxiety 12/09/2015   Muscular aches 01/05/2015   Reactive depression 01/05/2015   Perimenopausal and postmenopausal vasomotor symptoms 10/09/2014   Hot flashes due to tamoxifen 07/28/2014   Memory loss 07/28/2014   Vision changes 07/28/2014   Encounter for routine gynecological examination 11/21/2013   Cancer of upper-inner quadrant of female breast (HCC) 10/11/2013   Breast cancer (HCC) 09/04/2013   Cervical spondylosis without myelopathy 04/03/2013   Pain in elbow 04/03/2013    Past Surgical History:  Procedure Laterality Date   ABDOMINAL EXPOSURE N/A 08/25/2022   Procedure: ABDOMINAL EXPOSURE;  Surgeon: Gretta Lonni PARAS, MD;  Location: [facility] OR;  Service: Vascular;  Laterality: N/A;   ANKLE SURGERY     left ankle cyst   ANTERIOR LUMBAR FUSION N/A 08/25/2022   Procedure: ANTERIOR LUMBAR FOUR TO FIVE INTERBODY FUSION, ILIAC CRST BONE GRAFT HARVEST;  Surgeon: Burnetta Aures, MD;  Location: [facility] OR;  Service: Orthopedics;  Laterality: N/A;  4 HRS DR. CLARK TO DO APPROACH 3 C-BED   BACK SURGERY  2000   lumbar disc surgery  BIOPSY  01/07/2020   Procedure: BIOPSY;  Surgeon: Harvey Margo CROME, MD;  Location: [facility];  Service: Endoscopy;;   BIOPSY  02/07/2023   Procedure: BIOPSY;  Surgeon: Cindie Carlin POUR, DO;  Location: [facility];  Service: Endoscopy;;   BREAST EXCISIONAL BIOPSY Left    benign   BREAST SURGERY Left    left partail mastectomy-cyst   CERVICAL DISC SURGERY  1999   anterior and a posterier cerv fusionx2   CESAREAN SECTION     x2   COLONOSCOPY     COLONOSCOPY  WITH PROPOFOL  N/A 06/26/2018   Procedure: COLONOSCOPY WITH PROPOFOL ;  Surgeon: Harvey Margo CROME, MD;  Location: [facility];  Service: Endoscopy;  Laterality: N/A;  11:00am   COLONOSCOPY WITH PROPOFOL  N/A 02/07/2023   Procedure: COLONOSCOPY WITH PROPOFOL ;  Surgeon: Cindie Carlin POUR, DO;  Location: [facility];  Service: Endoscopy;  Laterality: N/A;  115pm, asa 2   DILATION AND CURETTAGE OF UTERUS     DILITATION & CURRETTAGE/HYSTROSCOPY WITH THERMACHOICE ABLATION  07/10/2012   Procedure: DILATATION & CURETTAGE/HYSTEROSCOPY WITH THERMACHOICE ABLATION;  Surgeon: Norleen LULLA Server, MD;  Location: [facility];  Service: Gynecology;  Laterality: N/A;  D5 13ml in, 13 ml out, temp 87degree celcius, total therapy time 16sec   ERCP     ESOPHAGOGASTRODUODENOSCOPY (EGD) WITH PROPOFOL  N/A 01/07/2020   Procedure: ESOPHAGOGASTRODUODENOSCOPY (EGD) WITH PROPOFOL ;  Surgeon: Harvey Margo CROME, MD;  [facility]: [facility];  Service: Endoscopy;  Laterality: N/A;  2:00pm   ESOPHAGOGASTRODUODENOSCOPY (EGD) WITH PROPOFOL  N/A 02/07/2023   Procedure: ESOPHAGOGASTRODUODENOSCOPY (EGD) WITH PROPOFOL ;  Surgeon: Cindie Carlin POUR, DO;  [facility]: [facility];  Service: Endoscopy;  Laterality: N/A;   EYE SURGERY     HARVEST BONE GRAFT N/A 08/25/2022   Procedure: HARVEST ILIAC BONE GRAFT;  Surgeon: Burnetta Aures, MD;  Location: [facility] OR;  Service: Orthopedics;  Laterality: N/A;   POLYPECTOMY  06/26/2018   Procedure: POLYPECTOMY;  Surgeon: Harvey Margo CROME, MD;  Location: [facility];  Service: Endoscopy;;  colon   TUBAL LIGATION     with last c-section    OB History     Gravida  4   Para  2   Term      Preterm      AB      Living  2      SAB      IAB      Ectopic      Multiple      Live Births  2            Home Medications    Prior to Admission medications   Medication Sig Start Date End Date Taking? Authorizing Provider  cephALEXin  (KEFLEX ) 500 MG capsule Take 1 capsule (500 mg total) by mouth  2 (two) times daily for 5 days. 07/02/24 07/07/24 Yes Chandra Harlene LABOR, NP  ALPRAZolam  (XANAX ) 0.5 MG tablet Take 0.5 mg by mouth 3 (three) times daily as needed for anxiety.    [provider]  bethanechol  (URECHOLINE ) 5 MG tablet Take 5 mg by mouth 3 (three) times daily. Patient not taking: Reported on 07/01/2024    [provider]  busPIRone  (BUSPAR ) 5 MG tablet Take 1 tablet (5 mg total) by mouth 2 (two) times daily. Patient not taking: Reported on 07/01/2024 05/28/24   Aletha Bene, MD  cetirizine  (ZYRTEC ) 10 MG tablet Take 1 tablet (10 mg total) by mouth daily. Patient not taking: Reported on 06/17/2024 12/02/23  Leath-Warren, Etta PARAS, NP  cholecalciferol (GNP VITAMIN D3) 10 MCG (400 UNIT) TABS tablet Take 400 Units by mouth in the morning. Patient not taking: Reported on 07/01/2024    [provider]  cyclobenzaprine  (FLEXERIL ) 10 MG tablet Take 1 tablet (10 mg total) by mouth 3 (three) times daily as needed for muscle spasms. 05/28/24   Aletha Bene, MD  escitalopram  (LEXAPRO ) 20 MG tablet Take 20 mg by mouth daily as needed. Patient not taking: Reported on 06/17/2024    [provider]  fluticasone  (FLONASE ) 50 MCG/ACT nasal spray Place 2 sprays into both nostrils daily. Patient not taking: Reported on 06/17/2024 12/02/23   Leath-Warren, Etta PARAS, NP  HYDROcodone -acetaminophen  (NORCO) 10-325 MG tablet Take 1 tablet by mouth every 6 (six) hours as needed. Patient not taking: Reported on 07/01/2024 06/07/24   Aletha Bene, MD  iron polysaccharides (NIFEREX) 150 MG capsule Take 150 mg by mouth daily as needed (low iron).    [provider]  predniSONE  (DELTASONE ) 20 MG tablet Take 2 tablets (40 mg total) by mouth daily with breakfast. Patient not taking: Reported on 06/17/2024 03/30/24   Stuart Vernell Norris, PA-C  pregabalin  (LYRICA ) 100 MG capsule Take 1 capsule (100 mg total) by mouth 2 (two) times daily. 06/17/24   Aletha Bene, MD  pregabalin  (LYRICA ) 50 MG capsule Take 1 capsule (50 mg total) by mouth 2 (two) times daily. Patient not taking: Reported on 06/17/2024 05/28/24   Aguiar, Rafaela, MD  rosuvastatin (CRESTOR) 10 MG tablet Take 10 mg by mouth daily. Patient not taking: Reported on 07/01/2024 04/22/24   [provider]  tiZANidine  (ZANAFLEX ) 4 MG capsule Take 1 capsule (4 mg total) by mouth 3 (three) times daily as needed for muscle spasms. Do not drink alcohol or drive while taking this medication.  May cause drowsiness. Patient not taking: Reported on 06/17/2024 03/30/24   Stuart Vernell Norris, PA-C  tiZANidine  (ZANAFLEX ) 4 MG capsule Take 1 capsule (4 mg total) by mouth 3 (three) times daily as needed for muscle spasms. Do not drink alcohol or drive while taking this medication.  May cause drowsiness. Patient not taking: Reported on 07/01/2024 06/26/24   Stuart Vernell Norris, PA-C    Family History Family History  Problem Relation Age of Onset   Hypertension Mother    Colon cancer Neg Hx    Gastric cancer Neg Hx    Esophageal cancer Neg Hx     Social History Social History   Tobacco Use   Smoking status: Never   Smokeless tobacco: Never  Vaping Use   Vaping status: Never Used  Substance Use Topics   Alcohol use: No   Drug use: No     Allergies   Patient has no known allergies.   Review of Systems Review of Systems Per HPI  Physical Exam Triage Vital Signs ED Triage Vitals  Encounter Vitals Group     BP 07/02/24 0906 117/82     Girls Systolic BP Percentile --      Girls Diastolic BP Percentile --      Boys Systolic BP Percentile --      Boys Diastolic BP Percentile --      Pulse Rate 07/02/24 0906 (!) 111     Resp 07/02/24 0906 18     Temp 07/02/24 0906 98.4 F (36.9 C)     Temp Source 07/02/24 0906 Oral     SpO2 07/02/24 0906 97 %     Weight --  Height --      Head Circumference --      Peak Flow --      Pain Score 07/02/24 0908 8     Pain Loc  --      Pain Education --      Exclude from Growth Chart --    No data found.  Updated Vital Signs BP 117/82 (BP Location: Right Arm)   Pulse (!) 111   Temp 98.4 F (36.9 C) (Oral)   Resp 18   LMP 12/06/2013   SpO2 97%   Visual Acuity Right Eye Distance:   Left Eye Distance:   Bilateral Distance:    Right Eye Near:   Left Eye Near:    Bilateral Near:     Physical Exam Vitals and nursing note reviewed.  Constitutional:      General: She is not in acute distress.    Appearance: She is not toxic-appearing.  Pulmonary:     Effort: Pulmonary effort is normal. No respiratory distress.  Abdominal:     General: Abdomen is flat. Bowel sounds are normal. There is no distension.     Palpations: Abdomen is soft. There is no mass.     Tenderness: There is no abdominal tenderness. There is no right CVA tenderness, left CVA tenderness or guarding.  Skin:    General: Skin is warm and dry.     Coloration: Skin is not jaundiced or pale.     Findings: No erythema.  Neurological:     Mental Status: She is alert and oriented to person, place, and time.     Motor: No weakness.     Gait: Gait normal.  Psychiatric:        Behavior: Behavior is cooperative.      UC Treatments / Results  Labs (all labs ordered are listed, but only abnormal results are displayed) Labs Reviewed  POCT URINE DIPSTICK - Abnormal; Notable for the following components:      Result Value   Clarity, UA cloudy (*)    Blood, UA large (*)    POC PROTEIN,UA =30 (*)    Nitrite, UA Positive (*)    Leukocytes, UA Large (3+) (*)    All other components within normal limits  URINE CULTURE    EKG   Radiology   Procedures Procedures (including critical care time)  Medications Ordered in UC Medications - No data to display  Initial Impression / Assessment and Plan / UC Course  I have reviewed the triage vital signs and the nursing notes.  Pertinent labs & imaging results that were available during my  care of the patient were reviewed by me and considered in my medical decision making (see chart for details).   Patient is mildly tachycardic in triage, otherwise vital signs are stable.  1. Acute cystitis with hematuria Urinalysis is positive for nitrites, large leukocytes, large blood and cloudy today Treat with Keflex  500 mg twice daily for 5 days Recent GFR normal Urine culture is pending Strict ER precautions discussed in the meantime  The patient was given the opportunity to ask questions.  All questions answered to their satisfaction.  The patient is in agreement to this plan.   Final Clinical Impressions(s) / UC Diagnoses   Final diagnoses:  Acute cystitis with hematuria     Discharge Instructions      You have a urinary tract infection.  Take the Keflex  twice daily for 5 days as prescribed.  We will contact you if the  urine culture shows we need to change the antibiotic.  In the meantime, avoid tea and drink plenty of water .  If you develop nausea/vomiting and are unable to keep fluids down or severe pain, please seek care emergently.       ED Prescriptions     Medication Sig Dispense Auth. Provider   cephALEXin  (KEFLEX ) 500 MG capsule Take 1 capsule (500 mg total) by mouth 2 (two) times daily for 5 days. 10 capsule Chandra Harlene LABOR, NP      PDMP not reviewed this encounter.   Chandra Harlene LABOR, NP 07/02/24 [phone]

## 2024-07-03 NOTE — Telephone Encounter (Signed)
 Already refilled on 07/02/24 in a separate refill encounter.

## 2024-07-03 NOTE — Telephone Encounter (Signed)
 Spoke with pt husband. I let him know that pain medication has been filled.

## 2024-07-03 NOTE — Telephone Encounter (Signed)
 Requested medication (s) are due for refill today: no  Requested medication (s) are on the active medication list: yes  Last refill:  07/02/24  Future visit scheduled: no  Notes to clinic:  duplicate request, unable to refuse     Requested Prescriptions  Pending Prescriptions Disp Refills   HYDROcodone -acetaminophen  (NORCO) 10-325 MG tablet 30 tablet 0    Sig: Take 1 tablet by mouth every 6 (six) hours as needed.     Not Delegated - Analgesics:  Opioid Agonist Combinations Failed - 07/03/2024  8:42 AM      Failed - This refill cannot be delegated      Failed - Urine Drug Screen completed in last 360 days      Passed - Valid encounter within last 3 months    Recent Outpatient Visits           2 weeks ago Other chronic pain   [facility] Aletha Bene, MD   1 month ago Encounter to establish care   [place] [facility] Aletha Bene, MD       Future Appointments             In 1 month Raulkar, Sven SQUIBB, MD [facility], [facility]

## 2024-07-04 ENCOUNTER — Inpatient Hospital Stay
Admission: RE | Admit: 2024-07-04 | Discharge: 2024-07-04 | Disposition: A | Discharge status: Home / Self Care | Payer: Self-pay | Source: Ambulatory Visit | Attending: Neurosurgery | Admitting: Neurosurgery

## 2024-07-04 ENCOUNTER — Ambulatory Visit: Payer: Self-pay | Admitting: Family Medicine

## 2024-07-04 ENCOUNTER — Inpatient Hospital Stay
Admission: RE | Admit: 2024-07-04 | Discharge: 2024-07-04 | Disposition: A | Payer: Self-pay | Source: Ambulatory Visit | Attending: Neurosurgery | Admitting: Neurosurgery

## 2024-07-04 ENCOUNTER — Ambulatory Visit (HOSPITAL_COMMUNITY): Payer: Self-pay

## 2024-07-04 ENCOUNTER — Other Ambulatory Visit: Payer: Self-pay

## 2024-07-04 ENCOUNTER — Telehealth: Payer: Self-pay

## 2024-07-04 DIAGNOSIS — Z049 Encounter for examination and observation for unspecified reason: Secondary | ICD-10-CM

## 2024-07-04 LAB — URINE CULTURE: Culture: >=100000 — AB

## 2024-07-04 MED ORDER — NITROFURANTOIN MONOHYD MACRO 100 MG PO CAPS: 100.0000 mg | ORAL_CAPSULE | Freq: Two times a day (BID) | ORAL | 0 refills | Status: DC

## 2024-07-04 NOTE — Telephone Encounter (Signed)
 I spoke with Mr Badon and informed him that Dr Aletha is off on Thursdays and will respond to his message tomorrow.

## 2024-07-04 NOTE — Telephone Encounter (Signed)
 FYI Only or Action Required?: Action required by provider: requesting medication.  Patient was last seen in primary care on 06/17/2024 by Aletha Bene, MD.  Called Nurse Triage reporting Back Pain and Pain.  Symptoms began several weeks ago.  Interventions attempted: Prescription medications: hydrocodone  but doesn't want to take it.  Symptoms are: unchanged.  Triage Disposition: See PCP When Office is Open (Within 3 Days)  Patient/caregiver understands and will follow disposition?: No, wishes to speak with PCP     Copied from CRM #[id number]. Topic: Clinical - Medication Question >> Jul 04, 2024  9:50 AM Amber H wrote: Reason for CRM: Reason for CRM: Garen (patients husband) called and stated his wife [name] was seen at Urgent Care a few days ago and she was told she had a bad case of arthritis in arm and back. She is still having symps. Garen (patients husband) wanted to know if the patient could be prescribed 800mg  Ibuprofen  due to the HYDROcodone -acetaminophen  (NORCO) 10-325 MG tablet not helping with her pain. Stated was prescribed Ibprofen 800s a few years ago by another provider ( who has since retired). Reason for Disposition  [1] MODERATE pain (e.g., interferes with normal activities) AND [2] present > 3 days  Answer Assessment - Initial Assessment Questions Rn began speaking to patient, she gave the phone to her husband because she is in so much pain she doesn't even want to talk. He states that she was in the UC recently and they gave her a shot and it helped for about 2 days and then began to get worse again. They did xray it and told her it was very bad arthritis. He states that she called on previously asking for hydrocodone  but that she doesn't like how they make her feel so she isn't taking them. He is asking for the 800mg  of ibuprofen  as it helps  to calm it down. He states when it gets cloudy it gets worse for her and she's up at night hollering in pain. Rn did say would  like an appt and he states they just saw the doctor about 2 weeks ago. Rn advised will send message to Dr. Aletha to see what next steps would be. He stated he would greatly appreciate that.      1. ONSET: When did the pain start?     On going, worse last couple of weeks 2. LOCATION: Where is the pain located?     Back and right shoulder 3. PAIN: How bad is the pain? (Scale 0-10; or none, mild, moderate, severe)     severe 4. WORK OR EXERCISE: Has there been any recent work or exercise that involved this part of the body?     no 5. CAUSE: What do you think is causing the arm pain?     arthritis 6. OTHER SYMPTOMS: Do you have any other symptoms? (e.g., neck pain, swelling, rash, fever, numbness, weakness)     no  Protocols used: Arm Pain-A-AH

## 2024-07-04 NOTE — Telephone Encounter (Signed)
 Copied from CRM [id number]. Topic: General - Other >> Jul 04, 2024 11:46 AM Jasmin G wrote: Reason for CRM: Pt requested a call back from Ms. Erika Elida RAMAN, CMA regarding recent Encounter, please refer for more details. Call pt back at [phone].

## 2024-07-05 ENCOUNTER — Ambulatory Visit: Payer: Self-pay

## 2024-07-05 ENCOUNTER — Ambulatory Visit (HOSPITAL_COMMUNITY)
Admission: RE | Admit: 2024-07-05 | Discharge: 2024-07-05 | Disposition: A | Discharge status: Home / Self Care | Source: Ambulatory Visit | Attending: Family Medicine | Admitting: Family Medicine

## 2024-07-05 ENCOUNTER — Encounter: Payer: Self-pay | Admitting: Family Medicine

## 2024-07-05 ENCOUNTER — Ambulatory Visit (INDEPENDENT_AMBULATORY_CARE_PROVIDER_SITE_OTHER): Admitting: Family Medicine

## 2024-07-05 ENCOUNTER — Ambulatory Visit: Payer: Self-pay | Admitting: Family Medicine

## 2024-07-05 VITALS — BP 124/78 | HR 103 | Ht 67.0 in | Wt 149.0 lb

## 2024-07-05 DIAGNOSIS — Z981 Arthrodesis status: Secondary | ICD-10-CM | POA: Diagnosis not present

## 2024-07-05 DIAGNOSIS — M542 Cervicalgia: Secondary | ICD-10-CM

## 2024-07-05 DIAGNOSIS — G894 Chronic pain syndrome: Secondary | ICD-10-CM

## 2024-07-05 MED ORDER — IBUPROFEN 800 MG PO TABS: 800.0000 mg | ORAL_TABLET | Freq: Three times a day (TID) | ORAL | 0 refills | Status: DC | PRN

## 2024-07-05 NOTE — Telephone Encounter (Signed)
 FYI Only or Action Required?: FYI only for provider: appointment scheduled on 10/31.  Patient was last seen in primary care on 06/17/2024 by Aletha Bene, MD.  Called Nurse Triage reporting Back Pain.  Symptoms began several weeks ago.  Interventions attempted: Prescription medications: hydrocodone , ibuprofen , muscle relaxer, steroid shots, Rest, hydration, or home remedies, and Ice/heat application.  Symptoms are: gradually worsening.  Triage Disposition: No disposition on file.  Patient/caregiver understands and will follow disposition?: Copied from CRM [id number]. Topic: Clinical - Red Word Triage >> Jul 05, 2024  9:53 AM Delon HERO wrote: Red Word that prompted transfer to Nurse Triage: Patient's husband is calling to report back pain. Requesting  Ibuprofen  800mg  Requesting appointment today 4:20p Reason for Disposition . [1] SEVERE back pain (e.g., excruciating, unable to do any normal activities) AND [2] not improved 2 hours after pain medicine  Answer Assessment - Initial Assessment Questions Back pain and Shoulder pain is getting worse. Like spasming sharp pain in the shoulder. 10/10. Even in her sleep.  Hydrocodone  not helping, laying with heating pad, muscle relaxer no benefit. Denies CP, SOB, Fever or dizziness. Bengay helping calm it slightly. Arthritis showing in back and arm.  1. ONSET: When did the pain begin? (e.g., minutes, hours, days)     A few weeks  2. LOCATION: Where does it hurt? (upper, mid or lower back)     Low back and right arm  3. SEVERITY: How bad is the pain?  (e.g., Scale 1-10; mild, moderate, or severe)     10/10 pain-  4. PATTERN: Is the pain constant? (e.g., yes, no; constant, intermittent)      Constant  5. RADIATION: Does the pain shoot into your legs or somewhere else?     Back pain and r arm pain  6. CAUSE:  What do you think is causing the back pain?      unsure 7. BACK OVERUSE:  Any recent lifting of heavy objects, strenuous  work or exercise?     denies 8. MEDICINES: What have you taken so far for the pain? (e.g., nothing, acetaminophen , NSAIDS)     Oxycodone  not working, trying ibuprofen  now 9. NEUROLOGIC SYMPTOMS: Do you have any weakness, numbness, or problems with bowel/bladder control?     denies 10. OTHER SYMPTOMS: Do you have any other symptoms? (e.g., fever, abdomen pain, burning with urination, blood in urine)       Denies  Protocols used: Back Pain-A-AH

## 2024-07-05 NOTE — Telephone Encounter (Signed)
 Patient husband is calling to follow up on the request for script for Ibuprofen  800mg  to [facility] pharmacy

## 2024-07-05 NOTE — Progress Notes (Signed)
 Patient Office Visit  Assessment & Plan:  Acute neck pain -     Ibuprofen ; Take 1 tablet (800 mg total) by mouth every 8 (eight) hours as needed for moderate pain (pain score 4-6).  Dispense: 90 tablet; Refill: 0 -     DG Cervical Spine Complete; Future  Chronic pain syndrome -     Ibuprofen ; Take 1 tablet (800 mg total) by mouth every 8 (eight) hours as needed for moderate pain (pain score 4-6).  Dispense: 90 tablet; Refill: 0   Assessment and Plan    Cervicalgia with chronic pain syndrome Chronic cervicalgia with severe, radiating pain and muscle spasms. Current pain differs from previous episodes. Flexeril  and hydrocodone  ineffective. Lyrica  increased. Differential includes arthritis, but pain is atypical. Consider Cymbalta for pain and mood, requires stopping Lexapro . Pending pain management and neurosurgery consultations. - Continue Lyrica  and hydrocodone , alternate to reduce hydrocodone  use. - Prescribe ibuprofen  800 mg TID with food. - Order urgent cervical spine x-ray. - Consider cervical spine MRI if x-ray inconclusive.          No follow-ups on file.   Subjective:    Patient ID: [name], female    DOB: 1961-06-27  Age: 63 y.o. MRN: [id number]  Chief Complaint  Patient presents with  . Muscle Pain    Pt c/o of increased back pain and muscle spasms.     Muscle Pain   Discussed the use of AI scribe software for clinical note transcription with the patient, who gave verbal consent to proceed.  History of Present Illness   [name] is a 64 year old female with a history of neck surgeries who presents with worsening neck and right arm pain. She is accompanied by her husband. patient has been having chronic pain for years but the past 2 weeks it has been her neck - states pain level is over 11/10  She has been experiencing severe neck and arm pain for the past two weeks, described as sharp, knife-like spasms originating from the upper back and radiating down the  arm. The pain is constant, rated as 10 out of 10, and significantly impacts her sleep, causing her to wake frequently due to discomfort. patient taking flexeril  but not helping.   She has a history of neck problems, including three surgeries, the first of which occurred in the late 1990s. Despite these interventions, she continues to experience significant pain.  Her current medication regimen includes Lyrica , hydrocodone , and Flexeril . She takes hydrocodone  twice daily but reports it is no longer effective. Lyrica  was recently increased to three times daily or 100mg  BID, but it does not provide immediate relief. Patient stopped taking Lyrica . Flexeril  has not alleviated her symptoms. She has previously tried ibuprofen , which has helped in the past. Prednisone  has been used in the past. She has not found relief with other medications, including Zanaflex , which she has tried, but she has not tried Cymbalta yet.   Her family history includes arthritis, as her mother had the condition, but she notes that her current pain is more severe than typical arthritis pain.  No constipation from hydrocodone  use. Severe pain in the neck and arm, with no relief from current medications. She experiences muscle spasms and describes the pain as sharp and constant. She has difficulty sleeping due to pain and notes weakness in the right arm.     History of Present Illness [name] is a 63 year old female with a history of neck surgeries who presents  with worsening neck and right arm pain. She is accompanied by her husband. patient has been having chronic pain for years but the past 2 weeks it has been her neck - states pain level is over 11/10  She has been experiencing severe neck and arm pain for the past two weeks, described as sharp, knife-like spasms originating from the upper back and radiating down the arm. The pain is constant, rated as 10 out of 10, and significantly impacts her sleep, causing her to wake  frequently due to discomfort. patient taking flexeril  but not helping.   She has a history of neck problems, including three surgeries, the first of which occurred in the late 1990s. Despite these interventions, she continues to experience significant pain.  Her current medication regimen includes Lyrica , hydrocodone , and Flexeril . She takes hydrocodone  twice daily but reports it is no longer effective. Lyrica  was recently increased to three times daily or 100mg  BID, but it does not provide immediate relief. Patient stopped taking Lyrica . Flexeril  has not alleviated her symptoms. She has previously tried ibuprofen , which has helped in the past. Prednisone  has been used in the past. She has not found relief with other medications, including Zanaflex , which she has tried, but she has not tried Cymbalta yet.   Her family history includes arthritis, as her mother had the condition, but she notes that her current pain is more severe than typical arthritis pain.  No constipation from hydrocodone  use. Severe pain in the neck and arm, with no relief from current medications. She experiences muscle spasms and describes the pain as sharp and constant. She has difficulty sleeping due to pain and notes weakness in the right arm.  Physical Exam NECK: Tender to palpation. NEUROLOGICAL: Right arm pain on resistance. Grip strength normal.  Assessment and Plan Cervicalgia with chronic pain syndrome Chronic cervicalgia with severe, radiating pain and muscle spasms. Current pain differs from previous episodes. Flexeril  and hydrocodone  ineffective. Lyrica  increased. Differential includes arthritis, but pain is atypical. Consider Cymbalta for pain and mood, requires stopping Lexapro . Pending pain management and neurosurgery consultations. - Continue Lyrica  and hydrocodone , alternate to reduce hydrocodone  use. Patient now aware that Lyrica  may take some time to get in her system.  - Prescribe ibuprofen  800 mg TID with  food. - Order urgent cervical spine x-ray. - Consider cervical spine MRI if x-ray inconclusive.    The 10-year ASCVD risk score (Arnett DK, et al., 2019) is: 6.4%  Past Medical History:  Diagnosis Date  . Anemia   . Arthritis   . Breast cancer (HCC)    right breast cancer 5 yrs ago  . Congenital malrotation of intestine (HCC) 07/2018   APPENDIX IN LUQ  . Depression   . Dysrhythmia    hx palpitations-too much caffiene  . Headache    patient denies this dx  . Scoliosis    Past Surgical History:  Procedure Laterality Date  . ABDOMINAL EXPOSURE N/A 08/25/2022   Procedure: ABDOMINAL EXPOSURE;  Surgeon: Gretta Lonni PARAS, MD;  Location: [facility] OR;  Service: Vascular;  Laterality: N/A;  . ANKLE SURGERY     left ankle cyst  . ANTERIOR LUMBAR FUSION N/A 08/25/2022   Procedure: ANTERIOR LUMBAR FOUR TO FIVE INTERBODY FUSION, ILIAC CRST BONE GRAFT HARVEST;  Surgeon: Burnetta Aures, MD;  Location: [facility] OR;  Service: Orthopedics;  Laterality: N/A;  4 HRS DR. CLARK TO DO APPROACH 3 C-BED  . BACK SURGERY  2000   lumbar disc surgery  . BIOPSY  01/07/2020  Procedure: BIOPSY;  Surgeon: Harvey Margo CROME, MD;  Location: [facility];  Service: Endoscopy;;  . BIOPSY  02/07/2023   Procedure: BIOPSY;  Surgeon: Cindie Carlin POUR, DO;  Location: [facility];  Service: Endoscopy;;  . BREAST EXCISIONAL BIOPSY Left    benign  . BREAST SURGERY Left    left partail mastectomy-cyst  . CERVICAL DISC SURGERY  1999   anterior and a posterier cerv fusionx2  . CESAREAN SECTION     x2  . COLONOSCOPY    . COLONOSCOPY WITH PROPOFOL  N/A 06/26/2018   Procedure: COLONOSCOPY WITH PROPOFOL ;  Surgeon: Harvey Margo CROME, MD;  Location: [facility];  Service: Endoscopy;  Laterality: N/A;  11:00am  . COLONOSCOPY WITH PROPOFOL  N/A 02/07/2023   Procedure: COLONOSCOPY WITH PROPOFOL ;  Surgeon: Cindie Carlin POUR, DO;  Location: [facility];  Service: Endoscopy;  Laterality: N/A;  115pm, asa 2  . DILATION AND CURETTAGE  OF UTERUS    . DILITATION & CURRETTAGE/HYSTROSCOPY WITH THERMACHOICE ABLATION  07/10/2012   Procedure: DILATATION & CURETTAGE/HYSTEROSCOPY WITH THERMACHOICE ABLATION;  Surgeon: Norleen LULLA Server, MD;  Location: [facility] ORS;  Service: Gynecology;  Laterality: N/A;  D5 13ml in, 13 ml out, temp 87degree celcius, total therapy time 16sec  . ERCP    . ESOPHAGOGASTRODUODENOSCOPY (EGD) WITH PROPOFOL  N/A 01/07/2020   Procedure: ESOPHAGOGASTRODUODENOSCOPY (EGD) WITH PROPOFOL ;  Surgeon: Harvey Margo CROME, MD;  Location: [facility];  Service: Endoscopy;  Laterality: N/A;  2:00pm  . ESOPHAGOGASTRODUODENOSCOPY (EGD) WITH PROPOFOL  N/A 02/07/2023   Procedure: ESOPHAGOGASTRODUODENOSCOPY (EGD) WITH PROPOFOL ;  Surgeon: Cindie Carlin POUR, DO;  Location: [facility];  Service: Endoscopy;  Laterality: N/A;  . EYE SURGERY    . HARVEST BONE GRAFT N/A 08/25/2022   Procedure: HARVEST ILIAC BONE GRAFT;  Surgeon: Burnetta Aures, MD;  Location: [facility] OR;  Service: Orthopedics;  Laterality: N/A;  . POLYPECTOMY  06/26/2018   Procedure: POLYPECTOMY;  Surgeon: Harvey Margo CROME, MD;  Location: [facility];  Service: Endoscopy;;  colon  . TUBAL LIGATION     with last c-section   Social History   Tobacco Use  . Smoking status: Never  . Smokeless tobacco: Never  Vaping Use  . Vaping status: Never Used  Substance Use Topics  . Alcohol use: No  . Drug use: No   Family History  Problem Relation Age of Onset  . Hypertension Mother   . Colon cancer Neg Hx   . Gastric cancer Neg Hx   . Esophageal cancer Neg Hx    No Known Allergies  ROS    Objective:    BP 124/78   Pulse (!) 103   Ht 5' 7 (1.702 m)   Wt 149 lb (67.6 kg)   LMP 12/06/2013   SpO2 98%   BMI 23.34 kg/m  BP Readings from Last 3 Encounters:  07/05/24 124/78  07/02/24 117/82  07/01/24 110/78   Wt Readings from Last 3 Encounters:  07/05/24 149 lb (67.6 kg)  06/17/24 151 lb (68.5 kg)  05/28/24 147 lb 8 oz (66.9 kg)    Physical Exam Vitals and  nursing note reviewed.  Constitutional:      General: She is in acute distress.     Appearance: Normal appearance.     Comments: Comes in with her husband.   HENT:     Head: Normocephalic.     Right Ear: Tympanic membrane, ear canal and external ear normal.     Left Ear: Tympanic membrane, ear canal  and external ear normal.  Eyes:     Extraocular Movements: Extraocular movements intact.     Conjunctiva/sclera: Conjunctivae normal.     Pupils: Pupils are equal, round, and reactive to light.  Neck:     Comments: Surgical scar noted posteriorly from previous neck surgery and anterior scar Cardiovascular:     Rate and Rhythm: Regular rhythm. Tachycardia present.     Heart sounds: Normal heart sounds.  Pulmonary:     Effort: Pulmonary effort is normal.     Breath sounds: Normal breath sounds. No wheezing.  Musculoskeletal:     Cervical back: Spasms and tenderness present. Decreased range of motion.     Right lower leg: No edema.     Left lower leg: No edema.  Neurological:     General: No focal deficit present.     Mental Status: She is alert and oriented to person, place, and time.     Comments: Good strength UE bilaterally including grip strength patient is holding right arm during office visit.   Psychiatric:        Mood and Affect: Mood normal.        Behavior: Behavior normal.      No results found for any visits on 07/05/24.

## 2024-07-12 ENCOUNTER — Ambulatory Visit
Admission: EM | Admit: 2024-07-12 | Discharge: 2024-07-12 | Disposition: A | Discharge status: Home / Self Care | Attending: Nurse Practitioner | Admitting: Nurse Practitioner

## 2024-07-12 DIAGNOSIS — G894 Chronic pain syndrome: Secondary | ICD-10-CM

## 2024-07-12 MED ORDER — LIDOCAINE 5 % EX PTCH: 1.0000 | MEDICATED_PATCH | CUTANEOUS | 0 refills | Status: AC

## 2024-07-12 MED ORDER — HYDROCODONE-ACETAMINOPHEN 10-325 MG PO TABS: 1.0000 | ORAL_TABLET | Freq: Four times a day (QID) | ORAL | 0 refills | Status: DC | PRN

## 2024-07-12 NOTE — ED Triage Notes (Signed)
 Pt reports she has pain in her right shoulder that is going down to her feet and low back pain x 1 month

## 2024-07-12 NOTE — ED Provider Notes (Signed)
 [facility]    CSN: [id number] Arrival date & time: 07/12/24  1155      History   Chief Complaint No chief complaint on file.   HPI [name] is a 63 y.o. female.   Patient presents today with concerns of ongoing right shoulder, neck, and low back pain.  Reports the neck pain radiates down her right arm to her fingertips and the low back pain radiates down the right leg.  She has had this pain for many years and recently, her primary care provider retired abruptly.  She established with a new primary care provider a couple of months ago, who is working on getting the patient's pain under control.  She has been referred to neurosurgery and pain management for this ongoing pain.  She has been prescribed hydrocodone /acetaminophen  in the past and she reports this is no longer helping and she stopped taking it.  She is currently taking ibuprofen , pregabalin  and Flexeril  for the pain without improvement.  She has been seen 2 other times in urgent care in the past month for the same pain and was treated with corticosteroids both times without any improvement.  She denies recent fall or injury and reports this is a chronic pain she has always had.  During triage today, patient confused as to what medications she is currently taking.  Patient left urgent care to go to her car to obtain a medication list and brought it back with her to the exam room.  I reviewed this at length with the patient.    Past Medical History:  Diagnosis Date   Anemia    Arthritis    Breast cancer (HCC)    right breast cancer 5 yrs ago   Congenital malrotation of intestine (HCC) 07/2018   APPENDIX IN LUQ   Depression    Dysrhythmia    hx palpitations-too much caffiene   Headache    patient denies this dx   Scoliosis     Patient Active Problem List   Diagnosis Date Noted   History of radiation therapy 05/28/2024   Word finding difficulty 05/28/2024   IBS (irritable colon syndrome) 07/03/2023    Lumbar post-laminectomy syndrome 02/02/2023   GERD (gastroesophageal reflux disease) 01/09/2023   Rectal bleeding 01/09/2023   H/O adenomatous polyp of colon 01/09/2023   S/P lumbar fusion 08/25/2022   Low back pain 08/02/2022   Stenosis of lateral recess of lumbar spine 08/02/2022   Pill dysphagia 04/19/2022   Women's annual routine gynecological examination 06/21/2021   Lumbar radiculopathy 06/11/2021   Lumbar pain 04/19/2021   Mass of upper outer quadrant of left breast 04/13/2021   Encounter for screening fecal occult blood testing 06/18/2020   Encounter for gynecological examination with Papanicolaou smear of cervix 06/18/2020   Mass of wrist 03/23/2020   Encounter for orthopedic follow-up care 03/17/2020   NSAID induced gastritis    Anemia 12/20/2019   Dyspepsia 10/23/2019   Simple bone cyst 11/23/2018   Constipation 04/16/2018   Encounter for screening colonoscopy 04/16/2018   Displacement of cervical intervertebral disc without myelopathy 02/20/2017   Cancer of breast, intraductal, right 12/21/2016   Pap smear, as part of routine gynecological examination 12/21/2016   Anxiety 12/09/2015   Muscular aches 01/05/2015   Reactive depression 01/05/2015   Perimenopausal and postmenopausal vasomotor symptoms 10/09/2014   Hot flashes due to tamoxifen 07/28/2014   Memory loss 07/28/2014   Vision changes 07/28/2014   Encounter for routine gynecological examination 11/21/2013   Cancer of  upper-inner quadrant of female breast (HCC) 10/11/2013   Breast cancer (HCC) 09/04/2013   Cervical spondylosis without myelopathy 04/03/2013   Pain in elbow 04/03/2013    Past Surgical History:  Procedure Laterality Date   ABDOMINAL EXPOSURE N/A 08/25/2022   Procedure: ABDOMINAL EXPOSURE;  Surgeon: Gretta Lonni PARAS, MD;  Location: [facility] OR;  Service: Vascular;  Laterality: N/A;   ANKLE SURGERY     left ankle cyst   ANTERIOR LUMBAR FUSION N/A 08/25/2022   Procedure: ANTERIOR LUMBAR FOUR TO  FIVE INTERBODY FUSION, ILIAC CRST BONE GRAFT HARVEST;  Surgeon: Burnetta Aures, MD;  Location: [facility] OR;  Service: Orthopedics;  Laterality: N/A;  4 HRS DR. CLARK TO DO APPROACH 3 C-BED   BACK SURGERY  2000   lumbar disc surgery   BIOPSY  01/07/2020   Procedure: BIOPSY;  Surgeon: Harvey Margo CROME, MD;  Location: [facility];  Service: Endoscopy;;   BIOPSY  02/07/2023   Procedure: BIOPSY;  Surgeon: Cindie Carlin POUR, DO;  Location: [facility];  Service: Endoscopy;;   BREAST EXCISIONAL BIOPSY Left    benign   BREAST SURGERY Left    left partail mastectomy-cyst   CERVICAL DISC SURGERY  1999   anterior and a posterier cerv fusionx2   CESAREAN SECTION     x2   COLONOSCOPY     COLONOSCOPY WITH PROPOFOL  N/A 06/26/2018   Procedure: COLONOSCOPY WITH PROPOFOL ;  Surgeon: Harvey Margo CROME, MD;  Location: [facility];  Service: Endoscopy;  Laterality: N/A;  11:00am   COLONOSCOPY WITH PROPOFOL  N/A 02/07/2023   Procedure: COLONOSCOPY WITH PROPOFOL ;  Surgeon: Cindie Carlin POUR, DO;  Location: [facility];  Service: Endoscopy;  Laterality: N/A;  115pm, asa 2   DILATION AND CURETTAGE OF UTERUS     DILITATION & CURRETTAGE/HYSTROSCOPY WITH THERMACHOICE ABLATION  07/10/2012   Procedure: DILATATION & CURETTAGE/HYSTEROSCOPY WITH THERMACHOICE ABLATION;  Surgeon: Norleen LULLA Server, MD;  Location: [facility];  Service: Gynecology;  Laterality: N/A;  D5 13ml in, 13 ml out, temp 87degree celcius, total therapy time 16sec   ERCP     ESOPHAGOGASTRODUODENOSCOPY (EGD) WITH PROPOFOL  N/A 01/07/2020   Procedure: ESOPHAGOGASTRODUODENOSCOPY (EGD) WITH PROPOFOL ;  Surgeon: Harvey Margo CROME, MD;  [facility]: [facility];  Service: Endoscopy;  Laterality: N/A;  2:00pm   ESOPHAGOGASTRODUODENOSCOPY (EGD) WITH PROPOFOL  N/A 02/07/2023   Procedure: ESOPHAGOGASTRODUODENOSCOPY (EGD) WITH PROPOFOL ;  Surgeon: Cindie Carlin POUR, DO;  [facility]: [facility];  Service: Endoscopy;  Laterality: N/A;   EYE SURGERY     HARVEST BONE GRAFT N/A  08/25/2022   Procedure: HARVEST ILIAC BONE GRAFT;  Surgeon: Burnetta Aures, MD;  Location: [facility] OR;  Service: Orthopedics;  Laterality: N/A;   POLYPECTOMY  06/26/2018   Procedure: POLYPECTOMY;  Surgeon: Harvey Margo CROME, MD;  Location: [facility];  Service: Endoscopy;;  colon   TUBAL LIGATION     with last c-section    OB History     Gravida  4   Para  2   Term      Preterm      AB      Living  2      SAB      IAB      Ectopic      Multiple      Live Births  2            Home Medications    Prior to Admission medications   Medication Sig Start Date End Date Taking? Authorizing  Provider  lidocaine  (LIDODERM ) 5 % Place 1 patch onto the skin daily. Remove & Discard patch within 12 hours or as directed by MD 07/12/24  Yes Chandra Harlene LABOR, NP  ALPRAZolam  (XANAX ) 0.5 MG tablet Take 0.5 mg by mouth 3 (three) times daily as needed for anxiety.    [provider]  bethanechol  (URECHOLINE ) 5 MG tablet Take 5 mg by mouth 3 (three) times daily. Patient not taking: Reported on 07/05/2024    [provider]  busPIRone  (BUSPAR ) 5 MG tablet Take 1 tablet (5 mg total) by mouth 2 (two) times daily. Patient not taking: Reported on 07/05/2024 05/28/24   Aletha Bene, MD  cetirizine  (ZYRTEC ) 10 MG tablet Take 1 tablet (10 mg total) by mouth daily. Patient not taking: Reported on 06/17/2024 12/02/23   Leath-Warren, Etta PARAS, NP  cholecalciferol (GNP VITAMIN D3) 10 MCG (400 UNIT) TABS tablet Take 400 Units by mouth in the morning. Patient not taking: Reported on 07/05/2024    [provider]  cyclobenzaprine  (FLEXERIL ) 10 MG tablet Take 1 tablet (10 mg total) by mouth 3 (three) times daily as needed for muscle spasms. 05/28/24   Aletha Bene, MD  escitalopram  (LEXAPRO ) 20 MG tablet Take 20 mg by mouth daily as needed. Patient not taking: Reported on 07/05/2024    [provider]  fluticasone  (FLONASE ) 50 MCG/ACT nasal spray Place 2 sprays  into both nostrils daily. Patient not taking: Reported on 07/05/2024 12/02/23   Leath-Warren, Etta PARAS, NP  HYDROcodone -acetaminophen  (NORCO) 10-325 MG tablet TAKE ONE TABLET BY MOUTH EVERY 6 HOURS AS NEEDED. 07/02/24   Aletha Bene, MD  ibuprofen  (ADVIL ) 800 MG tablet Take 1 tablet (800 mg total) by mouth every 8 (eight) hours as needed for moderate pain (pain score 4-6). 07/05/24   Aguiar, Rafaela, MD  iron polysaccharides (NIFEREX) 150 MG capsule Take 150 mg by mouth daily as needed (low iron).    [provider]  pregabalin  (LYRICA ) 100 MG capsule Take 1 capsule (100 mg total) by mouth 2 (two) times daily. Patient not taking: Reported on 07/05/2024 06/17/24   Aletha Bene, MD  pregabalin  (LYRICA ) 50 MG capsule Take 1 capsule (50 mg total) by mouth 2 (two) times daily. Patient not taking: Reported on 07/05/2024 05/28/24   Aguiar, Rafaela, MD  rosuvastatin (CRESTOR) 10 MG tablet Take 10 mg by mouth daily. Patient not taking: Reported on 07/01/2024 04/22/24   [provider]    Family History Family History  Problem Relation Age of Onset   Hypertension Mother    Colon cancer Neg Hx    Gastric cancer Neg Hx    Esophageal cancer Neg Hx     Social History Social History   Tobacco Use   Smoking status: Never   Smokeless tobacco: Never  Vaping Use   Vaping status: Never Used  Substance Use Topics   Alcohol use: No   Drug use: No     Allergies   Patient has no known allergies.   Review of Systems Review of Systems Per HPI  Physical Exam Triage Vital Signs ED Triage Vitals  Encounter Vitals Group     BP 07/12/24 1305 113/78     Girls Systolic BP Percentile --      Girls Diastolic BP Percentile --      Boys Systolic BP Percentile --      Boys Diastolic BP Percentile --      Pulse Rate 07/12/24 1305 100     Resp 07/12/24 1305 16  Temp 07/12/24 1305 98.7 F (37.1 C)     Temp Source 07/12/24 1305 Oral     SpO2 07/12/24 1305 98 %     Weight --       Height --      Head Circumference --      Peak Flow --      Pain Score 07/12/24 1308 10     Pain Loc --      Pain Education --      Exclude from Growth Chart --    No data found.  Updated Vital Signs BP 113/78 (BP Location: Right Arm)   Pulse 100   Temp 98.7 F (37.1 C) (Oral)   Resp 16   LMP 12/06/2013   SpO2 98%   Visual Acuity Right Eye Distance:   Left Eye Distance:   Bilateral Distance:    Right Eye Near:   Left Eye Near:    Bilateral Near:     Physical Exam Vitals and nursing note reviewed.  Constitutional:      General: She is not in acute distress.    Appearance: Normal appearance. She is not toxic-appearing.  HENT:     Mouth/Throat:     Mouth: Mucous membranes are moist.     Pharynx: Oropharynx is clear.  Pulmonary:     Effort: Pulmonary effort is normal. No respiratory distress.  Musculoskeletal:     Comments: Patient moving upper and lower extremities equally and without difficulty.  She is neurovascularly intact in distal upper and lower extremities.  Skin:    General: Skin is warm and dry.     Capillary Refill: Capillary refill takes less than 2 seconds.     Coloration: Skin is not jaundiced or pale.     Findings: No erythema.  Neurological:     Mental Status: She is alert and oriented to person, place, and time.  Psychiatric:        Behavior: Behavior is cooperative.      UC Treatments / Results  Labs (all labs ordered are listed, but only abnormal results are displayed) Labs Reviewed - No data to display  EKG   Radiology No results found.  Procedures Procedures (including critical care time)  Medications Ordered in UC Medications - No data to display  Initial Impression / Assessment and Plan / UC Course  I have reviewed the triage vital signs and the nursing notes.  Pertinent labs & imaging results that were available during my care of the patient were reviewed by me and considered in my medical decision making (see chart  for details).   Patient is well-appearing, normotensive, afebrile, not tachycardic, not tachypneic, oxygenating well on room air.   1. Chronic pain syndrome Narcotics no longer are effective per patient Corticosteroids have been ineffective per patient She is currently taking pregabalin , NSAID, and muscle relaxant Has been referred to neurosurgery and pain management Also discussed that some of the medications she is taking may take time to get into her system We discussed urgent care scope and given chronicity of pain, I recommended touching base with primary care provider The meantime, can try topical lidocaine  patches Strict ER precautions discussed with patient  The patient was given the opportunity to ask questions.  All questions answered to their satisfaction.  The patient is in agreement to this plan.   Final Clinical Impressions(s) / UC Diagnoses   Final diagnoses:  Chronic pain syndrome     Discharge Instructions      Please follow  up with your PCP regarding the chronic pain you are having in your right arm and right leg.  In the meantime, you can try topical lidocaine  patches to the painful areas.  If insurance does not cover the prescription patch, you can purchase over the counter.    ED Prescriptions     Medication Sig Dispense Auth. Provider   lidocaine  (LIDODERM ) 5 % Place 1 patch onto the skin daily. Remove & Discard patch within 12 hours or as directed by MD 30 patch Chandra Harlene LABOR, NP      I have reviewed the PDMP during this encounter.   Chandra Harlene LABOR, NP 07/12/24 [phone]

## 2024-07-12 NOTE — Discharge Instructions (Signed)
 Please follow up with your PCP regarding the chronic pain you are having in your right arm and right leg.  In the meantime, you can try topical lidocaine  patches to the painful areas.  If insurance does not cover the prescription patch, you can purchase over the counter.

## 2024-07-15 ENCOUNTER — Other Ambulatory Visit (HOSPITAL_COMMUNITY): Payer: Self-pay

## 2024-07-15 ENCOUNTER — Telehealth: Payer: Self-pay | Admitting: Pharmacy Technician

## 2024-07-15 NOTE — Telephone Encounter (Signed)
 Pharmacy Patient Advocate Encounter   Received notification from Onbase that prior authorization for Lidocaine  5% patches is required/requested.   Insurance verification completed.   The patient is insured through [facility] AD[employer].   Per test claim: PA required; PA started via CoverMyMeds. KEY [id number] . Waiting for clinical questions to populate.

## 2024-07-17 ENCOUNTER — Ambulatory Visit: Payer: Self-pay

## 2024-07-17 ENCOUNTER — Encounter (HOSPITAL_COMMUNITY): Payer: Self-pay | Admitting: Emergency Medicine

## 2024-07-17 ENCOUNTER — Emergency Department (HOSPITAL_COMMUNITY)
Admission: EM | Admit: 2024-07-17 | Discharge: 2024-07-17 | Disposition: A | Discharge status: Home / Self Care | Attending: Emergency Medicine | Admitting: Emergency Medicine

## 2024-07-17 ENCOUNTER — Emergency Department (HOSPITAL_COMMUNITY)

## 2024-07-17 ENCOUNTER — Other Ambulatory Visit: Payer: Self-pay

## 2024-07-17 ENCOUNTER — Telehealth: Payer: Self-pay

## 2024-07-17 DIAGNOSIS — M5416 Radiculopathy, lumbar region: Secondary | ICD-10-CM | POA: Diagnosis not present

## 2024-07-17 DIAGNOSIS — M5136 Other intervertebral disc degeneration, lumbar region with discogenic back pain only: Secondary | ICD-10-CM | POA: Diagnosis not present

## 2024-07-17 DIAGNOSIS — M5011 Cervical disc disorder with radiculopathy,  high cervical region: Secondary | ICD-10-CM | POA: Diagnosis not present

## 2024-07-17 DIAGNOSIS — E869 Volume depletion, unspecified: Secondary | ICD-10-CM | POA: Diagnosis not present

## 2024-07-17 DIAGNOSIS — M4322 Fusion of spine, cervical region: Secondary | ICD-10-CM | POA: Diagnosis not present

## 2024-07-17 DIAGNOSIS — M5412 Radiculopathy, cervical region: Secondary | ICD-10-CM | POA: Diagnosis not present

## 2024-07-17 DIAGNOSIS — M48061 Spinal stenosis, lumbar region without neurogenic claudication: Secondary | ICD-10-CM | POA: Diagnosis not present

## 2024-07-17 DIAGNOSIS — R9431 Abnormal electrocardiogram [ECG] [EKG]: Secondary | ICD-10-CM | POA: Diagnosis not present

## 2024-07-17 DIAGNOSIS — M79604 Pain in right leg: Secondary | ICD-10-CM | POA: Diagnosis present

## 2024-07-17 DIAGNOSIS — M4802 Spinal stenosis, cervical region: Secondary | ICD-10-CM | POA: Diagnosis not present

## 2024-07-17 DIAGNOSIS — Z853 Personal history of malignant neoplasm of breast: Secondary | ICD-10-CM | POA: Insufficient documentation

## 2024-07-17 LAB — COMPREHENSIVE METABOLIC PANEL WITH GFR
ALT: 12 U/L (ref 0–44)
AST: 24 U/L (ref 15–41)
Albumin: 4 g/dL (ref 3.5–5.0)
Alkaline Phosphatase: 69 U/L (ref 38–126)
Anion gap: 9 (ref 5–15)
BUN: 9 mg/dL (ref 8–23)
CO2: 27 mmol/L (ref 22–32)
Calcium: 9 mg/dL (ref 8.9–10.3)
Chloride: 107 mmol/L (ref 98–111)
Creatinine, Ser: 0.49 mg/dL (ref 0.44–1.00)
GFR, Estimated: >60 mL/min (ref >60)
Glucose, Bld: 90 mg/dL (ref 70–99)
Potassium: 3.6 mmol/L (ref 3.5–5.1)
Sodium: 142 mmol/L (ref 135–145)
Total Bilirubin: 0.4 mg/dL (ref 0.0–1.2)
Total Protein: 6.6 g/dL (ref 6.5–8.1)

## 2024-07-17 LAB — CBC WITH DIFFERENTIAL/PLATELET
Abs Immature Granulocytes: 0 K/uL (ref 0.00–0.07)
Basophils Absolute: 0 K/uL (ref 0.0–0.1)
Basophils Relative: 1 %
Eosinophils Absolute: 0.1 K/uL (ref 0.0–0.5)
Eosinophils Relative: 2 %
HCT: 33.1 % — ABNORMAL LOW (ref 36.0–46.0)
Hemoglobin: 10.7 g/dL — ABNORMAL LOW (ref 12.0–15.0)
Immature Granulocytes: 0 %
Lymphocytes Relative: 34 %
Lymphs Abs: 1 K/uL (ref 0.7–4.0)
MCH: 30.7 pg (ref 26.0–34.0)
MCHC: 32.3 g/dL (ref 30.0–36.0)
MCV: 95.1 fL (ref 80.0–100.0)
Monocytes Absolute: 0.3 K/uL (ref 0.1–1.0)
Monocytes Relative: 12 %
Neutro Abs: 1.5 K/uL — ABNORMAL LOW (ref 1.7–7.7)
Neutrophils Relative %: 51 %
Platelets: 208 K/uL (ref 150–400)
RBC: 3.48 MIL/uL — ABNORMAL LOW (ref 3.87–5.11)
RDW: 12.8 % (ref 11.5–15.5)
WBC: 2.8 K/uL — ABNORMAL LOW (ref 4.0–10.5)
nRBC: 0 % (ref 0.0–0.2)

## 2024-07-17 LAB — MAGNESIUM: Magnesium: 2.1 mg/dL (ref 1.7–2.4)

## 2024-07-17 MED ORDER — GADOBUTROL 1 MMOL/ML IV SOLN
7.0000 mL | Freq: Once | INTRAVENOUS | Status: AC | PRN
  Administered 2024-07-17: 7 mL via INTRAVENOUS

## 2024-07-17 MED ORDER — LIDOCAINE 5 % EX PTCH
2.0000 | MEDICATED_PATCH | CUTANEOUS | Status: DC
  Administered 2024-07-17: 2 via TRANSDERMAL
  Filled 2024-07-17: qty 2

## 2024-07-17 MED ORDER — METHOCARBAMOL 500 MG PO TABS
500.0000 mg | ORAL_TABLET | Freq: Once | ORAL | Status: AC
  Administered 2024-07-17: 500 mg via ORAL
  Filled 2024-07-17: qty 1

## 2024-07-17 MED ORDER — KETOROLAC TROMETHAMINE 15 MG/ML IJ SOLN
15.0000 mg | Freq: Once | INTRAMUSCULAR | Status: AC
  Administered 2024-07-17: 15 mg via INTRAVENOUS
  Filled 2024-07-17: qty 1

## 2024-07-17 MED ORDER — OXYCODONE HCL 5 MG PO TABS
5.0000 mg | ORAL_TABLET | Freq: Once | ORAL | Status: AC
Start: 2024-07-17 — End: 2024-07-17
  Administered 2024-07-17: 5 mg via ORAL
  Filled 2024-07-17: qty 1

## 2024-07-17 MED ORDER — ACETAMINOPHEN 500 MG PO TABS
1000.0000 mg | ORAL_TABLET | Freq: Once | ORAL | Status: AC
  Administered 2024-07-17: 1000 mg via ORAL
  Filled 2024-07-17: qty 2

## 2024-07-17 MED ORDER — METHOCARBAMOL 500 MG PO TABS: 500.0000 mg | ORAL_TABLET | Freq: Two times a day (BID) | ORAL | 0 refills | Status: AC

## 2024-07-17 NOTE — Telephone Encounter (Signed)
 Copied from CRM [id number]. Topic: Clinical - Request for Lab/Test Order >> Jul 17, 2024  9:25 AM Lonell PEDLAR wrote: Reason for CRM: Patient is requesting MRI for muscle spasm, trouble walking, right side is weak

## 2024-07-17 NOTE — ED Provider Notes (Signed)
 [facility] EMERGENCY DEPARTMENT AT [facility] Provider Note   CSN: [id number] Arrival date & time: 07/17/24  1013     Patient presents with: Numbness (Right)   [name] is a 63 y.o. female.  Patient is a 63 year old female with a history of previous breast cancer, anxiety, chronic lumbar radiculopathy, and previous right upper arm pain who presents to the ED for increasing right arm pain and right leg pain for the past 3 weeks.  Denies new fall or injury.  Notes she has been to urgent care several times.  She states she had an x-ray of her arm and has been taking medications but no symptoms have improved.  Notes she has tried to take ibuprofen  800 mg, Flexeril , gabapentin , and Norco with minimal relief.  She states she has seen a spine doctor previously at [facility] and has been in physical therapy.  She is also working with her primary care doctor to get a second opinion from a spine doctor in [place].  She notes she has not seen the specialist yet.  She believes that she was referred to pain management but has not heard from this office yet either.  Denies headache, dizziness, chest pain, shortness of breath, abdominal pain, nausea/vomiting/diarrhea, syncope, weakness.      Prior to Admission medications   Medication Sig Start Date End Date Taking? Authorizing Provider  methocarbamol  (ROBAXIN ) 500 MG tablet Take 1 tablet (500 mg total) by mouth 2 (two) times daily for 10 days. 07/17/24 07/27/24 Yes Yaden Seith, Thersia RAMAN, PA-C  ALPRAZolam  (XANAX ) 0.5 MG tablet Take 0.5 mg by mouth 3 (three) times daily as needed for anxiety.    [provider]  bethanechol  (URECHOLINE ) 5 MG tablet Take 5 mg by mouth 3 (three) times daily. Patient not taking: Reported on 07/05/2024    [provider]  busPIRone  (BUSPAR ) 5 MG tablet Take 1 tablet (5 mg total) by mouth 2 (two) times daily. Patient not taking: Reported on 07/05/2024 05/28/24   Aletha Bene, MD  cetirizine   (ZYRTEC ) 10 MG tablet Take 1 tablet (10 mg total) by mouth daily. Patient not taking: Reported on 06/17/2024 12/02/23   Leath-Warren, Etta PARAS, NP  cholecalciferol (GNP VITAMIN D3) 10 MCG (400 UNIT) TABS tablet Take 400 Units by mouth in the morning. Patient not taking: Reported on 07/05/2024    [provider]  cyclobenzaprine  (FLEXERIL ) 10 MG tablet Take 1 tablet (10 mg total) by mouth 3 (three) times daily as needed for muscle spasms. 05/28/24   Aletha Bene, MD  escitalopram  (LEXAPRO ) 20 MG tablet Take 20 mg by mouth daily as needed. Patient not taking: Reported on 07/05/2024    [provider]  fluticasone  (FLONASE ) 50 MCG/ACT nasal spray Place 2 sprays into both nostrils daily. Patient not taking: Reported on 07/05/2024 12/02/23   Leath-Warren, Etta PARAS, NP  HYDROcodone -acetaminophen  (NORCO) 10-325 MG tablet Take 1 tablet by mouth every 6 (six) hours as needed. 07/12/24   Aletha Bene, MD  HYDROcodone -acetaminophen  (NORCO) 10-325 MG tablet TAKE ONE TABLET BY MOUTH EVERY 6 HOURS AS NEEDED. 07/02/24   Aletha Bene, MD  hydrOXYzine  (ATARAX ) 25 MG tablet Take 25 mg by mouth every 6 (six) hours as needed for anxiety or itching. 03/11/24   [provider]  ibuprofen  (ADVIL ) 800 MG tablet Take 1 tablet (800 mg total) by mouth every 8 (eight) hours as needed for moderate pain (pain score 4-6). 07/05/24   Aguiar, Rafaela, MD  iron polysaccharides (NIFEREX) 150 MG capsule Take  150 mg by mouth daily as needed (low iron).    [provider]  lidocaine  (LIDODERM ) 5 % Place 1 patch onto the skin daily. Remove & Discard patch within 12 hours or as directed by MD 07/12/24   Chandra Harlene LABOR, NP  pregabalin  (LYRICA ) 100 MG capsule Take 1 capsule (100 mg total) by mouth 2 (two) times daily. Patient not taking: Reported on 07/05/2024 06/17/24   Aletha Bene, MD  pregabalin  (LYRICA ) 50 MG capsule Take 1 capsule (50 mg total) by mouth 2 (two) times daily. Patient  not taking: Reported on 07/05/2024 05/28/24   Aguiar, Rafaela, MD  rosuvastatin (CRESTOR) 10 MG tablet Take 10 mg by mouth daily. Patient not taking: Reported on 07/01/2024 04/22/24   [provider]  tiZANidine  (ZANAFLEX ) 4 MG tablet Take 4 mg by mouth 3 (three) times daily as needed. 06/26/24   [provider]    Allergies: Patient has no known allergies.    Review of Systems  Constitutional:  Negative for chills and fever.  Respiratory:  Negative for shortness of breath.   Cardiovascular:  Negative for chest pain.  Gastrointestinal:  Negative for nausea and vomiting.  Musculoskeletal:  Positive for arthralgias and back pain. Negative for joint swelling and neck pain.  Neurological:  Negative for dizziness, weakness, numbness and headaches.  All other systems reviewed and are negative.   Updated Vital Signs BP 134/77 (BP Location: Left Arm)   Pulse 78   Temp 98.1 F (36.7 C) (Oral)   Resp 16   Ht 5' 7 (1.702 m)   Wt 71.7 kg   LMP 12/06/2013   SpO2 98%   BMI 24.75 kg/m   Physical Exam Constitutional:      Appearance: Normal appearance.  HENT:     Head: Normocephalic and atraumatic.     Nose: Nose normal.     Mouth/Throat:     Mouth: Mucous membranes are moist.     Pharynx: Oropharynx is clear.  Cardiovascular:     Rate and Rhythm: Normal rate.  Pulmonary:     Effort: Pulmonary effort is normal.  Musculoskeletal:        General: Normal range of motion.     Comments: Full range of motion of all 4 extremities with equal strength on handgrip bilaterally.  Normal dorsiflexion/plantarflexion bilaterally.  No acute weakness noted on the right side compared to the left.  Tender to palpation on the right posterior upper arm.  No deformities erythema or edema.  Radial and PT pulses 2+ bilaterally  Skin:    General: Skin is warm and dry.  Neurological:     Mental Status: She is alert and oriented to person, place, and time.  Psychiatric:        Mood and  Affect: Mood normal.        Behavior: Behavior normal.     (all labs ordered are listed, but only abnormal results are displayed) Labs Reviewed  CBC WITH DIFFERENTIAL/PLATELET - Abnormal; Notable for the following components:      Result Value   WBC 2.8 (*)    RBC 3.48 (*)    Hemoglobin 10.7 (*)    HCT 33.1 (*)    Neutro Abs 1.5 (*)    All other components within normal limits  COMPREHENSIVE METABOLIC PANEL WITH GFR  MAGNESIUM     EKG: EKG Interpretation Date/Time:  Wednesday July 17 2024 11:20:57 EST Ventricular Rate:  80 PR Interval:  162 QRS Duration:  88 QT Interval:  399  QTC Calculation: 461 R Axis:   69  Text Interpretation: Sinus rhythm Probable left atrial enlargement Confirmed by Franklyn Gills [phone]) on 07/17/2024 12:39:59 PM  Radiology: MR Lumbar Spine W Wo Contrast Result Date: 07/17/2024 EXAM: MRI LUMBAR SPINE WITH AND WITHOUT INTRAVENOUS CONTRAST 07/17/2024 01:12:03 PM TECHNIQUE: Multiplanar multisequence MRI of the lumbar spine was performed with and without the administration of intravenous contrast. COMPARISON: Previous lumbar MRI 02/16/2024. Lumbar radiograph 03/01/2022. CT abdomen and pelvis 06/20/2023. CLINICAL HISTORY: Low back pain, 63 year old female FINDINGS: BONES AND ALIGNMENT: Normal alignment. Normal vertebral body heights. Bone marrow signal is unremarkable. Chronic anterior interbody fusion at L4-L5. No convincing solid arthrodesis by CT last year. Mild hardware susceptibility artifact. Maintained vertebral height. Mostly chronic degenerative endplate spurring and signal changes in the lumbar spine, no convincing marrow edema. Intact visible sacrum and SI joints. Normal lumbar segmentation on the CT last year. Same numbering system used on the MRI earlier this year. SPINAL CORD: The conus terminates normally at L1. Capacious lumbar spinal canal. No abnormality intradural enhancement. No dural thickening. SOFT TISSUES: Postoperative changes to the  lumbar paraspinal soft tissue with no adverse features. DEGENERATIVE: T12-L1: Negative disc. Mild posterior element hypertrophy greater on the right. No stenosis. L1-L2: Negative disc. Mild to moderate posterior element hypertrophy. No stenosis. L2-L3: Disc desiccation and disc space loss. Circumferential disc or disc osteophyte complex. Up to moderate posterior element hypertrophy and status post previous left laminectomy at this level. No spinal stenosis. Mild right lateral recess stenosis (descending right L3 nerve level series 8 image 17). Mild left and moderate right L2 neural foraminal stenosis. This level is stable. L3-L4: Disc space loss and circumferential disc bulge. Mild to moderate facet and ligamentum flavum hypertrophy. No spinal or lateral recess stenosis. Mild to moderate left and moderate right L3 neural foraminal stenosis (series 5 image 5 on the right). This level is stable. L4-L5: Chronic decompression and fusion. Mild residual posterior element hypertrophy. Mild foraminal endplate spurring on the left. No convincing stenosis. L5-S1: Advanced chronic disc space loss. Evidence of vacuum disc. Circumferential disc osteophyte complex. Mild posterior element hypertrophy. No spinal or lateral recess stenosis. Moderate left and mild right L5 neural foraminal stenosis. This level is stable. IMPRESSION: 1. No acute finding. Chronic anterior interbody fusion at L4-L5. Previous left laminectomy at L2 L3. 2. Other Lumbar spine degeneration without spinal stenosis. Stable up to moderate multilevel lumbar neural foraminal stenosis, and stable mild right lateral recess stenosis at L2-L3 since June MRI. Electronically signed by: Helayne Hurst MD 07/17/2024 01:40 PM EST RP Workstation: [id number]   MR Cervical Spine Wo Contrast Result Date: 07/17/2024 EXAM: MRI CERVICAL SPINE WITHOUT CONTRAST 07/17/2024 01:12:03 PM TECHNIQUE: Multiplanar multisequence MRI of the cervical spine was performed. COMPARISON:  Cervical spine radiographs 07/05/2024. Postoperative CT of the cervical spine 12/06/2015. CLINICAL HISTORY: 63 year old female. Cervical radiculopathy, no red flags. FINDINGS: BONES AND ALIGNMENT: Normal alignment. Normal vertebral body heights. Bone marrow signal is unremarkable. No marrow edema identified. Previous cervical spine partial decompression and fusion. Chronic C4, C5, and partial C6 posterior decompression. Bilateral posterior laminar fusion hardware at those levels. Chronic ACDF, with C5 and C6 hardware anterior hardware remaining in place. Solid appearing postoperative arthrodesis from C4 to C6 on the previous cervical spine CT. Stable vertebral height and alignment since that time. Mild hardware susceptibility artifact. SPINAL CORD: Mild spinal cord volume loss and subtle abnormal central increased spinal cord signal abnormality at the C4 cervical level (series 5 image 8  and series 8 image 15) compatible with myelomalacia. Elsewhere the visible spinal cord signal and morphology are within normal limits. Negative cervicomedullary junction. Negative visible posterior fossa. Preserved major vascular flow voids in the bilateral neck. Negative visible thoracic inlet. SOFT TISSUES: No paraspinal mass. DEGENERATIVE: C2-C3: Mild degeneration for age, mostly ligamentum flavum hypertrophy. No stenosis. C3-C4: Disc bulge with broad based posterior midline disc protrusion on series 8 image 12. Mild to moderate posterior element hypertrophy. Mild spinal stenosis. No convincing spinal cord signal abnormality. Mild right C4 neural foraminal stenosis. C4-C5: Prior decompression and fusion. Spinal cord myelomalacia but no other adverse features. C5-C6: Prior decompression and fusion. No adverse features. C6-C7: Circumferential disc bulging and endplate spurring. Mild posterior element hypertrophy. No stenosis. C7-T1: Circumferential disc bulge and endplate spurring with broad based bilateral foraminal involvement.  Mild posterior element hypertrophy. No spinal stenosis but moderate bilateral C8 neural foraminal stenosis (moderate to severe on the left side series 5 image 12). Visible upper thoracic spine degeneration without spinal stenosis. IMPRESSION: 1. Previous cervical spine decompression and fusion C4 through C6. Spinal cord myelomalacia at C4 with mild cord volume loss. No other adverse features. 2. C7-T1 disc and endplate degeneration affecting the neural foramina. Moderate to severe left and moderate right foraminal stenosis. Query C8 radiculitis. 3. Adjacent segment disease at both C3-C4 and C6-C7. Mild spinal stenosis at the former. Electronically signed by: Helayne Hurst MD 07/17/2024 01:33 PM EST RP Workstation: [id number]      Medications Ordered in the ED  lidocaine  (LIDODERM ) 5 % 2 patch (2 patches Transdermal Patch Applied 07/17/24 1351)  ketorolac  (TORADOL ) 15 MG/ML injection 15 mg (15 mg Intravenous Given 07/17/24 1123)  acetaminophen  (TYLENOL ) tablet 1,000 mg (1,000 mg Oral Given 07/17/24 1124)  gadobutrol  (GADAVIST ) 1 MMOL/ML injection 7 mL (7 mLs Intravenous Contrast Given 07/17/24 1236)  oxyCODONE  (Oxy IR/ROXICODONE ) immediate release tablet 5 mg (5 mg Oral Given 07/17/24 1351)  methocarbamol  (ROBAXIN ) tablet 500 mg (500 mg Oral Given 07/17/24 1351)                                   Medical Decision Making Amount and/or Complexity of Data Reviewed Labs: ordered. Radiology: ordered.  Risk OTC drugs. Prescription drug management.   Patient is a 63 year old female with a history of previous breast cancer, lumbar pain and upper arm pain who presents to the ED for increasing pain and arm pain and right leg pain for the past 3 weeks.  Please see detailed HPI above.  She has been evaluated at urgent care 3 times previously but states her current medications are not working.  Patient noted to have been taking Flexeril , Norco, ibuprofen , and gabapentin  with minimal relief.  On exam  patient is alert and in no acute distress.  Physical exam as noted above.  Neurovascularly intact and can ambulate with no acute deficits or strength abnormalities noted.  CBC and CMP similar to baseline.  Magnesium  unremarkable.  MRI of the cervical and lumbar spine were obtained due to patient stating she is having difficulty getting an appointment with spine surgery.  Less concerns for cauda equina or spinal epidural abscess due to chronicity of symptoms and no red flag symptoms today.  MRI today reviewed and does appear to show similar chronic changes with no acute abnormalities.  No acute concerns for emergent process or fracture at this time.  Patient given Robaxin , oxycodone , Tylenol , Toradol  and lidocaine  patch  while in ED with improvement in pain.  She is able to ambulate and vital signs are stable.  Stable for discharge home.  Prescribed Robaxin .  Symptomatic care discussed.  Advised to follow-up with neurosurgery for further evaluation and management if continued radiculopathy pain.  Advised to follow-up with PCP or pain management for further pain medications as needed.  Return precautions provided for worsening symptoms.  I discussed this case with my attending physician who cosigned this note including patient's presenting symptoms, physical exam, and planned diagnostics and interventions. Attending physician stated agreement with plan or made changes to plan which were implemented.   Attending physician assessed patient at bedside.   Final diagnoses:  Cervical radiculopathy  Lumbar radiculopathy    ED Discharge Orders          Ordered    methocarbamol  (ROBAXIN ) 500 MG tablet  2 times daily        07/17/24 1525               Neysa Thersia RAMAN, [place] 07/17/24 1804    Franklyn Sid SAILOR, MD 07/24/24 2202

## 2024-07-17 NOTE — Telephone Encounter (Signed)
 FYI Only or Action Required?: Action required by provider: request for appointment and referral request.  Patient was last seen in primary care on 07/05/2024 by Aletha Bene, MD.  Called Nurse Triage reporting No chief complaint on file..  Symptoms began several days ago.  Interventions attempted: Prescription medications: muscle relaxer.  Symptoms are: gradually worsening.  Triage Disposition: See HCP Within 4 Hours (Or PCP Triage)  Patient/caregiver understands and will follow disposition?: Yes   Copied from CRM [id number]. Topic: Clinical - Red Word Triage >> Jul 17, 2024  9:26 AM Lonell PEDLAR wrote: Red Word that prompted transfer to Nurse Triage: Shooting pain in right arm, muscle spasms, weakness Reason for Disposition  [1] Tingling (e.g., pins and needles) of the face, arm / hand, or leg / foot on one side of the body AND [2] present now  (Exceptions: Chronic/recurrent symptom lasting > 4 weeks; or from known cause, such as: bumped elbow, carpal tunnel, pinched nerve.)  Answer Assessment - Initial Assessment Questions No available appts with pcp. Requests appt with pcp and MRI referral.  Advised ED. Pt's husband reports will take to [facility] ED.  1. SYMPTOM: What is the main symptom you are concerned about? (e.g., weakness, numbness)     Reports having muscle spasms going down right arm, denies weakness/ numbness; too painful; severe pain, throbbing 10/10. Right arm pain goes down to right leg tingling;  Denies HA, blurred vision, talking/ walking, sob, chest pain  2. ONSET: When did this start? (e.g., minutes, hours, days; while sleeping)     3 weeks, constantly increasing, been up throughout night  4. PATTERN Does this come and go, or has it been constant since it started?  Is it present now?     constant 5. CARDIAC SYMPTOMS: Have you had any of the following symptoms: chest pain, difficulty breathing, palpitations?     no 6. NEUROLOGIC SYMPTOMS: Have you had  any of the following symptoms: headache, dizziness, vision loss, double vision, changes in speech, unsteady on your feet?     no 7. OTHER SYMPTOMS: Do you have any other symptoms? no  Protocols used: Neurologic Deficit-A-AH

## 2024-07-17 NOTE — Telephone Encounter (Signed)
Pt currently at [facility] ED

## 2024-07-17 NOTE — ED Triage Notes (Signed)
 Pt presents with right arm pain and tingling in right toes for 3 weeks, per pt she is not diabetic.

## 2024-07-17 NOTE — Discharge Instructions (Addendum)
 May take Robaxin  twice a day as needed for pain/muscle spasms.  Do not take Flexeril  at the same time.  Continue other pain medications as previously prescribed.  Please follow-up with spine doctor for further evaluation management of continued pain.  Follow-up with primary care or pain management as needed for continued pain medications.  Return to ED sooner if any symptoms worsen including severe control pain, new fevers, uncontrolled nausea/vomiting, inability to walk, incontinence, numbness/tingling/weakness.

## 2024-07-18 ENCOUNTER — Other Ambulatory Visit (HOSPITAL_COMMUNITY): Payer: Self-pay

## 2024-07-18 NOTE — Telephone Encounter (Signed)
 Pharmacy Patient Advocate Encounter  Received notification from [facility] [employer] that Prior Authorization for Lidocaine  5% patches has been DENIED.  Full denial letter has been uploaded to media tab.   PA #/Case ID/Reference #: [id number]

## 2024-07-24 ENCOUNTER — Ambulatory Visit (INDEPENDENT_AMBULATORY_CARE_PROVIDER_SITE_OTHER)

## 2024-07-24 VITALS — Ht 67.0 in | Wt 158.0 lb

## 2024-07-24 DIAGNOSIS — Z0000 Encounter for general adult medical examination without abnormal findings: Secondary | ICD-10-CM

## 2024-07-24 NOTE — Progress Notes (Signed)
 Chief Complaint  Patient presents with   Medicare Wellness     Subjective:   [name] is a 63 y.o. female who presents for a Medicare Annual Wellness Visit.  Allergies (verified) Patient has no known allergies.   History: Past Medical History:  Diagnosis Date   Anemia    Arthritis    Breast cancer (HCC)    right breast cancer 5 yrs ago   Congenital malrotation of intestine (HCC) 07/2018   APPENDIX IN LUQ   Depression    Dysrhythmia    hx palpitations-too much caffiene   Headache    patient denies this dx   Scoliosis    Past Surgical History:  Procedure Laterality Date   ABDOMINAL EXPOSURE N/A 08/25/2022   Procedure: ABDOMINAL EXPOSURE;  Surgeon: Gretta Lonni PARAS, MD;  Location: [facility] OR;  Service: Vascular;  Laterality: N/A;   ANKLE SURGERY     left ankle cyst   ANTERIOR LUMBAR FUSION N/A 08/25/2022   Procedure: ANTERIOR LUMBAR FOUR TO FIVE INTERBODY FUSION, ILIAC CRST BONE GRAFT HARVEST;  Surgeon: Burnetta Aures, MD;  Location: [facility] OR;  Service: Orthopedics;  Laterality: N/A;  4 HRS DR. CLARK TO DO APPROACH 3 C-BED   BACK SURGERY  2000   lumbar disc surgery   BIOPSY  01/07/2020   Procedure: BIOPSY;  Surgeon: Harvey Margo CROME, MD;  [facility];  Service: Endoscopy;;   BIOPSY  02/07/2023   Procedure: BIOPSY;  Surgeon: Cindie Carlin POUR, DO;  [facility];  Service: Endoscopy;;   BREAST EXCISIONAL BIOPSY Left    benign   BREAST SURGERY Left    left partail mastectomy-cyst   CERVICAL DISC SURGERY  1999   anterior and a posterier cerv fusionx2   CESAREAN SECTION     x2   COLONOSCOPY     COLONOSCOPY WITH PROPOFOL  N/A 06/26/2018   Procedure: COLONOSCOPY WITH PROPOFOL ;  Surgeon: Harvey Margo CROME, MD;  [facility];  Service: Endoscopy;  Laterality: N/A;  11:00am   COLONOSCOPY WITH PROPOFOL  N/A 02/07/2023   Procedure: COLONOSCOPY WITH PROPOFOL ;  Surgeon: Cindie Carlin POUR, DO;  [facility];  Service: Endoscopy;  Laterality:  N/A;  115pm, asa 2   DILATION AND CURETTAGE OF UTERUS     DILITATION & CURRETTAGE/HYSTROSCOPY WITH THERMACHOICE ABLATION  07/10/2012   Procedure: DILATATION & CURETTAGE/HYSTEROSCOPY WITH THERMACHOICE ABLATION;  Surgeon: Norleen LULLA Server, MD;  Location: [facility];  Service: Gynecology;  Laterality: N/A;  D5 13ml in, 13 ml out, temp 87degree celcius, total therapy time 16sec   ERCP     ESOPHAGOGASTRODUODENOSCOPY (EGD) WITH PROPOFOL  N/A 01/07/2020   Procedure: ESOPHAGOGASTRODUODENOSCOPY (EGD) WITH PROPOFOL ;  Surgeon: Harvey Margo CROME, MD;  [facility];  Service: Endoscopy;  Laterality: N/A;  2:00pm   ESOPHAGOGASTRODUODENOSCOPY (EGD) WITH PROPOFOL  N/A 02/07/2023   Procedure: ESOPHAGOGASTRODUODENOSCOPY (EGD) WITH PROPOFOL ;  Surgeon: Cindie Carlin POUR, DO;  [facility];  Service: Endoscopy;  Laterality: N/A;   EYE SURGERY     HARVEST BONE GRAFT N/A 08/25/2022   Procedure: HARVEST ILIAC BONE GRAFT;  Surgeon: Burnetta Aures, MD;  Location: [facility] OR;  Service: Orthopedics;  Laterality: N/A;   POLYPECTOMY  06/26/2018   Procedure: POLYPECTOMY;  Surgeon: Harvey Margo CROME, MD;  [facility];  Service: Endoscopy;;  colon   TUBAL LIGATION     with last c-section   Family History  Problem Relation Age of Onset   Hypertension Mother    Colon  cancer Neg Hx    Gastric cancer Neg Hx    Esophageal cancer Neg Hx    Social History   Occupational History   Not on file  Tobacco Use   Smoking status: Never   Smokeless tobacco: Never  Vaping Use   Vaping status: Never Used  Substance and Sexual Activity   Alcohol use: No   Drug use: No   Sexual activity: Yes    Birth control/protection: Surgical, Post-menopausal    Comment: tubal ligation & ablation   Tobacco Counseling Counseling given: Not Answered  SDOH Screenings   Food Insecurity: No Food Insecurity (07/24/2024)  Housing: Low Risk  (07/24/2024)  Transportation Needs: No Transportation Needs (07/24/2024)   Utilities: Not At Risk (07/24/2024)  Alcohol Screen: Low Risk  (08/08/2022)  Depression (PHQ2-9): Low Risk  (07/24/2024)  Financial Resource Strain: Medium Risk (08/08/2022)  Physical Activity: Inactive (07/24/2024)  Social Connections: Moderately Integrated (07/24/2024)  Stress: No Stress Concern Present (07/24/2024)  Tobacco Use: Low Risk  (07/24/2024)  Health Literacy: Adequate Health Literacy (07/24/2024)   See flowsheets for full screening details  Depression Screen PHQ 2 & 9 Depression Scale- Over the past 2 weeks, how often have you been bothered by any of the following problems? Little interest or pleasure in doing things: 0 Feeling down, depressed, or hopeless (PHQ Adolescent also includes...irritable): 0 PHQ-2 Total Score: 0 Trouble falling or staying asleep, or sleeping too much: 0 Feeling tired or having little energy: 0 Poor appetite or overeating (PHQ Adolescent also includes...weight loss): 0 Feeling bad about yourself - or that you are a failure or have let yourself or your family down: 0 Trouble concentrating on things, such as reading the newspaper or watching television (PHQ Adolescent also includes...like school work): 0 Moving or speaking so slowly that other people could have noticed. Or the opposite - being so fidgety or restless that you have been moving around a lot more than usual: 0 Thoughts that you would be better off dead, or of hurting yourself in some way: 0 PHQ-9 Total Score: 0 If you checked off any problems, how difficult have these problems made it for you to do your work, take care of things at home, or get along with other people?: Not difficult at all     Goals Addressed             This Visit's Progress    Decrease pain   On track      Visit info / Clinical Intake: Medicare Wellness Visit Type:: Subsequent Annual Wellness Visit Persons participating in visit:: patient Medicare Wellness Visit Mode:: Telephone If telephone:: video  declined Because this visit was a virtual/telehealth visit:: vitals recorded from last visit If Telephone or Video please confirm:: I connected with the patient using audio enabled telemedicine application and verified that I am speaking with the correct person using two identifiers; I discussed the limitations of evaluation and management by telemedicine; The patient expressed understanding and agreed to proceed Patient Location:: home Provider Location:: office Information given by:: patient Interpreter Needed?: No Pre-visit prep was completed: yes AWV questionnaire completed by patient prior to visit?: no Living arrangements:: lives with spouse/significant other Patient's Overall Health Status Rating: good Typical amount of pain: some Does pain affect daily life?: (!) yes Are you currently prescribed opioids?: no  Dietary Habits and Nutritional Risks How many meals a day?: 3 Eats fruit and vegetables daily?: yes Most meals are obtained by: preparing own meals Diabetic:: no  Functional Status Activities  of Daily Living (to include ambulation/medication): Independent Ambulation: Independent Medication Administration: Independent Home Management: Independent Manage your own finances?: yes Primary transportation is: driving Concerns about vision?: no *vision screening is required for WTM* Concerns about hearing?: no  Fall Screening Falls in the past year?: 1 Number of falls in past year: 0 Was there an injury with Fall?: 0 Fall Risk Category Calculator: 1 Patient Fall Risk Level: Low Fall Risk  Fall Risk Patient at Risk for Falls Due to: Impaired mobility; History of fall(s) Fall risk Follow up: Falls evaluation completed; Education provided; Falls prevention discussed  Home and Transportation Safety: All rugs have non-skid backing?: yes All stairs or steps have railings?: yes Grab bars in the bathtub or shower?: yes Have non-skid surface in bathtub or shower?: yes Good  home lighting?: yes Regular seat belt use?: yes Hospital stays in the last year:: no  Cognitive Assessment Difficulty concentrating, remembering, or making decisions? : no Will 6CIT or Mini Cog be Completed: no 6CIT or Mini Cog Declined: patient alert, oriented, able to answer questions appropriately and recall recent events  Advance Directives (For Healthcare) Does Patient Have a Medical Advance Directive?: No Would patient like information on creating a medical advance directive?: Yes (MAU/Ambulatory/Procedural Areas - Information given)  Reviewed/Updated  Reviewed/Updated: Reviewed All (Medical, Surgical, Family, Medications, Allergies, Care Teams, Patient Goals)        Objective:    Today's Vitals   07/24/24 0909  Weight: 158 lb (71.7 kg)  Height: 5' 7 (1.702 m)   Body mass index is 24.75 kg/m.  Current Medications (verified) Outpatient Encounter Medications as of 07/24/2024  Medication Sig   cyclobenzaprine  (FLEXERIL ) 10 MG tablet Take 1 tablet (10 mg total) by mouth 3 (three) times daily as needed for muscle spasms.   escitalopram  (LEXAPRO ) 20 MG tablet Take 20 mg by mouth daily as needed.   gabapentin  (NEURONTIN ) 300 MG capsule Take 300 mg by mouth 3 (three) times daily.   ibuprofen  (ADVIL ) 800 MG tablet Take 1 tablet (800 mg total) by mouth every 8 (eight) hours as needed for moderate pain (pain score 4-6).   ALPRAZolam  (XANAX ) 0.5 MG tablet Take 0.5 mg by mouth 3 (three) times daily as needed for anxiety. (Patient not taking: Reported on 07/24/2024)   bethanechol  (URECHOLINE ) 5 MG tablet Take 5 mg by mouth 3 (three) times daily. (Patient not taking: Reported on 07/24/2024)   busPIRone  (BUSPAR ) 5 MG tablet Take 1 tablet (5 mg total) by mouth 2 (two) times daily. (Patient not taking: Reported on 07/24/2024)   cetirizine  (ZYRTEC ) 10 MG tablet Take 1 tablet (10 mg total) by mouth daily. (Patient not taking: Reported on 07/24/2024)   cholecalciferol (GNP VITAMIN D3) 10  MCG (400 UNIT) TABS tablet Take 400 Units by mouth in the morning. (Patient not taking: Reported on 07/24/2024)   fluticasone  (FLONASE ) 50 MCG/ACT nasal spray Place 2 sprays into both nostrils daily. (Patient not taking: Reported on 07/24/2024)   HYDROcodone -acetaminophen  (NORCO) 10-325 MG tablet Take 1 tablet by mouth every 6 (six) hours as needed. (Patient not taking: Reported on 07/24/2024)   HYDROcodone -acetaminophen  (NORCO) 10-325 MG tablet TAKE ONE TABLET BY MOUTH EVERY 6 HOURS AS NEEDED. (Patient not taking: Reported on 07/24/2024)   hydrOXYzine  (ATARAX ) 25 MG tablet Take 25 mg by mouth every 6 (six) hours as needed for anxiety or itching. (Patient not taking: Reported on 07/24/2024)   iron polysaccharides (NIFEREX) 150 MG capsule Take 150 mg by mouth daily as needed (low iron). (Patient  not taking: Reported on 07/24/2024)   lidocaine  (LIDODERM ) 5 % Place 1 patch onto the skin daily. Remove & Discard patch within 12 hours or as directed by MD (Patient not taking: Reported on 07/24/2024)   methocarbamol  (ROBAXIN ) 500 MG tablet Take 1 tablet (500 mg total) by mouth 2 (two) times daily for 10 days. (Patient not taking: Reported on 07/24/2024)   pregabalin  (LYRICA ) 100 MG capsule Take 1 capsule (100 mg total) by mouth 2 (two) times daily. (Patient not taking: Reported on 07/24/2024)   pregabalin  (LYRICA ) 50 MG capsule Take 1 capsule (50 mg total) by mouth 2 (two) times daily. (Patient not taking: Reported on 07/24/2024)   rosuvastatin (CRESTOR) 10 MG tablet Take 10 mg by mouth daily. (Patient not taking: Reported on 07/24/2024)   tiZANidine  (ZANAFLEX ) 4 MG tablet Take 4 mg by mouth 3 (three) times daily as needed. (Patient not taking: Reported on 07/24/2024)   No facility-administered encounter medications on file as of 07/24/2024.   Hearing/Vision screen Hearing Screening - Comments:: Patient is able to hear conversational tones without difficulty. No issues reported.   Vision Screening -  Comments:: Wears rx glasses - up to date with routine eye exams with [facility]   Immunizations and Health Maintenance Health Maintenance  Topic Date Due   COVID-19 Vaccine (1) Never done   HIV Screening  Never done   Hepatitis C Screening  Never done   DTaP/Tdap/Td (1 - Tdap) Never done   Zoster Vaccines- Shingrix  (2 of 2) 08/12/2024   Mammogram  11/16/2024   Cervical Cancer Screening (HPV/Pap Cotest)  06/18/2025   Medicare Annual Wellness (AWV)  07/24/2025   Colonoscopy  02/06/2033   Pneumococcal Vaccine: 50+ Years  Completed   Influenza Vaccine  Completed   Hepatitis B Vaccines 19-59 Average Risk  Aged Out   HPV VACCINES  Aged Out   Meningococcal B Vaccine  Aged Out        Assessment/Plan:  This is a routine wellness examination for [place].  Patient Care Team: Aletha Bene, MD as PCP - General (Family Medicine) Cindie Carlin POUR, DO as Consulting Physician (Gastroenterology[facility], [facility]   I have personally reviewed and noted the following in the patient's chart:   Medical and social history Use of alcohol, tobacco or illicit drugs  Current medications and supplements including opioid prescriptions. Functional ability and status Nutritional status Physical activity Advanced directives List of other physicians Hospitalizations, surgeries, and ER visits in previous 12 months Vitals Screenings to include cognitive, depression, and falls Referrals and appointments  No orders of the defined types were placed in this encounter.  In addition, I have reviewed and discussed with patient certain preventive protocols, quality metrics, and best practice recommendations. A written personalized care plan for preventive services as well as general preventive health recommendations were provided to patient.   Lavelle Charmaine Browner, LPN   [id number]/[id number]   Return in 1 year (on 07/24/2025).  After Visit Summary: (MyChart) Due to this being a  telephonic visit, the after visit summary with patients personalized plan was offered to patient via MyChart   Nurse Notes: Patient states that she continues to have muscle spasms; has only been taking Flexeril  once daily.  Advised to try to increase dose to see if it is helpful.

## 2024-07-24 NOTE — Patient Instructions (Signed)
 Ms. Mohon,  Thank you for taking the time for your Medicare Wellness Visit. I appreciate your continued commitment to your health goals. Please review the care plan we discussed, and feel free to reach out if I can assist you further.  Please note that Annual Wellness Visits do not include a physical exam. Some assessments may be limited, especially if the visit was conducted virtually. If needed, we may recommend an in-person follow-up with your provider.  Ongoing Care Seeing your primary care provider every 3 to 6 months helps us  monitor your health and provide consistent, personalized care.   Referrals If a referral was made during today's visit and you haven't received any updates within two weeks, please contact the referred provider directly to check on the status.  Recommended Screenings:  Health Maintenance  Topic Date Due   COVID-19 Vaccine (1) Never done   HIV Screening  Never done   Hepatitis C Screening  Never done   DTaP/Tdap/Td vaccine (1 - Tdap) Never done   Zoster (Shingles) Vaccine (2 of 2) 08/12/2024   Breast Cancer Screening  11/16/2024   Pap with HPV screening  06/18/2025   Medicare Annual Wellness Visit  07/24/2025   Colon Cancer Screening  02/06/2033   Pneumococcal Vaccine for age over 31  Completed   Flu Shot  Completed   Hepatitis B Vaccine  Aged Out   HPV Vaccine  Aged Out   Meningitis B Vaccine  Aged Out       07/24/2024    9:10 AM  Advanced Directives  Does Patient Have a Medical Advance Directive? No  Would patient like information on creating a medical advance directive? Yes (MAU/Ambulatory/Procedural Areas - Information given)   Information on Advanced Care Planning can be found at [place]  Secretary of [facility] Advance Health Care Directives Advance Health Care Directives ([email or web address]   Vision: Annual vision screenings are recommended for early detection of glaucoma, cataracts, and diabetic retinopathy. These exams can also reveal signs of  chronic conditions such as diabetes and high blood pressure.  Dental: Annual dental screenings help detect early signs of oral cancer, gum disease, and other conditions linked to overall health, including heart disease and diabetes.  Please see the attached documents for additional preventive care recommendations.

## 2024-07-29 ENCOUNTER — Ambulatory Visit: Payer: Self-pay

## 2024-07-29 NOTE — Telephone Encounter (Signed)
 Called [facility] and advised them of ER refusal and patient's symptoms/request for steroid medication if possible

## 2024-07-29 NOTE — Telephone Encounter (Addendum)
 Husband states best to call back at [phone]  FYI Only or Action Required?: Action required by provider: clinical question for provider, update on patient condition, and requesting steroid medication.  Patient was last seen in primary care on 07/05/2024 by Aletha Bene, MD.  Called Nurse Triage reporting Arm Pain.  Symptoms began about a month ago.  Interventions attempted: OTC medications: ibuprofen , ointments, Prescription medications: pain medication, flexeril , Rest, hydration, or home remedies, and Ice/heat application.  Symptoms are: rapidly worsening.  Triage Disposition: Go to ED Now (or PCP Triage)  Patient/caregiver understands and will follow disposition?: No, wishes to speak with PCP               Copied from CRM #[id number]. Topic: Clinical - Red Word Triage >> Jul 29, 2024  2:02 PM Delon HERO wrote: Red Word that prompted transfer to Nurse Triage: Patient husband is calling to report that the patient is in a lot of pain requesting a script for steriod pack. Reason for Disposition  [1] SEVERE pain (e.g., excruciating) AND [2] not improved 2 hours after pain medicine  Answer Assessment - Initial Assessment Questions Right arm pain for a month Nothing helping at home Patient doesn't rest well, cries, and cannot lift anything with this arm Denies any injuries Prescription pain medication, Flexeril , and Ibuprofen  dont help at all  They wanted to know the results of the MRIs the patient had at the hospital recently as well. They were never advised of those results  Patient is heard in the background crying in pain Denies any injuries Patient's husband was wondering if steroids would be a good idea for the patient to try at this point  They are advised that with severe pain that has been continuous with no relief like this it is recommended the Emergency Room for further evaluation Husband advised that they have been to urgent cares and the emergency room  and nothing has been found. Pain medications that the patient has been given doesn't help  They are advised to call us  back at any point with any further questions/concerns and also if things get worse to take the patient to the Emergency Room Thy verbalized understanding  Protocols used: Arm Pain-A-AH

## 2024-07-30 ENCOUNTER — Other Ambulatory Visit: Payer: Self-pay

## 2024-07-30 MED ORDER — PREDNISONE 10 MG (21) PO TBPK: ORAL_TABLET | ORAL | 0 refills | Status: DC

## 2024-07-30 NOTE — Telephone Encounter (Signed)
Pt husband informed and verbalized understanding.

## 2024-07-30 NOTE — Progress Notes (Signed)
 Referring Physician:  Aletha Bene, MD [address] [place],  [place] [address]  Primary Physician:  Aletha Bene, MD  History of Present Illness: 08/06/2024 [name] is here today with a chief complaint of right sided neck and right upper extremity pain.  She has a previous history of both anterior and posterior cervical spinal fusion as well as lumbar spinal fusion.  This occurred approximately 2 months ago and has been causing significant pain.  She describes numbness and tingling down her right arm into the top of her hand.  This primarily on the inside of her arm and she also acknowledges significant elbow pain.  She feels as though she is weaker secondary to the pain.  She just finished a steroid Dosepak which has not helped her pain.  She denies any new gait instability or changes to continence.     Bowel/Bladder Dysfunction: none  Conservative measures:  Physical therapy: Has participated in @[facility]  ortho was d/c, 2025 Multimodal medical therapy including regular antiinflammatories:  hydrocodone /acetaminophen , ibuprofen , Celebrex, pregabalin , gabapentin  and Flexeril   Injections:  Dr Bonner did ESI injections (Date?) left L5-S1   Past Surgery: 2000- Lumbar disc surgery 1999- cervical disc surgery x 2   [name] has  symptoms of chronic cervical myelopathy.  The symptoms are causing a significant impact on the patient's life.   Review of Systems:  A 10 point review of systems is negative, except for the pertinent positives and negatives detailed in the HPI.  Past Medical History: Past Medical History:  Diagnosis Date   Anemia    Arthritis    Breast cancer (HCC)    right breast cancer 5 yrs ago   Congenital malrotation of intestine (HCC) 07/2018   APPENDIX IN LUQ   Depression    Dysrhythmia    hx palpitations-too much caffiene   Headache    patient denies this dx   Scoliosis     Past Surgical History: Past Surgical History:  Procedure Laterality  Date   ABDOMINAL EXPOSURE N/A 08/25/2022   Procedure: ABDOMINAL EXPOSURE;  Surgeon: Gretta Lonni PARAS, MD;  Location: [facility] OR;  Service: Vascular;  Laterality: N/A;   ANKLE SURGERY     left ankle cyst   ANTERIOR LUMBAR FUSION N/A 08/25/2022   Procedure: ANTERIOR LUMBAR FOUR TO FIVE INTERBODY FUSION, ILIAC CRST BONE GRAFT HARVEST;  Surgeon: Burnetta Aures, MD;  Location: [facility] OR;  Service: Orthopedics;  Laterality: N/A;  4 HRS DR. CLARK TO DO APPROACH 3 C-BED   BACK SURGERY  2000   lumbar disc surgery   BIOPSY  01/07/2020   Procedure: BIOPSY;  Surgeon: Harvey Margo CROME, MD;  Location: [facility];  Service: Endoscopy;;   BIOPSY  02/07/2023   Procedure: BIOPSY;  Surgeon: Cindie Carlin POUR, DO;  Location: [facility];  Service: Endoscopy;;   BREAST EXCISIONAL BIOPSY Left    benign   BREAST SURGERY Left    left partail mastectomy-cyst   CERVICAL DISC SURGERY  1999   anterior and a posterier cerv fusionx2   CESAREAN SECTION     x2   COLONOSCOPY     COLONOSCOPY WITH PROPOFOL  N/A 06/26/2018   Procedure: COLONOSCOPY WITH PROPOFOL ;  Surgeon: Harvey Margo CROME, MD;  Location: [facility];  Service: Endoscopy;  Laterality: N/A;  11:00am   COLONOSCOPY WITH PROPOFOL  N/A 02/07/2023   Procedure: COLONOSCOPY WITH PROPOFOL ;  Surgeon: Cindie Carlin POUR, DO;  Location: [facility];  Service: Endoscopy;  Laterality: N/A;  115pm, asa 2   DILATION AND CURETTAGE OF UTERUS     DILITATION & CURRETTAGE/HYSTROSCOPY WITH THERMACHOICE ABLATION  07/10/2012   Procedure: DILATATION & CURETTAGE/HYSTEROSCOPY WITH THERMACHOICE ABLATION;  Surgeon: Norleen LULLA Server, MD;  Location: [facility];  Service: Gynecology;  Laterality: N/A;  D5 13ml in, 13 ml out, temp 87degree celcius, total therapy time 16sec   ERCP     ESOPHAGOGASTRODUODENOSCOPY (EGD) WITH PROPOFOL  N/A 01/07/2020   Procedure: ESOPHAGOGASTRODUODENOSCOPY (EGD) WITH PROPOFOL ;  Surgeon: Harvey Margo CROME, MD;  [facility]: [facility];  Service: Endoscopy;   Laterality: N/A;  2:00pm   ESOPHAGOGASTRODUODENOSCOPY (EGD) WITH PROPOFOL  N/A 02/07/2023   Procedure: ESOPHAGOGASTRODUODENOSCOPY (EGD) WITH PROPOFOL ;  Surgeon: Cindie Carlin POUR, DO;  [facility]: [facility];  Service: Endoscopy;  Laterality: N/A;   EYE SURGERY     HARVEST BONE GRAFT N/A 08/25/2022   Procedure: HARVEST ILIAC BONE GRAFT;  Surgeon: Burnetta Aures, MD;  Location: [facility] OR;  Service: Orthopedics;  Laterality: N/A;   POLYPECTOMY  06/26/2018   Procedure: POLYPECTOMY;  Surgeon: Harvey Margo CROME, MD;  Location: [facility];  Service: Endoscopy;;  colon   TUBAL LIGATION     with last c-section    Allergies: Allergies as of 08/06/2024   (No Known Allergies)    Medications: Outpatient Encounter Medications as of 08/06/2024  Medication Sig   ALPRAZolam  (XANAX ) 0.5 MG tablet Take 0.5 mg by mouth 3 (three) times daily as needed for anxiety.   cholecalciferol (GNP VITAMIN D3) 10 MCG (400 UNIT) TABS tablet Take 400 Units by mouth in the morning.   cyclobenzaprine  (FLEXERIL ) 10 MG tablet Take 1 tablet (10 mg total) by mouth 3 (three) times daily as needed for muscle spasms.   escitalopram  (LEXAPRO ) 20 MG tablet Take 20 mg by mouth daily as needed.   hydrOXYzine  (ATARAX ) 25 MG tablet Take 25 mg by mouth every 6 (six) hours as needed for anxiety or itching.   ibuprofen  (ADVIL ) 800 MG tablet Take 1 tablet (800 mg total) by mouth every 8 (eight) hours as needed for moderate pain (pain score 4-6).   iron polysaccharides (NIFEREX) 150 MG capsule Take 150 mg by mouth daily as needed (low iron).   lidocaine  (LIDODERM ) 5 % Place 1 patch onto the skin daily. Remove & Discard patch within 12 hours or as directed by MD   pregabalin  (LYRICA ) 100 MG capsule Take 1 capsule (100 mg total) by mouth 2 (two) times daily.   rosuvastatin (CRESTOR) 10 MG tablet Take 10 mg by mouth daily.   [DISCONTINUED] bethanechol  (URECHOLINE ) 5 MG tablet Take 5 mg by mouth 3 (three) times daily. (Patient not taking: Reported  on 07/24/2024)   [DISCONTINUED] busPIRone  (BUSPAR ) 5 MG tablet Take 1 tablet (5 mg total) by mouth 2 (two) times daily. (Patient not taking: Reported on 07/24/2024)   [DISCONTINUED] cetirizine  (ZYRTEC ) 10 MG tablet Take 1 tablet (10 mg total) by mouth daily. (Patient not taking: Reported on 07/24/2024)   [DISCONTINUED] fluticasone  (FLONASE ) 50 MCG/ACT nasal spray Place 2 sprays into both nostrils daily. (Patient not taking: Reported on 07/24/2024)   [DISCONTINUED] gabapentin  (NEURONTIN ) 300 MG capsule Take 300 mg by mouth 3 (three) times daily.   [DISCONTINUED] HYDROcodone -acetaminophen  (NORCO) 10-325 MG tablet Take 1 tablet by mouth every 6 (six) hours as needed. (Patient not taking: Reported on 07/24/2024)   [DISCONTINUED] HYDROcodone -acetaminophen  (NORCO) 10-325 MG tablet TAKE ONE TABLET BY MOUTH EVERY 6 HOURS AS NEEDED. (Patient not taking: Reported on 07/24/2024)   [DISCONTINUED] predniSONE  (STERAPRED  UNI-PAK 21 TAB) 10 MG (21) TBPK tablet Take with a meal   [DISCONTINUED] pregabalin  (LYRICA ) 50 MG capsule Take 1 capsule (50 mg total) by mouth 2 (two) times daily. (Patient not taking: Reported on 07/24/2024)   [DISCONTINUED] tiZANidine  (ZANAFLEX ) 4 MG tablet Take 4 mg by mouth 3 (three) times daily as needed. (Patient not taking: Reported on 07/24/2024)   No facility-administered encounter medications on file as of 08/06/2024.    Social History: Social History   Tobacco Use   Smoking status: Never   Smokeless tobacco: Never  Vaping Use   Vaping status: Never Used  Substance Use Topics   Alcohol use: No   Drug use: No    Family Medical History: Family History  Problem Relation Age of Onset   Hypertension Mother    Colon cancer Neg Hx    Gastric cancer Neg Hx    Esophageal cancer Neg Hx     Physical Examination: @VITALWITHPAIN @  General: Patient is well developed, well nourished, calm, collected, and in no apparent distress. Attention to examination is  appropriate.  Psychiatric: Patient is non-anxious.  Head:  Pupils equal, round, and reactive to light.  ENT:  Oral mucosa appears well hydrated.  Neck:   Supple.    Respiratory: Patient is breathing without any difficulty.  Extremities: No edema.  Vascular: Palpable dorsal pedal pulses.  Skin:   On exposed skin, there are no abnormal skin lesions.  NEUROLOGICAL:     Awake, alert, oriented to person, place, and time.  Speech is clear and fluent. Fund of knowledge is appropriate.   Cranial Nerves: Pupils equal round and reactive to light.  Facial tone is symmetric.    ROM of spine: Patient is tender to palpation of right cervical paraspinals.  Patient is also tender to her right elbow.  Strength: Side Biceps Triceps Deltoid Interossei Grip Wrist Ext. Wrist Flex.  R 5 4+ 5 5 4+ 5 5  L 5 5 5 5 5 5 5    Patient is reflexes largely 2+ bilateral upper extremities.  Hoffman's is absent-it was difficult to have patient relax.  Gait is normal.   Medical Decision Making  Imaging: MRI CERVICAL SPINE WITHOUT CONTRAST 07/17/2024 01:12:03 PM   TECHNIQUE: Multiplanar multisequence MRI of the cervical spine was performed.   COMPARISON: Cervical spine radiographs 07/05/2024. Postoperative CT of the cervical spine 12/06/2015.   CLINICAL HISTORY: 63 year old female. Cervical radiculopathy, no red flags.   FINDINGS:   BONES AND ALIGNMENT: Normal alignment. Normal vertebral body heights. Bone marrow signal is unremarkable. No marrow edema identified. Previous cervical spine partial decompression and fusion. Chronic C4, C5, and partial C6 posterior decompression. Bilateral posterior laminar fusion hardware at those levels. Chronic ACDF, with C5 and C6 hardware anterior hardware remaining in place. Solid appearing postoperative arthrodesis from C4 to C6 on the previous cervical spine CT. Stable vertebral height and alignment since that time. Mild hardware susceptibility artifact.    SPINAL CORD: Mild spinal cord volume loss and subtle abnormal central increased spinal cord signal abnormality at the C4 cervical level (series 5 image 8 and series 8 image 15) compatible with myelomalacia. Elsewhere the visible spinal cord signal and morphology are within normal limits. Negative cervicomedullary junction. Negative visible posterior fossa. Preserved major vascular flow voids in the bilateral neck. Negative visible thoracic inlet.   SOFT TISSUES: No paraspinal mass.   DEGENERATIVE: C2-C3: Mild degeneration for age, mostly ligamentum flavum hypertrophy. No stenosis. C3-C4: Disc bulge with broad based posterior midline  disc protrusion on series 8 image 12. Mild to moderate posterior element hypertrophy. Mild spinal stenosis. No convincing spinal cord signal abnormality. Mild right C4 neural foraminal stenosis. C4-C5: Prior decompression and fusion. Spinal cord myelomalacia but no other adverse features. C5-C6: Prior decompression and fusion. No adverse features. C6-C7: Circumferential disc bulging and endplate spurring. Mild posterior element hypertrophy. No stenosis. C7-T1: Circumferential disc bulge and endplate spurring with broad based bilateral foraminal involvement. Mild posterior element hypertrophy. No spinal stenosis but moderate bilateral C8 neural foraminal stenosis (moderate to severe on the left side series 5 image 12).   Visible upper thoracic spine degeneration without spinal stenosis.   IMPRESSION: 1. Previous cervical spine decompression and fusion C4 through C6. Spinal cord myelomalacia at C4 with mild cord volume loss. No other adverse features. 2. C7-T1 disc and endplate degeneration affecting the neural foramina. Moderate to severe left and moderate right foraminal stenosis. Query C8 radiculitis. 3. Adjacent segment disease at both C3-C4 and C6-C7. Mild spinal stenosis at the former.  I have personally reviewed the images and agree with  the above interpretation.  Assessment and Plan: Ms. Deignan is a pleasant 63 y.o. female with previous C5-6 ACDF and posterior C4-6 decompression and fusion.  She comes in today for worsening neck pain radiating into her right upper extremity x 2 months.  On her most recent MRI does show myomalacia at C4 as well as moderate to severe stenosis at C7-T1 and bilateral foramina.  She has no significant weakness on examination.  She is very tender to her right upper extremity, especially the elbow as well as her right cervical paraspinals.  Concern for C7 radiculopathy.  Plan for the following:  -Reach out to pain team to try to get her in for an injection as soon as possible - X-rays today to include flexion and extension - Physical therapy referral has been placed - Patient has had an issue with her Lyrica  prescription.  Will plan to restart that for her. - Short dose of oxycodone  was also given prior to her getting an injection due to her severe pain while in clinic today. - Plan to see back in approximately 8 weeks - Advised patient to optimize over-the-counter medications and modalities.  Although I do not think there is a high likelihood of this being an ulnar neuropathy exacerbation, she does have a lot of elbow pain that is worse at night and upon waking.  Advised her that she could trial a elbow brace to try to keep it straight to see if it helps her pain.  Would consider EMG in the future.  Patient advised to reach out if she has any questions or concerns in the meantime.   Thank you for involving me in the care of this patient.     Lyle Decamp, PA-C Dept. of Neurosurgery

## 2024-07-30 NOTE — Telephone Encounter (Signed)
 Pt husband, Garen, informed. They would like the prednisone  sent in and want to know if prednisone  is safe to take with Lyrica ?

## 2024-08-06 ENCOUNTER — Ambulatory Visit

## 2024-08-06 ENCOUNTER — Encounter: Payer: Self-pay | Admitting: Physician Assistant

## 2024-08-06 ENCOUNTER — Ambulatory Visit: Admitting: Physician Assistant

## 2024-08-06 VITALS — BP 128/84 | Ht 67.0 in | Wt 145.5 lb

## 2024-08-06 DIAGNOSIS — G9589 Other specified diseases of spinal cord: Secondary | ICD-10-CM | POA: Diagnosis not present

## 2024-08-06 DIAGNOSIS — M5412 Radiculopathy, cervical region: Secondary | ICD-10-CM

## 2024-08-06 DIAGNOSIS — Z4789 Encounter for other orthopedic aftercare: Secondary | ICD-10-CM | POA: Diagnosis not present

## 2024-08-06 DIAGNOSIS — M4802 Spinal stenosis, cervical region: Secondary | ICD-10-CM

## 2024-08-06 DIAGNOSIS — M47812 Spondylosis without myelopathy or radiculopathy, cervical region: Secondary | ICD-10-CM | POA: Diagnosis not present

## 2024-08-06 DIAGNOSIS — M50223 Other cervical disc displacement at C6-C7 level: Secondary | ICD-10-CM | POA: Diagnosis not present

## 2024-08-06 DIAGNOSIS — M5416 Radiculopathy, lumbar region: Secondary | ICD-10-CM

## 2024-08-06 DIAGNOSIS — M5021 Other cervical disc displacement,  high cervical region: Secondary | ICD-10-CM | POA: Diagnosis not present

## 2024-08-06 DIAGNOSIS — G8929 Other chronic pain: Secondary | ICD-10-CM

## 2024-08-06 MED ORDER — PREGABALIN 100 MG PO CAPS: 100.0000 mg | ORAL_CAPSULE | Freq: Two times a day (BID) | ORAL | 0 refills | Status: AC

## 2024-08-06 MED ORDER — OXYCODONE HCL 5 MG PO TABS: 5.0000 mg | ORAL_TABLET | Freq: Three times a day (TID) | ORAL | 0 refills | Status: AC | PRN

## 2024-08-12 ENCOUNTER — Encounter: Admitting: Physical Medicine and Rehabilitation

## 2024-08-16 NOTE — Progress Notes (Unsigned)
° °  08/16/2024  Patient ID: [name], female   DOB: 08-15-1961, 63 y.o.   MRN: [id number]  Pharmacy Quality Measure Review  This patient is appearing on a report for being at risk of failing the adherence measure for cholesterol (statin) medications this calendar year.   Medication: rosuvastatin Last fill date: 04/22/24 for 90 day supply  Missattributed, excel sheet updated  Lang Sieve, PharmD, BCGP Clinical Pharmacist  [phone]

## 2024-09-11 ENCOUNTER — Ambulatory Visit: Admitting: Student in an Organized Health Care Education/Training Program

## 2024-09-12 ENCOUNTER — Telehealth: Payer: Self-pay | Admitting: Family Medicine

## 2024-09-12 ENCOUNTER — Other Ambulatory Visit: Payer: Self-pay

## 2024-09-12 DIAGNOSIS — G894 Chronic pain syndrome: Secondary | ICD-10-CM

## 2024-09-12 DIAGNOSIS — M542 Cervicalgia: Secondary | ICD-10-CM

## 2024-09-12 MED ORDER — IBUPROFEN 800 MG PO TABS: 800.0000 mg | ORAL_TABLET | Freq: Three times a day (TID) | ORAL | 0 refills | Status: AC | PRN

## 2024-09-12 NOTE — Telephone Encounter (Signed)
 Prescription Request  09/12/2024  LOV: 07/05/2024  What is the name of the medication or equipment?   ibuprofen  (ADVIL ) 800 MG tablet  **90 day script requested**  Have you contacted your pharmacy to request a refill? Yes   Which pharmacy would you like this sent to?  [facility] [place], [place] - [id number] [place] [address] Phone: [phone] Fax: [phone]    Patient notified that their request is being sent to the clinical staff for review and that they should receive a response within 2 business days.   Please advise pharmacist.

## 2024-09-12 NOTE — Telephone Encounter (Signed)
 Sent in medication

## 2024-09-19 ENCOUNTER — Other Ambulatory Visit: Payer: Self-pay | Admitting: Family Medicine

## 2024-09-20 NOTE — Telephone Encounter (Signed)
Left vm for pt to return call to schedule an appt
